# Patient Record
Sex: Male | Born: 1950 | Race: Black or African American | Hispanic: No | Marital: Single | Smoking: Current every day smoker
Health system: Southern US, Community
[De-identification: ages and names within clinical notes are randomized; demographics above are authoritative.]

## PROBLEM LIST (undated history)

## (undated) DIAGNOSIS — N189 Chronic kidney disease, unspecified: Secondary | ICD-10-CM

## (undated) DIAGNOSIS — F99 Mental disorder, not otherwise specified: Secondary | ICD-10-CM

## (undated) DIAGNOSIS — C649 Malignant neoplasm of unspecified kidney, except renal pelvis: Secondary | ICD-10-CM

## (undated) DIAGNOSIS — E119 Type 2 diabetes mellitus without complications: Secondary | ICD-10-CM

## (undated) DIAGNOSIS — I10 Essential (primary) hypertension: Secondary | ICD-10-CM

## (undated) DIAGNOSIS — Z972 Presence of dental prosthetic device (complete) (partial): Secondary | ICD-10-CM

## (undated) HISTORY — PX: KNEE SURGERY: SHX244

## (undated) HISTORY — PX: ANKLE FRACTURE SURGERY: SHX122

---

## 2003-09-04 ENCOUNTER — Other Ambulatory Visit: Payer: Self-pay

## 2005-08-27 ENCOUNTER — Emergency Department: Payer: Self-pay | Admitting: Emergency Medicine

## 2009-01-13 ENCOUNTER — Ambulatory Visit: Payer: Self-pay | Admitting: Family Medicine

## 2013-05-06 ENCOUNTER — Emergency Department: Payer: Self-pay | Admitting: Emergency Medicine

## 2013-10-26 ENCOUNTER — Emergency Department: Payer: Self-pay | Admitting: Emergency Medicine

## 2013-10-26 ENCOUNTER — Inpatient Hospital Stay: Admission: AD | Admit: 2013-10-26 | Payer: Self-pay | Source: Ambulatory Visit | Admitting: Emergency Medicine

## 2013-10-26 ENCOUNTER — Inpatient Hospital Stay (HOSPITAL_COMMUNITY)
Admission: EM | Admit: 2013-10-26 | Discharge: 2013-11-06 | DRG: 021 | Disposition: A | Discharge status: Rehabilitation Facility | Payer: Medicare Other | Source: Other Acute Inpatient Hospital | Attending: Neurosurgery | Admitting: Neurosurgery

## 2013-10-26 ENCOUNTER — Encounter (HOSPITAL_COMMUNITY): Payer: Self-pay | Admitting: *Deleted

## 2013-10-26 ENCOUNTER — Inpatient Hospital Stay (HOSPITAL_COMMUNITY): Payer: Medicare Other

## 2013-10-26 DIAGNOSIS — I609 Nontraumatic subarachnoid hemorrhage, unspecified: Principal | ICD-10-CM

## 2013-10-26 DIAGNOSIS — Y849 Medical procedure, unspecified as the cause of abnormal reaction of the patient, or of later complication, without mention of misadventure at the time of the procedure: Secondary | ICD-10-CM | POA: Diagnosis present

## 2013-10-26 DIAGNOSIS — G988 Other disorders of nervous system: Secondary | ICD-10-CM | POA: Diagnosis present

## 2013-10-26 DIAGNOSIS — I10 Essential (primary) hypertension: Secondary | ICD-10-CM | POA: Diagnosis present

## 2013-10-26 DIAGNOSIS — E119 Type 2 diabetes mellitus without complications: Secondary | ICD-10-CM | POA: Diagnosis present

## 2013-10-26 DIAGNOSIS — I671 Cerebral aneurysm, nonruptured: Secondary | ICD-10-CM | POA: Diagnosis present

## 2013-10-26 DIAGNOSIS — I129 Hypertensive chronic kidney disease with stage 1 through stage 4 chronic kidney disease, or unspecified chronic kidney disease: Secondary | ICD-10-CM | POA: Diagnosis present

## 2013-10-26 DIAGNOSIS — R51 Headache: Secondary | ICD-10-CM | POA: Diagnosis present

## 2013-10-26 DIAGNOSIS — R42 Dizziness and giddiness: Secondary | ICD-10-CM | POA: Diagnosis present

## 2013-10-26 DIAGNOSIS — E876 Hypokalemia: Secondary | ICD-10-CM | POA: Diagnosis not present

## 2013-10-26 DIAGNOSIS — R262 Difficulty in walking, not elsewhere classified: Secondary | ICD-10-CM | POA: Diagnosis present

## 2013-10-26 DIAGNOSIS — N189 Chronic kidney disease, unspecified: Secondary | ICD-10-CM | POA: Diagnosis present

## 2013-10-26 DIAGNOSIS — F259 Schizoaffective disorder, unspecified: Secondary | ICD-10-CM | POA: Diagnosis present

## 2013-10-26 DIAGNOSIS — I639 Cerebral infarction, unspecified: Secondary | ICD-10-CM

## 2013-10-26 HISTORY — DX: Mental disorder, not otherwise specified: F99

## 2013-10-26 HISTORY — DX: Type 2 diabetes mellitus without complications: E11.9

## 2013-10-26 HISTORY — DX: Chronic kidney disease, unspecified: N18.9

## 2013-10-26 HISTORY — DX: Essential (primary) hypertension: I10

## 2013-10-26 LAB — COMPREHENSIVE METABOLIC PANEL
ALBUMIN: 3.7 g/dL (ref 3.4–5.0)
ALT: 41 U/L (ref 12–78)
ANION GAP: 8 (ref 7–16)
Alkaline Phosphatase: 67 U/L
BILIRUBIN TOTAL: 0.9 mg/dL (ref 0.2–1.0)
BUN: 26 mg/dL — ABNORMAL HIGH (ref 7–18)
CO2: 31 mmol/L (ref 21–32)
Calcium, Total: 10.2 mg/dL — ABNORMAL HIGH (ref 8.5–10.1)
Chloride: 101 mmol/L (ref 98–107)
Creatinine: 2.64 mg/dL — ABNORMAL HIGH (ref 0.60–1.30)
GFR CALC AF AMER: 29 — AB
GFR CALC NON AF AMER: 25 — AB
GLUCOSE: 125 mg/dL — AB (ref 65–99)
OSMOLALITY: 286 (ref 275–301)
POTASSIUM: 2.7 mmol/L — AB (ref 3.5–5.1)
SGOT(AST): 138 U/L — ABNORMAL HIGH (ref 15–37)
Sodium: 140 mmol/L (ref 136–145)
TOTAL PROTEIN: 7.9 g/dL (ref 6.4–8.2)

## 2013-10-26 LAB — LIPASE, BLOOD: Lipase: 135 U/L (ref 73–393)

## 2013-10-26 LAB — CBC
HCT: 47.9 % (ref 40.0–52.0)
HGB: 15.6 g/dL (ref 13.0–18.0)
MCH: 30.5 pg (ref 26.0–34.0)
MCHC: 32.7 g/dL (ref 32.0–36.0)
MCV: 93 fL (ref 80–100)
Platelet: 292 10*3/uL (ref 150–440)
RBC: 5.13 10*6/uL (ref 4.40–5.90)
RDW: 15.3 % — ABNORMAL HIGH (ref 11.5–14.5)
WBC: 16.1 10*3/uL — AB (ref 3.8–10.6)

## 2013-10-26 LAB — GLUCOSE, CAPILLARY: Glucose-Capillary: 145 mg/dL — ABNORMAL HIGH (ref 70–99)

## 2013-10-26 LAB — MAGNESIUM: Magnesium: 2.1 mg/dL

## 2013-10-26 LAB — PROTIME-INR
INR: 1.1 (ref 0.00–1.49)
PROTHROMBIN TIME: 14 s (ref 11.6–15.2)

## 2013-10-26 LAB — MRSA PCR SCREENING: MRSA by PCR: NEGATIVE

## 2013-10-26 MED ORDER — LABETALOL HCL 5 MG/ML IV SOLN
10.0000 mg | INTRAVENOUS | Status: DC | PRN
Start: 2013-10-26 — End: 2013-11-06
  Administered 2013-10-27 – 2013-10-28 (×3): 20 mg via INTRAVENOUS
  Administered 2013-10-28: 10 mg via INTRAVENOUS
  Administered 2013-10-29 – 2013-11-01 (×5): 20 mg via INTRAVENOUS
  Administered 2013-11-01: 40 mg via INTRAVENOUS
  Administered 2013-11-02 – 2013-11-05 (×2): 20 mg via INTRAVENOUS
  Filled 2013-10-26: qty 8
  Filled 2013-10-26 (×2): qty 4
  Filled 2013-10-26: qty 8
  Filled 2013-10-26 (×9): qty 4
  Filled 2013-10-26: qty 8

## 2013-10-26 MED ORDER — MORPHINE SULFATE 2 MG/ML IJ SOLN
1.0000 mg | INTRAMUSCULAR | Status: DC | PRN
  Administered 2013-10-27 – 2013-10-30 (×3): 2 mg via INTRAVENOUS
  Filled 2013-10-26 (×4): qty 1

## 2013-10-26 MED ORDER — NIMODIPINE 60 MG/20ML PO SOLN
60.0000 mg | ORAL | Status: DC
  Administered 2013-10-31: 60 mg
  Filled 2013-10-26 (×40): qty 20

## 2013-10-26 MED ORDER — PANTOPRAZOLE SODIUM 40 MG IV SOLR
40.0000 mg | Freq: Every day | INTRAVENOUS | Status: DC
  Administered 2013-10-26 – 2013-10-28 (×3): 40 mg via INTRAVENOUS
  Filled 2013-10-26 (×5): qty 40

## 2013-10-26 MED ORDER — ACETAMINOPHEN 650 MG RE SUPP: 650.0000 mg | RECTAL | Status: DC | PRN

## 2013-10-26 MED ORDER — INFLUENZA VAC SPLIT QUAD 0.5 ML IM SUSP
0.5000 mL | INTRAMUSCULAR | Status: DC
  Filled 2013-10-26: qty 0.5

## 2013-10-26 MED ORDER — ACETAMINOPHEN 325 MG PO TABS
650.0000 mg | ORAL_TABLET | ORAL | Status: DC | PRN
  Administered 2013-10-27 – 2013-11-02 (×8): 650 mg via ORAL
  Filled 2013-10-26 (×8): qty 2

## 2013-10-26 MED ORDER — SENNOSIDES-DOCUSATE SODIUM 8.6-50 MG PO TABS
1.0000 | ORAL_TABLET | Freq: Two times a day (BID) | ORAL | Status: DC
  Administered 2013-10-26 – 2013-11-04 (×11): 1 via ORAL
  Filled 2013-10-26 (×23): qty 1

## 2013-10-26 MED ORDER — NIMODIPINE 30 MG PO CAPS
60.0000 mg | ORAL_CAPSULE | ORAL | Status: DC
  Administered 2013-10-26 – 2013-11-06 (×62): 60 mg via ORAL
  Filled 2013-10-26 (×70): qty 2

## 2013-10-26 MED ORDER — NICARDIPINE HCL IN NACL 20-0.86 MG/200ML-% IV SOLN
3.0000 mg/h | INTRAVENOUS | Status: DC
  Administered 2013-10-26: 5 mg/h via INTRAVENOUS
  Administered 2013-10-27: 12.5 mg/h via INTRAVENOUS
  Filled 2013-10-26 (×3): qty 200

## 2013-10-26 NOTE — H&P (Signed)
Riley Stuart is an 63 y.o. male.   Chief Complaint: transferred from [facility] Stuart HPI: patient seen at the er of Riley Stuart with the complain of headache and dizziness for 3 days. Patient denies any history of weakness, memory loss. He is fully aware of having mental disturbance and lives in a group home. At teh er, a ct head was done which showed blood in the subfrontal area and send to [place] for further treatment. Right now he is awake with no complains  No past medical history on file.  Past surgery, left knee and foot surgery No family history on file. Social History:  has no tobacco, alcohol, and drug history on file.  Allergies: none See notes from Riley Stuart  No results found for this or any previous visit (from the past 48 hour(s)). No results found.  Review of Systems  Constitutional: Negative.   Eyes: Negative.   Respiratory: Negative.   Cardiovascular: Negative.   Gastrointestinal: Negative.   Genitourinary: Negative.   Musculoskeletal: Positive for neck pain.  Skin: Negative.   Neurological: Positive for headaches.  Endo/Heme/Allergies: Negative.        Diabetes  Psychiatric/Behavioral:       Esquizophrenia    There were no vitals taken for this visit. Physical Exam hent, nl. Neck, no evidence of any stiffness. Cv, nl. Lugs, some rales. Abdomen, unable to feel any mass secondary to his size. Extremities, grade 1 edema. NEURO awake KE, ORIENTED X3. ABLE TO EXPLAIN HIS MEDIcal CONDITION. No weakness. Cn, wln Assessment/Plan Ct head done at Riley Stuart shows Riley Stuart with location between the falx with no evidence of shift. i DID SPEAK WITH Riley Stuart who will see him in am. Will get a ct angio tonite  Riley Stuart M 10/26/2013, 7:02 PM

## 2013-10-27 ENCOUNTER — Encounter (HOSPITAL_COMMUNITY): Payer: Medicare Other | Admitting: Certified Registered"

## 2013-10-27 ENCOUNTER — Encounter (HOSPITAL_COMMUNITY)
Admission: EM | Disposition: A | Discharge status: Rehabilitation Facility | Payer: Self-pay | Source: Other Acute Inpatient Hospital | Attending: Neurosurgery

## 2013-10-27 ENCOUNTER — Inpatient Hospital Stay (HOSPITAL_COMMUNITY): Payer: Medicare Other

## 2013-10-27 ENCOUNTER — Inpatient Hospital Stay (HOSPITAL_COMMUNITY): Payer: Medicare Other | Admitting: Certified Registered"

## 2013-10-27 ENCOUNTER — Encounter (HOSPITAL_COMMUNITY)
Admission: EM | Disposition: A | Discharge status: Rehabilitation Facility | Payer: Medicare Other | Source: Other Acute Inpatient Hospital | Attending: Neurosurgery

## 2013-10-27 ENCOUNTER — Ambulatory Visit (HOSPITAL_COMMUNITY)
Admission: EM | Admit: 2013-10-27 | Payer: Medicare Other | Source: Other Acute Inpatient Hospital | Admitting: Neurosurgery

## 2013-10-27 ENCOUNTER — Encounter (HOSPITAL_COMMUNITY): Payer: Self-pay | Admitting: Anesthesiology

## 2013-10-27 HISTORY — PX: CRANIOTOMY: SHX93

## 2013-10-27 LAB — GLUCOSE, CAPILLARY
GLUCOSE-CAPILLARY: 204 mg/dL — AB (ref 70–99)
GLUCOSE-CAPILLARY: 186 mg/dL — AB (ref 70–99)
Glucose-Capillary: 134 mg/dL — ABNORMAL HIGH (ref 70–99)
Glucose-Capillary: 136 mg/dL — ABNORMAL HIGH (ref 70–99)
Glucose-Capillary: 143 mg/dL — ABNORMAL HIGH (ref 70–99)
Glucose-Capillary: 146 mg/dL — ABNORMAL HIGH (ref 70–99)

## 2013-10-27 LAB — COMPREHENSIVE METABOLIC PANEL
ALT: 35 U/L (ref 0–53)
AST: 106 U/L — ABNORMAL HIGH (ref 0–37)
Albumin: 3.4 g/dL — ABNORMAL LOW (ref 3.5–5.2)
Alkaline Phosphatase: 61 U/L (ref 39–117)
BILIRUBIN TOTAL: 0.9 mg/dL (ref 0.3–1.2)
BUN: 23 mg/dL (ref 6–23)
CALCIUM: 9.6 mg/dL (ref 8.4–10.5)
CHLORIDE: 102 meq/L (ref 96–112)
CO2: 27 mEq/L (ref 19–32)
CREATININE: 1.89 mg/dL — AB (ref 0.50–1.35)
GFR calc Af Amer: 42 mL/min — ABNORMAL LOW (ref >90)
GFR, EST NON AFRICAN AMERICAN: 36 mL/min — AB (ref >90)
GLUCOSE: 149 mg/dL — AB (ref 70–99)
Potassium: 2.8 mEq/L — CL (ref 3.7–5.3)
Sodium: 147 mEq/L (ref 137–147)
Total Protein: 7 g/dL (ref 6.0–8.3)

## 2013-10-27 LAB — ABO/RH: ABO/RH(D): O NEG

## 2013-10-27 LAB — POCT I-STAT, CHEM 8
BUN: 23 mg/dL (ref 6–23)
Calcium, Ion: 1.15 mmol/L (ref 1.13–1.30)
Chloride: 103 mEq/L (ref 96–112)
Creatinine, Ser: 2 mg/dL — ABNORMAL HIGH (ref 0.50–1.35)
GLUCOSE: 184 mg/dL — AB (ref 70–99)
HCT: 47 % (ref 39.0–52.0)
HEMOGLOBIN: 16 g/dL (ref 13.0–17.0)
Potassium: 2.9 mEq/L — CL (ref 3.7–5.3)
SODIUM: 145 meq/L (ref 137–147)
TCO2: 24 mmol/L (ref 0–100)

## 2013-10-27 SURGERY — CRANIOTOMY INTRACRANIAL ANEURYSM FOR CAROTID
Anesthesia: General

## 2013-10-27 SURGERY — CRANIOTOMY INTRACRANIAL ANEURYSM FOR CAROTID
Anesthesia: General | Site: Head | Laterality: Left

## 2013-10-27 MED ORDER — SUCCINYLCHOLINE CHLORIDE 20 MG/ML IJ SOLN
INTRAMUSCULAR | Status: AC
  Filled 2013-10-27: qty 1

## 2013-10-27 MED ORDER — POTASSIUM CHLORIDE 10 MEQ/100ML IV SOLN
10.0000 meq | INTRAVENOUS | Status: AC
  Administered 2013-10-27 (×4): 10 meq via INTRAVENOUS
  Filled 2013-10-27 (×4): qty 100

## 2013-10-27 MED ORDER — SURGIFOAM 100 EX MISC
CUTANEOUS | Status: DC | PRN
  Administered 2013-10-27: 19:00:00 via TOPICAL

## 2013-10-27 MED ORDER — LIDOCAINE HCL (CARDIAC) 20 MG/ML IV SOLN
INTRAVENOUS | Status: AC
  Filled 2013-10-27: qty 5

## 2013-10-27 MED ORDER — NEOSTIGMINE METHYLSULFATE 1 MG/ML IJ SOLN
INTRAMUSCULAR | Status: AC
  Filled 2013-10-27: qty 10

## 2013-10-27 MED ORDER — DEXTROSE 5 % IV SOLN
1000.0000 mg | INTRAVENOUS | Status: DC | PRN
  Administered 2013-10-27: 1000 mg via INTRAVENOUS

## 2013-10-27 MED ORDER — SODIUM CHLORIDE 0.9 % IV SOLN
INTRAVENOUS | Status: DC
Start: 2013-10-27 — End: 2013-11-06
  Administered 2013-10-27 – 2013-10-29 (×4): via INTRAVENOUS
  Administered 2013-10-31: 75 mL/h via INTRAVENOUS
  Administered 2013-10-31 – 2013-11-06 (×8): via INTRAVENOUS

## 2013-10-27 MED ORDER — PHENYLEPHRINE 40 MCG/ML (10ML) SYRINGE FOR IV PUSH (FOR BLOOD PRESSURE SUPPORT)
PREFILLED_SYRINGE | INTRAVENOUS | Status: AC
  Filled 2013-10-27: qty 10

## 2013-10-27 MED ORDER — IOHEXOL 300 MG/ML  SOLN
150.0000 mL | Freq: Once | INTRAMUSCULAR | Status: AC | PRN
  Administered 2013-10-27: 50 mL via INTRA_ARTERIAL

## 2013-10-27 MED ORDER — PROPOFOL 10 MG/ML IV BOLUS
INTRAVENOUS | Status: AC
  Filled 2013-10-27: qty 20

## 2013-10-27 MED ORDER — ONDANSETRON HCL 4 MG/2ML IJ SOLN
INTRAMUSCULAR | Status: DC | PRN
  Administered 2013-10-27: 4 mg via INTRAVENOUS

## 2013-10-27 MED ORDER — ROCURONIUM BROMIDE 100 MG/10ML IV SOLN
INTRAVENOUS | Status: DC | PRN
  Administered 2013-10-27 (×2): 30 mg via INTRAVENOUS
  Administered 2013-10-27: 20 mg via INTRAVENOUS

## 2013-10-27 MED ORDER — ETOMIDATE 2 MG/ML IV SOLN
INTRAVENOUS | Status: AC
  Filled 2013-10-27: qty 10

## 2013-10-27 MED ORDER — FENTANYL CITRATE 0.05 MG/ML IJ SOLN
INTRAMUSCULAR | Status: AC
  Filled 2013-10-27: qty 5

## 2013-10-27 MED ORDER — STERILE WATER FOR INJECTION IJ SOLN
INTRAMUSCULAR | Status: AC
  Filled 2013-10-27: qty 10

## 2013-10-27 MED ORDER — PHENYLEPHRINE HCL 10 MG/ML IJ SOLN
INTRAMUSCULAR | Status: AC
  Filled 2013-10-27: qty 1

## 2013-10-27 MED ORDER — THROMBIN 5000 UNITS EX SOLR
OROMUCOSAL | Status: DC | PRN
  Administered 2013-10-27: 19:00:00 via TOPICAL

## 2013-10-27 MED ORDER — BACITRACIN ZINC 500 UNIT/GM EX OINT
TOPICAL_OINTMENT | CUTANEOUS | Status: DC | PRN
  Administered 2013-10-27 (×2): 1 via TOPICAL

## 2013-10-27 MED ORDER — GLYCOPYRROLATE 0.2 MG/ML IJ SOLN
INTRAMUSCULAR | Status: AC
  Filled 2013-10-27: qty 5

## 2013-10-27 MED ORDER — LIDOCAINE HCL (PF) 1 % IJ SOLN
INTRAMUSCULAR | Status: DC | PRN
  Administered 2013-10-27: 5 mL

## 2013-10-27 MED ORDER — SODIUM CHLORIDE 0.9 % IR SOLN
Status: DC | PRN
  Administered 2013-10-27: 19:00:00

## 2013-10-27 MED ORDER — NEOSTIGMINE METHYLSULFATE 1 MG/ML IJ SOLN
INTRAMUSCULAR | Status: DC | PRN
  Administered 2013-10-27: 5 mg via INTRAVENOUS

## 2013-10-27 MED ORDER — POTASSIUM CHLORIDE 10 MEQ/50ML IV SOLN
10.0000 meq | INTRAVENOUS | Status: AC
  Administered 2013-10-27 (×2): 10 meq via INTRAVENOUS
  Filled 2013-10-27 (×2): qty 50

## 2013-10-27 MED ORDER — GLYCOPYRROLATE 0.2 MG/ML IJ SOLN
INTRAMUSCULAR | Status: DC | PRN
  Administered 2013-10-27: .8 mg via INTRAVENOUS

## 2013-10-27 MED ORDER — INDOCYANINE GREEN 25 MG IV SOLR
25.0000 mg | INTRAVENOUS | Status: AC
  Filled 2013-10-27: qty 25

## 2013-10-27 MED ORDER — ONDANSETRON HCL 4 MG/2ML IJ SOLN
INTRAMUSCULAR | Status: AC
  Filled 2013-10-27: qty 2

## 2013-10-27 MED ORDER — FENTANYL CITRATE 0.05 MG/ML IJ SOLN
25.0000 ug | INTRAMUSCULAR | Status: DC | PRN
  Administered 2013-10-28: 25 ug via INTRAVENOUS
  Filled 2013-10-27: qty 2

## 2013-10-27 MED ORDER — INDOCYANINE GREEN 25 MG IV SOLR
INTRAVENOUS | Status: DC | PRN
  Administered 2013-10-27: 12.5 mg via INTRAVENOUS

## 2013-10-27 MED ORDER — ROCURONIUM BROMIDE 50 MG/5ML IV SOLN
INTRAVENOUS | Status: AC
  Filled 2013-10-27: qty 2

## 2013-10-27 MED ORDER — SODIUM CHLORIDE 0.9 % IV SOLN
1000.0000 mg | INTRAVENOUS | Status: AC
  Filled 2013-10-27: qty 10

## 2013-10-27 MED ORDER — LABETALOL HCL 5 MG/ML IV SOLN
INTRAVENOUS | Status: DC | PRN
  Administered 2013-10-27 (×4): 10 mg via INTRAVENOUS

## 2013-10-27 MED ORDER — SUCCINYLCHOLINE CHLORIDE 20 MG/ML IJ SOLN
INTRAMUSCULAR | Status: DC | PRN
  Administered 2013-10-27: 140 mg via INTRAVENOUS

## 2013-10-27 MED ORDER — FENTANYL CITRATE 0.05 MG/ML IJ SOLN
INTRAMUSCULAR | Status: DC | PRN
  Administered 2013-10-27: 25 ug via INTRAVENOUS
  Administered 2013-10-27: 100 ug via INTRAVENOUS
  Administered 2013-10-27: 50 ug via INTRAVENOUS
  Administered 2013-10-27: 100 ug via INTRAVENOUS
  Administered 2013-10-27: 75 ug via INTRAVENOUS
  Administered 2013-10-27: 150 ug via INTRAVENOUS

## 2013-10-27 MED ORDER — ETOMIDATE 2 MG/ML IV SOLN
INTRAVENOUS | Status: DC | PRN
  Administered 2013-10-27: 16 mg via INTRAVENOUS

## 2013-10-27 MED ORDER — DEXAMETHASONE SODIUM PHOSPHATE 10 MG/ML IJ SOLN
INTRAMUSCULAR | Status: DC | PRN
  Administered 2013-10-27: 10 mg via INTRAVENOUS

## 2013-10-27 MED ORDER — METOPROLOL TARTRATE 1 MG/ML IV SOLN
INTRAVENOUS | Status: DC | PRN
  Administered 2013-10-27 (×2): 1 mg via INTRAVENOUS

## 2013-10-27 MED ORDER — INSULIN ASPART 100 UNIT/ML ~~LOC~~ SOLN
0.0000 [IU] | SUBCUTANEOUS | Status: DC
Start: 2013-10-27 — End: 2013-11-04
  Administered 2013-10-27: 5 [IU] via SUBCUTANEOUS
  Administered 2013-10-28: 3 [IU] via SUBCUTANEOUS
  Administered 2013-10-28: 2 [IU] via SUBCUTANEOUS
  Administered 2013-10-28 – 2013-10-29 (×5): 3 [IU] via SUBCUTANEOUS
  Administered 2013-10-29 (×4): 2 [IU] via SUBCUTANEOUS
  Administered 2013-10-29: 3 [IU] via SUBCUTANEOUS
  Administered 2013-10-29: 2 [IU] via SUBCUTANEOUS
  Administered 2013-10-30: 3 [IU] via SUBCUTANEOUS
  Administered 2013-10-30 (×3): 2 [IU] via SUBCUTANEOUS
  Administered 2013-10-30: 3 [IU] via SUBCUTANEOUS
  Administered 2013-10-31 (×2): 2 [IU] via SUBCUTANEOUS
  Administered 2013-10-31: 3 [IU] via SUBCUTANEOUS
  Administered 2013-10-31: 2 [IU] via SUBCUTANEOUS
  Administered 2013-10-31: 3 [IU] via SUBCUTANEOUS
  Administered 2013-11-01 (×2): 2 [IU] via SUBCUTANEOUS
  Administered 2013-11-01: 3 [IU] via SUBCUTANEOUS
  Administered 2013-11-01: 2 [IU] via SUBCUTANEOUS
  Administered 2013-11-02: 3 [IU] via SUBCUTANEOUS
  Administered 2013-11-02: 2 [IU] via SUBCUTANEOUS
  Administered 2013-11-02 – 2013-11-03 (×2): 3 [IU] via SUBCUTANEOUS
  Administered 2013-11-03: 2 [IU] via SUBCUTANEOUS
  Administered 2013-11-03 (×2): 3 [IU] via SUBCUTANEOUS
  Administered 2013-11-04 (×2): 2 [IU] via SUBCUTANEOUS

## 2013-10-27 MED ORDER — BUPIVACAINE HCL 0.5 % IJ SOLN
INTRAMUSCULAR | Status: DC | PRN
  Administered 2013-10-27: 5 mL

## 2013-10-27 MED ORDER — CEFAZOLIN SODIUM 1-5 GM-% IV SOLN
INTRAVENOUS | Status: AC
  Administered 2013-10-27: 3 g via INTRAVENOUS
  Filled 2013-10-27: qty 50

## 2013-10-27 MED ORDER — PHENYLEPHRINE HCL 10 MG/ML IJ SOLN
10.0000 mg | INTRAVENOUS | Status: DC | PRN
  Administered 2013-10-27: 25 ug/min via INTRAVENOUS

## 2013-10-27 MED ORDER — MANNITOL 20 % IV SOLN
INTRAVENOUS | Status: DC | PRN
  Administered 2013-10-27: 18:00:00 via INTRAVENOUS

## 2013-10-27 MED ORDER — HEMOSTATIC AGENTS (NO CHARGE) OPTIME
TOPICAL | Status: DC | PRN
  Administered 2013-10-27: 1 via TOPICAL

## 2013-10-27 MED ORDER — 0.9 % SODIUM CHLORIDE (POUR BTL) OPTIME
TOPICAL | Status: DC | PRN
  Administered 2013-10-27 (×3): 1000 mL

## 2013-10-27 MED ORDER — ALBUTEROL SULFATE (2.5 MG/3ML) 0.083% IN NEBU
INHALATION_SOLUTION | RESPIRATORY_TRACT | Status: AC
  Administered 2013-10-27: 2.5 mg
  Filled 2013-10-27: qty 3

## 2013-10-27 MED ORDER — MIDAZOLAM HCL 5 MG/5ML IJ SOLN
INTRAMUSCULAR | Status: DC | PRN
  Administered 2013-10-27: 1 mg via INTRAVENOUS

## 2013-10-27 MED ORDER — ALBUTEROL SULFATE (2.5 MG/3ML) 0.083% IN NEBU
2.5000 mg | INHALATION_SOLUTION | Freq: Four times a day (QID) | RESPIRATORY_TRACT | Status: DC | PRN
  Administered 2013-10-27: 2.5 mg via RESPIRATORY_TRACT

## 2013-10-27 MED ORDER — PROPOFOL 10 MG/ML IV BOLUS
INTRAVENOUS | Status: DC | PRN
  Administered 2013-10-27: 70 mg via INTRAVENOUS

## 2013-10-27 SURGICAL SUPPLY — 105 items
BANDAGE GAUZE 4  KLING STR (GAUZE/BANDAGES/DRESSINGS) ×4 IMPLANT
BANDAGE GAUZE ELAST BULKY 4 IN (GAUZE/BANDAGES/DRESSINGS) IMPLANT
BENZOIN TINCTURE PRP APPL 2/3 (GAUZE/BANDAGES/DRESSINGS) IMPLANT
BIT DRILL WIRE PASS 1.3MM (BIT) IMPLANT
BLADE SAW GIGLI 16 STRL (MISCELLANEOUS) IMPLANT
BLADE SURG 15 STRL LF DISP TIS (BLADE) IMPLANT
BLADE SURG 15 STRL SS (BLADE)
BLADE ULTRA TIP 2M (BLADE) IMPLANT
BNDG GAUZE ELAST 4 BULKY (GAUZE/BANDAGES/DRESSINGS) ×4 IMPLANT
BRUSH SCRUB EZ 1% IODOPHOR (MISCELLANEOUS) IMPLANT
BRUSH SCRUB EZ PLAIN DRY (MISCELLANEOUS) IMPLANT
BUR ACORN 6.0 PRECISION (BURR) ×2 IMPLANT
BUR ADDG 1.1 (BURR) IMPLANT
BUR MATCHSTICK NEURO 3.0 LAGG (BURR) IMPLANT
BUR ROUND FLUTED 4 SOFT TCH (BURR) ×2 IMPLANT
BUR ROUTER D-58 CRANI (BURR) ×2 IMPLANT
BUR ROUTER D-59 CRANI (BURR) ×2 IMPLANT
CANISTER SUCT 3000ML (MISCELLANEOUS) ×6 IMPLANT
CLIP ANEURY TI PERM MINI 6.6M (Clip) ×2 IMPLANT
CLIP TI MEDIUM 6 (CLIP) ×2 IMPLANT
CONT SPEC 4OZ CLIKSEAL STRL BL (MISCELLANEOUS) ×4 IMPLANT
CORDS BIPOLAR (ELECTRODE) ×2 IMPLANT
COVER MAYO STAND STRL (DRAPES) IMPLANT
Clip Aneurysm TI Perm Mini 6.6mm ×2 IMPLANT
DECANTER SPIKE VIAL GLASS SM (MISCELLANEOUS) ×2 IMPLANT
DRAIN SNY WOU 7FLT (WOUND CARE) IMPLANT
DRAPE MICROSCOPE LEICA (MISCELLANEOUS) ×2 IMPLANT
DRAPE NEUROLOGICAL W/INCISE (DRAPES) ×2 IMPLANT
DRAPE WARM FLUID 44X44 (DRAPE) ×2 IMPLANT
DRESSING TELFA 8X3 (GAUZE/BANDAGES/DRESSINGS) IMPLANT
DRILL WIRE PASS 1.3MM (BIT)
DRSG ADAPTIC 3X8 NADH LF (GAUZE/BANDAGES/DRESSINGS) IMPLANT
DRSG EMULSION OIL 3X3 NADH (GAUZE/BANDAGES/DRESSINGS) ×2 IMPLANT
DURAFORM SPONGE 2X2 SINGLE (Neuro Prosthesis/Implant) ×2 IMPLANT
DURAPREP 26ML APPLICATOR (WOUND CARE) ×2 IMPLANT
DURAPREP 6ML APPLICATOR 50/CS (WOUND CARE) IMPLANT
ELECT CAUTERY BLADE 6.4 (BLADE) ×2 IMPLANT
ELECT REM PT RETURN 9FT ADLT (ELECTROSURGICAL) ×2
ELECTRODE REM PT RTRN 9FT ADLT (ELECTROSURGICAL) ×1 IMPLANT
EVACUATOR SILICONE 100CC (DRAIN) IMPLANT
FORCEPS BIPOLAR SPETZLER 8 1.0 (NEUROSURGERY SUPPLIES) ×2 IMPLANT
GAUZE SPONGE 4X4 16PLY XRAY LF (GAUZE/BANDAGES/DRESSINGS) IMPLANT
GLOVE BIO SURGEON STRL SZ 6.5 (GLOVE) ×8 IMPLANT
GLOVE BIOGEL PI IND STRL 6.5 (GLOVE) ×2 IMPLANT
GLOVE BIOGEL PI IND STRL 7.0 (GLOVE) ×1 IMPLANT
GLOVE BIOGEL PI IND STRL 7.5 (GLOVE) ×1 IMPLANT
GLOVE BIOGEL PI INDICATOR 6.5 (GLOVE) ×2
GLOVE BIOGEL PI INDICATOR 7.0 (GLOVE) ×1
GLOVE BIOGEL PI INDICATOR 7.5 (GLOVE) ×1
GLOVE ECLIPSE 7.0 STRL STRAW (GLOVE) ×4 IMPLANT
GLOVE EXAM NITRILE LRG STRL (GLOVE) IMPLANT
GLOVE EXAM NITRILE MD LF STRL (GLOVE) IMPLANT
GLOVE EXAM NITRILE XL STR (GLOVE) IMPLANT
GLOVE EXAM NITRILE XS STR PU (GLOVE) IMPLANT
GLOVE SS BIOGEL STRL SZ 6.5 (GLOVE) ×2 IMPLANT
GLOVE SUPERSENSE BIOGEL SZ 6.5 (GLOVE) ×2
GOWN BRE IMP SLV AUR LG STRL (GOWN DISPOSABLE) IMPLANT
GOWN BRE IMP SLV AUR XL STRL (GOWN DISPOSABLE) IMPLANT
GOWN STRL REIN 2XL LVL4 (GOWN DISPOSABLE) IMPLANT
GOWN STRL REUS W/ TWL LRG LVL3 (GOWN DISPOSABLE) ×4 IMPLANT
GOWN STRL REUS W/TWL LRG LVL3 (GOWN DISPOSABLE) ×4
HEMOSTAT POWDER KIT SURGIFOAM (HEMOSTASIS) ×2 IMPLANT
HEMOSTAT SURGICEL 2X14 (HEMOSTASIS) ×2 IMPLANT
HOOK DURA (MISCELLANEOUS) ×2 IMPLANT
KIT BASIN OR (CUSTOM PROCEDURE TRAY) ×2 IMPLANT
KIT DRAIN CSF ACCUDRAIN (MISCELLANEOUS) IMPLANT
KIT ROOM TURNOVER OR (KITS) ×2 IMPLANT
KNIFE ARACHNOID DISP AM-24-S (MISCELLANEOUS) ×2 IMPLANT
NEEDLE HYPO 25X1 1.5 SAFETY (NEEDLE) ×2 IMPLANT
NS IRRIG 1000ML POUR BTL (IV SOLUTION) ×6 IMPLANT
PACK CRANIOTOMY (CUSTOM PROCEDURE TRAY) ×2 IMPLANT
PAD ARMBOARD 7.5X6 YLW CONV (MISCELLANEOUS) ×6 IMPLANT
PATTIES SURGICAL .25X.25 (GAUZE/BANDAGES/DRESSINGS) IMPLANT
PATTIES SURGICAL .5 X.5 (GAUZE/BANDAGES/DRESSINGS) IMPLANT
PATTIES SURGICAL .5 X3 (DISPOSABLE) IMPLANT
PATTIES SURGICAL 1/4 X 3 (GAUZE/BANDAGES/DRESSINGS) IMPLANT
PATTIES SURGICAL 1X1 (DISPOSABLE) IMPLANT
PIN MAYFIELD SKULL DISP (PIN) ×2 IMPLANT
PLATE 1.5  2HOLE LNG NEURO (Plate) ×4 IMPLANT
PLATE 1.5 2HOLE LNG NEURO (Plate) ×4 IMPLANT
RUBBERBAND STERILE (MISCELLANEOUS) ×4 IMPLANT
SCREW SELF DRILL HT 1.5/4MM (Screw) ×16 IMPLANT
SPONGE GAUZE 4X4 12PLY (GAUZE/BANDAGES/DRESSINGS) ×2 IMPLANT
SPONGE NEURO XRAY DETECT 1X3 (DISPOSABLE) IMPLANT
SPONGE SURGIFOAM ABS GEL 100 (HEMOSTASIS) ×2 IMPLANT
SPONGE SURGIFOAM ABS GEL 100C (HEMOSTASIS) IMPLANT
STAPLER VISISTAT 35W (STAPLE) ×2 IMPLANT
STOCKINETTE 6  STRL (DRAPES) ×1
STOCKINETTE 6 STRL (DRAPES) ×1 IMPLANT
SUT ETHILON 3 0 FSL (SUTURE) IMPLANT
SUT NURALON 4 0 TR CR/8 (SUTURE) ×4 IMPLANT
SUT VIC AB 0 CT1 18XCR BRD8 (SUTURE) ×1 IMPLANT
SUT VIC AB 0 CT1 8-18 (SUTURE) ×1
SUT VIC AB 2-0 CT2 18 VCP726D (SUTURE) ×4 IMPLANT
SUT VIC AB 3-0 SH 8-18 (SUTURE) ×6 IMPLANT
SYR 20ML ECCENTRIC (SYRINGE) ×2 IMPLANT
SYR CONTROL 10ML LL (SYRINGE) ×2 IMPLANT
TAPE SURG TRANSPORE 1 IN (GAUZE/BANDAGES/DRESSINGS) ×1 IMPLANT
TAPE SURGICAL TRANSPORE 1 IN (GAUZE/BANDAGES/DRESSINGS) ×1
TOWEL OR 17X24 6PK STRL BLUE (TOWEL DISPOSABLE) ×2 IMPLANT
TOWEL OR 17X26 10 PK STRL BLUE (TOWEL DISPOSABLE) ×2 IMPLANT
TRAY FOLEY CATH 14FRSI W/METER (CATHETERS) IMPLANT
TRAY FOLEY CATH 16FRSI W/METER (SET/KITS/TRAYS/PACK) ×2 IMPLANT
UNDERPAD 30X30 INCONTINENT (UNDERPADS AND DIAPERS) IMPLANT
WATER STERILE IRR 1000ML POUR (IV SOLUTION) ×2 IMPLANT

## 2013-10-27 NOTE — Anesthesia Preprocedure Evaluation (Addendum)
Anesthesia Evaluation  Patient identified by MRN, date of birth, ID band Patient awake  General Assessment Comment:History obtained form surgeon and family. CE  Reviewed: Allergy & Precautions, H&P , NPO status , Patient's Chart, lab work & pertinent test results  History of Anesthesia Complications Negative for: history of anesthetic complications  Airway Mallampati: I TM Distance: >3 FB Neck ROM: Full    Dental  (+) Missing Multiple missing, remaining are well seated per patient :   Pulmonary  breath sounds clear to auscultation        Cardiovascular hypertension, Pt. on medications and Pt. on home beta blockers Rhythm:Regular Rate:Normal     Neuro/Psych Schizophrenia controlled on Geodon SAH with headache, no LOC or focal neuro deficit. BP control with Cardene and Nimodipine for vasospasm prophylaxis     GI/Hepatic negative GI ROS, Neg liver ROS,   Endo/Other  diabetes, Type 2, Insulin Dependent  Renal/GU Renal InsufficiencyRenal disease     Musculoskeletal negative musculoskeletal ROS (+)   Abdominal   Peds  Hematology negative hematology ROS (+)   Anesthesia Other Findings   Reproductive/Obstetrics                          Anesthesia Physical Anesthesia Plan  ASA: IV  Anesthesia Plan: General   Post-op Pain Management:    Induction: Intravenous  Airway Management Planned: Oral ETT  Additional Equipment: Arterial line  Intra-op Plan:   Post-operative Plan: Post-operative intubation/ventilation  Informed Consent: I have reviewed the patients History and Physical, chart, labs and discussed the procedure including the risks, benefits and alternatives for the proposed anesthesia with the patient or authorized representative who has indicated his/her understanding and acceptance.   Dental advisory given  Plan Discussed with: CRNA, Surgeon and Anesthesiologist  Anesthesia Plan  Comments:      Anesthesia Quick Evaluation

## 2013-10-27 NOTE — Anesthesia Postprocedure Evaluation (Signed)
  Anesthesia Post-op Note  Patient: Riley Stuart  Procedure(s) Performed: Procedure(s): Craniotomy for Aneurysm Clipping (Left)  Patient Location: PACU  Anesthesia Type:General  Level of Consciousness: awake, patient cooperative, lethargic and responds to stimulation  Airway and Oxygen Therapy: Patient Spontanous Breathing and Patient connected to face mask oxygen  Post-op Pain: none  Post-op Assessment: Post-op Vital signs reviewed, Patient's Cardiovascular Status Stable, Respiratory Function Stable, Patent Airway, No signs of Nausea or vomiting and Pain level controlled  Post-op Vital Signs: Reviewed and stable  Complications: No apparent anesthesia complications

## 2013-10-27 NOTE — Op Note (Signed)
PREOP DX: SAH  POSTOP DX: Same  PROCEDURE: Diagnostic cerebral angiogram  SURGEON: Dr. Consuella Lose, MD  ANESTHESIA: IV Sedation with Local  EBL: Minimal  SPECIMENS: None  COMPLICATIONS: None  CONDITION: Stable to recovery  FINDINGS: 1. 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 No other aneurysms/avm/fistulas seen 3. No significant vasospasm seen 4. Hemostasis with 5Fr ExoSeal in RCFA

## 2013-10-27 NOTE — Progress Notes (Signed)
Report given to Kouts at bedside. Pt taken to procedure by CRNA and radiology tech. Family met Dr. Oletta Lamas and is with patient.

## 2013-10-27 NOTE — Progress Notes (Signed)
Patient ID: Riley Stuart, male   DOB: 1951/04/26, 63 y.o.   MRN: [id number] Neuro stable. Npo . For cerebral angio today

## 2013-10-27 NOTE — Progress Notes (Signed)
UR completed.  Alexanderia Gorby, RN BSN MHA CCM Trauma/Neuro ICU Case Manager [phone]  

## 2013-10-27 NOTE — Progress Notes (Signed)
Critical Potassuim of 2.8 received and called to Dr. Kathyrn Sheriff. Orders received to give 6 runs of IV potassium of 10MeQ each to =60MeQ potassium.

## 2013-10-27 NOTE — Progress Notes (Signed)
Nutrition Brief Note  Patient identified on the Malnutrition Screening Tool (MST) Report  Pt with hx of developmental delay and lives at a group home. Per pt he has lost a few pound prior to admission due to increase in exercise and better diet choices. Pt with good appetite. Please re-consult as needed.   Wt Readings from Last 15 Encounters:  10/26/13 269 lb 2.9 oz (122.1 kg)  10/26/13 269 lb 2.9 oz (122.1 kg)    Body mass index is 34.55 kg/(m^2). Patient meets criteria for Obesity class I based on current BMI.   Current diet order is NPO for surgery. Labs and medications reviewed. Pt with low potassium which is being repleted.   No nutrition interventions warranted at this time. If nutrition issues arise, please consult RD.   [place], [place], [place] Pager [phone] After Hours Pager

## 2013-10-27 NOTE — Progress Notes (Signed)
SLP Cancellation Note  Patient Details Name: Riley Stuart MRN: [id number] DOB: 1950/09/08   Cancelled treatment:       Reason Eval/Treat Not Completed: Patient for procedure today, will defer evaluation until tomorrow.    Elzia Hott, Katherene Ponto 10/27/2013, 7:37 AM

## 2013-10-27 NOTE — Progress Notes (Signed)
At 11:30, pts BP was elevated at 193/102. Of note, family and staff were present discussing plan of care. I restarted cardene gtt, have titrated up to 10mg /hr, given labetelol as well as limited visitation. As of 12:45, BP 147/79. Will continue to monitor closely.

## 2013-10-27 NOTE — Anesthesia Procedure Notes (Signed)
Procedures RIJ CVP Dual Lumen 1735-1750: The patient was identified and consent obtained.  TO was performed, and full barrier precautions were used.  The skin was anesthetized with lidocaine.  Once the vein was located with the 22 ga. needle using ultrasound guidance , the wire was inserted into the vein.  The wire location was confirmed with ultrasound.  The tissue was dilated and the catheter was carefully inserted, then sutured in place. A dressing was applied. The patient tolerated the procedure well.   CE

## 2013-10-27 NOTE — Preoperative (Signed)
Beta Blockers   Reason not to administer Beta Blockers:Not Applicable 

## 2013-10-27 NOTE — Op Note (Signed)
PREOP DIAGNOSIS: Anterior communicating artery aneurysm  POSTOP DIAGNOSIS: Same  PROCEDURE: 1. Left fronto-temporal craniotomy for clipping of Acom aneurysm 2. Use of operating microscope for microdissection 3. Intraoperative ICG videoangiography   SURGEON: Dr. Consuella Lose, MD  ASSISTANT: Dr. Cooper Render. Pool, MD  ANESTHESIA: General Endotracheal  EBL: 500cc  SPECIMENS: None  DRAINS: None  COMPLICATIONS: None immediate  CONDITION: Hemodynamically stable to ICU  HISTORY: Riley Stuart is a 63 y.o. male who initially presented to an outside hospital with sudden onset of severe headache and dizziness. Workup included CT scan which demonstrated diffuse subarachnoid hemorrhage with thick interhemispheric clot. The patient was transferred to [facility] and diagnostic cerebral angiogram demonstrated an approximately 3 mm anterior indicating artery aneurysm with a dominant left A1. Aneurysm morphology was wide-based precluding coil embolization and the patient therefore presents for surgical clipping. The risks and benefits of the surgery were explained to the patient and his family. Verbal and written consent was obtained.  PROCEDURE IN DETAIL: After informed consent was obtained and witnessed, the patient was brought to the operating room. After induction of general anesthesia, the patient was positioned on the operative table in the supine position. The 3-point Mayfield head holder was then applied to the patient and he was positioned on the operative table with all pressure points meticulously padded. Skin incision was then marked out and prepped and draped in the usual sterile fashion.  After time-out was conducted, skin incision was made sharply and Bovie electrocautery was used to dissect the subcutaneous tissue and galea. Raney clips were then used to secure hemostasis on the skin edges. The superficial temporal artery was dissected free and retracted with the skin flap.  Bovie electrocautery was used to dissect through the pericranium as well as the temporalis fascia and muscle. The deep temporal fascia was then identified and the skin flap was retracted anteriorly. The temporalis muscle was then elevated in the subperiosteal plane and retracted inferiorly.  Bur holes were then created in the pterion, above the root of the zygoma, and the superior temporal line. These are then connected with the craniotome and a standard pterional craniotomy flap was elevated. Hemostasis was achieved on the bone edges, and a high-speed drill was used to drill down the lesser wing of the sphenoid.  The dura was then opened in cruciate fashion and good hemostasis was achieved on the dural edges. At this point the microscope was draped and brought into the field and the remainder of the case was done under the microscope using microdissection.  The optic nerve and the optico-carotid cistern were then opened and dissection was carried laterally until the internal carotid artery was identified. Dissection was carried out above the optic nerve until the left A1 was identified. This was then traced into the interhemispheric fissure, and further arachnoid dissection identified the left and right A2 segment. The ipsilateral recurrent artery of Heubner was identified and preserved. Bipolar electrocautery and suction were used to then resect a small portion of the left gyrus rectus. A small aneurysm was seen arising in the area between the take off of the bilateral A2 segments. This is projecting towards the contralateral hemisphere. The curved aneurysm clip was then selected and placed across the neck of the aneurysm. Post clip inspection demonstrated apparent patency of the left A1 bilateral A2's and return artery a few. Our left subfrontal approach precluded direct visualization of the distal clip blades. A mirror was therefore placed underneath the anterior communicating segment, and the  distal clip  blades were visualized. There did not appear to be any residual aneurysm remaining. At this point, the anesthesia service administered a bolus of ICG confirming patency of the left A1, recurrent artery of Heubner, and bilateral A2's.  At this point the wound is irrigated with copious amounts of normal saline irrigation. Good hemostasis was confirmed on the brain surface. The dura was then closed using a combination of interrupted and continuous 4-0 Nurolon stitches. A small piece of DuraGen was then placed over the dural surface suture line. The exposed left frontal sinus was covered with a large piece of Gelfoam. The bone flap was then replaced and secured using standard titanium plates and screws. Muscle was then closed using interrupted 0 Vicryl stitches, and the galea was closed using interrupted 3-0 Vicryl sutures. The skin was closed using standard surgical skin staples. Sterile dressing was then applied after the Mayfield head holder was removed. The patient was then transferred to the stretcher and taken to the neurointensive care unit in stable hemodynamic condition.  At the end of the case all sponge, needle, instrument and cottonoid counts were correct.

## 2013-10-27 NOTE — Transfer of Care (Signed)
Immediate Anesthesia Transfer of Care Note  Patient: Riley Stuart  Procedure(s) Performed: Procedure(s): Craniotomy for Aneurysm Clipping (Left)  Patient Location: PACU and NICU  Anesthesia Type:General  Level of Consciousness: sedated and patient cooperative  Airway & Oxygen Therapy: Patient connected to face mask oxygen  Post-op Assessment: Report given to PACU RN and Post -op Vital signs reviewed and stable  Post vital signs: Reviewed and stable  Complications: No apparent anesthesia complications

## 2013-10-27 NOTE — Progress Notes (Signed)
PT Cancellation Note  Patient Details Name: Riley Stuart MRN: [id number] DOB: [birth year]   Cancelled Treatment:    Reason Eval/Treat Not Completed: Patient not medically ready. Pt on strict bed rest and for planned cerebral arteriogram. PT to return as able/appropriate.   Refugio Mcconico, Knute Neu 10/27/2013, 9:16 AM

## 2013-10-27 NOTE — Progress Notes (Signed)
OT Cancellation Note  Patient Details Name: Riley Stuart MRN: [id number] DOB: July 13, 1951   Cancelled Treatment:    Reason Eval/Treat Not Completed: Medical issues which prohibited therapy;Patient at procedure or test/ unavailable  [facility], OTR/L  [id number] 10/27/2013 10/27/2013, 12:05 PM

## 2013-10-27 NOTE — Progress Notes (Signed)
Pts hx began on Friday when he had sudden onset of HA, 7/10 in severity. He had associated dizziness and difficulty walking due to balance problem. He has never had similar episodes before. This did not get any better and he presented to the [facility] ED yesterday. CT demonstrated SAH and he was transferred to [facility].  Currently he does cont to c/o some HA. He denies any visual changes or N/T/W.  Medical/Surgical history reviewed, significant for HTN and DM, and schizoaffective disorder.  EXAM:  BP 125/68  Pulse 70  Temp(Src) 98.7 F (37.1 C) (Oral)  Resp 16  Ht 6\' 2"  (1.88 m)  Wt 122.1 kg (269 lb 2.9 oz)  BMI 34.55 kg/m2  SpO2 93%  Awake, alert, oriented  Speech fluent, appropriate  CN grossly intact  5/5 BUE/BLE   IMAGING: CTH reviewed demonstrating thick interhemispheric clot with primarily left sylvian SAH. There is primarily right-sided orbitofrontal edema. No IVH or HCP.  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 I explained to them that intracranial aneurysm was the most common non-traumatic cause for [facility] and that the definitive diagnosis is made by diagnostic angiogram. I also explained to them the possible treatment options for intracranial aneurysms including endovascular coiling and open clip ligation. The risks of the angiogram, coiling, and surgical clipping were also reviewed to include stroke and aneurysm re-rupture leading to weakness/paralysis/coma/death, infection, SZ, hydrocephalus.   The patient and his family understood our discussion and the provided consent to proceed with diagnostic angiogram and the appropriate treatment for any identified aneurysm.

## 2013-10-27 NOTE — Progress Notes (Signed)
Stat CMET from > 1 hr ago still not drawn. Per phlebotomy ([place]), no order seen on their system. Stat CMET reordered.

## 2013-10-28 ENCOUNTER — Encounter (HOSPITAL_COMMUNITY): Payer: Self-pay | Admitting: *Deleted

## 2013-10-28 LAB — POCT I-STAT 7, (LYTES, BLD GAS, ICA,H+H)
ACID-BASE EXCESS: 1 mmol/L (ref 0.0–2.0)
Acid-Base Excess: 2 mmol/L (ref 0.0–2.0)
BICARBONATE: 22.7 meq/L (ref 20.0–24.0)
Bicarbonate: 25.2 mEq/L — ABNORMAL HIGH (ref 20.0–24.0)
CALCIUM ION: 1.07 mmol/L — AB (ref 1.13–1.30)
Calcium, Ion: 1.16 mmol/L (ref 1.13–1.30)
HCT: 45 % (ref 39.0–52.0)
HEMATOCRIT: 38 % — AB (ref 39.0–52.0)
Hemoglobin: 15.3 g/dL (ref 13.0–17.0)
Hemoglobin: 12.9 g/dL — ABNORMAL LOW (ref 13.0–17.0)
O2 SAT: 95 %
O2 SAT: 99 %
PCO2 ART: 33.3 mmHg — AB (ref 35.0–45.0)
PCO2 ART: 26.7 mmHg — AB (ref 35.0–45.0)
POTASSIUM: 3.2 meq/L — AB (ref 3.7–5.3)
Potassium: 3.1 mEq/L — ABNORMAL LOW (ref 3.7–5.3)
Sodium: 142 mEq/L (ref 137–147)
Sodium: 141 mEq/L (ref 137–147)
TCO2: 26 mmol/L (ref 0–100)
TCO2: 24 mmol/L (ref 0–100)
pH, Arterial: 7.485 — ABNORMAL HIGH (ref 7.350–7.450)
pH, Arterial: 7.535 — ABNORMAL HIGH (ref 7.350–7.450)
pO2, Arterial: 66 mmHg — ABNORMAL LOW (ref 80.0–100.0)
pO2, Arterial: 135 mmHg — ABNORMAL HIGH (ref 80.0–100.0)

## 2013-10-28 LAB — GLUCOSE, CAPILLARY
GLUCOSE-CAPILLARY: 165 mg/dL — AB (ref 70–99)
Glucose-Capillary: 179 mg/dL — ABNORMAL HIGH (ref 70–99)
Glucose-Capillary: 134 mg/dL — ABNORMAL HIGH (ref 70–99)
Glucose-Capillary: 169 mg/dL — ABNORMAL HIGH (ref 70–99)
Glucose-Capillary: 160 mg/dL — ABNORMAL HIGH (ref 70–99)

## 2013-10-28 LAB — POCT I-STAT 3, ART BLOOD GAS (G3+)
Acid-base deficit: 2 mmol/L (ref 0.0–2.0)
Bicarbonate: 24.3 mEq/L — ABNORMAL HIGH (ref 20.0–24.0)
O2 SAT: 96 %
TCO2: 26 mmol/L (ref 0–100)
pCO2 arterial: 45.4 mmHg — ABNORMAL HIGH (ref 35.0–45.0)
pH, Arterial: 7.337 — ABNORMAL LOW (ref 7.350–7.450)
pO2, Arterial: 85 mmHg (ref 80.0–100.0)

## 2013-10-28 LAB — POCT I-STAT GLUCOSE
Glucose, Bld: 184 mg/dL — ABNORMAL HIGH (ref 70–99)
OPERATOR ID: 132841

## 2013-10-28 MED ORDER — ALBUTEROL SULFATE (2.5 MG/3ML) 0.083% IN NEBU
2.5000 mg | INHALATION_SOLUTION | Freq: Once | RESPIRATORY_TRACT | Status: AC
  Administered 2013-10-28: 2.5 mg via RESPIRATORY_TRACT

## 2013-10-28 NOTE — Progress Notes (Signed)
SLP Cancellation Note  Patient Details Name: Riley Stuart MRN: [id number] DOB: 08/18/1951   Cancelled treatment:        Attempted to see patient for Speech Evaluation.  Patient currently refuses due to c/o headache, "an 8."  RN notified.  SLP will return 3/5.   Quinn Axe T 10/28/2013, 11:29 AM

## 2013-10-28 NOTE — Clinical Social Work Note (Signed)
Clinical Social Work Department BRIEF PSYCHOSOCIAL ASSESSMENT 10/28/2013  Patient:  Riley Stuart, Riley Stuart     Account Number:  [id number]     Admit date:  10/26/2013  Clinical Social Worker:  Myles Lipps  Date/Time:  10/28/2013 10:15 AM  Referred by:  RN  Date Referred:  10/28/2013 Referred for  Other - See comment   Other Referral:   From [place] type:  Patient Other interview type:   Spoke with facility representative [name]) who is in agreement with patient return    PSYCHOSOCIAL DATA Living Status:  FACILITY Admitted from facility:  OTHER Level of care:  Group Home Primary support name:  Riley Stuart, Riley Stuart  [phone] / [phone] Primary support relationship to patient:  [place] Degree of support available:   Strong    CURRENT CONCERNS Current Concerns  Post-Acute Placement   Other Concerns:    SOCIAL WORK ASSESSMENT / PLAN Clinical Social Worker met with patient at bedside to offer support and discuss patient needs at discharge.  Patient states that he has been a resident at [facility] for the last 15 years and would like to return once medically ready.  CSW spoke with PT who states that she has communicated with facility who is able to meet patient current needs.  CSW spoke with facility representative [name]) who confirmed that patient can return once medically ready.  CSW to communicate with CM regarding the possibility of home health.  CSW to complete FL2 and update medications at discharge.  CSW remains available for support and to facilitate patient discharge needs once medically appropriate.   Assessment/plan status:  Psychosocial Support/Ongoing Assessment of Needs Other assessment/ plan:   Information/referral to community resources:   Clinical Social Worker offered patient and facility resources for continued care - facility open to idea of home health if needed at discharge.    PATIENT'S/FAMILY'S RESPONSE TO PLAN  OF CARE: Patient alert and oriented x3 sitting up in bed working with therapies.  Patient states that he lives at [facility] and has been living there for the last 15 years with 5 other residents.  Patient with good family support who are involved with the group home as well.  Patient and facility agreeable with return to facility.  Patient and facility verbalized their appreciation for CSW support and involvement.

## 2013-10-28 NOTE — Clinical Documentation Improvement (Signed)
Possible Clinical Conditions?   "  Hypokalemia  "  Other Condition "  Cannot Clinically Determine    Diagnostics: 3/03: potassium: 2.8 3/03: potassium: 2.9  Treatment: 3/03: Order for potassium chloride 10 meq in 100 ml IVPB x 6 runs.  Thank You, Theron Arista, Clinical Documentation Specialist:  [phone]  [facility] Information [employer]

## 2013-10-28 NOTE — Progress Notes (Signed)
OT Cancellation Note  Patient Details Name: Riley Stuart MRN: [id number] DOB: [birth year]   Cancelled Treatment:    Reason Eval/Treat Not Completed: Other (comment) (being cleaned. will return this pm if able or see in am.)  [facility], OTR/L  [id number] 10/28/2013 10/28/2013, 5:36 PM

## 2013-10-28 NOTE — Evaluation (Signed)
Physical Therapy Evaluation Patient Details Name: Riley Stuart MRN: [id number] DOB: July 11, 1951 Today's Date: 10/28/2013 Time: 0955-1030 PT Time Calculation (min): 35 min  PT Assessment / Plan / Recommendation History of Present Illness  Pt with SAH now POD#1 s/p clipping ACOM ANeurysm  Clinical Impression  Pt currently requiring assist for all mobility and adls for safety. Spoke with [name], the director of the group home, who reports they have the staff to provide physical assist and w/c for pt to use until HHPT can help pt progress to supervision level of function. Pt safe to d/c back to group home once medically stable.     PT Assessment  Patient needs continued PT services    Follow Up Recommendations  Home health PT;Supervision/Assistance - 24 hour    Does the patient have the potential to tolerate intense rehabilitation      Barriers to Discharge        Equipment Recommendations  Rolling walker with 5" wheels (facility reports they have a w/c for use)    Recommendations for Other Services     Frequency Min 4X/week    Precautions / Restrictions Precautions Precautions: Fall Precaution Comments: pt with known psychosis Restrictions Weight Bearing Restrictions: No   Pertinent Vitals/Pain Reports of HA but did not rate      Mobility  Bed Mobility Overal bed mobility: Needs Assistance Bed Mobility: Supine to Sit Supine to sit: Mod assist;Max assist;HOB elevated General bed mobility comments: pt able to move LEs off bed, assist for trunk elevation and to bring hips to EOB Transfers Overall transfer level: Needs assistance Equipment used: 2 person hand held assist Transfers: Sit to/from [facility] Sit to Stand: Min assist Stand pivot transfers: Min assist;+2 physical assistance General transfer comment: pt with with decreased foot clearance and strong desire to hold onto something with both hands. Pt with shuffling step pattern and very  guarded. 2nd person helpful for lines. suspect pt would do better with RW to advance ambulation Ambulation/Gait Ambulation/Gait assistance:  (didn't assess this date) Modified Rankin (Stroke Patients Only) Pre-Morbid Rankin Score: No significant disability Modified Rankin: Moderately severe disability    Exercises     PT Diagnosis: Difficulty walking;Generalized weakness  PT Problem List: Decreased strength;Decreased activity tolerance;Decreased balance;Decreased mobility PT Treatment Interventions: DME instruction;Gait training;Functional mobility training;Therapeutic activities;Therapeutic exercise     PT Goals(Current goals can be found in the care plan section) Acute Rehab PT Goals Patient Stated Goal: home PT Goal Formulation: With patient Time For Goal Achievement: 11/04/13 Potential to Achieve Goals: Good  Visit Information  Last PT Received On: 10/28/13 Assistance Needed: +1 (2nd person for lines) History of Present Illness: Pt with SAH now POD#1 s/p clipping ACOM ANeurysm       Prior Functioning  Home Living Family/patient expects to be discharged to:: Group home Additional Comments: spoke with [name] From [facility] group home who reports they can provide minimal physical assist to patient and they do have a w/c that pt can use until pt safe to ambulate. Staff present to assist with ADLs, meal prep and medical management. Prior Function Level of Independence: Needs assistance Gait / Transfers Assistance Needed: pt was amb independently ADL's / Homemaking Assistance Needed: pt reports indep Comments: staff provided meals and medications Communication Communication: No difficulties Dominant Hand: Right    Cognition  Cognition Arousal/Alertness: Awake/alert Behavior During Therapy: WFL for tasks assessed/performed Overall Cognitive Status: History of cognitive impairments - at baseline    Extremity/Trunk Assessment Upper Extremity Assessment  Upper Extremity  Assessment: Generalized weakness Lower Extremity Assessment Lower Extremity Assessment: Generalized weakness Cervical / Trunk Assessment Cervical / Trunk Assessment: Normal   Balance Balance Overall balance assessment: Needs assistance Sitting-balance support: Feet supported;Bilateral upper extremity supported Sitting balance-Leahy Scale: Poor Sitting balance - Comments: pt with R lateral and posterior bias Postural control: Right lateral lean;Posterior lean Standing balance support: Bilateral upper extremity supported Standing balance-Leahy Scale: Poor  End of Session PT - End of Session Equipment Utilized During Treatment: Gait belt Activity Tolerance: Patient tolerated treatment well Patient left: in chair;with call bell/phone within reach Nurse Communication: Mobility status  GP     Kingsley Callander 10/28/2013, 12:17 PM   Kittie Plater, PT, DPT Pager #: [phone] Office #: [phone]

## 2013-10-28 NOTE — Progress Notes (Addendum)
Pt seen and examined. No issues overnight. Pt c/o mild HA but otherwise has no c/o. Denies any visual changes, N/T/W.  EXAM: Temp:  [98.8 F (37.1 C)-101.9 F (38.8 C)] 99.9 F (37.7 C) (03/04 0800) Pulse Rate:  [53-94] 53 (03/04 0700) Resp:  [11-27] 23 (03/04 0700) BP: (88-193)/(44-102) 141/80 mmHg (03/04 0700) SpO2:  [90 %-99 %] 96 % (03/04 0700) Arterial Line BP: (80-181)/(56-80) 91/71 mmHg (03/04 0700) Intake/Output     03/03 0701 - 03/04 0700 03/04 0701 - 03/05 0700   I.V. (mL/kg) 3194.3 (26.2) 150 (1.2)   Other 1300 380   IV Piggyback 650    Total Intake(mL/kg) 5144.3 (42.1) 530 (4.3)   Urine (mL/kg/hr) 3570 (1.2)    Blood 130 (0)    Total Output 3700     Net +1444.3 +530         Awake, alert, oriented Speech fluent CN intact No drift, good strength throughout  LABS: Lab Results  Component Value Date   CREATININE 2.00* 10/27/2013   BUN 23 10/27/2013   NA 141 10/27/2013   K 3.1* 10/27/2013   CL 103 10/27/2013   CO2 27 10/27/2013   Lab Results  Component Value Date   HGB 12.9* 10/27/2013   HCT 38.0* 10/27/2013    IMAGING: No new imaging  IMPRESSION: - 63 y.o. male SAH d#5, POD# 1 s/p clipping Acom aneurysm, neurologically at baseline - Hypokalemia - Chronic renal insufficiency likely sec to HTN/DM  PLAN: - Cont close observation - Spasm monitoring - TCD today. Cont Nimotop, Zocor - Routine postop care - Goal SBP < 151mmHg - ADAT - Mobilize today - Monitor K, cont IVF hydration - routine CBG monitoring, SSI

## 2013-10-29 LAB — BASIC METABOLIC PANEL
BUN: 17 mg/dL (ref 6–23)
CHLORIDE: 103 meq/L (ref 96–112)
CO2: 25 mEq/L (ref 19–32)
Calcium: 8.5 mg/dL (ref 8.4–10.5)
Creatinine, Ser: 1.45 mg/dL — ABNORMAL HIGH (ref 0.50–1.35)
GFR calc Af Amer: 58 mL/min — ABNORMAL LOW (ref >90)
GFR calc non Af Amer: 50 mL/min — ABNORMAL LOW (ref >90)
Glucose, Bld: 229 mg/dL — ABNORMAL HIGH (ref 70–99)
Potassium: 3.5 mEq/L — ABNORMAL LOW (ref 3.7–5.3)
Sodium: 141 mEq/L (ref 137–147)

## 2013-10-29 LAB — GLUCOSE, CAPILLARY
GLUCOSE-CAPILLARY: 151 mg/dL — AB (ref 70–99)
Glucose-Capillary: 167 mg/dL — ABNORMAL HIGH (ref 70–99)
Glucose-Capillary: 144 mg/dL — ABNORMAL HIGH (ref 70–99)
Glucose-Capillary: 148 mg/dL — ABNORMAL HIGH (ref 70–99)
Glucose-Capillary: 147 mg/dL — ABNORMAL HIGH (ref 70–99)
Glucose-Capillary: 150 mg/dL — ABNORMAL HIGH (ref 70–99)

## 2013-10-29 LAB — CBC
HCT: 34.6 % — ABNORMAL LOW (ref 39.0–52.0)
Hemoglobin: 12.2 g/dL — ABNORMAL LOW (ref 13.0–17.0)
MCH: 31.4 pg (ref 26.0–34.0)
MCHC: 35.3 g/dL (ref 30.0–36.0)
MCV: 88.9 fL (ref 78.0–100.0)
Platelets: 264 10*3/uL (ref 150–400)
RBC: 3.89 MIL/uL — AB (ref 4.22–5.81)
RDW: 14.4 % (ref 11.5–15.5)
WBC: 14.5 10*3/uL — AB (ref 4.0–10.5)

## 2013-10-29 MED ORDER — SIMVASTATIN 80 MG PO TABS
80.0000 mg | ORAL_TABLET | Freq: Every day | ORAL | Status: DC
  Administered 2013-10-29 – 2013-11-05 (×8): 80 mg via ORAL
  Filled 2013-10-29 (×9): qty 1

## 2013-10-29 MED ORDER — HEPARIN SODIUM (PORCINE) 5000 UNIT/ML IJ SOLN
5000.0000 [IU] | Freq: Three times a day (TID) | INTRAMUSCULAR | Status: DC
  Administered 2013-10-29 – 2013-11-06 (×23): 5000 [IU] via SUBCUTANEOUS
  Filled 2013-10-29 (×28): qty 1

## 2013-10-29 MED ORDER — PANTOPRAZOLE SODIUM 40 MG PO TBEC
40.0000 mg | DELAYED_RELEASE_TABLET | Freq: Every day | ORAL | Status: DC
  Administered 2013-10-29 – 2013-11-05 (×7): 40 mg via ORAL
  Filled 2013-10-29 (×7): qty 1

## 2013-10-29 NOTE — Progress Notes (Addendum)
Pt seen and examined. No issues overnight. No c/o this am x left eye swelling.  EXAM: Temp:  [99.1 F (37.3 C)-100.2 F (37.9 C)] 99.1 F (37.3 C) (03/05 0600) Pulse Rate:  [53-164] 66 (03/05 0700) Resp:  [13-25] 19 (03/05 0700) BP: (117-185)/(60-154) 167/81 mmHg (03/05 0700) SpO2:  [90 %-100 %] 100 % (03/05 0700) Intake/Output     03/04 0701 - 03/05 0700 03/05 0701 - 03/06 0700   P.O. 1150    I.V. (mL/kg) 1875 (15.4)    Other 520    IV Piggyback     Total Intake(mL/kg) 3545 (29)    Urine (mL/kg/hr) 875 (0.3)    Blood     Total Output 875     Net +2670          Urine Occurrence 2 x     Awake, alert, oriented Speech fluent CN intact No drift, good strength throughout  LABS: Lab Results  Component Value Date   CREATININE 2.00* 10/27/2013   BUN 23 10/27/2013   NA 141 10/27/2013   K 3.1* 10/27/2013   CL 103 10/27/2013   CO2 27 10/27/2013   Lab Results  Component Value Date   HGB 12.9* 10/27/2013   HCT 38.0* 10/27/2013    IMAGING: No new imaging  IMPRESSION: - 63 y.o. male SAH d#5, POD# 2 s/p clipping Acom aneurysm, neurologically at baseline - Hypokalemia - Chronic renal insufficiency likely sec to HTN/DM  PLAN: - Cont close observation - Spasm monitoring - TCD today. Cont Nimotop, Zocor - Routine postop care - Goal SBP < 154mmHg - CBC/BMP - cont IVF hydration - routine CBG monitoring, SSI - GI/DVT prophylaxis

## 2013-10-29 NOTE — Evaluation (Signed)
Speech Language Pathology Evaluation Patient Details Name: Riley Stuart MRN: [id number] DOB: 23-Oct-1950 Today's Date: 10/29/2013 Time: 1100-1115 SLP Time Calculation (min): 15 min  Problem List:  Patient Active Problem List   Diagnosis Date Noted  . SAH (subarachnoid hemorrhage) 10/26/2013   Past Medical History:  Past Medical History  Diagnosis Date  . Diabetes mellitus without complication   . Mental disorder     schizoaffective  . Hypertension   . Chronic kidney disease     renal insufficiency   Past Surgical History: History reviewed. No pertinent past surgical history. HPI:  63 yr old patient who lives in a group home seen at [facility] with the complain of headache and dizziness for 3 days. Patient denies any history of weakness, memory loss.  CT head at [facility] showed blood in the subfrontal area and transferred to [facility] and underwent Left fronto-temporal craniotomy for clipping of Acom aneurysm.   Assessment / Plan / Recommendation Clinical Impression  Pt. with possible baseline cognitive deficits, although no family present to confrim.  Pt.'s verbal responses for cognitive information functional, however suspect pt. may demonstrate challenges performing functional activities.Marland Kitchen  Speech intelligible in conversation.  ST will continue to follow for further diagnostic treatment of cognitive abiliites versus baseline status.       SLP Assessment  Patient needs continued Speech Lanaguage Pathology Services    Follow Up Recommendations   (TBD)    Frequency and Duration min 1 x/week  2 weeks   Pertinent Vitals/Pain WDL   SLP Goals  SLP Goals Potential to Achieve Goals: Good Potential Considerations: Previous level of function Progress/Goals/Alternative treatment plan discussed with pt/caregiver and they: Agree  SLP Evaluation Prior Functioning  Cognitive/Linguistic Baseline: Information not available Type of Home: Group Home   Cognition  Overall  Cognitive Status: No family/caregiver present to determine baseline cognitive functioning (suspect deficits post surgery) Arousal/Alertness: Awake/alert Orientation Level: Oriented X4 Attention: Sustained Sustained Attention: Appears intact Memory:  (TBA) Awareness: Appears intact Problem Solving: Appears intact Safety/Judgment:  (TBA)    Comprehension  Auditory Comprehension Overall Auditory Comprehension: Appears within functional limits for tasks assessed Visual Recognition/Discrimination Discrimination: Not tested Reading Comprehension Reading Status:  (TBA)    Expression Expression Primary Mode of Expression: Verbal Verbal Expression Overall Verbal Expression: Appears within functional limits for tasks assessed Level of Generative/Spontaneous Verbalization: Conversation Repetition: No impairment Naming: No impairment Pragmatics: No impairment Written Expression Dominant Hand: Right Written Expression:  (TBA pt. has to be prone to possible CHF leak??)   Oral / Motor Oral Motor/Sensory Function Overall Oral Motor/Sensory Function: Appears within functional limits for tasks assessed Motor Speech Overall Motor Speech: Appears within functional limits for tasks assessed Respiration: Within functional limits Phonation: Normal Resonance: Within functional limits Articulation: Within functional limitis Intelligibility: Intelligible Motor Planning: Witnin functional limits   GO     Orbie Pyo [employer] [phone]  10/29/2013

## 2013-10-29 NOTE — Clinical Social Work Note (Signed)
Clinical Social Worker continuing to follow patient and family for support and discharge planning needs.  Patient is from [name] and would like to return, however has stated his agreement to the possibility of rehab prior to return.  Patient brother has mentioned to RN that patient could benefit from rehab prior to return to the group home as well.  CSW left a message for patient brother Vontae Kaplowitz) per his request to further discuss patient needs at discharge.    CSW has requested inpatient rehab consult through RN and will initiate SNF search in [place] area once confirmed with patient brother.  CSW spoke with patient at bedside and he is agreeable with rehab needs but requested that Jenny Reichmann provide additional information.  CSW remains available for support and to facilitate patient discharge needs once medically stable.  Barbette Or, [place]

## 2013-10-29 NOTE — Progress Notes (Signed)
Rehab Admissions Coordinator Note:  Patient was screened by Retta Diones for appropriateness for an Inpatient Acute Rehab Consult.  At this time, we are recommending Inpatient Rehab consult.  Retta Diones 10/29/2013, 12:33 PM  I can be reached at [phone].

## 2013-10-29 NOTE — Progress Notes (Signed)
Physical Therapy Treatment Patient Details Name: Riley Stuart MRN: [id number] DOB: 1950-10-07 Today's Date: 10/29/2013 Time: [id number] PT Time Calculation (min): 14 min  PT Assessment / Plan / Recommendation  History of Present Illness Pt with SAH now POD#1 s/p clipping ACOM ANeurysm   PT Comments   Pt limited today due to pink/red drainage from nose when sitting EOB. (RN notified and pt returned immediately to supine) Pt required min/mod (A) for bed mobility today and requires max directional cues for sequencing due to decreased ability to problem solve. Brother very concerned per RN regarding D/C plan straight back to group home. Pt is requiring increased (A) at this time vs his mobility at admission. D/C disposition updated to CIR for continued therapy upon acute D/C to increase independence and decrease caregiver burden prior to returning to home.   Follow Up Recommendations  CIR     Does the patient have the potential to tolerate intense rehabilitation     Barriers to Discharge        Equipment Recommendations  Rolling walker with 5" wheels    Recommendations for Other Services OT consult;Rehab consult  Frequency Min 4X/week   Progress towards PT Goals Progress towards PT goals: Not progressing toward goals - comment (unable to today due to drainage from nose)  Plan Discharge plan needs to be updated    Precautions / Restrictions Precautions Precautions: Fall Precaution Comments: pt with known psychosis Restrictions Weight Bearing Restrictions: No   Pertinent Vitals/Pain C/o headache vitals stable t/o session.     Mobility  Bed Mobility Overal bed mobility: Needs Assistance Bed Mobility: Supine to Sit;Sit to Supine Supine to sit: Min assist;HOB elevated Sit to supine: Mod assist General bed mobility comments: Pt requires (A) to facilitate advancement of hips to sitting position; relies heavily on handrails; max directional cues for sequencing; returned to  supine position with (A) when red/pink drainage was noted coming from nose; RN made aware immediately Transfers General transfer comment: unable to transfer due to drainage from nose  Modified Rankin (Stroke Patients Only) Pre-Morbid Rankin Score: No significant disability Modified Rankin: Moderately severe disability         PT Diagnosis:    PT Problem List:   PT Treatment Interventions:     PT Goals (current goals can now be found in the care plan section) Acute Rehab PT Goals Patient Stated Goal: home PT Goal Formulation: With patient Time For Goal Achievement: 11/04/13 Potential to Achieve Goals: Good  Visit Information  Last PT Received On: 10/29/13 Assistance Needed: +2 History of Present Illness: Pt with SAH now POD#1 s/p clipping ACOM ANeurysm    Subjective Data  Subjective: Pt lying supine; reluctant initially but then agreeable to therapy with max encouragement  Patient Stated Goal: home   Cognition  Cognition Arousal/Alertness: Awake/alert Behavior During Therapy: Anxious Overall Cognitive Status: No family/caregiver present to determine baseline cognitive functioning    Balance  Balance Overall balance assessment: Needs assistance Sitting-balance support: Bilateral upper extremity supported;Feet supported Sitting balance-Leahy Scale: Poor Sitting balance - Comments: Rt lateral and posterior bias; max cues and min (A) to acheive midline; tolerated sitting ~2 min until drainage noted then returned to supine  Postural control: Posterior lean;Right lateral lean General Comments General comments (skin integrity, edema, etc.): spoke with RN who has communicated with brother; brother very concerned about D/C plan to group home; reports that the group home does not provide therapy there and would like to re-evaluate the D/C plan   End  of Session PT - End of Session Equipment Utilized During Treatment: Gait belt Activity Tolerance: Patient tolerated treatment  well Patient left: in bed;with call bell/phone within reach;with nursing/sitter in room Nurse Communication: Mobility status;Other (comment) (drainage from nose)   GP     Gustavus Bryant, [place] [phone] 10/29/2013, 12:15 PM

## 2013-10-29 NOTE — Progress Notes (Signed)
OT Cancellation Note  Patient Details Name: Riley Stuart MRN: [id number] DOB: 1951-01-16   Cancelled Treatment:    Reason Eval/Treat Not Completed: Medical issues which prohibited therapy (CSF leak. Bedrest x 48 hrs.)  [facility], OTR/L  [id number] 10/29/2013 10/29/2013, 4:00 PM

## 2013-10-30 ENCOUNTER — Encounter (HOSPITAL_COMMUNITY): Payer: Self-pay | Admitting: Physical Medicine and Rehabilitation

## 2013-10-30 DIAGNOSIS — I609 Nontraumatic subarachnoid hemorrhage, unspecified: Secondary | ICD-10-CM

## 2013-10-30 DIAGNOSIS — I6350 Cerebral infarction due to unspecified occlusion or stenosis of unspecified cerebral artery: Secondary | ICD-10-CM

## 2013-10-30 LAB — GLUCOSE, CAPILLARY
GLUCOSE-CAPILLARY: 136 mg/dL — AB (ref 70–99)
GLUCOSE-CAPILLARY: 147 mg/dL — AB (ref 70–99)
GLUCOSE-CAPILLARY: 162 mg/dL — AB (ref 70–99)
Glucose-Capillary: 139 mg/dL — ABNORMAL HIGH (ref 70–99)
Glucose-Capillary: 162 mg/dL — ABNORMAL HIGH (ref 70–99)

## 2013-10-30 LAB — BLOOD PRODUCT ORDER (VERBAL) VERIFICATION

## 2013-10-30 NOTE — Progress Notes (Addendum)
Pt seen and examined. Pt attempted to get OOB yesterday and was noted to have some pink drainage from nose and a HA. He was returned to bed. He reports no further leakage of fluid or significant HA. No other c/o.  EXAM: Temp:  [98.6 F (37 C)-100.2 F (37.9 C)] 98.6 F (37 C) (03/06 0308) Pulse Rate:  [54-118] 66 (03/06 0800) Resp:  [10-19] 15 (03/06 0800) BP: (123-184)/(52-96) 169/81 mmHg (03/06 0800) SpO2:  [79 %-100 %] 100 % (03/06 0800) Intake/Output     03/05 0701 - 03/06 0700 03/06 0701 - 03/07 0700   P.O. 1150 450   I.V. (mL/kg) 1725 (14.1)    Other     Total Intake(mL/kg) 2875 (23.5) 450 (3.7)   Urine (mL/kg/hr) 1445 (0.5) 100 (0.3)   Total Output 1445 100   Net +1430 +350        Urine Occurrence 4 x 1 x   Stool Occurrence 2 x 1 x    Awake, alert, oriented Speech fluent CN intact No drift, good strength throughout  LABS: Lab Results  Component Value Date   CREATININE 1.45* 10/29/2013   BUN 17 10/29/2013   NA 141 10/29/2013   K 3.5* 10/29/2013   CL 103 10/29/2013   CO2 25 10/29/2013   Lab Results  Component Value Date   WBC 14.5* 10/29/2013   HGB 12.2* 10/29/2013   HCT 34.6* 10/29/2013   MCV 88.9 10/29/2013   PLT 264 10/29/2013    IMAGING: No new imaging  IMPRESSION: - 63 y.o. male SAH d#6, POD# 3 s/p clipping Acom aneurysm, neurologically at baseline - Hypokalemia - resolving - Chronic renal insufficiency likely sec to HTN/DM  PLAN: - Cont close observation - Flat in bed for another 24 hrs - Spasm monitoring - TCD today. Cont Nimotop, Zocor - Routine postop care - Goal SBP < 120mmHg - cont IVF hydration - routine CBG monitoring, SSI - Consult  for eval  - GI/DVT prophylaxis

## 2013-10-30 NOTE — Consult Note (Signed)
Physical Medicine and Rehabilitation Consult  Reason for Consult: [facility] due to aneurysm rupture.  Referring Physician: Dr. Kathyrn Sheriff.    HPI: Riley Stuart is a 63 y.o. male with history of DM, CKD, schizoaffective disorder. He was admitted to [facility] from his group home on 10/26/13 with three day h/o dizziness and headache. CT head with SAH.  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 Kathyrn Sheriff on 10/27/13. He developed pink tinged drainage from nose yesterday and was placed on bedrest. Therapy evaluations done and CIR recommended by rehab team.    Review of Systems  HENT: Negative for hearing loss.   Eyes: Negative for double vision and photophobia.  Respiratory: Negative for cough and shortness of breath.   Cardiovascular: Negative for chest pain and palpitations.  Gastrointestinal: Negative for nausea and vomiting.  Musculoskeletal: Positive for back pain and joint pain.  Neurological: Positive for headaches.    Past Medical History  Diagnosis Date  . Diabetes mellitus without complication   . Mental disorder     schizoaffective  . Hypertension   . Chronic kidney disease     renal insufficiency   Past Surgical History  Procedure Laterality Date  . Ankle fracture surgery    . Knee surgery      due to fracture.     History reviewed. No pertinent family history.  Social History:  Lives in a group home. Keeps up his room and does chores. Does not use AD. He reports that he has been smoking--1PPD.  He does not have any smokeless tobacco history on file. He does not use alcohol or illicit drugs.   Allergies: No Known Allergies   Medications Prior to Admission  Medication Sig Dispense Refill  . acetaminophen (TYLENOL) 325 MG tablet Take 650 mg by mouth every 6 (six) hours as needed for mild pain or headache.      .  calcium-vitamin D (OSCAL WITH D) 500-200 MG-UNIT per tablet Take 1 tablet by mouth 2 (two) times daily.      . fenofibrate (TRICOR) 145 MG tablet Take 145 mg by mouth daily.      . insulin lispro protamine-lispro (HUMALOG 75/25 MIX) (75-25) 100 UNIT/ML SUSP injection Inject 35-50 Units into the skin 2 (two) times daily with a meal. Use 50 units in the morning and 35 units in the evening at 5pm      . lamoTRIgine (LAMICTAL) 100 MG tablet Take 100 mg by mouth 2 (two) times daily.      . metoprolol succinate (TOPROL-XL) 100 MG 24 hr tablet Take 100 mg by mouth daily. Take with or immediately following a meal.      . omeprazole (PRILOSEC) 20 MG capsule Take 20 mg by mouth daily.      . traMADol (ULTRAM) 50 MG tablet Take 50 mg by mouth 2 (two) times daily as needed for moderate pain.      . valsartan-hydrochlorothiazide (DIOVAN-HCT) 320-25 MG per tablet Take 1 tablet by mouth daily.      . ziprasidone (GEODON) 40 MG capsule Take 40 mg by mouth every evening. At 5pm.  Take with 2 geodon 80mg       . ziprasidone (GEODON) 80 MG capsule Take 160 mg by mouth every evening. At 5pm.  Take with geodon 40mg         Home: Home Living Family/patient expects  to be discharged to:: Group home Type of Home: Group Home Additional Comments: spoke with [name] From [facility] group home who reports they can provide minimal physical assist to patient and they do have a w/c that pt can use until pt safe to ambulate. Staff present to assist with ADLs, meal prep and medical management.  Functional History: Prior Function Comments: staff provided meals and medications Functional Status:  Mobility:          ADL:    Cognition: Cognition Overall Cognitive Status: No family/caregiver present to determine baseline cognitive functioning Arousal/Alertness: Awake/alert Orientation Level: Oriented X4 Attention: Sustained Sustained Attention: Appears intact Memory:  (TBA) Awareness: Appears intact Problem Solving:  Appears intact Safety/Judgment:  (TBA) Cognition Arousal/Alertness: Awake/alert Behavior During Therapy: Anxious Overall Cognitive Status: No family/caregiver present to determine baseline cognitive functioning  Blood pressure 169/81, pulse 66, temperature 98.6 F (37 C), temperature source Oral, resp. rate 15, height 6\' 2"  (1.88 m), weight 122.1 kg (269 lb 2.9 oz), SpO2 100.00%. Physical Exam  Nursing note and vitals reviewed. Constitutional: He is oriented to person, place, and time. He appears well-developed and well-nourished.  Polite and appropriate.  HENT:  Head: Normocephalic.  Left crani incision with staples intact. Left periorbital edema with ecchymosis.   Eyes: Conjunctivae are normal. Pupils are equal, round, and reactive to light.  Neck: Normal range of motion.  Cardiovascular: Normal rate and regular rhythm.   No murmur heard. Respiratory: Effort normal and breath sounds normal. No respiratory distress.  GI: Soft. Bowel sounds are normal. He exhibits no distension. There is no tenderness.  Musculoskeletal: He exhibits no edema.  Well healed old scars on left knee and left ankle.   Neurological: He is alert and oriented to person, place, and time.  Oral and LUE Dyskinesias noted. Oriented to day, date, place. Able to follow commands without difficulty. Moves all four  Skin: Skin is warm and dry.  Psychiatric:  Restless. Pleasant.     Results for orders placed during the hospital encounter of 10/26/13 (from the past 24 hour(s))  GLUCOSE, CAPILLARY     Status: Abnormal   Collection Time    10/29/13  3:42 PM      Result Value Ref Range   Glucose-Capillary 150 (*) 70 - 99 mg/dL   Comment 1 Notify RN     Comment 2 Documented in Chart    GLUCOSE, CAPILLARY     Status: Abnormal   Collection Time    10/29/13  7:49 PM      Result Value Ref Range   Glucose-Capillary 151 (*) 70 - 99 mg/dL   Comment 1 Notify RN     Comment 2 Documented in Chart    GLUCOSE, CAPILLARY      Status: Abnormal   Collection Time    10/29/13 11:21 PM      Result Value Ref Range   Glucose-Capillary 139 (*) 70 - 99 mg/dL   Comment 1 Documented in Chart     Comment 2 Notify RN    GLUCOSE, CAPILLARY     Status: Abnormal   Collection Time    10/30/13  3:07 AM      Result Value Ref Range   Glucose-Capillary 136 (*) 70 - 99 mg/dL   Comment 1 Documented in Chart     Comment 2 Notify RN    GLUCOSE, CAPILLARY     Status: Abnormal   Collection Time    10/30/13  8:19 AM      Result Value  Ref Range   Glucose-Capillary 162 (*) 70 - 99 mg/dL  GLUCOSE, CAPILLARY     Status: Abnormal   Collection Time    10/30/13 12:29 PM      Result Value Ref Range   Glucose-Capillary 147 (*) 70 - 99 mg/dL   No results found.  Assessment/Plan: Diagnosis: SAH d/t A-com aneurysm 1. Does the need for close, 24 hr/day medical supervision in concert with the patient's rehab needs make it unreasonable for this patient to be served in a less intensive setting? Yes and Potentially 2. Co-Morbidities requiring supervision/potential complications: htn 3. Due to bladder management, bowel management, safety, skin/wound care, disease management, medication administration, pain management and patient education, does the patient require 24 hr/day rehab nursing? Yes 4. Does the patient require coordinated care of a physician, rehab nurse, PT (1-2 hrs/day, 5 days/week), OT (1-2 hrs/day, 5 days/week) and SLP (1-2 hrs/day, 5 days/week) to address physical and functional deficits in the context of the above medical diagnosis(es)? Yes and Potentially Addressing deficits in the following areas: balance, endurance, locomotion, strength, transferring, bowel/bladder control, bathing, dressing, feeding, grooming, toileting, cognition, speech, swallowing and psychosocial support 5. Can the patient actively participate in an intensive therapy program of at least 3 hrs of therapy per day at least 5 days per week? Yes 6. The potential  for patient to make measurable gains while on inpatient rehab is good 7. Anticipated functional outcomes upon discharge from inpatient rehab are supervision with PT, supervision with OT, supervision with SLP. 8. Estimated rehab length of stay to reach the above functional goals is: ?10-17 days 9. Does the patient have adequate social supports to accommodate these discharge functional goals? Potentially 10. Anticipated D/C setting: Home 11. Anticipated post D/C treatments: [facility] therapy 12. Overall Rehab/Functional Prognosis: good  RECOMMENDATIONS: This patient's condition is appropriate for continued rehabilitative care in the following setting: likely CIR Patient has agreed to participate in recommended program. Potentially Note that insurance prior authorization may be required for reimbursement for recommended care.  Comment: Therapy evals pending. Would appear to have inpatient rehab needs however. Lives at a group home---would need to gage the home's ability to provide supervision and potentially some physical assist at discharge. Rehab Admissions Coordinator to follow up.  Thanks,  Meredith Staggers, MD, Mellody Drown     10/30/2013

## 2013-10-30 NOTE — Clinical Social Work Note (Signed)
Clinical Social Worker continuing to follow patient and family for support and discharge planning needs. Patient is from [name] and would like to return, however has stated his agreement to the possibility of rehab prior to return. CSW spoke with patient brother over the phone who states that he is in full agreement with rehab plans and primary preference would be for patient to go to inpatient rehab.    CSW spoke with MD who states that due to patient condition, patient will likely remain hospitalized through next week.   CSW has requested inpatient rehab consult and will initiate SNF search in [place] area.  CSW to submit for 30 day Pasarr once patient is closer to discharge.  CSW remains available for support and to facilitate patient discharge needs once medically stable.   Barbette Or, [place]

## 2013-10-30 NOTE — Clinical Social Work Placement (Signed)
Clinical Social Work Department CLINICAL SOCIAL WORK PLACEMENT NOTE 10/30/2013  Patient:  Riley Stuart, Riley Stuart  Account Number:  [id number] Admit date:  10/26/2013  Clinical Social Worker:  Barbette Or, LCSW  Date/time:  10/30/2013 01:30 PM  Clinical Social Work is seeking post-discharge placement for this patient at the following level of care:   SKILLED NURSING   (*CSW will update this form in Epic as items are completed)   10/30/2013  Patient/family provided with [place] Department of Clinical Social Work's list of facilities offering this level of care within the geographic area requested by the patient (or if unable, by the patient's family).  10/30/2013  Patient/family informed of their freedom to choose among providers that offer the needed level of care, that participate in Medicare, Medicaid or managed care program needed by the patient, have an available bed and are willing to accept the patient.  10/30/2013  Patient/family informed of [facility]' ownership interest in [name], as well as of the fact that they are under no obligation to receive care at this facility.  PASARR submitted to EDS on  PASARR number received from [place] on   FL2 transmitted to all facilities in geographic area requested by pt/family on  10/30/2013 FL2 transmitted to all facilities within larger geographic area on   Patient informed that his/her managed care company has contracts with or will negotiate with  certain facilities, including the following:     Patient/family informed of bed offers received:   Patient chooses bed at  Physician recommends and patient chooses bed at    Patient to be transferred to  on   Patient to be transferred to facility by   The following physician request were entered in Epic:   Additional Comments:

## 2013-10-30 NOTE — Progress Notes (Signed)
PT Cancellation Note  Patient Details Name: Riley Stuart MRN: [id number] DOB: [birth year]   Cancelled Treatment:    Reason Eval/Treat Not Completed: Patient not medically ready. Pt on bedrest for next 24 hours secondary to CSF leak. Will re-attempt to see pt when medically ready.    Elie Confer Bruce, [place] 10/30/2013, 7:44 AM

## 2013-10-30 NOTE — Progress Notes (Addendum)
Transcranial Doppler  Date POD PCO2 HCT BP  MCA ACA PCA OPHT SIPH VERT Basilar  10/30/13 MS     Right  Left   *  16   *  -28   *  *   20  17   *  -39   *  *   *           Right  Left                Right  Left                                             Right  Left                                             Right  Left                                            Right  Left                                            Right  Left                                        MCA = Middle Cerebral Artery      OPHT = Opthalmic Artery     BASILAR = Basilar Artery   ACA = Anterior Cerebral Artery     SIPH = Carotid Siphon PCA = Posterior Cerebral Artery   VERT = Verterbral Artery                   Normal MCA = 62+\-12 ACA = 50+\-12 PCA = 42+\-23   *Unable to insonate, study was technically limited due to poor acoustic windows.  10/30/13 Maudry Mayhew, RVT, RDCS, RDMS

## 2013-10-30 NOTE — Progress Notes (Signed)
UR completed. Recommendations are for home with [facility] at last therapy session.   Sandi Mariscal, RN BSN [place] CCM Trauma/Neuro ICU Case Manager [phone]

## 2013-10-31 LAB — TYPE AND SCREEN
ABO/RH(D): O NEG
Antibody Screen: NEGATIVE
Unit division: 0
Unit division: 0

## 2013-10-31 LAB — GLUCOSE, CAPILLARY
GLUCOSE-CAPILLARY: 142 mg/dL — AB (ref 70–99)
GLUCOSE-CAPILLARY: 117 mg/dL — AB (ref 70–99)
GLUCOSE-CAPILLARY: 157 mg/dL — AB (ref 70–99)
GLUCOSE-CAPILLARY: 132 mg/dL — AB (ref 70–99)
Glucose-Capillary: 135 mg/dL — ABNORMAL HIGH (ref 70–99)
Glucose-Capillary: 141 mg/dL — ABNORMAL HIGH (ref 70–99)
Glucose-Capillary: 153 mg/dL — ABNORMAL HIGH (ref 70–99)

## 2013-10-31 MED ORDER — PNEUMOCOCCAL VAC POLYVALENT 25 MCG/0.5ML IJ INJ
0.5000 mL | INJECTION | INTRAMUSCULAR | Status: AC
  Administered 2013-11-02: 0.5 mL via INTRAMUSCULAR
  Filled 2013-10-31 (×2): qty 0.5

## 2013-10-31 NOTE — Progress Notes (Signed)
Physical Therapy Treatment Patient Details Name: CHESS TREVETT MRN: [id number] DOB: [birth year] Today's Date: 10/31/2013 Time: [id number] PT Time Calculation (min): 24 min  PT Assessment / Plan / Recommendation  History of Present Illness Pt with SAH; s/p clipping ACOM ANeurysm   PT Comments   Pt progressed with therapy today. Was able to sit EOB ~5 min and perform theraex. Pt also able to ambulate short distance to recliner with 2 person handheld (A) for safety. Pt continues to have dysarthric speech, decr safety awareness and decr balance. Pt continues to be a great candidate for CIR. Will cont to follow per POC.   Follow Up Recommendations  CIR     Does the patient have the potential to tolerate intense rehabilitation     Barriers to Discharge        Equipment Recommendations  Rolling walker with 5" wheels    Recommendations for Other Services OT consult;Rehab consult  Frequency Min 4X/week   Progress towards PT Goals Progress towards PT goals: Progressing toward goals  Plan Current plan remains appropriate    Precautions / Restrictions Precautions Precautions: Fall Precaution Comments: pt with known psychosis Restrictions Weight Bearing Restrictions: No   Pertinent Vitals/Pain C/o dizziness initially; all vitals stable; rated headache at a 2/10     Mobility  Bed Mobility Overal bed mobility: Needs Assistance Bed Mobility: Supine to Sit Supine to sit: Min assist;HOB elevated General bed mobility comments: pt continues to be impulsive with movements; unaware of lines/leads; max cues for sequencing and safety; (A) to elevate pt to sitting position  Transfers Overall transfer level: Needs assistance Equipment used: 2 person hand held assist Transfers: Sit to/from Stand Sit to Stand: +2 safety/equipment;Min assist General transfer comment: pt required 2 person HHA for safety due to dizziniess and unsteady; cues for sequencing; pt leaning posteriorly and bracing LEs  against bed for support; pt unaware of lines/leads and attempting to walk but was tangled Ambulation/Gait Ambulation/Gait assistance: +2 safety/equipment;Min assist Ambulation Distance (Feet): 5 Feet Assistive device: 2 person hand held assist Gait Pattern/deviations: Decreased stride length;Step-through pattern;Shuffle;Trunk flexed;Wide base of support Gait velocity: impulsivly quick and unsafe General Gait Details: pt became very impulsive when standing and c/o dizzinies; attempting to ambulate to chair and unaware of lines and leads; requires 2 person HHA for safety; max multidirectional cues for sequencing  Modified Rankin (Stroke Patients Only) Pre-Morbid Rankin Score: No significant disability Modified Rankin: Moderately severe disability    Exercises General Exercises - Lower Extremity Ankle Circles/Pumps: AROM;Both;10 reps;Supine Long Arc Quad: AROM;Strengthening;Both;10 reps;Seated Heel Slides: AROM;Both;Strengthening;10 reps;Supine Hip Flexion/Marching: AROM;Strengthening;Both;10 reps;Seated   PT Diagnosis:    PT Problem List:   PT Treatment Interventions:     PT Goals (current goals can now be found in the care plan section) Acute Rehab PT Goals Patient Stated Goal: home PT Goal Formulation: With patient Time For Goal Achievement: 11/04/13 Potential to Achieve Goals: Good  Visit Information  Last PT Received On: 10/31/13 Assistance Needed: +2 History of Present Illness: Pt with SAH; s/p clipping ACOM ANeurysm    Subjective Data  Subjective: pt lying supine; agreeable to get to chair. "i dont know if i want to walk but i can get to the chair"  Patient Stated Goal: home   Cognition  Cognition Arousal/Alertness: Awake/alert Behavior During Therapy: Anxious Overall Cognitive Status: No family/caregiver present to determine baseline cognitive functioning    Balance  Balance Overall balance assessment: Needs assistance Sitting-balance support: Feet  supported;Bilateral upper extremity supported Sitting balance-Leahy  Scale: Poor Sitting balance - Comments: bil UE supported; c/o dizziniess and swaying at EOB; vitals stable; cues for deep breathing; stated dizziness subsided in ~2 min; sat EOB prior to transfer ~5 min Postural control: Other (comment) (swaying at EOB but no LOB noted) Standing balance support: During functional activity;Bilateral upper extremity supported Standing balance-Leahy Scale: Poor Standing balance comment: bil UE supported; legs bracing posteriorly  General Comments General comments (skin integrity, edema, etc.): craniotomy incision site; educated pt not to touch it; pt tends to graze against it with hands   End of Session PT - End of Session Equipment Utilized During Treatment: Gait belt Activity Tolerance: Patient tolerated treatment well Patient left: in chair;with call bell/phone within reach Nurse Communication: Mobility status;Precautions   GP     Gustavus Bryant, [place] 10/31/2013, 4:22 PM

## 2013-10-31 NOTE — Progress Notes (Signed)
Patient ID: Riley Stuart, male   DOB: [birth year], 63 y.o.   MRN: [id number] BP 162/75  Pulse 77  Temp(Src) 98.8 F (37.1 C) (Oral)  Resp 18  Ht 6\' 2"  (1.88 m)  Wt 122.1 kg (269 lb 2.9 oz)  BMI 34.55 kg/m2  SpO2 94% Alert and oriented x 4 Moving all extremities well Perrl, full eom No drift Speech is mildly dysarthric, easily understood

## 2013-11-01 LAB — GLUCOSE, CAPILLARY
GLUCOSE-CAPILLARY: 126 mg/dL — AB (ref 70–99)
Glucose-Capillary: 104 mg/dL — ABNORMAL HIGH (ref 70–99)
Glucose-Capillary: 108 mg/dL — ABNORMAL HIGH (ref 70–99)
Glucose-Capillary: 178 mg/dL — ABNORMAL HIGH (ref 70–99)
Glucose-Capillary: 133 mg/dL — ABNORMAL HIGH (ref 70–99)
Glucose-Capillary: 144 mg/dL — ABNORMAL HIGH (ref 70–99)

## 2013-11-01 NOTE — Progress Notes (Signed)
Patient ID: Riley Stuart, male   DOB: [birth year], 63 y.o.   MRN: [id number] BP 160/86  Pulse 62  Temp(Src) 98.1 F (36.7 C) (Oral)  Resp 20  Ht 6\' 2"  (1.88 m)  Wt 122.1 kg (269 lb 2.9 oz)  BMI 34.55 kg/m2  SpO2 98% Alert, oriented to person, place, situation Perrl, full eom Symmetric facies Tongue and uvula midline Moving all extremities well Wound is cleAn, dry, no signs of infection

## 2013-11-02 ENCOUNTER — Encounter (HOSPITAL_COMMUNITY): Payer: Self-pay | Admitting: Neurosurgery

## 2013-11-02 LAB — GLUCOSE, CAPILLARY
GLUCOSE-CAPILLARY: 116 mg/dL — AB (ref 70–99)
GLUCOSE-CAPILLARY: 115 mg/dL — AB (ref 70–99)
Glucose-Capillary: 109 mg/dL — ABNORMAL HIGH (ref 70–99)
Glucose-Capillary: 166 mg/dL — ABNORMAL HIGH (ref 70–99)
Glucose-Capillary: 146 mg/dL — ABNORMAL HIGH (ref 70–99)
Glucose-Capillary: 153 mg/dL — ABNORMAL HIGH (ref 70–99)

## 2013-11-02 NOTE — Progress Notes (Signed)
Occupational Therapy Evaluation Patient Details Name: Riley Stuart MRN: [id number] DOB: [birth year] Today's Date: 11/02/2013 Time: [id number] OT Time Calculation (min): 35 min  OT Assessment / Plan / Recommendation History of present illness Pt with SAH; s/p clipping ACOM ANeurysm   Clinical Impression   PTA, pt lived in group home and was independent with basic ADL. Assistance provided for IADL tasks. PT preents with funcitonal decline and wil benefit from CIR to return to PLOF. Pt will benefit from skilled OT services to facilitate D/C to next venue due to below deficits.    OT Assessment  Patient needs continued OT Services    Follow Up Recommendations  CIR;Supervision/Assistance - 24 hour    Barriers to Discharge      Equipment Recommendations  3 in 1 bedside comode;Tub/shower seat    Recommendations for Other Services Rehab consult  Frequency  Min 2X/week    Precautions / Restrictions Precautions Precautions: Fall Precaution Comments: pt with known psychosis Restrictions Weight Bearing Restrictions: No   Pertinent Vitals/Pain no apparent distress     ADL  Grooming: Wash/dry hands;Performed;Set up;Supervision/safety Where Assessed - Grooming: Supported standing Lower Body Bathing: Performed;Supervision/safety;Set up (Able to perform hygiene care after bowel movement) Where Assessed - Lower Body Bathing: Supported sit to stand Upper Body Dressing: Set up;Supervision/safety Where Assessed - Upper Body Dressing: Unsupported sitting Lower Body Dressing: Supervision/safety (Doffing socks ) Where Assessed - Lower Body Dressing: Unsupported sitting Toilet Transfer: Minimal assistance Toilet Transfer Method: Sit to stand Toilet Transfer Equipment: Raised toilet seat with arms (or 3-in-1 over toilet) Toileting - Clothing Manipulation and Hygiene: Supervision/safety;Min guard;Set up Where Assessed - Toileting Clothing Manipulation and Hygiene: Sit to stand from 3-in-1  or toilet Transfers/Ambulation Related to ADLs: Pt performed transfers with min A from sit to stand and was able to ambulate from bed to bathroom using a walker and min A. Pt able to transfer from stand to sit on to 3-in-1 raised toilet seat with min A.     OT Diagnosis: Generalized weakness  OT Problem List: Decreased strength;Decreased activity tolerance;Impaired balance (sitting and/or standing);Decreased safety awareness;Decreased knowledge of use of DME or AE;Obesity OT Treatment Interventions: Self-care/ADL training;Therapeutic exercise;DME and/or AE instruction;Therapeutic activities;Patient/family education;Balance training   OT Goals(Current goals can be found in the care plan section) Acute Rehab OT Goals Patient Stated Goal: Return home  OT Goal Formulation: With patient Time For Goal Achievement: 11/16/13 Potential to Achieve Goals: Good  Visit Information  Last OT Received On: 11/02/13 Assistance Needed: +1 History of Present Illness: Pt with SAH; s/p clipping ACOM ANeurysm       Prior Functioning     Home Living Family/patient expects to be discharged to:: Group home Type of Home: Group Home Prior Function Level of Independence: Needs assistance Gait / Transfers Assistance Needed: Pt stated he was formerly Independent for all ADLs ADL's / Homemaking Assistance Needed: Pt stated he was formerly Independent for cooking task. Communication Communication: No difficulties Dominant Hand: Right         Vision/Perception Perception Perception: Within Functional Limits   Cognition  Cognition Arousal/Alertness: Awake/alert Behavior During Therapy: WFL for tasks assessed/performed Overall Cognitive Status: No family/caregiver present to determine baseline cognitive functioning (Can follow commands ) Memory:  (appears at baseline)    Extremity/Trunk Assessment Upper Extremity Assessment Upper Extremity Assessment: Overall WFL for tasks assessed Lower Extremity  Assessment Lower Extremity Assessment: Overall WFL for tasks assessed Cervical / Trunk Assessment Cervical / Trunk Assessment: Normal     Mobility  Bed Mobility Overal bed mobility: Needs Assistance Bed Mobility: Sidelying to Sit;Sit to Supine Rolling: Min guard Sidelying to sit: Min guard Supine to sit: Supervision Sit to supine: Min guard General bed mobility comments: Pt able to perform task with some physical/verbal cues. Pt can be impulsive  Transfers Overall transfer level: Needs assistance Equipment used: Rolling walker (2 wheeled) Transfers: Sit to/from Stand Sit to Stand: Min assist Stand pivot transfers: Min assist General transfer comment: Pt required verbal cues and a two-wheel rolling walker to perform transfers. Min A was given for safety       Exercise     Balance Balance Overall balance assessment: Needs assistance Sitting-balance support: No upper extremity supported;Feet supported Sitting balance-Leahy Scale: Good Sitting balance - Comments: Was able to maintain sitting in bed for approx 5 mins. Standing balance support: Bilateral upper extremity supported;During functional activity (used walker to ambulated and perform functional activities) Standing balance-Leahy Scale: Fair Standing balance comment: Min A using walker to ambulate to and from bathroom  General Comments General comments (skin integrity, edema, etc.): craniotomy incision   End of Session OT - End of Session Equipment Utilized During Treatment: Gait belt;Rolling walker Activity Tolerance: Patient tolerated treatment well (Was tired after session) Patient left: in bed;with call bell/phone within reach;with bed alarm set Nurse Communication: Mobility status  GO     Suad Autrey,HILLARY 11/02/2013, 5:10 PM [facility], OTR/L  [phone] 11/02/2013

## 2013-11-02 NOTE — Progress Notes (Signed)
Pt seen and examined. No c/o currently, denying HA, visual changes, N/T/W. He denies any rhino- or otorrhea. No postural HA.  EXAM: Temp:  [98 F (36.7 C)-99.4 F (37.4 C)] 98.1 F (36.7 C) (03/09 1600) Pulse Rate:  [46-158] 108 (03/09 1700) Resp:  [10-20] 16 (03/09 1700) BP: (107-170)/(57-98) 155/98 mmHg (03/09 1700) SpO2:  [91 %-100 %] 96 % (03/09 1700) Intake/Output     03/08 0701 - 03/09 0700 03/09 0701 - 03/10 0700   P.O. 1284    I.V. (mL/kg) 1800 (14.7) 750 (6.1)   Total Intake(mL/kg) 3084 (25.3) 750 (6.1)   Urine (mL/kg/hr) 4950 (1.7) 2325 (1.7)   Total Output 4950 2325   Net -1866 -1575         Awake, alert, oriented Speech fluent CN intact No drift, good strength throughout  LABS: Lab Results  Component Value Date   CREATININE 1.45* 10/29/2013   BUN 17 10/29/2013   NA 141 10/29/2013   K 3.5* 10/29/2013   CL 103 10/29/2013   CO2 25 10/29/2013   Lab Results  Component Value Date   WBC 14.5* 10/29/2013   HGB 12.2* 10/29/2013   HCT 34.6* 10/29/2013   MCV 88.9 10/29/2013   PLT 264 10/29/2013    IMAGING: TCD velocities reviewed, available data not indicative of spasm  IMPRESSION: - 63 y.o. 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 Cont Nimotop, Zocor - cont IVF hydration - routine CBG monitoring, SSI - GI/DVT prophylaxis

## 2013-11-02 NOTE — Progress Notes (Signed)
PT Cancellation Note  Patient Details Name: [name] MRN: [id number] DOB: 08-May-1951   Cancelled Treatment:    Pt politely declined due to "I just got back in bed. Just wait a little while longer." Pt educated on importance of OOB however pt con't to decline. OT just worked with pt about 30 min prior to PT arrival. PT to return as able.   Kingsley Callander 11/02/2013, 4:33 PM

## 2013-11-02 NOTE — Clinical Social Work Note (Signed)
Clinical Social Worker continuing to follow patient and family for support and discharge planning needs.  CSW spoke with inpatient rehab admissions coordinator who spoke with [name] at [facility] and patient brother to confirm patient plans.  Inpatient rehab admissions coordinator is working with rehab MD regarding patient goals at discharge.  CSW has initiated referral in [name] for SNF but has not yet completed 30 day [place] due to patient unknown time of hospitalization.  CSW remains available for support and to assist with patient discharge needs once medically appropriate.    Barbette Or, Ila

## 2013-11-02 NOTE — Progress Notes (Signed)
I met with pt at bedside to begin discussions of needed continued therapy before he returns to [facility] when medically ready. I will contact his brother as well as [name] at the group home to discuss rehab needs and care needs available when he returns to group home. [phone]

## 2013-11-02 NOTE — Progress Notes (Addendum)
Transcranial Doppler  Date POD PCO2 HCT BP  MCA ACA PCA OPHT SIPH VERT Basilar  10/30/13 MS     Right  Left   *  16   *  -28   *  *   20  17   *  -39   *  *   *      11/02/13 MS     Right  Left   *        24 *         -29 *         22 10        20  *         28 -27       -26 -26           Right  Left                                             Right  Left                                             Right  Left                                            Right  Left                                            Right  Left                                        MCA = Middle Cerebral Artery      OPHT = Opthalmic Artery     BASILAR = Basilar Artery   ACA = Anterior Cerebral Artery     SIPH = Carotid Siphon PCA = Posterior Cerebral Artery   VERT = Verterbral Artery                   Normal MCA = 62+\-12 ACA = 50+\-12 PCA = 42+\-23   *Unable to insonate, study was technically limited due to poor acoustic windows.  11/02/2013 2:38 PM Maudry Mayhew, RVT, RDCS, RDMS

## 2013-11-03 LAB — CBC
HCT: 34 % — ABNORMAL LOW (ref 39.0–52.0)
Hemoglobin: 12.2 g/dL — ABNORMAL LOW (ref 13.0–17.0)
MCH: 31.4 pg (ref 26.0–34.0)
MCHC: 35.9 g/dL (ref 30.0–36.0)
MCV: 87.6 fL (ref 78.0–100.0)
Platelets: 385 10*3/uL (ref 150–400)
RBC: 3.88 MIL/uL — ABNORMAL LOW (ref 4.22–5.81)
RDW: 14.2 % (ref 11.5–15.5)
WBC: 10.2 10*3/uL (ref 4.0–10.5)

## 2013-11-03 LAB — BASIC METABOLIC PANEL
BUN: 14 mg/dL (ref 6–23)
CO2: 21 mEq/L (ref 19–32)
Calcium: 8.8 mg/dL (ref 8.4–10.5)
Chloride: 99 mEq/L (ref 96–112)
Creatinine, Ser: 1.24 mg/dL (ref 0.50–1.35)
GFR, EST AFRICAN AMERICAN: 70 mL/min — AB (ref >90)
GFR, EST NON AFRICAN AMERICAN: 60 mL/min — AB (ref >90)
GLUCOSE: 118 mg/dL — AB (ref 70–99)
POTASSIUM: 3.8 meq/L (ref 3.7–5.3)
Sodium: 134 mEq/L — ABNORMAL LOW (ref 137–147)

## 2013-11-03 LAB — GLUCOSE, CAPILLARY
GLUCOSE-CAPILLARY: 111 mg/dL — AB (ref 70–99)
GLUCOSE-CAPILLARY: 152 mg/dL — AB (ref 70–99)
GLUCOSE-CAPILLARY: 168 mg/dL — AB (ref 70–99)
Glucose-Capillary: 109 mg/dL — ABNORMAL HIGH (ref 70–99)
Glucose-Capillary: 120 mg/dL — ABNORMAL HIGH (ref 70–99)
Glucose-Capillary: 128 mg/dL — ABNORMAL HIGH (ref 70–99)
Glucose-Capillary: 171 mg/dL — ABNORMAL HIGH (ref 70–99)

## 2013-11-03 NOTE — Progress Notes (Signed)
Physical Therapy Treatment Patient Details Name: Riley Stuart MRN: [id number] DOB: Jul 18, 1951 Today's Date: 11/03/2013 Time: [id number] PT Time Calculation (min): 25 min  PT Assessment / Plan / Recommendation  History of Present Illness Pt with SAH; s/p clipping ACOM ANeurysm   PT Comments   Pt is progressing towards functional goals. Pt was able to amb 20 ft with RW but was self limiting his performance. Verbal and tactile cues were used to assist forward amb. Pt remains to be a great candidate for CIR. Will continue to follow per POC  Follow Up Recommendations  CIR     Does the patient have the potential to tolerate intense rehabilitation     Barriers to Discharge        Equipment Recommendations  Rolling walker with 5" wheels    Recommendations for Other Services    Frequency Min 4X/week   Progress towards PT Goals Progress towards PT goals: Progressing toward goals  Plan Current plan remains appropriate    Precautions / Restrictions Precautions Precautions: Fall Precaution Comments: pt with known psychosis Restrictions Weight Bearing Restrictions: No   Pertinent Vitals/Pain Pt reports minor headache pain but no increase in pain during activity.    Mobility  Bed Mobility Overal bed mobility: Needs Assistance Bed Mobility: Supine to Sit Rolling: Supervision Sidelying to sit: Min guard Supine to sit: Supervision General bed mobility comments: pt able to perform tasks with verbal cuing Transfers Overall transfer level: Needs assistance Equipment used: Rolling walker (2 wheeled) Transfers: Sit to/from Stand Sit to Stand: Min assist Stand pivot transfers: Min assist General transfer comment: Pt required verbal cues and a two-wheel rolling walker to perform transfers. Pt req verbal cues for safety and hand placement. Min A was given for safety   Ambulation/Gait Ambulation/Gait assistance: Min assist Ambulation Distance (Feet): 20 Feet Assistive device:  Rolling walker (2 wheeled) Gait Pattern/deviations: Shuffle;Decreased stride length;Step-to pattern (Bilat toe out) Gait velocity: slow and guarded to prevent falling General Gait Details: pt refuses to walk further stating he wasn't "properly dressed" (real clothes). Pt appeared to be fearful of falling. tactile cues and verbal encouragement to promote further distance amb    Exercises     PT Diagnosis:    PT Problem List:   PT Treatment Interventions:     PT Goals (current goals can now be found in the care plan section) Acute Rehab PT Goals Patient Stated Goal: Return home  PT Goal Formulation: With patient Time For Goal Achievement: 11/04/13 Potential to Achieve Goals: Good  Visit Information  Last PT Received On: 11/03/13 Assistance Needed: +1 History of Present Illness: Pt with SAH; s/p clipping ACOM ANeurysm    Subjective Data  Patient Stated Goal: Return home    Cognition  Cognition Arousal/Alertness: Awake/alert Behavior During Therapy: WFL for tasks assessed/performed Overall Cognitive Status: Within Functional Limits for tasks assessed    Balance  Balance Overall balance assessment: Needs assistance Sitting-balance support: No upper extremity supported Sitting balance-Leahy Scale: Good Sitting balance - Comments: pt able to sit EOB without use of UE support for approximately three min Standing balance support: Bilateral upper extremity supported Standing balance-Leahy Scale: Fair Standing balance comment: req RW for standing support. min guard for safety  End of Session PT - End of Session Equipment Utilized During Treatment: Gait belt (RW) Activity Tolerance: Patient tolerated treatment well (pt was self limiting during tx) Patient left: in chair;with call bell/phone within reach Nurse Communication: Mobility status   GP     Manley Mason  SPT 11/03/2013, 3:37 PM  Agree with above assessment.  Kittie Plater, PT, DPT Pager #: [phone] Office #:  [phone]

## 2013-11-03 NOTE — Progress Notes (Signed)
Physical Therapy Treatment Patient Details Name: Riley Stuart MRN: [id number] DOB: May 11, 1951 Today's Date: 11/03/2013 Time: [id number] PT Time Calculation (min): 25 min  PT Assessment / Plan / Recommendation  History of Present Illness Pt with SAH; s/p clipping ACOM ANeurysm   PT Comments   Pt is progressing towards functional goals. Pt was able to amb 20 ft with RW but was self limiting his performance. Verbal and tactile cues were used to assist forward amb. Pt remains to be a great candidate for CIR. Will continue to follow per POC  Follow Up Recommendations  CIR     Does the patient have the potential to tolerate intense rehabilitation     Barriers to Discharge        Equipment Recommendations  Rolling walker with 5" wheels    Recommendations for Other Services    Frequency Min 4X/week   Progress towards PT Goals Progress towards PT goals: Progressing toward goals  Plan Current plan remains appropriate    Precautions / Restrictions Precautions Precautions: Fall Precaution Comments: pt with known psychosis Restrictions Weight Bearing Restrictions: No   Pertinent Vitals/Pain Pt reports minor headache pain but no increase in pain during activity.    Mobility  Bed Mobility Overal bed mobility: Needs Assistance Bed Mobility: Supine to Sit Rolling: Supervision Sidelying to sit: Min guard Supine to sit: Supervision General bed mobility comments: pt able to perform tasks with verbal cuing Transfers Overall transfer level: Needs assistance Equipment used: Rolling walker (2 wheeled) Transfers: Sit to/from Stand Sit to Stand: Min assist Stand pivot transfers: Min assist General transfer comment: Pt required verbal cues and a two-wheel rolling walker to perform transfers. Pt req verbal cues for safety and hand placement. Min A was given for safety   Ambulation/Gait Ambulation/Gait assistance: Min assist Ambulation Distance (Feet): 20 Feet Assistive device:  Rolling walker (2 wheeled) Gait Pattern/deviations: Shuffle;Decreased stride length;Step-to pattern (Bilat toe out) Gait velocity: slow and guarded to prevent falling General Gait Details: pt refuses to walk further stating he wasn't "properly dressed" (real clothes). Pt appeared to be fearful of falling. tactile cues and verbal encouragement to promote further distance amb    Exercises     PT Diagnosis:    PT Problem List:   PT Treatment Interventions:     PT Goals (current goals can now be found in the care plan section) Acute Rehab PT Goals Patient Stated Goal: Return home  PT Goal Formulation: With patient Time For Goal Achievement: 11/04/13 Potential to Achieve Goals: Good  Visit Information  Last PT Received On: 11/03/13 Assistance Needed: +1 History of Present Illness: Pt with SAH; s/p clipping ACOM ANeurysm    Subjective Data  Patient Stated Goal: Return home    Cognition  Cognition Arousal/Alertness: Awake/alert Behavior During Therapy: WFL for tasks assessed/performed Overall Cognitive Status: Within Functional Limits for tasks assessed    Balance  Balance Overall balance assessment: Needs assistance Sitting-balance support: No upper extremity supported Sitting balance-Leahy Scale: Good Sitting balance - Comments: pt able to sit EOB without use of UE support for approximately three min Standing balance support: Bilateral upper extremity supported Standing balance-Leahy Scale: Fair Standing balance comment: req RW for standing support. min guard for safety  End of Session PT - End of Session Equipment Utilized During Treatment: Gait belt (RW) Activity Tolerance: Patient tolerated treatment well (pt was self limiting during tx) Patient left: in chair;with call bell/phone within reach Nurse Communication: Mobility status   GP     Manley Mason  SPT 11/03/2013, 3:37 PM

## 2013-11-03 NOTE — Progress Notes (Signed)
Pt seen and examined. No current c/o. Doing well, sitting in bedside chair.  EXAM: Temp:  [98.1 F (36.7 C)-99.7 F (37.6 C)] 99.3 F (37.4 C) (03/10 1127) Pulse Rate:  [44-108] 68 (03/10 1300) Resp:  [8-19] 16 (03/10 1300) BP: (127-175)/(67-106) 166/106 mmHg (03/10 1300) SpO2:  [91 %-100 %] 95 % (03/10 1300) Intake/Output     03/09 0701 - 03/10 0700 03/10 0701 - 03/11 0700   P.O. 444    I.V. (mL/kg) 1725 (14.1) 450 (3.7)   Total Intake(mL/kg) 2169 (17.8) 450 (3.7)   Urine (mL/kg/hr) 4525 (1.5) 1400 (1.5)   Total Output 4525 1400   Net -2356 -950        Urine Occurrence 2 x     Awake, alert, oriented Speech fluent CN intact No drift, good strength throughout Wound c/d/i  LABS: Lab Results  Component Value Date   CREATININE 1.24 11/03/2013   BUN 14 11/03/2013   NA 134* 11/03/2013   K 3.8 11/03/2013   CL 99 11/03/2013   CO2 21 11/03/2013   Lab Results  Component Value Date   WBC 10.2 11/03/2013   HGB 12.2* 11/03/2013   HCT 34.0* 11/03/2013   MCV 87.6 11/03/2013   PLT 385 11/03/2013    IMAGING: No new imaging  IMPRESSION: - 63 y.o. 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 Cont Nimotop, Zocor - cont current mgmt - PT/OT for mobilization

## 2013-11-04 LAB — GLUCOSE, CAPILLARY
GLUCOSE-CAPILLARY: 129 mg/dL — AB (ref 70–99)
Glucose-Capillary: 134 mg/dL — ABNORMAL HIGH (ref 70–99)
Glucose-Capillary: 130 mg/dL — ABNORMAL HIGH (ref 70–99)
Glucose-Capillary: 127 mg/dL — ABNORMAL HIGH (ref 70–99)

## 2013-11-04 MED ORDER — INSULIN ASPART 100 UNIT/ML ~~LOC~~ SOLN
0.0000 [IU] | Freq: Every day | SUBCUTANEOUS | Status: DC
Start: 2013-11-04 — End: 2013-11-06

## 2013-11-04 MED ORDER — ZIPRASIDONE HCL 80 MG PO CAPS
80.0000 mg | ORAL_CAPSULE | Freq: Two times a day (BID) | ORAL | Status: DC
Start: 2013-11-04 — End: 2013-11-06
  Administered 2013-11-04 – 2013-11-06 (×4): 80 mg via ORAL
  Filled 2013-11-04 (×6): qty 1

## 2013-11-04 MED ORDER — INSULIN ASPART 100 UNIT/ML ~~LOC~~ SOLN
0.0000 [IU] | Freq: Three times a day (TID) | SUBCUTANEOUS | Status: DC
Start: 2013-11-04 — End: 2013-11-06
  Administered 2013-11-04: 3 [IU] via SUBCUTANEOUS
  Administered 2013-11-05 (×2): 4 [IU] via SUBCUTANEOUS
  Administered 2013-11-05: 3 [IU] via SUBCUTANEOUS
  Administered 2013-11-06: 4 [IU] via SUBCUTANEOUS
  Administered 2013-11-06: 3 [IU] via SUBCUTANEOUS

## 2013-11-04 NOTE — Progress Notes (Signed)
Physical Therapy Treatment Patient Details Name: Riley Stuart MRN: [id number] DOB: 1951/01/20 Today's Date: 11/04/2013 Time: [id number] PT Time Calculation (min): 30 min  PT Assessment / Plan / Recommendation  History of Present Illness Pt with SAH; s/p clipping ACOM ANeurysm   PT Comments   Pt is progressing towards functional goals. Pt amb twice the distance as last tx and continued to self limit distance. Verbal and tactile cues were used to encourage progression in amb and safety. Pt remains to be a good candidate for CIR. Will continue to follow per POC.   Follow Up Recommendations  CIR     Does the patient have the potential to tolerate intense rehabilitation     Barriers to Discharge        Equipment Recommendations  Rolling walker with 5" wheels    Recommendations for Other Services    Frequency Min 3X/week   Progress towards PT Goals Progress towards PT goals: Progressing toward goals  Plan Frequency needs to be updated    Precautions / Restrictions Precautions Precautions: Fall Precaution Comments: pt with known psychosis Restrictions Weight Bearing Restrictions: No   Pertinent Vitals/Pain Reports mild headache rated about the same as yesterday    Mobility  Bed Mobility Overal bed mobility: Needs Assistance Bed Mobility: Supine to Sit Supine to sit: Supervision General bed mobility comments: pt able to perform tasks with verbal cuing Transfers Overall transfer level: Needs assistance Equipment used: Rolling walker (2 wheeled) Transfers: Sit to/from Stand Sit to Stand: Min guard General transfer comment: Pt required verbal cues and a two-wheel rolling walker to perform transfers. Pt req verbal cues for safety and hand placement. Min A was given for safety   Ambulation/Gait Ambulation/Gait assistance: Min assist Ambulation Distance (Feet): 40 Feet Assistive device: Rolling walker (2 wheeled) Gait Pattern/deviations: Step-to pattern;Shuffle;Decreased  stride length Gait velocity: slow and guarded to prevent falling General Gait Details: pt self limited his amb distance. pt unwilling to take larger steps.  Modified Rankin (Stroke Patients Only) Pre-Morbid Rankin Score: No significant disability Modified Rankin: Moderately severe disability    Exercises     PT Diagnosis:    PT Problem List:   PT Treatment Interventions:     PT Goals (current goals can now be found in the care plan section) Acute Rehab PT Goals Patient Stated Goal: Return home  PT Goal Formulation: With patient Time For Goal Achievement: 11/10/13 Potential to Achieve Goals: Good  Visit Information  Last PT Received On: 11/04/13 Assistance Needed: +1 History of Present Illness: Pt with SAH; s/p clipping ACOM ANeurysm    Subjective Data  Patient Stated Goal: Return home    Cognition  Cognition Arousal/Alertness: Awake/alert Behavior During Therapy: WFL for tasks assessed/performed Overall Cognitive Status: Within Functional Limits for tasks assessed    Balance  Balance Overall balance assessment: Needs assistance Sitting-balance support: No upper extremity supported Sitting balance-Leahy Scale: Good Sitting balance - Comments: pt pulls self forward to come to unsupoorted sitting EOB Standing balance support: Bilateral upper extremity supported Standing balance-Leahy Scale: Fair Standing balance comment: req RW  End of Session PT - End of Session Equipment Utilized During Treatment: Gait belt (RW) Activity Tolerance: Patient tolerated treatment well Patient left: in chair;with call bell/phone within reach Nurse Communication: Mobility status   GP     Manley Mason SPT 11/04/2013, 1:06 PM  Agree with above assessment.  Kittie Plater, PT, DPT Pager #: [phone] Office #: [phone]

## 2013-11-04 NOTE — Progress Notes (Signed)
Pt seen and examined. No current c/o. Doing well, sitting in bedside chair. Ambulated with walker with PT today.  EXAM: Temp:  [98 F (36.7 C)-99.4 F (37.4 C)] 99 F (37.2 C) (03/11 1200) Pulse Rate:  [42-78] 67 (03/11 1200) Resp:  [14-27] 15 (03/11 1200) BP: (126-167)/(68-100) 134/87 mmHg (03/11 1200) SpO2:  [95 %-100 %] 97 % (03/11 1200) Intake/Output     03/10 0701 - 03/11 0700 03/11 0701 - 03/12 0700   P.O. 924    I.V. (mL/kg) 1800 (14.7) 375 (3.1)   Total Intake(mL/kg) 2724 (22.3) 375 (3.1)   Urine (mL/kg/hr) 4225 (1.4) 350 (0.5)   Total Output 4225 350   Net -1501 +25         Awake, alert, oriented Speech fluent CN intact No drift, good strength throughout Wound c/d/i  LABS: Lab Results  Component Value Date   CREATININE 1.24 11/03/2013   BUN 14 11/03/2013   NA 134* 11/03/2013   K 3.8 11/03/2013   CL 99 11/03/2013   CO2 21 11/03/2013   Lab Results  Component Value Date   WBC 10.2 11/03/2013   HGB 12.2* 11/03/2013   HCT 34.0* 11/03/2013   MCV 87.6 11/03/2013   PLT 385 11/03/2013    IMAGING: No new imaging  IMPRESSION: - 63 y.o. 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 Cont Nimotop, Zocor - Increase IVF, maintain euvolemia - cont current mgmt - Cont PT/OT for mobilization

## 2013-11-04 NOTE — Progress Notes (Signed)
Transcranial Doppler  Date POD PCO2 HCT BP  MCA ACA PCA OPHT SIPH VERT Basilar  10/30/13 MS     Right  Left   *  16   *  -28   *  *   20  17   *  -39   *  *   *      11/02/13 MS     Right  Left   *        24 *         -29 *         22 10        20  *         28 -27       -26 -26      11-04-13 hc     Right  Left     87     -29     20   21  24   19  29    -39  -34     -34          Right  Left                                             Right  Left                                            Right  Left                                            Right  Left                                        MCA = Middle Cerebral Artery      OPHT = Opthalmic Artery     BASILAR = Basilar Artery   ACA = Anterior Cerebral Artery     SIPH = Carotid Siphon PCA = Posterior Cerebral Artery   VERT = Verterbral Artery                   Normal MCA = 62+\-12 ACA = 50+\-12 PCA = 42+\-23   *Unable to insonate, study was technically limited due to poor acoustic windows.  Ralene Cork, RVT 11/04/2013 4:43 PM

## 2013-11-04 NOTE — Progress Notes (Signed)
UR completed. Plan for CIR vs SNF at d/c depending on pt's abilities.   Sandi Mariscal, RN BSN [facility] CCM Trauma/Neuro ICU Case Manager [phone]

## 2013-11-04 NOTE — Progress Notes (Signed)
Speech Language Pathology Treatment: Cognitive-Linquistic  Patient Details Name: SHAQUELLE PARATORE MRN: [id number] DOB: Aug 20, 1951 Today's Date: 11/04/2013 Time: [id number] SLP Time Calculation (min): 13 min  Assessment / Plan / Recommendation Clinical Impression  Pt. seen for skilled cognitive treatment/cognitive reorganization.  He required moderate cueing to verbally problem solve typical hospital situations and for intellectutal/anticipatory awareness of abilities/impairments. No assist needed during writing activity of biographical information.  SLP is uncertain of pt.'s baseline cognitive abilities that may impact function.  Pt. Would benefit from inpatient rehab to maximize independence.   HPI HPI: 63 yr old patient who lives in a group home seen at [facility] with the complain of headache and dizziness for 3 days. Patient denies any history of weakness, memory loss.  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   Pertinent Vitals WDL  SLP Plan  Continue with current plan of care    Recommendations  CIR              General recommendations: Rehab consult Oral Care Recommendations: Oral care BID Follow up Recommendations: Inpatient Rehab Plan: Continue with current plan of care    GO     [place] Siren M.Ed [employer] [phone]  11/04/2013

## 2013-11-04 NOTE — Progress Notes (Signed)
Physical Therapy Treatment Patient Details Name: Riley Stuart MRN: [id number] DOB: September 13, 1950 Today's Date: 11/04/2013 Time: [id number] PT Time Calculation (min): 30 min  PT Assessment / Plan / Recommendation  History of Present Illness Pt with SAH; s/p clipping ACOM ANeurysm   PT Comments   Pt is progressing towards functional goals. Pt amb twice the distance as last tx and continued to self limit distance. Verbal and tactile cues were used to encourage progression in amb and safety. Pt remains to be a good candidate for CIR. Will continue to follow per POC.   Follow Up Recommendations  CIR     Does the patient have the potential to tolerate intense rehabilitation     Barriers to Discharge        Equipment Recommendations  Rolling walker with 5" wheels    Recommendations for Other Services    Frequency Min 3X/week   Progress towards PT Goals Progress towards PT goals: Progressing toward goals  Plan Current plan remains appropriate    Precautions / Restrictions Precautions Precautions: Fall Precaution Comments: pt with known psychosis Restrictions Weight Bearing Restrictions: No   Pertinent Vitals/Pain Reports mild headache rated about the same as yesterday    Mobility  Bed Mobility Overal bed mobility: Needs Assistance Bed Mobility: Supine to Sit Supine to sit: Supervision General bed mobility comments: pt able to perform tasks with verbal cuing Transfers Overall transfer level: Needs assistance Equipment used: Rolling walker (2 wheeled) Transfers: Sit to/from Stand Sit to Stand: Min guard General transfer comment: Pt required verbal cues and a two-wheel rolling walker to perform transfers. Pt req verbal cues for safety and hand placement. Min A was given for safety   Ambulation/Gait Ambulation/Gait assistance: Min assist Ambulation Distance (Feet): 40 Feet Assistive device: Rolling walker (2 wheeled) Gait Pattern/deviations: Step-to  pattern;Shuffle;Decreased stride length Gait velocity: slow and guarded to prevent falling General Gait Details: pt self limited his amb distance. pt unwilling to take larger steps.     Exercises     PT Diagnosis:    PT Problem List:   PT Treatment Interventions:     PT Goals (current goals can now be found in the care plan section) Acute Rehab PT Goals Patient Stated Goal: Return home  PT Goal Formulation: With patient Time For Goal Achievement: 11/10/13 Potential to Achieve Goals: Good  Visit Information  Last PT Received On: 11/04/13 Assistance Needed: +1 History of Present Illness: Pt with SAH; s/p clipping ACOM ANeurysm    Subjective Data  Patient Stated Goal: Return home    Cognition  Cognition Arousal/Alertness: Awake/alert Behavior During Therapy: WFL for tasks assessed/performed Overall Cognitive Status: Within Functional Limits for tasks assessed    Balance  Balance Overall balance assessment: Needs assistance Sitting-balance support: No upper extremity supported Sitting balance-Leahy Scale: Good Sitting balance - Comments: pt pulls self forward to come to unsupoorted sitting EOB Standing balance support: Bilateral upper extremity supported Standing balance-Leahy Scale: Fair Standing balance comment: req RW  End of Session PT - End of Session Equipment Utilized During Treatment: Back brace (RW) Activity Tolerance: Patient tolerated treatment well Patient left: in chair;with call bell/phone within reach Nurse Communication: Mobility status   GP     Cecile Guevara SPT 11/04/2013, 11:19 AM

## 2013-11-05 ENCOUNTER — Inpatient Hospital Stay (HOSPITAL_COMMUNITY): Payer: Medicare Other

## 2013-11-05 LAB — GLUCOSE, CAPILLARY
GLUCOSE-CAPILLARY: 132 mg/dL — AB (ref 70–99)
GLUCOSE-CAPILLARY: 164 mg/dL — AB (ref 70–99)
GLUCOSE-CAPILLARY: 140 mg/dL — AB (ref 70–99)
Glucose-Capillary: 164 mg/dL — ABNORMAL HIGH (ref 70–99)
Glucose-Capillary: 173 mg/dL — ABNORMAL HIGH (ref 70–99)

## 2013-11-05 LAB — URINALYSIS, ROUTINE W REFLEX MICROSCOPIC
Bilirubin Urine: NEGATIVE
GLUCOSE, UA: NEGATIVE mg/dL
Ketones, ur: NEGATIVE mg/dL
Leukocytes, UA: NEGATIVE
Nitrite: NEGATIVE
Protein, ur: 30 mg/dL — AB
Specific Gravity, Urine: 1.01 (ref 1.005–1.030)
Urobilinogen, UA: 0.2 mg/dL (ref 0.0–1.0)
pH: 6 (ref 5.0–8.0)

## 2013-11-05 LAB — CBC
HEMATOCRIT: 35.5 % — AB (ref 39.0–52.0)
HEMOGLOBIN: 12.9 g/dL — AB (ref 13.0–17.0)
MCH: 32.1 pg (ref 26.0–34.0)
MCHC: 36.3 g/dL — ABNORMAL HIGH (ref 30.0–36.0)
MCV: 88.3 fL (ref 78.0–100.0)
Platelets: 431 10*3/uL — ABNORMAL HIGH (ref 150–400)
RBC: 4.02 MIL/uL — AB (ref 4.22–5.81)
RDW: 14.4 % (ref 11.5–15.5)
WBC: 9.9 10*3/uL (ref 4.0–10.5)

## 2013-11-05 LAB — BASIC METABOLIC PANEL
BUN: 13 mg/dL (ref 6–23)
CHLORIDE: 100 meq/L (ref 96–112)
CO2: 21 meq/L (ref 19–32)
Calcium: 9.1 mg/dL (ref 8.4–10.5)
Creatinine, Ser: 1.17 mg/dL (ref 0.50–1.35)
GFR calc non Af Amer: 65 mL/min — ABNORMAL LOW (ref >90)
GFR, EST AFRICAN AMERICAN: 75 mL/min — AB (ref >90)
Glucose, Bld: 218 mg/dL — ABNORMAL HIGH (ref 70–99)
POTASSIUM: 4.1 meq/L (ref 3.7–5.3)
Sodium: 137 mEq/L (ref 137–147)

## 2013-11-05 LAB — URINE MICROSCOPIC-ADD ON

## 2013-11-05 NOTE — Progress Notes (Signed)
I am hopeful to admit pt to inpt rehab once medically ready if bed available. I will follow up tomorrow. [id number]

## 2013-11-05 NOTE — Clinical Social Work Note (Signed)
Clinical Social Worker continuing to follow patient and family for support and discharge planning needs.  Patient is being observed in ICU for spasm monitoring.  Patient and family are hopeful for placement at inpatient rehab once patient is medically stable for discharge.  CSW to update [facility] once plans are established.  CSW remains available for support and to assist with patient needs at discharge if needed.  Barbette Or, [place]

## 2013-11-05 NOTE — Progress Notes (Signed)
Pt seen and examined. No current c/o x mild HA. No issues overnight.  EXAM: Temp:  [98.8 F (37.1 C)-100.9 F (38.3 C)] 100.9 F (38.3 C) (03/12 0400) Pulse Rate:  [51-151] 65 (03/12 0800) Resp:  [13-24] 17 (03/12 0800) BP: (134-170)/(66-95) 156/95 mmHg (03/12 0800) SpO2:  [94 %-100 %] 98 % (03/12 0800) Intake/Output     03/11 0701 - 03/12 0700 03/12 0701 - 03/13 0700   P.O. 900    I.V. (mL/kg) 2438.3 (20) 375 (3.1)   Total Intake(mL/kg) 3338.3 (27.3) 375 (3.1)   Urine (mL/kg/hr) 3900 (1.3) 800 (2.7)   Total Output 3900 800   Net -561.7 -425         Awake, alert, oriented Speech fluent CN intact No drift, good strength throughout Wound c/d/i  LABS: Lab Results  Component Value Date   CREATININE 1.24 11/03/2013   BUN 14 11/03/2013   NA 134* 11/03/2013   K 3.8 11/03/2013   CL 99 11/03/2013   CO2 21 11/03/2013   Lab Results  Component Value Date   WBC 10.2 11/03/2013   HGB 12.2* 11/03/2013   HCT 34.0* 11/03/2013   MCV 87.6 11/03/2013   PLT 385 11/03/2013    IMAGING: TCD velocities reviewed, not concerning for spasm.  IMPRESSION: - 63 y.o. male [place] d#11, POD# 78s/p clipping Acom aneurysm, neurologically at baseline - Chronic renal insufficiency likely sec to HTN/DM - febrile this am  PLAN: - Cont close observation - Spasm monitoring - TCD. Cont Nimotop, Zocor - maintain euvolemia - Will check cbc, lytes, blood cx, urine cx, chest xr. Tylenol for temp. - Cont PT/OT for mobilization

## 2013-11-05 NOTE — Progress Notes (Signed)
Occupational Therapy Treatment Patient Details Name: Riley Stuart MRN: [id number] DOB: Sep 13, 1950 Today's Date: 11/05/2013 Time: [id number] OT Time Calculation (min): 29 min  OT Assessment / Plan / Recommendation  History of present illness Pt with SAH; s/p clipping ACOM ANeurysm   OT comments  Pt making steady progress with mobility and ADL; however, will benefit from CIR to return to PLOF (Mod I). Pt continues to require mod A for ADL and min A for mobility. Pt appears to demonstrate cognitive decline from baseline. Will follow acutely to facilitate D?C to CIR.   Follow Up Recommendations  CIR;Supervision/Assistance - 24 hour    Barriers to Discharge       Equipment Recommendations  3 in 1 bedside comode;Tub/shower seat    Recommendations for Other Services Rehab consult  Frequency Min 2X/week   Progress towards OT Goals Progress towards OT goals: Progressing toward goals  Plan Discharge plan remains appropriate    Precautions / Restrictions Precautions Precautions: Fall Precaution Comments: pt with known psychosis Restrictions Weight Bearing Restrictions: No   Pertinent Vitals/Pain Tachy with HR 130s at times in standing. No c/o pain    ADL  Grooming: Wash/dry hands;Wash/dry face (mod vc for task completion) Where Assessed - Grooming: Supported standing Upper Body Bathing: Chest;Right arm;Left arm;Abdomen;Supervision/safety;Set up Where Assessed - Upper Body Bathing: Unsupported sitting Lower Body Bathing: Minimal assistance Where Assessed - Lower Body Bathing: Supported sit to stand Upper Body Dressing: Minimal assistance Where Assessed - Upper Body Dressing: Unsupported sitting Transfers/Ambulation Related to ADLs: Pt transfered in/out of bed with min A. Ambulated to sink with min A +2 HHA ADL Comments: DL Comments- pt needed verbal/physical cues to remain standing up right and to complete activities safely.Pt was able to perform part of a bathing task  standing at the sink with mod A for maintaining upright postural control due to attentional deficits and apparent motor impersistence.  Pt with poor standing tolerance for ADL. -pt was unable to complete task once seated.     OT Diagnosis:    OT Problem List:   OT Treatment Interventions:     OT Goals(current goals can now be found in the care plan section) Acute Rehab OT Goals Patient Stated Goal: Return home  OT Goal Formulation: With patient Time For Goal Achievement: 11/16/13 Potential to Achieve Goals: Good ADL Goals Pt Will Perform Lower Body Bathing: with modified independence;sit to/from stand Pt Will Perform Lower Body Dressing: with modified independence;sit to/from stand Pt Will Transfer to Toilet: with supervision;bedside commode;ambulating Pt Will Perform Toileting - Clothing Manipulation and hygiene: with modified independence;sit to/from stand Pt Will Perform Tub/Shower Transfer: with supervision;shower seat;ambulating Pt/caregiver will Perform Home Exercise Program: Increased strength;Both right and left upper extremity;With Supervision  Visit Information  Last OT Received On: 11/05/13 Assistance Needed: +1 History of Present Illness: Pt with SAH; s/p clipping ACOM ANeurysm    Subjective Data      Prior Functioning    mod I   Cognition  Cognition Arousal/Alertness: Awake/alert Behavior During Therapy: Agitated (at times - unsure of baseline behavioral issues) Overall Cognitive Status: Impaired/Different from baseline Area of Impairment: Safety/judgement;Awareness;Attention Current Attention Level: Selective Safety/Judgement: Decreased awareness of safety;Decreased awareness of deficits Awareness: Emergent General Comments: Feel pt has further impairments than his baseline functional capacity after pariticipating in ADL with pt.     Mobility  Bed Mobility Overal bed mobility: Needs Assistance Bed Mobility: Supine to Sit Supine to sit: Min  guard Transfers Overall transfer level: Needs assistance Transfers: Sit  to/from Stand Sit to Stand: Min assist Stand pivot transfers: Min assist    Exercises      Balance Balance Overall balance assessment: Needs assistance Sitting-balance support: No upper extremity supported Sitting balance-Leahy Scale: Good Sitting balance - Comments: good, leans to the right but can pull self to EOB  Standing balance support: Bilateral upper extremity supported Standing balance-Leahy Scale: Poor Standing balance comment: Pt demonstrates motor imipersistnece during dynamic standing tasks. Postural control impacted by poor attention to task.  End of Session OT - End of Session Equipment Utilized During Treatment: Gait belt Activity Tolerance: Patient tolerated treatment well (Unable to complete bathing standing due to standing fatigue) Patient left: in chair;with call bell/phone within reach Nurse Communication: Mobility status  GO     Brylan Seubert,HILLARY 11/05/2013, 4:50 PM [facility], OTR/L  [phone] 11/05/2013

## 2013-11-06 ENCOUNTER — Inpatient Hospital Stay (HOSPITAL_COMMUNITY)
Admission: RE | Admit: 2013-11-06 | Discharge: 2013-11-13 | DRG: 945 | Disposition: A | Discharge status: Home Health | Payer: Medicare Other | Source: Intra-hospital | Attending: Physical Medicine & Rehabilitation | Admitting: Physical Medicine & Rehabilitation

## 2013-11-06 DIAGNOSIS — E119 Type 2 diabetes mellitus without complications: Secondary | ICD-10-CM

## 2013-11-06 DIAGNOSIS — I129 Hypertensive chronic kidney disease with stage 1 through stage 4 chronic kidney disease, or unspecified chronic kidney disease: Secondary | ICD-10-CM | POA: Diagnosis present

## 2013-11-06 DIAGNOSIS — N189 Chronic kidney disease, unspecified: Secondary | ICD-10-CM | POA: Diagnosis present

## 2013-11-06 DIAGNOSIS — F259 Schizoaffective disorder, unspecified: Secondary | ICD-10-CM | POA: Diagnosis present

## 2013-11-06 DIAGNOSIS — Z5189 Encounter for other specified aftercare: Principal | ICD-10-CM

## 2013-11-06 DIAGNOSIS — F17200 Nicotine dependence, unspecified, uncomplicated: Secondary | ICD-10-CM | POA: Diagnosis present

## 2013-11-06 DIAGNOSIS — I607 Nontraumatic subarachnoid hemorrhage from unspecified intracranial artery: Secondary | ICD-10-CM

## 2013-11-06 DIAGNOSIS — Z794 Long term (current) use of insulin: Secondary | ICD-10-CM

## 2013-11-06 DIAGNOSIS — I609 Nontraumatic subarachnoid hemorrhage, unspecified: Secondary | ICD-10-CM

## 2013-11-06 LAB — GLUCOSE, CAPILLARY
GLUCOSE-CAPILLARY: 151 mg/dL — AB (ref 70–99)
Glucose-Capillary: 149 mg/dL — ABNORMAL HIGH (ref 70–99)
Glucose-Capillary: 179 mg/dL — ABNORMAL HIGH (ref 70–99)
Glucose-Capillary: 127 mg/dL — ABNORMAL HIGH (ref 70–99)

## 2013-11-06 MED ORDER — ACETAMINOPHEN 325 MG PO TABS
325.0000 mg | ORAL_TABLET | ORAL | Status: DC | PRN
  Administered 2013-11-07 – 2013-11-12 (×3): 650 mg via ORAL
  Filled 2013-11-06 (×3): qty 2

## 2013-11-06 MED ORDER — OXYCODONE HCL 5 MG PO TABS
5.0000 mg | ORAL_TABLET | ORAL | Status: DC | PRN
  Administered 2013-11-06 (×2): 5 mg via ORAL
  Administered 2013-11-08 – 2013-11-12 (×4): 10 mg via ORAL
  Filled 2013-11-06 (×3): qty 2
  Filled 2013-11-06: qty 1
  Filled 2013-11-06: qty 2
  Filled 2013-11-06: qty 1

## 2013-11-06 MED ORDER — SIMVASTATIN 80 MG PO TABS
80.0000 mg | ORAL_TABLET | Freq: Every day | ORAL | Status: DC
  Administered 2013-11-06 – 2013-11-12 (×7): 80 mg via ORAL
  Filled 2013-11-06 (×8): qty 1

## 2013-11-06 MED ORDER — BISACODYL 10 MG RE SUPP: 10.0000 mg | Freq: Every day | RECTAL | Status: DC | PRN

## 2013-11-06 MED ORDER — PROCHLORPERAZINE EDISYLATE 5 MG/ML IJ SOLN
5.0000 mg | Freq: Four times a day (QID) | INTRAMUSCULAR | Status: DC | PRN
  Filled 2013-11-06: qty 2

## 2013-11-06 MED ORDER — FLEET ENEMA 7-19 GM/118ML RE ENEM: 1.0000 | ENEMA | Freq: Once | RECTAL | Status: AC | PRN

## 2013-11-06 MED ORDER — INSULIN ASPART 100 UNIT/ML ~~LOC~~ SOLN
0.0000 [IU] | Freq: Three times a day (TID) | SUBCUTANEOUS | Status: DC
  Administered 2013-11-06: 4 [IU] via SUBCUTANEOUS
  Administered 2013-11-07 (×3): 3 [IU] via SUBCUTANEOUS
  Administered 2013-11-08: 4 [IU] via SUBCUTANEOUS
  Administered 2013-11-08 – 2013-11-09 (×5): 3 [IU] via SUBCUTANEOUS
  Administered 2013-11-10: 4 [IU] via SUBCUTANEOUS
  Administered 2013-11-10 – 2013-11-11 (×5): 3 [IU] via SUBCUTANEOUS
  Administered 2013-11-12: 4 [IU] via SUBCUTANEOUS
  Administered 2013-11-12: 3 [IU] via SUBCUTANEOUS
  Administered 2013-11-12: 4 [IU] via SUBCUTANEOUS
  Administered 2013-11-13: 3 [IU] via SUBCUTANEOUS
  Administered 2013-11-13: 4 [IU] via SUBCUTANEOUS

## 2013-11-06 MED ORDER — METOPROLOL TARTRATE 12.5 MG HALF TABLET
12.5000 mg | ORAL_TABLET | Freq: Two times a day (BID) | ORAL | Status: DC
  Administered 2013-11-06 – 2013-11-13 (×14): 12.5 mg via ORAL
  Filled 2013-11-06 (×17): qty 1

## 2013-11-06 MED ORDER — GUAIFENESIN-DM 100-10 MG/5ML PO SYRP: 5.0000 mL | ORAL_SOLUTION | Freq: Four times a day (QID) | ORAL | Status: DC | PRN

## 2013-11-06 MED ORDER — POLYETHYLENE GLYCOL 3350 17 G PO PACK
17.0000 g | PACK | Freq: Every day | ORAL | Status: DC | PRN
  Filled 2013-11-06: qty 1

## 2013-11-06 MED ORDER — ALUM & MAG HYDROXIDE-SIMETH 200-200-20 MG/5ML PO SUSP: 30.0000 mL | ORAL | Status: DC | PRN

## 2013-11-06 MED ORDER — DIPHENHYDRAMINE HCL 12.5 MG/5ML PO ELIX
12.5000 mg | ORAL_SOLUTION | Freq: Four times a day (QID) | ORAL | Status: DC | PRN
  Administered 2013-11-08 – 2013-11-12 (×4): 25 mg via ORAL
  Filled 2013-11-06 (×4): qty 10

## 2013-11-06 MED ORDER — NIMODIPINE 30 MG PO CAPS
60.0000 mg | ORAL_CAPSULE | ORAL | Status: DC
  Administered 2013-11-06 – 2013-11-12 (×35): 60 mg via ORAL
  Filled 2013-11-06 (×48): qty 2

## 2013-11-06 MED ORDER — INSULIN ASPART PROT & ASPART (70-30 MIX) 100 UNIT/ML ~~LOC~~ SUSP
5.0000 [IU] | Freq: Two times a day (BID) | SUBCUTANEOUS | Status: DC
  Administered 2013-11-06 – 2013-11-12 (×13): 5 [IU] via SUBCUTANEOUS
  Filled 2013-11-06: qty 10

## 2013-11-06 MED ORDER — PROCHLORPERAZINE MALEATE 5 MG PO TABS
5.0000 mg | ORAL_TABLET | Freq: Four times a day (QID) | ORAL | Status: DC | PRN
  Filled 2013-11-06: qty 2

## 2013-11-06 MED ORDER — INSULIN ASPART 100 UNIT/ML ~~LOC~~ SOLN: 0.0000 [IU] | Freq: Every day | SUBCUTANEOUS | Status: DC

## 2013-11-06 MED ORDER — NIMODIPINE 30 MG PO CAPS: 60.0000 mg | ORAL_CAPSULE | ORAL | Status: DC

## 2013-11-06 MED ORDER — ENOXAPARIN SODIUM 40 MG/0.4ML ~~LOC~~ SOLN
40.0000 mg | SUBCUTANEOUS | Status: DC
  Administered 2013-11-06 – 2013-11-12 (×7): 40 mg via SUBCUTANEOUS
  Filled 2013-11-06 (×8): qty 0.4

## 2013-11-06 MED ORDER — SENNOSIDES-DOCUSATE SODIUM 8.6-50 MG PO TABS
1.0000 | ORAL_TABLET | Freq: Two times a day (BID) | ORAL | Status: DC
  Administered 2013-11-06 – 2013-11-13 (×14): 1 via ORAL
  Filled 2013-11-06 (×14): qty 1

## 2013-11-06 MED ORDER — PROCHLORPERAZINE 25 MG RE SUPP
12.5000 mg | Freq: Four times a day (QID) | RECTAL | Status: DC | PRN
  Filled 2013-11-06: qty 1

## 2013-11-06 MED ORDER — ZIPRASIDONE HCL 80 MG PO CAPS
80.0000 mg | ORAL_CAPSULE | Freq: Two times a day (BID) | ORAL | Status: DC
  Administered 2013-11-06 – 2013-11-08 (×5): 80 mg via ORAL
  Filled 2013-11-06 (×8): qty 1

## 2013-11-06 MED ORDER — PANTOPRAZOLE SODIUM 40 MG PO TBEC
40.0000 mg | DELAYED_RELEASE_TABLET | Freq: Every day | ORAL | Status: DC
  Administered 2013-11-06 – 2013-11-12 (×7): 40 mg via ORAL
  Filled 2013-11-06 (×7): qty 1

## 2013-11-06 NOTE — PMR Pre-admission (Signed)
PMR Admission Coordinator Pre-Admission Assessment  Patient: Riley Stuart is an 63 y.o., male MRN: [id number] DOB: 02-16-1951 Height: 6\' 2"  (188 cm) Weight: 122.1 kg (269 lb 2.9 oz)              Insurance Information HMO:    PPO:      PCP:      IPA:      80/20: yes     OTHER: no HMO PRIMARY: Medicare a and b      Policy#: [id number]      Subscriber: pt Eff. Date: 01/26/91     Deduct: $1260      Out of Pocket Max: none      Life Max: none CIR: 100%      SNF: 20 full days Outpatient: 80%     Co-Pay: 20% Home Health: 100%      Co-Pay: none DME: 80%     Co-Pay: 20% Providers: pt choice  SECONDARY: Medicaid [place] Access      Policy#: [id number]      Subscriber: pt    Emergency Contact Information Contact Information   Name Relation Home Work Mobile   [place] Brother [phone]  [phone]     Current Medical History  Patient Admitting Diagnosis: SAH d/t A-com aneurysm  History of Present Illness: Riley Stuart is a 63 y.o. male with history of DM, CKD, schizoaffective disorder. He was admitted to [place] from his group home on 10/26/13 with three day h/o dizziness and headache. CT head with SAH. 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 Kathyrn Sheriff on 10/27/13. He developed pink tinged drainage from nose yesterday and was placed on bedrest. Placed on nimotop with TCD done to monitor for vasospasms. Pan cultured 11/05/13 for continued fevers.  Past Medical History  Past Medical History  Diagnosis Date  . Diabetes mellitus without complication   . Mental disorder     schizoaffective  . Hypertension   . Chronic kidney disease     renal insufficiency    Family History  family history is not on file.  Prior Rehab/Hospitalizations: none  Current Medications  Current facility-administered medications:0.9 %   sodium chloride infusion, , Intravenous, Continuous, Consuella Lose, MD, Last Rate: 125 mL/hr at 11/06/13 0600;  acetaminophen (TYLENOL) suppository 650 mg, 650 mg, Rectal, Q4H PRN, Floyce Stakes, MD;  acetaminophen (TYLENOL) tablet 650 mg, 650 mg, Oral, Q4H PRN, Floyce Stakes, MD, 650 mg at 11/02/13 1941 heparin injection 5,000 Units, 5,000 Units, Subcutaneous, 3 times per day, Consuella Lose, MD, 5,000 Units at 11/06/13 [phone];  influenza vac split quadrivalent PF (FLUARIX) injection 0.5 mL, 0.5 mL, Intramuscular, Tomorrow-1000, Floyce Stakes, MD;  insulin aspart (novoLOG) injection 0-20 Units, 0-20 Units, Subcutaneous, TID WC, Consuella Lose, MD, 4 Units at 11/06/13 0854 insulin aspart (novoLOG) injection 0-5 Units, 0-5 Units, Subcutaneous, QHS, Consuella Lose, MD;  labetalol (NORMODYNE,TRANDATE) injection 10-40 mg, 10-40 mg, Intravenous, Q10 min PRN, Floyce Stakes, MD, 20 mg at 11/05/13 1445;  morphine 2 MG/ML injection 1-4 mg, 1-4 mg, Intravenous, Q1H PRN, Floyce Stakes, MD, 2 mg at 10/30/13 1650 niCARdipine (CARDENE-IV) infusion (0.1 mg/ml), 3-15 mg/hr, Intravenous, Continuous, Floyce Stakes, MD, Last Rate: 150 mL/hr at 10/27/13 1410, 15 mg/hr at 10/27/13 1410;  niMODipine (NIMOTOP) capsule 60 mg, 60 mg, Oral, Q4H, Floyce Stakes, MD, 60 mg at 11/06/13 0855;  pantoprazole (PROTONIX)  EC tablet 40 mg, 40 mg, Oral, QHS, Benjamine Sprague Jansen, RPH, 40 mg at 11/05/13 2230 senna-docusate (Senokot-S) tablet 1 tablet, 1 tablet, Oral, BID, Floyce Stakes, MD, 1 tablet at 11/04/13 2159;  simvastatin (ZOCOR) tablet 80 mg, 80 mg, Oral, QHS, Consuella Lose, MD, 80 mg at 11/05/13 2230;  ziprasidone (GEODON) capsule 80 mg, 80 mg, Oral, BID WC, Consuella Lose, MD, 80 mg at 11/06/13 0855  Patients Current Diet: Carb Control  Precautions / Restrictions Precautions Precautions: Fall Precaution Comments: pt with known psychosis Restrictions Weight Bearing Restrictions: No   Prior  Activity Level Limited Community (1-2x/wk): Has lived in [place] home for over 15 years Until 2 years ago 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 Never married and no children  Development worker, international aid / [place] Devices/Equipment: Eyeglasses;Grab bars around toilet;Grab bars in shower  Prior Functional Level Prior Function Level of Independence: Independent (pt was independent without AD pta) Gait / Transfers Assistance Needed: Pt stated he was formerly Independent for all ADLs ADL's / Homemaking Assistance Needed: Pt stated he was formerly Independent for cooking task. Comments: staff provided meals and medications  Current Functional Level Cognition  Arousal/Alertness: Awake/alert Overall Cognitive Status: Impaired/Different from baseline Current Attention Level: Selective Orientation Level: Oriented X4 Safety/Judgement: Decreased awareness of safety;Decreased awareness of deficits General Comments: Feel pt has further impairments than his baseline functional capacity after pariticipating in ADL with pt.  Attention: Sustained Sustained Attention: Appears intact Memory:  (TBA) Awareness: Appears intact Problem Solving: Appears intact Safety/Judgment:  (TBA)    Extremity Assessment (includes Sensation/Coordination)          ADLs       Mobility  Overal bed mobility: Needs Assistance Bed Mobility: Supine to Sit Rolling: Supervision Sidelying to sit: Min guard Supine to sit: Min guard Sit to supine: Min guard General bed mobility comments: pt able to perform tasks with verbal cuing    Transfers  Overall transfer level: Needs assistance Equipment used: Rolling walker (2 wheeled) Transfers: Sit to/from Stand Sit to Stand: Min assist Stand pivot transfers: Min assist General transfer comment: Pt required verbal cues and a two-wheel rolling walker to perform transfers. Pt req verbal cues for safety and hand placement. Min A was given  for safety      Ambulation / Gait / Stairs / Wheelchair Mobility  Ambulation/Gait Ambulation/Gait assistance: Museum/gallery curator (Feet): 40 Feet Assistive device: Rolling walker (2 wheeled) Gait Pattern/deviations: Step-to pattern;Shuffle;Decreased stride length Gait velocity: slow and guarded to prevent falling General Gait Details: pt self limited his amb distance. pt unwilling to take larger steps.     Posture / Balance Dynamic Sitting Balance Sitting balance - Comments: good, leans to the right but can pull self to EOB     Special needs/care consideration Continuous Drip IV yes Bowel mgmt: continent Bladder mgmt: condom catheter Diabetic mgmt yes   Previous Home Environment Living Arrangements: Other (Comment) (Lived at Group home for about 15 years; [place])  Lives With: Other (Comment) Available Help at Discharge: Other (Comment) (One person in Group home manages 6 people per home. ) Type of Home: [place] Name: [facility] Home Layout: One level Home Access: Level entry Bathroom Shower/Tub: Tub/shower unit;Other (comment) (1 bedroom apartment in group home with shared bathroom) Bathroom Toilet: Handicapped height Bathroom Accessibility: Yes How Accessible: Accessible via walker [place]: No Additional Comments: spoke with [name] From [facility] who reports they  can provide minimal physical assist to patient and they do have a w/c that pt can use until pt safe to ambulate. Staff present to assist with ADLs, meal prep and medical management.  Discharge Living Setting Plans for Discharge Living Setting: Other (Comment) ([facility]) Type of Home at Discharge: [place] Name at Discharge: [place] Discharge Home Layout: One level Discharge Home Access: Level entry Discharge Bathroom Shower/Tub: Tub/shower unit;Other (comment) (1 bedroom apartment in group home for pt and he shares a  bat) Discharge Bathroom Toilet: Handicapped height Discharge Bathroom Accessibility: Yes How Accessible: Accessible via walker Does the patient have any problems obtaining your medications?: No  Social/Family/Support Systems Contact Information: Elnora Schnelle, his brother and Annitta Needs at [place] Anticipated Caregiver: Lobbyist at [employer] who can provide 24/7 supervision level Anticipated Caregiver's Contact Information: see above Ability/Limitations of Caregiver: needs to be at supervision level to return to [place]. They can do onsite assessment of pt's functional level prior to d/c Caregiver Availability: 24/7 Discharge Plan Discussed with Primary Caregiver: Yes Is Caregiver In Agreement with Plan?: Yes Does Caregiver/Family have Issues with Lodging/Transportation while Pt is in Rehab?: No  Goals/Additional Needs Patient/Family Goal for Rehab: supervision level with PT, OT, and SLP Expected length of stay: [facility] 10-14 days Special Service Needs: Per [name] at Group home pt does not like females to assist bathing. He did totally independent pta. Patient can become easily agitated. Recommends begin behavior management by giving pt space, pt would not like females to assist with bathing and dressing, maintain tone of voice very calming and not exaggerated or loud.  Additional Information: Plan would be to do inpt rehab and then return to Group HOme at d/c. [name] or Festus Holts at group home can be contacted to do onsite assessment of pt prior to plan d/c to assess if pt at supervision level so that he can return.. I have obtained copy of med list from group home to provide to rehab MD to assure continuity of meds Pt/Family Agrees to Admission and willing to participate: Yes Program Orientation Provided & Reviewed with Pt/Caregiver Including Roles  & Responsibilities: Yes  Decrease burden of Care through IP rehab admission: not anticipated   Possible need for SNF placement upon  discharge:not anticipated but have discussed with brother and [name] at Group home that group home can do onsite assessment to see if pt at a level that he can return back to Group home prior to d/c or if he needed additional SNF.  Patient Condition: This patient's medical and functional status has changed since the consult dated: 10/30/13 in which the Rehabilitation Physician determined and documented that the patient's condition is appropriate for intensive rehabilitative care in an inpatient rehabilitation facility. See "History of Present Illness" (above) for medical update. Functional changes are: overall min assist level. Patient's medical and functional status update has been discussed with the Rehabilitation physician and patient remains appropriate for inpatient rehabilitation. Will admit to inpatient rehab today.  Preadmission Screen Completed By:  Cleatrice Burke, 11/06/2013 10:42 AM ______________________________________________________________________   Discussed status with Dr. Naaman Plummer on 11/06/13 at  1042  and received telephone approval for admission today.  Admission Coordinator:  Cleatrice Burke, time [id number] Date 11/06/13.

## 2013-11-06 NOTE — Discharge Summary (Signed)
Physician Discharge Summary  Patient ID: Riley Stuart MRN: [id number] DOB/AGE: [birth year] 63 y.o.  Admit date: 10/26/2013 Discharge date: 11/06/2013  Admission Diagnoses:  1. Subarachnoid hemorrhage     2. Hypokalemia     3. Schizoaffective Disorder   Discharge Diagnoses: Same Active Problems:   SAH (subarachnoid hemorrhage)   Discharged Condition: Stable  Hospital Course:  Riley Stuart is a 63 y.o. male admitted to the hospital after 3 days of headache at home. 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 He was taken to the operating room for clip ligation which was performed without complication. Postoperatively, the patient was at his neurologic baseline. He was observed in the neurointensive care unit where he had a relatively uncomplicated course. He had no issues with vasospasm which was monitored by clinical exam and transcranial Doppler. He was placed on routine vasospasm prophylaxis including Nimotop and Zocor. He was evaluated by physical and occupational therapy as well as physical medicine and rehabilitation physicians and was deemed a good candidate for comprehensive inpatient rehabilitation prior to discharge back to his group home. On subarachnoid hemorrhage day 12 and postoperative day #9 he was deemed stable for discharge.  Treatments: Surgery - left pterional craniotomy for clipping of anterior communicating artery aneurysm  Discharge Exam: Blood pressure 154/76, pulse 57, temperature 98.5 F (36.9 C), temperature source Oral, resp. rate 15, height 6\' 2"  (1.88 m), weight 122.1 kg (269 lb 2.9 oz), SpO2 96.00%. Awake, alert, oriented Speech fluent, appropriate CN grossly intact 5/5 BUE/BLE Wound c/d/i  Follow-up: Follow-up in my office [facility] [phone]) in 2-3 weeks  Disposition:  Comprehensive inpatient rehabilitation     Medication List         acetaminophen 325 MG tablet  Commonly known as:  TYLENOL  Take 650 mg by mouth every 6 (six) hours as needed for mild pain or headache.     calcium-vitamin D 500-200 MG-UNIT per tablet  Commonly known as:  OSCAL WITH D  Take 1 tablet by mouth 2 (two) times daily.     fenofibrate 145 MG tablet  Commonly known as:  TRICOR  Take 145 mg by mouth daily.     insulin lispro protamine-lispro (75-25) 100 UNIT/ML Susp injection  Commonly known as:  HUMALOG 75/25 MIX  Inject 35-50 Units into the skin 2 (two) times daily with a meal. Use 50 units in the morning and 35 units in the evening at 5pm     lamoTRIgine 100 MG tablet  Commonly known as:  LAMICTAL  Take 100 mg by mouth 2 (two) times daily.     metoprolol succinate 100 MG 24 hr tablet  Commonly known as:  TOPROL-XL  Take 100 mg by mouth daily. Take with or immediately following a meal.     niMODipine 30 MG capsule  Commonly known as:  NIMOTOP  Take 2 capsules (60 mg total) by mouth every 4 (four) hours.     omeprazole 20 MG capsule  Commonly known as:  PRILOSEC  Take 20 mg by mouth daily.     traMADol 50 MG tablet  Commonly known as:  ULTRAM  Take 50 mg by mouth 2 (two) times daily as needed for moderate pain.     valsartan-hydrochlorothiazide 320-25 MG per tablet  Commonly known as:  DIOVAN-HCT  Take 1 tablet by mouth daily.     ziprasidone 40 MG capsule  Commonly known as:  GEODON  Take 40 mg by mouth every evening. At 5pm.  Take with 2 geodon 80mg      ziprasidone 80 MG capsule  Commonly known as:  GEODON  Take 160 mg by mouth every evening. At 5pm.  Take with geodon 40mg          Signed: Consuella Lose, C 11/06/2013, 2:01 PM

## 2013-11-06 NOTE — H&P (Signed)
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 male with history of DM, CKD, schizoaffective disorder. He was admitted to [facility] from his group home on 10/26/13 with three day h/o dizziness and headache. CT head with SAH. 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 Kathyrn Sheriff on 10/27/13. He developed pink tinged drainage from nose and was placed on bedrest due to CSF leak. Is on nimotop with TCD done to monitor for vasospasms. He continues to have fevers and was pan-cultured 11/05/13. Rehab team recommended CIR and patient admitted today.    Review of Systems  HENT: Negative for hearing loss.   Eyes: Positive for blurred vision (since hospitalization.). Negative for double vision.  Respiratory: Negative for cough and shortness of breath.   Cardiovascular: Negative for chest pain and palpitations.  Gastrointestinal: Negative for nausea, vomiting, abdominal pain and constipation.  Genitourinary: Negative for dysuria.  Musculoskeletal: Positive for back pain (chronic) and myalgias.  Neurological: Positive for tingling (BLE), focal weakness and headaches.  Psychiatric/Behavioral: Negative for depression. The patient is not nervous/anxious.    Past Medical History  Diagnosis Date  . Diabetes mellitus without complication   . Mental disorder     schizoaffective  . Hypertension   . Chronic kidney disease     renal insufficiency   Past Surgical History  Procedure Laterality Date  . Ankle fracture surgery    . Knee surgery      due to fracture.   . Craniotomy Left 10/27/2013    Procedure: Craniotomy for Aneurysm Clipping;  Surgeon: Consuella Lose, MD;  Location: [facility];  Service: Neurosurgery;  Laterality: Left;   History reviewed. No pertinent family  history.  Social History: Lives in a group home. Keeps up his room and does chores. Does not use AD. He reports that he has been smoking--1PPD. He does not have any smokeless tobacco history on file. He does not use alcohol or illicit drugs.    Allergies: No Known Allergies   Medications Prior to Admission  Medication Sig Dispense Refill  . acetaminophen (TYLENOL) 325 MG tablet Take 650 mg by mouth every 6 (six) hours as needed for mild pain or headache.      . calcium-vitamin D (OSCAL WITH D) 500-200 MG-UNIT per tablet Take 1 tablet by mouth 2 (two) times daily.      . fenofibrate (TRICOR) 145 MG tablet Take 145 mg by mouth daily.      . insulin lispro protamine-lispro (HUMALOG 75/25 MIX) (75-25) 100 UNIT/ML SUSP injection Inject 35-50 Units into the skin 2 (two) times daily with a meal. Use 50 units in the morning and 35 units in the evening at 5pm      . lamoTRIgine (LAMICTAL) 100 MG tablet Take 100 mg by mouth 2 (two) times daily.      . metoprolol succinate (TOPROL-XL) 100 MG 24 hr tablet Take 100 mg by mouth daily. Take with or immediately following a meal.      . omeprazole (PRILOSEC) 20 MG capsule Take 20 mg by mouth daily.      . traMADol (ULTRAM) 50 MG tablet Take 50 mg by mouth 2 (two) times daily as needed for moderate pain.      . valsartan-hydrochlorothiazide (DIOVAN-HCT) 320-25 MG per tablet Take 1 tablet  by mouth daily.      . ziprasidone (GEODON) 40 MG capsule Take 40 mg by mouth every evening. At 5pm.  Take with 2 geodon 8m      . ziprasidone (GEODON) 80 MG capsule Take 160 mg by mouth every evening. At 5pm.  Take with geodon 448m       Home: Home Living Family/patient expects to be discharged to:: Group home Type of Home: Group Home Additional Comments: spoke with [facility] who reports they can provide minimal physical assist to patient and they do have a w/c that pt can use until pt safe to ambulate. Staff present to assist with ADLs, meal prep  and medical management.   Functional History: Prior Function Comments: staff provided meals and medications  Functional Status:  Mobility: min assist for ambulation and transfers     Ambulation/Gait Ambulation Distance (Feet): 40 Feet Gait velocity: slow and guarded to prevent falling General Gait Details: pt self limited his amb distance. pt unwilling to take larger steps.     ADL: ADL Grooming: Wash/dry hands;Wash/dry face (mod vc for task completion) Where Assessed - Grooming: Supported standing Upper Body Bathing: Chest;Right arm;Left arm;Abdomen;Supervision/safety;Set up Where Assessed - Upper Body Bathing: Unsupported sitting Lower Body Bathing: Minimal assistance Where Assessed - Lower Body Bathing: Supported sit to stand Upper Body Dressing: Minimal assistance Where Assessed - Upper Body Dressing: Unsupported sitting Lower Body Dressing: Supervision/safety (Doffing socks ) Where Assessed - Lower Body Dressing: Unsupported sitting Toilet Transfer: Minimal assistance Toilet Transfer Method: Sit to stand Toilet Transfer Equipment: Raised toilet seat with arms (or 3-in-1 over toilet) Transfers/Ambulation Related to ADLs: Pt transfered in/out of bed with min A. Ambulated to sink with min A +2 HHA ADL Comments: DL Comments- pt needed verbal/physical cues to remain standing up right and to complete activities safely.Pt was able to perform part of a bathing task standing at the sink with mod A. Due to poor attention and activity tolerance pt was unable to complete task at sink level. Once seated pt completed self care activities.   Cognition: Cognition Overall Cognitive Status: Impaired/Different from baseline Arousal/Alertness: Awake/alert Orientation Level: Oriented X4 Attention: Sustained Sustained Attention: Appears intact Memory:  (TBA) Awareness: Appears intact Problem Solving: Appears intact Safety/Judgment:  (TBA) Cognition Arousal/Alertness:  Awake/alert Behavior During Therapy: Agitated (at imes) Overall Cognitive Status: Impaired/Different from baseline Area of Impairment: Safety/judgement;Awareness;Attention Current Attention Level: Selective Memory:  (appears at baseline) Safety/Judgement: Decreased awareness of safety;Decreased awareness of deficits Awareness: Emergent General Comments: Feel pt has further impairments than his baseline functional capacity after pariticipating in ADL with pt.   Physical Exam: Blood pressure 154/76, pulse 57, temperature 98.5 F (36.9 C), temperature source Oral, resp. rate 15, height _0  (1.88 m), weight 122.1 kg (269 lb 2.9 oz), SpO2 96.00%. Physical Exam  Nursing note and vitals reviewed.  Constitutional: He is oriented to person, place, and time. He appears well-developed and well-nourished.  Polite and appropriate.  HENT:  Head: Normocephalic.  Left crani incision with staples intact. Left periorbital edema and bruising all resolving. Eyes: Conjunctivae are normal. Pupils are equal, round, and reactive to light.  Neck: Normal range of motion.  Cardiovascular: Normal rate and regular rhythm.  No murmur heard.  Respiratory: Effort normal and breath sounds normal. No respiratory distress. No wheezes GI: Soft. Bowel sounds are normal. He exhibits no distension. There is no tenderness.  Musculoskeletal: He exhibits no edema.  Well healed old scars on left knee and  left ankle.  Neurological: He is alert and oriented to person, place, and time.  Oral and LUE Dyskinesias improving. Movement much more fluid. Tracks to all visual fields. Speech clearer. Follows all simple commands. Has intact basic insight and awareness.  Oriented to day, date, place. Strength RUE 4/5 bic,tri,delt, hand/wrist. LUE 4+ prox to distal. RLE 3/5 HF, 3+ KE and ankle. LLE is 3+ HF, 4/5 knee, 4/5 ankle. No gross sensory deficits. Skin: Skin is warm and dry.  Psychiatric:  Calm,  Pleasant.    Results for orders  placed during the hospital encounter of 10/26/13 (from the past 48 hour(s))  CBC     Status: Abnormal   Collection Time    11/05/13 10:30 AM      Result Value Ref Range   WBC 9.9  4.0 - 10.5 K/uL   RBC 4.02 (*) 4.22 - 5.81 MIL/uL   Hemoglobin 12.9 (*) 13.0 - 17.0 g/dL   HCT 35.5 (*) 39.0 - 52.0 %   MCV 88.3  78.0 - 100.0 fL   MCH 32.1  26.0 - 34.0 pg   MCHC 36.3 (*) 30.0 - 36.0 g/dL   RDW 14.4  11.5 - 15.5 %   Platelets 431 (*) 150 - 400 K/uL  BASIC METABOLIC PANEL     Status: Abnormal   Collection Time    11/05/13 10:30 AM      Result Value Ref Range   Sodium 137  137 - 147 mEq/L   Potassium 4.1  3.7 - 5.3 mEq/L   Chloride 100  96 - 112 mEq/L   CO2 21  19 - 32 mEq/L   Glucose, Bld 218 (*) 70 - 99 mg/dL   BUN 13  6 - 23 mg/dL   Creatinine, Ser 1.17  0.50 - 1.35 mg/dL   Calcium 9.1  8.4 - 10.5 mg/dL   GFR calc non Af Amer 65 (*) >90 mL/min   GFR calc Af Amer 75 (*) >90 mL/min   Comment: (NOTE)     The eGFR has been calculated using the CKD EPI equation.     This calculation has not been validated in all clinical situations.     eGFR's persistently <90 mL/min signify possible Chronic Kidney     Disease.  GLUCOSE, CAPILLARY     Status: Abnormal   Collection Time    11/05/13 11:27 AM      Result Value Ref Range   Glucose-Capillary 164 (*) 70 - 99 mg/dL   Comment 1 Notify RN     Comment 2 Documented in Chart    GLUCOSE, CAPILLARY     Status: Abnormal   Collection Time    11/05/13  5:06 PM      Result Value Ref Range   Glucose-Capillary 164 (*) 70 - 99 mg/dL   Comment 1 Notify RN     Comment 2 Documented in Chart    GLUCOSE, CAPILLARY     Status: Abnormal   Collection Time    11/05/13  5:12 PM      Result Value Ref Range   Glucose-Capillary 173 (*) 70 - 99 mg/dL  GLUCOSE, CAPILLARY     Status: Abnormal   Collection Time    11/05/13 10:29 PM      Result Value Ref Range   Glucose-Capillary 140 (*) 70 - 99 mg/dL  GLUCOSE, CAPILLARY     Status: Abnormal   Collection  Time    11/06/13  8:40 AM      Result Value   Ref Range   Glucose-Capillary 151 (*) 70 - 99 mg/dL   Dg Chest Port 1 View  11/05/2013   CLINICAL DATA:  Fever  EXAM: PORTABLE CHEST - 1 VIEW  COMPARISON:  DG CHEST 1V PORT dated 10/27/2013  FINDINGS: The lungs are reasonably well inflated. There is no alveolar infiltrate nor evidence of atelectasis. The interstitial markings are mildly prominent diffusely but less conspicuous than on the previous study. The cardiopericardial silhouette is enlarged. The right internal jugular venous catheter appears unchanged. No significant pleural effusion is demonstrated.  IMPRESSION: 1. There is no evidence of pneumonia nor pleural effusion. 2. The cardiopericardial silhouette remains enlarged but its margins are better defined today. The interstitial markings are prominent bilaterally but have improved since the previous study.   Electronically Signed   By: David  Martinique   On: 11/05/2013 10:14    Post Admission Physician Evaluation: 1. 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 2. Patient is admitted to receive collaborative, interdisciplinary care between the physiatrist, rehab nursing staff, and therapy team. 3. Patient's level of medical complexity and substantial therapy needs in context of that medical necessity cannot be provided at a lesser intensity of care such as a SNF. 4. Patient has experienced substantial functional loss from his/her baseline which was documented above under the "Functional History" and "Functional Status" headings.  Judging by the patient's diagnosis, physical exam, and functional history, the patient has potential for functional progress which will result in measurable gains while on inpatient rehab.  These gains will be of substantial and practical use upon discharge  in facilitating mobility and self-care at the household level. 5. Physiatrist will provide 24 hour management of medical needs as well as oversight of  the therapy plan/treatment and provide guidance as appropriate regarding the interaction of the two. 6. 24 hour rehab nursing will assist with bladder management, bowel management, safety, skin/wound care, disease management, medication administration, pain management and patient education  and help integrate therapy concepts, techniques,education, etc. 7. PT will assess and treat for/with: Lower extremity strength, range of motion, stamina, balance, functional mobility, safety, adaptive techniques and equipment, NMR, education, visual spatial awareness.   Goals are: mod I to intermittent supervision. 8. OT will assess and treat for/with: ADL's, functional mobility, safety, upper extremity strength, adaptive techniques and equipment, NMR, visual and cognitive awareness, education.   Goals are: mod I to supervision. 9. SLP will assess and treat for/with: cognition, communication.  Goals are: mod I to supervision (may be close to baseline). 10. Case Management and Social Worker will assess and treat for psychological issues and discharge planning. 11. Team conference will be held weekly to assess progress toward goals and to determine barriers to discharge. 12. Patient will receive at least 3 hours of therapy per day at least 5 days per week. 13. ELOS: 8-12 days       14. Prognosis:  excellent   Medical Problem List and Plan: SAH d/t A-com aneurysm  1. DVT Prophylaxis/Anticoagulation: Pharmaceutical: Lovenox 2. Pain Management: continue hydrocodone prn for HA 3. Mood: No signs of distress or agitation. LCSW to follow for evaluation.  4. Neuropsych: This patient is not capable of making decisions on his own behalf. 5. FUO: will continue to monitor. ?CNS related. Await culture results. Reactive leucocytosis resolving.  6. Schizoaffective disorder: Back on geodon but not on home dose yet. Off Lamictal at this time--question resume. Reports of agitation at nights.--follow sleep pattern  7. DM type 2:  Used 70/30 insulin at home--50 u/am and 35 u/pm. On SSI at this time. Will resume low dose 70/30 insulin as po intake good.  8. CKD: Renal status normal at this time. Will monitor for now--off Diovan/HCTZ.  9. HTN: Monitor with bid checks. Resume metoprolol at lower dose and titrate up as indicated--monitor HR.  Off Diovan/HCTZ at this time.   Meredith Staggers, MD, [facility]   11/06/2013

## 2013-11-06 NOTE — Progress Notes (Signed)
Pt seen and examined. No current c/o. No issues overnight.  EXAM: Temp:  [98.5 F (36.9 C)-99.8 F (37.7 C)] 98.5 F (36.9 C) (03/13 0841) Pulse Rate:  [50-78] 57 (03/13 0600) Resp:  [13-17] 15 (03/13 0600) BP: (133-177)/(65-97) 154/76 mmHg (03/13 0600) SpO2:  [95 %-98 %] 96 % (03/13 0600) Intake/Output     03/12 0701 - 03/13 0700 03/13 0701 - 03/14 0700   P.O.     I.V. (mL/kg) 3000 (24.6) 500 (4.1)   Total Intake(mL/kg) 3000 (24.6) 500 (4.1)   Urine (mL/kg/hr) 2510 (0.9)    Total Output 2510     Net +490 +500        Urine Occurrence 2 x     Awake, alert, oriented Speech fluent CN intact No drift, good strength throughout Wound c/d/i  LABS: Lab Results  Component Value Date   CREATININE 1.17 11/05/2013   BUN 13 11/05/2013   NA 137 11/05/2013   K 4.1 11/05/2013   CL 100 11/05/2013   CO2 21 11/05/2013   Lab Results  Component Value Date   WBC 9.9 11/05/2013   HGB 12.9* 11/05/2013   HCT 35.5* 11/05/2013   MCV 88.3 11/05/2013   PLT 431* 11/05/2013    IMAGING: No new imaging  IMPRESSION: - 63 y.o. male SAH d#12, POD# 9 s/p clipping Acom aneurysm, neurologically at baseline - Chronic renal insufficiency likely sec to HTN/DM - Urine cx (+) for E coli, however likely asymptomatic bacturia given clean UA.  PLAN: - Pt remains neurologically stable, ready for transfer to CIR

## 2013-11-06 NOTE — Clinical Social Work Note (Signed)
Clinical Social Worker continuing to follow patient and family for support and discharge planning needs.  CSW spoke with inpatient admissions coordinator Pamala Hurry) who states that patient is ready for inpatient rehab admission today.  Inpatient rehab admissions coordinator has already communicated directly with patient brother Jenny Reichmann) and he will be here at 13:00.  Pamala Hurry has also made arrangements with [name] at [facility] about patient potential return once appropriate.  CSW appreciative of inpatient rehab involvement and support.   Clinical Social Worker will sign off for now as social work intervention is no longer needed. Please consult [place] again if new need arises.  Barbette Or, [place]

## 2013-11-06 NOTE — Progress Notes (Addendum)
Met with pt at bedside and RN. We have an inpt rehab bed available today and can admit pt. Charge RN contacted Dr. Kathyrn Sheriff to clarify if pt can be discharged to inpt rehab today and he is in agreement. I will contact pt's brother as well as [facility] to make them aware. RN CM and SW are aware. [phone]

## 2013-11-07 ENCOUNTER — Inpatient Hospital Stay (HOSPITAL_COMMUNITY): Payer: Medicare Other

## 2013-11-07 ENCOUNTER — Inpatient Hospital Stay (HOSPITAL_COMMUNITY): Payer: Self-pay | Admitting: Speech Pathology

## 2013-11-07 ENCOUNTER — Inpatient Hospital Stay (HOSPITAL_COMMUNITY): Payer: Medicare Other | Admitting: Occupational Therapy

## 2013-11-07 DIAGNOSIS — I609 Nontraumatic subarachnoid hemorrhage, unspecified: Secondary | ICD-10-CM

## 2013-11-07 LAB — GLUCOSE, CAPILLARY
GLUCOSE-CAPILLARY: 137 mg/dL — AB (ref 70–99)
GLUCOSE-CAPILLARY: 139 mg/dL — AB (ref 70–99)
Glucose-Capillary: 147 mg/dL — ABNORMAL HIGH (ref 70–99)
Glucose-Capillary: 143 mg/dL — ABNORMAL HIGH (ref 70–99)

## 2013-11-07 LAB — URINE CULTURE
Colony Count: >=100000
Special Requests: NORMAL

## 2013-11-07 NOTE — H&P (View-Only) (Signed)
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 male with history of DM, CKD, schizoaffective disorder. He was admitted to [facility] from his group home on 10/26/13 with three day h/o dizziness and headache. CT head with SAH. 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 Kathyrn Sheriff on 10/27/13. He developed pink tinged drainage from nose and was placed on bedrest due to CSF leak. Is on nimotop with TCD done to monitor for vasospasms. He continues to have fevers and was pan-cultured 11/05/13. Rehab team recommended CIR and patient admitted today.    Review of Systems  HENT: Negative for hearing loss.   Eyes: Positive for blurred vision (since hospitalization.). Negative for double vision.  Respiratory: Negative for cough and shortness of breath.   Cardiovascular: Negative for chest pain and palpitations.  Gastrointestinal: Negative for nausea, vomiting, abdominal pain and constipation.  Genitourinary: Negative for dysuria.  Musculoskeletal: Positive for back pain (chronic) and myalgias.  Neurological: Positive for tingling (BLE), focal weakness and headaches.  Psychiatric/Behavioral: Negative for depression. The patient is not nervous/anxious.    Past Medical History  Diagnosis Date  . Diabetes mellitus without complication   . Mental disorder     schizoaffective  . Hypertension   . Chronic kidney disease     renal insufficiency   Past Surgical History  Procedure Laterality Date  . Ankle fracture surgery    . Knee surgery      due to fracture.   . Craniotomy Left 10/27/2013    Procedure: Craniotomy for Aneurysm Clipping;  Surgeon: Consuella Lose, MD;  Location: [facility];  Service: Neurosurgery;  Laterality: Left;   History reviewed. No pertinent family  history.  Social History: Lives in a group home. Keeps up his room and does chores. Does not use AD. He reports that he has been smoking--1PPD. He does not have any smokeless tobacco history on file. He does not use alcohol or illicit drugs.    Allergies: No Known Allergies   Medications Prior to Admission  Medication Sig Dispense Refill  . acetaminophen (TYLENOL) 325 MG tablet Take 650 mg by mouth every 6 (six) hours as needed for mild pain or headache.      . calcium-vitamin D (OSCAL WITH D) 500-200 MG-UNIT per tablet Take 1 tablet by mouth 2 (two) times daily.      . fenofibrate (TRICOR) 145 MG tablet Take 145 mg by mouth daily.      . insulin lispro protamine-lispro (HUMALOG 75/25 MIX) (75-25) 100 UNIT/ML SUSP injection Inject 35-50 Units into the skin 2 (two) times daily with a meal. Use 50 units in the morning and 35 units in the evening at 5pm      . lamoTRIgine (LAMICTAL) 100 MG tablet Take 100 mg by mouth 2 (two) times daily.      . metoprolol succinate (TOPROL-XL) 100 MG 24 hr tablet Take 100 mg by mouth daily. Take with or immediately following a meal.      . omeprazole (PRILOSEC) 20 MG capsule Take 20 mg by mouth daily.      . traMADol (ULTRAM) 50 MG tablet Take 50 mg by mouth 2 (two) times daily as needed for moderate pain.      . valsartan-hydrochlorothiazide (DIOVAN-HCT) 320-25 MG per tablet Take 1 tablet  by mouth daily.      . ziprasidone (GEODON) 40 MG capsule Take 40 mg by mouth every evening. At 5pm.  Take with 2 geodon 8m      . ziprasidone (GEODON) 80 MG capsule Take 160 mg by mouth every evening. At 5pm.  Take with geodon 448m       Home: Home Living Family/patient expects to be discharged to:: Group home Type of Home: Group Home Additional Comments: spoke with [facility] who reports they can provide minimal physical assist to patient and they do have a w/c that pt can use until pt safe to ambulate. Staff present to assist with ADLs, meal prep  and medical management.   Functional History: Prior Function Comments: staff provided meals and medications  Functional Status:  Mobility: min assist for ambulation and transfers     Ambulation/Gait Ambulation Distance (Feet): 40 Feet Gait velocity: slow and guarded to prevent falling General Gait Details: pt self limited his amb distance. pt unwilling to take larger steps.     ADL: ADL Grooming: Wash/dry hands;Wash/dry face (mod vc for task completion) Where Assessed - Grooming: Supported standing Upper Body Bathing: Chest;Right arm;Left arm;Abdomen;Supervision/safety;Set up Where Assessed - Upper Body Bathing: Unsupported sitting Lower Body Bathing: Minimal assistance Where Assessed - Lower Body Bathing: Supported sit to stand Upper Body Dressing: Minimal assistance Where Assessed - Upper Body Dressing: Unsupported sitting Lower Body Dressing: Supervision/safety (Doffing socks ) Where Assessed - Lower Body Dressing: Unsupported sitting Toilet Transfer: Minimal assistance Toilet Transfer Method: Sit to stand Toilet Transfer Equipment: Raised toilet seat with arms (or 3-in-1 over toilet) Transfers/Ambulation Related to ADLs: Pt transfered in/out of bed with min A. Ambulated to sink with min A +2 HHA ADL Comments: DL Comments- pt needed verbal/physical cues to remain standing up right and to complete activities safely.Pt was able to perform part of a bathing task standing at the sink with mod A. Due to poor attention and activity tolerance pt was unable to complete task at sink level. Once seated pt completed self care activities.   Cognition: Cognition Overall Cognitive Status: Impaired/Different from baseline Arousal/Alertness: Awake/alert Orientation Level: Oriented X4 Attention: Sustained Sustained Attention: Appears intact Memory:  (TBA) Awareness: Appears intact Problem Solving: Appears intact Safety/Judgment:  (TBA) Cognition Arousal/Alertness:  Awake/alert Behavior During Therapy: Agitated (at imes) Overall Cognitive Status: Impaired/Different from baseline Area of Impairment: Safety/judgement;Awareness;Attention Current Attention Level: Selective Memory:  (appears at baseline) Safety/Judgement: Decreased awareness of safety;Decreased awareness of deficits Awareness: Emergent General Comments: Feel pt has further impairments than his baseline functional capacity after pariticipating in ADL with pt.   Physical Exam: Blood pressure 154/76, pulse 57, temperature 98.5 F (36.9 C), temperature source Oral, resp. rate 15, height _0  (1.88 m), weight 122.1 kg (269 lb 2.9 oz), SpO2 96.00%. Physical Exam  Nursing note and vitals reviewed.  Constitutional: He is oriented to person, place, and time. He appears well-developed and well-nourished.  Polite and appropriate.  HENT:  Head: Normocephalic.  Left crani incision with staples intact. Left periorbital edema and bruising all resolving. Eyes: Conjunctivae are normal. Pupils are equal, round, and reactive to light.  Neck: Normal range of motion.  Cardiovascular: Normal rate and regular rhythm.  No murmur heard.  Respiratory: Effort normal and breath sounds normal. No respiratory distress. No wheezes GI: Soft. Bowel sounds are normal. He exhibits no distension. There is no tenderness.  Musculoskeletal: He exhibits no edema.  Well healed old scars on left knee and  left ankle.  Neurological: He is alert and oriented to person, place, and time.  Oral and LUE Dyskinesias improving. Movement much more fluid. Tracks to all visual fields. Speech clearer. Follows all simple commands. Has intact basic insight and awareness.  Oriented to day, date, place. Strength RUE 4/5 bic,tri,delt, hand/wrist. LUE 4+ prox to distal. RLE 3/5 HF, 3+ KE and ankle. LLE is 3+ HF, 4/5 knee, 4/5 ankle. No gross sensory deficits. Skin: Skin is warm and dry.  Psychiatric:  Calm,  Pleasant.    Results for orders  placed during the hospital encounter of 10/26/13 (from the past 48 hour(s))  CBC     Status: Abnormal   Collection Time    11/05/13 10:30 AM      Result Value Ref Range   WBC 9.9  4.0 - 10.5 K/uL   RBC 4.02 (*) 4.22 - 5.81 MIL/uL   Hemoglobin 12.9 (*) 13.0 - 17.0 g/dL   HCT 35.5 (*) 39.0 - 52.0 %   MCV 88.3  78.0 - 100.0 fL   MCH 32.1  26.0 - 34.0 pg   MCHC 36.3 (*) 30.0 - 36.0 g/dL   RDW 14.4  11.5 - 15.5 %   Platelets 431 (*) 150 - 400 K/uL  BASIC METABOLIC PANEL     Status: Abnormal   Collection Time    11/05/13 10:30 AM      Result Value Ref Range   Sodium 137  137 - 147 mEq/L   Potassium 4.1  3.7 - 5.3 mEq/L   Chloride 100  96 - 112 mEq/L   CO2 21  19 - 32 mEq/L   Glucose, Bld 218 (*) 70 - 99 mg/dL   BUN 13  6 - 23 mg/dL   Creatinine, Ser 1.17  0.50 - 1.35 mg/dL   Calcium 9.1  8.4 - 10.5 mg/dL   GFR calc non Af Amer 65 (*) >90 mL/min   GFR calc Af Amer 75 (*) >90 mL/min   Comment: (NOTE)     The eGFR has been calculated using the CKD EPI equation.     This calculation has not been validated in all clinical situations.     eGFR's persistently <90 mL/min signify possible Chronic Kidney     Disease.  GLUCOSE, CAPILLARY     Status: Abnormal   Collection Time    11/05/13 11:27 AM      Result Value Ref Range   Glucose-Capillary 164 (*) 70 - 99 mg/dL   Comment 1 Notify RN     Comment 2 Documented in Chart    GLUCOSE, CAPILLARY     Status: Abnormal   Collection Time    11/05/13  5:06 PM      Result Value Ref Range   Glucose-Capillary 164 (*) 70 - 99 mg/dL   Comment 1 Notify RN     Comment 2 Documented in Chart    GLUCOSE, CAPILLARY     Status: Abnormal   Collection Time    11/05/13  5:12 PM      Result Value Ref Range   Glucose-Capillary 173 (*) 70 - 99 mg/dL  GLUCOSE, CAPILLARY     Status: Abnormal   Collection Time    11/05/13 10:29 PM      Result Value Ref Range   Glucose-Capillary 140 (*) 70 - 99 mg/dL  GLUCOSE, CAPILLARY     Status: Abnormal   Collection  Time    11/06/13  8:40 AM      Result Value  Ref Range   Glucose-Capillary 151 (*) 70 - 99 mg/dL   Dg Chest Port 1 View  11/05/2013   CLINICAL DATA:  Fever  EXAM: PORTABLE CHEST - 1 VIEW  COMPARISON:  DG CHEST 1V PORT dated 10/27/2013  FINDINGS: The lungs are reasonably well inflated. There is no alveolar infiltrate nor evidence of atelectasis. The interstitial markings are mildly prominent diffusely but less conspicuous than on the previous study. The cardiopericardial silhouette is enlarged. The right internal jugular venous catheter appears unchanged. No significant pleural effusion is demonstrated.  IMPRESSION: 1. There is no evidence of pneumonia nor pleural effusion. 2. The cardiopericardial silhouette remains enlarged but its margins are better defined today. The interstitial markings are prominent bilaterally but have improved since the previous study.   Electronically Signed   By: David  Martinique   On: 11/05/2013 10:14    Post Admission Physician Evaluation: 1. 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 2. Patient is admitted to receive collaborative, interdisciplinary care between the physiatrist, rehab nursing staff, and therapy team. 3. Patient's level of medical complexity and substantial therapy needs in context of that medical necessity cannot be provided at a lesser intensity of care such as a SNF. 4. Patient has experienced substantial functional loss from his/her baseline which was documented above under the "Functional History" and "Functional Status" headings.  Judging by the patient's diagnosis, physical exam, and functional history, the patient has potential for functional progress which will result in measurable gains while on inpatient rehab.  These gains will be of substantial and practical use upon discharge  in facilitating mobility and self-care at the household level. 5. Physiatrist will provide 24 hour management of medical needs as well as oversight of  the therapy plan/treatment and provide guidance as appropriate regarding the interaction of the two. 6. 24 hour rehab nursing will assist with bladder management, bowel management, safety, skin/wound care, disease management, medication administration, pain management and patient education  and help integrate therapy concepts, techniques,education, etc. 7. PT will assess and treat for/with: Lower extremity strength, range of motion, stamina, balance, functional mobility, safety, adaptive techniques and equipment, NMR, education, visual spatial awareness.   Goals are: mod I to intermittent supervision. 8. OT will assess and treat for/with: ADL's, functional mobility, safety, upper extremity strength, adaptive techniques and equipment, NMR, visual and cognitive awareness, education.   Goals are: mod I to supervision. 9. SLP will assess and treat for/with: cognition, communication.  Goals are: mod I to supervision (may be close to baseline). 10. Case Management and Social Worker will assess and treat for psychological issues and discharge planning. 11. Team conference will be held weekly to assess progress toward goals and to determine barriers to discharge. 12. Patient will receive at least 3 hours of therapy per day at least 5 days per week. 13. ELOS: 8-12 days       14. Prognosis:  excellent   Medical Problem List and Plan: SAH d/t A-com aneurysm  1. DVT Prophylaxis/Anticoagulation: Pharmaceutical: Lovenox 2. Pain Management: continue hydrocodone prn for HA 3. Mood: No signs of distress or agitation. LCSW to follow for evaluation.  4. Neuropsych: This patient is not capable of making decisions on his own behalf. 5. FUO: will continue to monitor. ?CNS related. Await culture results. Reactive leucocytosis resolving.  6. Schizoaffective disorder: Back on geodon but not on home dose yet. Off Lamictal at this time--question resume. Reports of agitation at nights.--follow sleep pattern  7. DM type 2:  Used 70/30 insulin at home--50 u/am and 35 u/pm. On SSI at this time. Will resume low dose 70/30 insulin as po intake good.  8. CKD: Renal status normal at this time. Will monitor for now--off Diovan/HCTZ.  9. HTN: Monitor with bid checks. Resume metoprolol at lower dose and titrate up as indicated--monitor HR.  Off Diovan/HCTZ at this time.   Meredith Staggers, MD, [facility]   11/06/2013

## 2013-11-07 NOTE — Evaluation (Signed)
Physical Therapy Assessment and Plan  Patient Details  Name: Riley Stuart MRN: [id number] Date of Birth: [birth year]  PT Diagnosis: Abnormality of gait, Cognitive deficits and Muscle weakness Rehab Potential: Good (to PLOF. ) ELOS: 14 days (2 weeks)   Today's Date: 11/07/2013 Time: 1105-1205 Time Calculation (min): 60 min  Problem List:  Patient Active Problem List   Diagnosis Date Noted  . 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 SAH (subarachnoid hemorrhage) 10/26/2013    Past Medical History:  Past Medical History  Diagnosis Date  . Diabetes mellitus without complication   . Mental disorder     schizoaffective  . Hypertension   . Chronic kidney disease     renal insufficiency   Past Surgical History:  Past Surgical History  Procedure Laterality Date  . Ankle fracture surgery    . Knee surgery      due to fracture.   . Craniotomy Left 10/27/2013    Procedure: Craniotomy for Aneurysm Clipping;  Surgeon: Consuella Lose, MD;  Location: [facility];  Service: Neurosurgery;  Laterality: Left;    Assessment & Plan Riley Stuart is a 63 y.o. male with history of DM, CKD, schizoaffective disorder. He was admitted to [place] from his group home on 10/26/13 with three day h/o dizziness and headache. CT head with SAH. 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 Kathyrn Sheriff on 10/27/13. He developed pink tinged drainage from nose yesterday and was placed on bedrest. Placed on nimotop with TCD done to monitor for vasospasms. Pan cultured 11/05/13 for continued fevers.   Patient transferred to CIR on 11/06/2013 .   Patient currently requires min with mobility secondary to muscle weakness and motor apraxia, decreased coordination and decreased motor planning.  Prior to hospitalization, patient was independent  with mobility and lived  with  (Pt lives with members of group home and attendants. ) in a  home.  Home access is  Level entry.  Patient will benefit from skilled PT intervention to maximize safe functional mobility and decrease caregiver burden for planned discharge home with 24 hour supervision.  Anticipate patient will benefit from follow up [place] at discharge.  PT - End of Session Activity Tolerance: Tolerates 10 - 20 min activity with multiple rests Endurance Deficit: Yes Endurance Deficit Description: Pt required multiple rest breaks throughout session due to noted fatigue and reports of dizziness. BP slightly elevated 152/89.  PT Assessment Rehab Potential: Good (to PLOF. ) PT Patient demonstrates impairments in the following area(s): Balance;Endurance;Motor;Safety;Sensory PT Transfers Functional Problem(s): Bed Mobility;Bed to Chair;Car;Furniture PT Locomotion Functional Problem(s): Ambulation;Wheelchair Mobility PT Plan PT Intensity: Minimum of 1-2 x/day ,45 to 90 minutes PT Frequency: 5 out of 7 days PT Duration Estimated Length of Stay: 14 days (2 weeks) PT Treatment/Interventions: Ambulation/gait training;Balance/vestibular training;Cognitive remediation/compensation;Community reintegration;Discharge planning;Disease management/prevention;DME/adaptive equipment instruction;Functional electrical stimulation;Functional mobility training;Neuromuscular re-education;Pain management;Patient/family education;Psychosocial support;Splinting/orthotics;Stair training;Therapeutic Activities;Therapeutic Exercise;UE/LE Strength taining/ROM;UE/LE Coordination activities;Wheelchair propulsion/positioning PT Transfers Anticipated Outcome(s): Modified Independent PT Locomotion Anticipated Outcome(s): Supervision PT Recommendation Follow Up Recommendations: Home health PT;24 hour supervision/assistance Patient destination: Home (Return to Group Home setting) Equipment Recommended: Rolling walker with 5" wheels (TBD with pt  progression) Equipment Details: Pt may benefit from use of w/c if ambulation does not progress. Group home do have w/c for pt to access if needed.   Skilled Therapeutic Intervention Pt received sitting in recliner chair. Per ST pt  refused use of quick release belt. Pink Eye sign in place at patients door. Pt agreeable to answering questions and PT evaluation. Pt requires increased time to respond to questions, but also to answer correctly as per chart. Pt completed multiple sit to stand/stand to sit transfers in room with CGA/min A, ambulating from recliner chair around bed and sitting EOB. Pt completed sit to supine transfer with CGA and cues for technique. Pt reports mild dizziness with changes in position that quickly resolves once pt in still/not moving. Pt completed supine to sit transfer with min A and verbal cues for technique. Pt ambulated 5-10 feet to wheel chair without  Use of AD with min A. Pt also refused use of gait belt, reporting he does not like to feel trapped. Pt attempted wheel chair propulsion in hallway with increased encouragement, requiring min A to propel 65 feet using BUE. Pt pushed patient to gym. Attempted ambulation in gym with use of RW with pt demonstrating festinating gait pattern and pt perservating with the RLE. PT assisted with pushing the RW fwd and pt able to advance 10 feet before requesting a seated rest break. Pt again reported dizziness. BP: 152/89, HR: 72 BPM. Pt ambulated at 2nd trial of 10 feet with min A and cues to increased stride length, hip and knee flexion of the  RLE, with PT assisting advancement of the RW. Pt returned to room, seated in recliner chair and all needs in place. Pt reported he was very tired today.   PT Evaluation Precautions/Restrictions Precautions Precautions: Fall Precaution Comments: pt with known psychosis, declines use of a belt or lap tray for restraint. Restrictions Weight Bearing Restrictions: No General Chart Reviewed:  Yes Family/Caregiver Present: No Vital SignsTherapy Vitals Pulse Rate: 72 BP: 152/89 mmHg (Pt reported dizziness with transfers and ambulation. Changes of position.) Patient Position, if appropriate: Sitting Oxygen Therapy SpO2: 96 % O2 Device: None (Room air) Pain Pain Assessment Pain Assessment: 0-10 Pain Score: 1  Pain Type: Surgical pain Pain Location: Head Pain Descriptors / Indicators: Sore Pain Intervention(s): Emotional support Home Living/Prior Functioning Home Living Available Help at Discharge: Available 24 hours/day;Other (Comment) (Attendants at groups home will be able to provide min A with ADLs and transfers at discharge but prefer patient to be at supervision level. ) Type of Home: House Home Access: Level entry Home Layout: One level Additional Comments: OT on [facility]) spoke with [name] From [facility] who reports they can provide minimal physical assist to patient and they do have a w/c that pt can use until pt safe to ambulate. Staff present to assist with ADLs, meal prep and medical management.  Lives With: Other (Comment) (Pt lives with members of group home and attendants. ) Prior Function Level of Independence: Independent with basic ADLs;Independent with gait;Independent with transfers;Needs assistance with homemaking Homemaking Assistance Comments: Attendants assisted with meals and medications  Able to Take Stairs?: Yes Driving: No Vocation: On disability Leisure: Hobbies-yes (Comment) Comments: Pt enjoys playing cards, including spaids and solitaire Vision/Perception  Vision - History Baseline Vision: Wears glasses only for reading Patient Visual Report: No change from baseline Vision - Assessment Eye Alignment: Within Functional Limits Vision Assessment: Vision tested Ocular Range of Motion: Within Functional Limits Tracking/Visual Pursuits: Able to track stimulus in all quads without difficulty Visual Fields: No apparent  deficits Additional Comments: Pt able to read newspaper, small and large print without glassess donned.  Perception Perception: Within Functional Limits  Cognition Overall Cognitive Status: History  of cognitive impairments - at baseline Arousal/Alertness: Awake/alert Orientation Level: Oriented to person;Oriented to place;Oriented to time;Oriented to situation Attention: Sustained Sustained Attention: Appears intact Memory: Impaired Memory Impairment: Decreased recall of new information Awareness: Impaired Awareness Impairment: Emergent impairment Problem Solving: Impaired Problem Solving Impairment: Verbal basic Executive Function: Writer: Impaired Organizing Impairment: Functional basic Safety/Judgment: Impaired Comments: Pt required cueing to utilize call bell to request assistance and to not get up without help  Sensation Sensation Light Touch: Impaired by gross assessment (bilateral feet decreased to light touch from the ankles and distal. Per OT pt has poor foot hygiene. ) Proprioception: Impaired by gross assessment Coordination Gross Motor Movements are Fluid and Coordinated: Yes Fine Motor Movements are Fluid and Coordinated: No Motor  Motor Motor: Motor apraxia;Motor perseverations (RLE)  Mobility Bed Mobility Bed Mobility: Rolling Right;Supine to Sit;Sitting - Scoot to [employer] of Bed;Sit to Supine Rolling Right: 5: Set up Rolling Right Details: Verbal cues for sequencing;Verbal cues for technique;Verbal cues for precautions/safety Supine to Sit: 4: Min assist Supine to Sit Details: Tactile cues for sequencing;Verbal cues for technique;Verbal cues for precautions/safety;Verbal cues for sequencing Supine to Sit Details (indicate cue type and reason): Pt reports dizziness with change in position Sitting - Scoot to Edge of Bed: 5: Supervision Sitting - Scoot to Edge of Bed Details: Verbal cues for sequencing;Verbal cues for technique Sit to Supine: 4: Min  assist Sit to Supine - Details: Tactile cues for initiation;Tactile cues for sequencing;Verbal cues for sequencing;Verbal cues for technique;Verbal cues for precautions/safety Transfers Transfers: Yes Sit to Stand: 4: Min assist Sit to Stand Details: Tactile cues for initiation;Tactile cues for sequencing;Verbal cues for sequencing;Verbal cues for technique;Verbal cues for precautions/safety;Verbal cues for safe use of DME/AE Stand to Sit: 4: Min assist Stand to Sit Details (indicate cue type and reason): Tactile cues for sequencing;Tactile cues for weight shifting;Tactile cues for initiation;Verbal cues for sequencing;Verbal cues for technique;Verbal cues for precautions/safety Locomotion  Ambulation Ambulation: Yes Ambulation/Gait Assistance: 4: Min assist Ambulation Distance (Feet): 10 Feet (feet x2 with RW with max verbal cues. and 10 feet withou AD) Assistive device: Rolling walker Ambulation/Gait Assistance Details: Tactile cues for initiation;Tactile cues for sequencing;Tactile cues for weight shifting;Verbal cues for sequencing;Verbal cues for technique;Verbal cues for safe use of DME/AE Ambulation/Gait Assistance Details: Pt demonstrates decreased stride length of BLE, but of R>L and perseverates without tactile cueing to push the RW forward. Attempted ambulation in room without AD and pt able to demonstrate more fluid gait pattern. Pt would benefit  from use of device for increased stability.  Wheelchair Mobility Distance: 70  Trunk/Postural Assessment     Balance Balance Balance Assessed: Yes Static Sitting Balance Static Sitting - Balance Support: Feet unsupported Static Sitting - Level of Assistance: 6: Modified independent (Device/Increase time) Dynamic Sitting Balance Dynamic Sitting - Balance Support: No upper extremity supported;Feet unsupported;During functional activity Dynamic Sitting - Level of Assistance: 5: Stand by assistance Static Standing Balance Static  Standing - Balance Support: Bilateral upper extremity supported Static Standing - Level of Assistance: 5: Stand by assistance Dynamic Standing Balance Dynamic Standing - Balance Support: Bilateral upper extremity supported Dynamic Standing - Level of Assistance: 4: Min assist Extremity Assessment  RUE Assessment RUE Assessment: Within Functional Limits LUE Assessment LUE Assessment: Within Functional Limits RLE Assessment RLE Assessment: Exceptions to [facility] RLE AROM (degrees) Overall AROM Right Lower Extremity: Within functional limits for tasks assessed RLE Strength Right Hip Flexion: 3+/5 Right Hip ABduction: 3+/5 Right Knee Flexion: 3+/5 Right Knee  Extension: 3+/5 Right Ankle Dorsiflexion: 4/5 Right Ankle Plantar Flexion: 4/5 LLE Assessment LLE Assessment: Exceptions to WFL LLE AROM (degrees) Overall AROM Left Lower Extremity: Within functional limits for tasks assessed LLE Strength LLE Overall Strength: Within Functional Limits for tasks assessed LLE Overall Strength Comments: 4+/5  FIM:  FIM - Bed/Chair Transfer Bed/Chair Transfer Assistive Devices: Arm rests Bed/Chair Transfer: 5: Supine > Sit: Supervision (verbal cues/safety issues);4: Sit > Supine: Min A (steadying pt. > 75%/lift 1 leg);4: Bed > Chair or W/C: Min A (steadying Pt. > 75%);4: Chair or W/C > Bed: Min A (steadying Pt. > 75%) FIM - Locomotion: Wheelchair Distance: 70 Locomotion: Wheelchair: 2: Travels 50 - 149 ft with minimal assistance (Pt.>75%) FIM - Locomotion: Ambulation Locomotion: Ambulation Assistive Devices: Administrator Ambulation/Gait Assistance: 4: Min assist Locomotion: Ambulation: 1: Travels less than 50 ft with minimal assistance (Pt.>75%) FIM - Locomotion: Stairs Locomotion: Stairs: 0: Activity did not occur   Refer to Care Plan for Long Term Goals  Recommendations for other services: None  Discharge Criteria: Patient will be discharged from PT if patient refuses treatment 3  consecutive times without medical reason, if treatment goals not met, if there is a change in medical status, if patient makes no progress towards goals or if patient is discharged from hospital.  The above assessment, treatment plan, treatment alternatives and goals were discussed and mutually agreed upon: by patient  Lillia Corporal R 11/07/2013, 12:52 PM

## 2013-11-07 NOTE — Interval H&P Note (Signed)
Riley Stuart was admitted today to Inpatient Rehabilitation with the diagnosis of SAH.  The patient's history has been reviewed, patient examined, and there is no change in status.  Patient continues to be appropriate for intensive inpatient rehabilitation.  I have reviewed the patient's chart and labs.  Questions were answered to the patient's satisfaction.  This encounter and document were completed on 11/06/13. The H&P is now being placed in the rehab encounter for the purpose of charting.   Keshun Berrett T 11/07/2013, 1:57 AM

## 2013-11-07 NOTE — Progress Notes (Signed)
Riley Stuart is a 63 y.o. male with history of DM, CKD, schizoaffective disorder. He was admitted to [facility] from his group home on 10/26/13 with three day h/o dizziness and headache. CT head with SAH. 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 Kathyrn Sheriff on 10/27/13. He developed pink tinged drainage from nose and was placed on bedrest due to CSF leak. Is on nimotop with TCD done to monitor for vasospasms. He continues to have fevers and was pan-cultured 11/05/13. Rehab team recommended CIR and patient admitted today.   Subjective: Patient has no specific complaints today.  BP 159/91  Pulse 76  Temp(Src) 99.3 F (37.4 C) (Oral)  Resp 19  SpO2 94% No acute distress. Chest clear to auscultation. Cardiac exam S1-S2 are regular. Abdominal exam active bowel sounds, soft. Extremities no edema.   Post Admission Physician Evaluation:  1. 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  Medical Problem List and Plan:  SAH d/t A-com aneurysm  1. DVT Prophylaxis/Anticoagulation: Pharmaceutical: Lovenox  2. Pain Management: continue hydrocodone prn for HA  3. Mood: No signs of distress or agitation. LCSW to follow for evaluation.  4. Neuropsych: This patient is not capable of making decisions on his own behalf.  5. FUO: will continue to monitor. ?CNS related. Await culture results. Reactive leucocytosis resolving.  6. Schizoaffective disorder: Back on geodon but not on home dose yet. Off Lamictal at this time--question resume. Reports of agitation at nights.--follow sleep pattern  7. DM type 2: Used 70/30 insulin at home--50 u/am and 35 u/pm. On SSI at this time. Will resume low dose 70/30 insulin as po intake good.  CBG (last 3)   Recent Labs  11/06/13 1653 11/06/13 2048 11/07/13 0728  GLUCAP 179* 127* 137*   Monitor for  now  8. CKD: Renal status normal at this time. Will monitor for now--off Diovan/HCTZ.  9. HTN: Monitor with bid checks. Resume metoprolol at lower dose and titrate up as indicated--monitor HR. Off Diovan/HCTZ at this time.  BP Readings from Last 3 Encounters:  11/07/13 159/91  11/06/13 154/76  11/06/13 154/76   Will follow today but will likely have to increase medications.

## 2013-11-07 NOTE — Evaluation (Signed)
Speech Language Pathology Assessment and Plan  Patient Details  Name: Riley Stuart MRN: [id number] Date of Birth: 1951-01-18  SLP Diagnosis: Cognitive Impairments  Rehab Potential: Excellent ELOS: 8-12 days   Today's Date: 11/07/2013 Time: 1000-1055 Time Calculation (min): 55 min  Problem List:  Patient Active Problem List   Diagnosis Date Noted  . 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 SAH (subarachnoid hemorrhage) 10/26/2013   Past Medical History:  Past Medical History  Diagnosis Date  . Diabetes mellitus without complication   . Mental disorder     schizoaffective  . Hypertension   . Chronic kidney disease     renal insufficiency   Past Surgical History:  Past Surgical History  Procedure Laterality Date  . Ankle fracture surgery    . Knee surgery      due to fracture.   . Craniotomy Left 10/27/2013    Procedure: Craniotomy for Aneurysm Clipping;  Surgeon: Consuella Lose, MD;  Location: [facility];  Service: Neurosurgery;  Laterality: Left;    Assessment / Plan / Recommendation Clinical Impression Patient is a 63 y.o. male with history of DM, CKD, schizoaffective disorder. He was admitted to [place] from his group home on 10/26/13 with three day h/o dizziness and headache. CT head with SAH. CTA brain done revealing ~3 mm Acom aneurysm projecting sup and rightward-- likely source of hemorrhage and he underwent Left fronto-temporal craniotomy for clipping of Acom aneurysm on 10/27/13. He developed pink tinged drainage from nose and was placed on bedrest due to CSF leak.Marland Kitchen He continues to have fevers and was pan-cultured 11/05/13. Rehab team recommended CIR and patient admitted 11/07/13. Pt administered cognitive-linguistic evaluation and demonstrates cognitive impairments characterized by decreased problem solving, safety awareness, problem solving and working memory, however, unsure of patient's cognitive baseline at this time. Pt would benefit from skilled SLP  intervention to maximize cognitive function and overall functional independence prior to discharge.   Skilled Therapeutic Interventions          Administered cognitive-linguistic evaluation. Please see above for details.   SLP Assessment  Patient will need skilled Speech Lanaguage Pathology Services during CIR admission    Recommendations  Oral Care Recommendations: Oral care BID Recommendations for Other Services: Neuropsych consult Patient destination:  (Group Home where he was living PTA) Follow up Recommendations: None Equipment Recommended: None recommended by SLP    SLP Frequency 5 out of 7 days   SLP Treatment/Interventions Cognitive remediation/compensation;Cueing hierarchy;Functional tasks;Internal/external aids;Environmental controls;Patient/family education;Therapeutic Activities    Pain Pain Assessment Pain Assessment: No/denies pain Prior Functioning Type of Home: Group Home  Lives With: Other (Comment) (group home staff) Available Help at Discharge: Other (Comment) (1 staff member in Group home manages 6 residents per home)  Short Term Goals: Week 1: SLP Short Term Goal 1 (Week 1): Pt will utilize external memory aids to recall new, daily information with supervision verbal and question cues.  SLP Short Term Goal 2 (Week 1): Pt will utilize call bell to request assistance with supervision question cues.  SLP Short Term Goal 3 (Week 1): Pt will demonstrate functional problem solving for basic and familiar tasks with supervision verbal cues.   See FIM for current functional status Refer to Care Plan for Long Term Goals  Recommendations for other services: Neuropsych  Discharge Criteria: Patient will be discharged from SLP if patient refuses treatment 3 consecutive times without medical reason, if treatment goals not met, if there is a change in medical status, if patient makes no progress  towards goals or if patient is discharged from hospital.  The above assessment,  treatment plan, treatment alternatives and goals were discussed and mutually agreed upon: by patient  Dale Strausser 11/07/2013, 11:10 AM

## 2013-11-07 NOTE — Evaluation (Signed)
Occupational Therapy Assessment and Plan  Patient Details  Name: Riley Stuart MRN: [id number] Date of Birth: 10/20/1950  OT Diagnosis: cognitive deficits, muscle weakness (generalized), decreased balance and c/o dizziness. Rehab Potential: Rehab Potential: Good ELOS: 8-10 days   Today's Date: 11/07/2013 Time: 0900-1000 Time Calculation (min): 60 min Cancelled 45 min second session due to fatigue and reports too sleepy.  Problem List:  Patient Active Problem List   Diagnosis Date Noted  . 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 SAH (subarachnoid hemorrhage) 10/26/2013    Past Medical History:  Past Medical History  Diagnosis Date  . Diabetes mellitus without complication   . Mental disorder     schizoaffective  . Hypertension   . Chronic kidney disease     renal insufficiency   Past Surgical History:  Past Surgical History  Procedure Laterality Date  . Ankle fracture surgery    . Knee surgery      due to fracture.   . Craniotomy Left 10/27/2013    Procedure: Craniotomy for Aneurysm Clipping;  Surgeon: Consuella Lose, MD;  Location: [facility];  Service: Neurosurgery;  Laterality: Left;    Assessment & Plan Clinical Impression:  63 y.o. male with history of DM, CKD, schizoaffective disorder. He was admitted to [facility] from his group home on 10/26/13 with three day h/o dizziness and headache. CT head with SAH. 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 Kathyrn Sheriff on 10/27/13. He developed pink tinged drainage from nose yesterday and was placed on bedrest..  Patient transferred to CIR on 11/06/2013 .    Patient currently requires min-supervision with basic self-care skills secondary to muscle weakness, decreased problem solving, decreased safety awareness and decreased memory and decreased standing balance and decreased balance  strategies.  Prior to hospitalization, patient was independent with BADL tasks without AD.  Patient will benefit from skilled intervention to increase independence with basic self-care skills prior to discharge staff from [facility].  Anticipate patient will require 24 hour supervision and follow up home health.  OT - End of Session Activity Tolerance: Decreased this session Endurance Deficit: Yes Endurance Deficit Description: required several seated rest breaks OT Assessment Rehab Potential: Good OT Patient demonstrates impairments in the following area(s): Balance;Cognition;Endurance;Safety OT Basic ADL's Functional Problem(s): Grooming;Bathing;Dressing;Toileting OT Transfers Functional Problem(s): Toilet;Tub/Shower OT Plan OT Intensity: Minimum of 1-2 x/day, 45 to 90 minutes OT Frequency: 5 out of 7 days OT Duration/Estimated Length of Stay: 8-10 days OT Treatment/Interventions: Balance/vestibular training;DME/adaptive equipment instruction;Cognitive remediation/compensation;Discharge planning;Patient/family education;Self Care/advanced ADL retraining;Therapeutic Activities OT Basic Self-Care Anticipated Outcome(s): Supervision OT Toileting Anticipated Outcome(s): Mod I OT Bathroom Transfers Anticipated Outcome(s): Supervision OT Recommendation Patient destination: Home (Group Home) Follow Up Recommendations: Home health OT;24 hour supervision/assistance Equipment Recommended:  (TBA) Equipment Details: Patient has a tub shower combo that he shares with a roommate.  May recommend a tub bench if slow to progress.  Skilled Therapeutic Intervention 1)  OT eval and self care retraining to include sponge bath dress and groom.  Patient performed sit><stands and standing balance at sink with close supervision.  For grooming tasks, patient stood at sink to brush teeth and sat fot all other groom tasks. Patient required several seated rest breaks during 60 min session.  RN stated that patient  declined waist belt and lap tray on Taylor chair and stated concern for patient's ability to call before he gets  up.  Given patient's psych history might be best to limit physical restraints and only consider alarms, pink eye sign on door and frequent checks by RN/NT.  Patient not very talkative yet would answer questions when asked.  Difficult to determine what patient did to occupy his day PTA.  Will continue to assess.  2)  Patient sleeping in bed upon arrival with untouched lunch tray on table.  Patient declined to participate due to too sleepy.  RN aware.  OT Evaluation Precautions/Restrictions  Precautions Precautions: Fall Precaution Comments: pt with known psychosis, declines use of a belt or lap tray for restraint. Restrictions Weight Bearing Restrictions: No Pain Denies pain Home Living/Prior Functioning Home Living Available Help at Discharge: Other (Comment) (1 staff member in Group home manages 6 residents per home) Type of Home: Group Home Home Access: Level entry Home Layout: One level Additional Comments: OT on [facility]) spoke with [name] From [facility] who reports they can provide minimal physical assist to patient and they do have a w/c that pt can use until pt safe to ambulate. Staff present to assist with ADLs, meal prep and medical management.  Lives With: Other (Comment) (group home staff) Prior Function Comments: staff provided meals and medications ADL Overall supervision-min assist.  Refer to FIM below for details Vision/Perception  Vision - History Baseline Vision: Wears glasses all the time Perception Perception: Within Functional Limits  Cognition Overall Cognitive Status: History of cognitive impairments - at baseline Arousal/Alertness: Awake/alert Orientation Level: Oriented X4 Sustained Attention: Appears intact Memory: Impaired Memory Impairment: Decreased recall of new information Awareness: Impaired Awareness Impairment:  Emergent impairment Problem Solving: Impaired Problem Solving Impairment: Verbal basic Executive Function: Organizing Organizing: Impaired Organizing Impairment: Functional basic Safety/Judgment: Impaired Comments: Pt required cueing to utilize call bell to request assistance and to not get up without help  Sensation TBA  (patient declined to participate in second session due to fatigue and sleepy) Motor  Motor Motor: Motor apraxia;Motor perseverations (RLE) Mobility  Bed Mobility Bed Mobility: Rolling Right;Supine to Sit;Sitting - Scoot to [employer] of Bed;Sit to Supine Rolling Right: 5: Set up Rolling Right Details: Verbal cues for sequencing;Verbal cues for technique;Verbal cues for precautions/safety Supine to Sit: 4: Min assist Supine to Sit Details: Tactile cues for sequencing;Verbal cues for technique;Verbal cues for precautions/safety;Verbal cues for sequencing Supine to Sit Details (indicate cue type and reason): Pt reports dizziness with change in position Sitting - Scoot to Edge of Bed: 5: Supervision Sitting - Scoot to Edge of Bed Details: Verbal cues for sequencing;Verbal cues for technique Sit to Supine: 4: Min assist Sit to Supine - Details: Tactile cues for initiation;Tactile cues for sequencing;Verbal cues for sequencing;Verbal cues for technique;Verbal cues for precautions/safety Transfers Sit to Stand: 4: Min assist Sit to Stand Details: Tactile cues for initiation;Tactile cues for sequencing;Verbal cues for sequencing;Verbal cues for technique;Verbal cues for precautions/safety;Verbal cues for safe use of DME/AE Stand to Sit: 4: Min assist Stand to Sit Details (indicate cue type and reason): Tactile cues for sequencing;Tactile cues for weight shifting;Tactile cues for initiation;Verbal cues for sequencing;Verbal cues for technique;Verbal cues for precautions/safety  Trunk/Postural Assessment     Balance Balance Balance Assessed: Yes Static Sitting Balance Static  Sitting - Balance Support: Feet unsupported Static Sitting - Level of Assistance: 6: Modified independent (Device/Increase time) Dynamic Sitting Balance Dynamic Sitting - Balance Support: No upper extremity supported;Feet unsupported;During functional activity Dynamic Sitting - Level of Assistance: 5: Stand by assistance Static Standing Balance Static Standing -  Balance Support: Bilateral upper extremity supported Static Standing - Level of Assistance: 5: Stand by assistance Dynamic Standing Balance Dynamic Standing - Balance Support: Bilateral upper extremity supported Dynamic Standing - Level of Assistance: 4: Min assist Extremity/Trunk Assessment RUE Assessment RUE Assessment: Within Functional Limits LUE Assessment LUE Assessment: Within Functional Limits  FIM:  FIM - Grooming Grooming Steps: Wash, rinse, dry face;Wash, rinse, dry hands;Oral care, brush teeth, clean dentures;Brush, comb hair (seated except to brush teeth) Grooming: 5: Set-up assist to obtain items FIM - Bathing Bathing Steps Patient Completed: Chest;Right Arm;Left Arm;Abdomen;Front perineal area;Buttocks;Right upper leg;Left upper leg Bathing: 4: Min-Patient completes 8-9 20f10 parts or 75+ percent FIM - Upper Body Dressing/Undressing Upper body dressing/undressing steps patient completed: Thread/unthread right sleeve of pullover shirt/dresss;Thread/unthread left sleeve of pullover shirt/dress;Put head through opening of pull over shirt/dress;Pull shirt over trunk Upper body dressing/undressing: 5: Set-up assist to: Obtain clothing/put away FIM - Lower Body Dressing/Undressing Lower body dressing/undressing steps patient completed: Thread/unthread right underwear leg;Thread/unthread left underwear leg;Pull underwear up/down;Thread/unthread right pants leg;Thread/unthread left pants leg;Pull pants up/down;Don/Doff right sock;Don/Doff left sock;Don/Doff right shoe;Don/Doff left shoe (slide on shower shoes) Lower body  dressing/undressing: 5: Set-up assist to: Obtain clothing FIM - BControl and instrumentation engineerDevices: Arm rests Bed/Chair Transfer: 5: Supine > Sit: Supervision (verbal cues/safety issues);4: Sit > Supine: Min A (steadying pt. > 75%/lift 1 leg);4: Bed > Chair or W/C: Min A (steadying Pt. > 75%);4: Chair or W/C > Bed: Min A (steadying Pt. > 75%)   Refer to Care Plan for Long Term Goals  Recommendations for other services: None  Discharge Criteria: Patient will be discharged from OT if patient refuses treatment 3 consecutive times without medical reason, if treatment goals not met, if there is a change in medical status, if patient makes no progress towards goals or if patient is discharged from hospital.  The above assessment, treatment plan, treatment alternatives and goals were discussed and mutually agreed upon: by patient and No family available/patient unable (per MD note).  SBerlin CInman3/14/2015, 12:49 PM

## 2013-11-08 LAB — GLUCOSE, CAPILLARY
GLUCOSE-CAPILLARY: 172 mg/dL — AB (ref 70–99)
Glucose-Capillary: 137 mg/dL — ABNORMAL HIGH (ref 70–99)
Glucose-Capillary: 130 mg/dL — ABNORMAL HIGH (ref 70–99)
Glucose-Capillary: 146 mg/dL — ABNORMAL HIGH (ref 70–99)

## 2013-11-08 MED ORDER — LISINOPRIL 10 MG PO TABS
10.0000 mg | ORAL_TABLET | Freq: Every day | ORAL | Status: DC
  Administered 2013-11-08 – 2013-11-13 (×6): 10 mg via ORAL
  Filled 2013-11-08 (×7): qty 1

## 2013-11-08 NOTE — Progress Notes (Signed)
Riley Stuart is a 63 y.o. male with history of DM, CKD, schizoaffective disorder. He was admitted to [facility] from his group home on 10/26/13 with three day h/o dizziness and headache. CT head with SAH. 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 Kathyrn Sheriff on 10/27/13. He developed pink tinged drainage from nose and was placed on bedrest due to CSF leak. Is on nimotop with TCD done to monitor for vasospasms. He continues to have fevers and was pan-cultured 11/05/13. Rehab team recommended CIR and patient admitted today.   Patient denies complaints today. He feels well and is eating well.  BP 135/66  Pulse 76  Temp(Src) 98.6 F (37 C) (Oral)  Resp 19  SpO2 96%  Overweight male in no acute distress. Chest clear to auscultation. Cardiac exam S1 and S2 are regular. Abdominal exam active bowel sounds, soft. Extremities no edema. HEENT exam: Scalp wound is healing nicely without erythema or exudate.   Post Admission Physician Evaluation:  1. 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  Medical Problem List and Plan:  SAH d/t A-com aneurysm  1. DVT Prophylaxis/Anticoagulation: Pharmaceutical: Lovenox  2. Pain Management: Currently well controlled. 3. Mood: No signs of distress or agitation. LCSW to follow for evaluation.  4. Neuropsych: This patient is not capable of making decisions on his own behalf.  5. FUO: will continue to monitor. Clinically there are no signs or symptoms of infection..  6. Schizoaffective disorder: Back on geodon but not on home dose yet. Off Lamictal at this time--question resume. Reports of agitation at nights.--follow sleep pattern  7. DM type 2: Used 70/30 insulin at home--50 u/am and 35 u/pm. On SSI at this time. Will resume low dose 70/30 insulin as po intake good.  CBG (last 3)   Recent Labs  11/07/13 1645 11/07/13 2131 11/08/13 0659  GLUCAP 147* 143* 137*   fair control. Continue to monitor.  8. CKD: Renal status normal at this time. Will monitor for now--off Diovan/HCTZ.  9. HTN: Monitor with bid checks. Blood pressure range 124-181/97-79. BP Readings from Last 3 Encounters:  11/08/13 135/66  11/06/13 154/76  11/06/13 154/76   Given significant fluctuation and hypertension. I will adjust medications. Add lisinopril 10 mg by mouth daily.

## 2013-11-09 ENCOUNTER — Encounter (HOSPITAL_COMMUNITY): Payer: Self-pay

## 2013-11-09 ENCOUNTER — Inpatient Hospital Stay (HOSPITAL_COMMUNITY): Payer: Medicare Other | Admitting: *Deleted

## 2013-11-09 ENCOUNTER — Inpatient Hospital Stay (HOSPITAL_COMMUNITY): Payer: Medicare Other | Admitting: Speech Pathology

## 2013-11-09 ENCOUNTER — Inpatient Hospital Stay (HOSPITAL_COMMUNITY): Payer: Medicare Other

## 2013-11-09 ENCOUNTER — Encounter (HOSPITAL_COMMUNITY): Payer: Medicare Other

## 2013-11-09 DIAGNOSIS — E119 Type 2 diabetes mellitus without complications: Secondary | ICD-10-CM

## 2013-11-09 DIAGNOSIS — I10 Essential (primary) hypertension: Secondary | ICD-10-CM

## 2013-11-09 DIAGNOSIS — I609 Nontraumatic subarachnoid hemorrhage, unspecified: Secondary | ICD-10-CM

## 2013-11-09 DIAGNOSIS — N189 Chronic kidney disease, unspecified: Secondary | ICD-10-CM

## 2013-11-09 LAB — COMPREHENSIVE METABOLIC PANEL
ALT: 14 U/L (ref 0–53)
AST: 10 U/L (ref 0–37)
Albumin: 3.1 g/dL — ABNORMAL LOW (ref 3.5–5.2)
Alkaline Phosphatase: 53 U/L (ref 39–117)
BILIRUBIN TOTAL: 0.5 mg/dL (ref 0.3–1.2)
BUN: 20 mg/dL (ref 6–23)
CHLORIDE: 106 meq/L (ref 96–112)
CO2: 21 meq/L (ref 19–32)
Calcium: 9.7 mg/dL (ref 8.4–10.5)
Creatinine, Ser: 1.51 mg/dL — ABNORMAL HIGH (ref 0.50–1.35)
GFR calc Af Amer: 55 mL/min — ABNORMAL LOW (ref >90)
GFR, EST NON AFRICAN AMERICAN: 47 mL/min — AB (ref >90)
Glucose, Bld: 135 mg/dL — ABNORMAL HIGH (ref 70–99)
Potassium: 4 mEq/L (ref 3.7–5.3)
SODIUM: 142 meq/L (ref 137–147)
Total Protein: 6.3 g/dL (ref 6.0–8.3)

## 2013-11-09 LAB — CBC WITH DIFFERENTIAL/PLATELET
BASOS ABS: 0 10*3/uL (ref 0.0–0.1)
Basophils Relative: 0 % (ref 0–1)
Eosinophils Absolute: 0 10*3/uL (ref 0.0–0.7)
Eosinophils Relative: 0 % (ref 0–5)
HCT: 33.7 % — ABNORMAL LOW (ref 39.0–52.0)
Hemoglobin: 12 g/dL — ABNORMAL LOW (ref 13.0–17.0)
LYMPHS PCT: 19 % (ref 12–46)
Lymphs Abs: 1.8 10*3/uL (ref 0.7–4.0)
MCH: 31.7 pg (ref 26.0–34.0)
MCHC: 35.6 g/dL (ref 30.0–36.0)
MCV: 88.9 fL (ref 78.0–100.0)
Monocytes Absolute: 0.8 10*3/uL (ref 0.1–1.0)
Monocytes Relative: 9 % (ref 3–12)
NEUTROS ABS: 6.7 10*3/uL (ref 1.7–7.7)
Neutrophils Relative %: 72 % (ref 43–77)
PLATELETS: 400 10*3/uL (ref 150–400)
RBC: 3.79 MIL/uL — ABNORMAL LOW (ref 4.22–5.81)
RDW: 14.8 % (ref 11.5–15.5)
WBC: 9.3 10*3/uL (ref 4.0–10.5)

## 2013-11-09 LAB — GLUCOSE, CAPILLARY
GLUCOSE-CAPILLARY: 136 mg/dL — AB (ref 70–99)
GLUCOSE-CAPILLARY: 147 mg/dL — AB (ref 70–99)
Glucose-Capillary: 135 mg/dL — ABNORMAL HIGH (ref 70–99)
Glucose-Capillary: 143 mg/dL — ABNORMAL HIGH (ref 70–99)

## 2013-11-09 MED ORDER — INFLUENZA VAC SPLIT QUAD 0.5 ML IM SUSP
0.5000 mL | INTRAMUSCULAR | Status: AC
  Administered 2013-11-10: 0.5 mL via INTRAMUSCULAR
  Filled 2013-11-09: qty 0.5

## 2013-11-09 MED ORDER — ZIPRASIDONE HCL 80 MG PO CAPS
80.0000 mg | ORAL_CAPSULE | Freq: Every day | ORAL | Status: DC
  Administered 2013-11-09 – 2013-11-13 (×5): 80 mg via ORAL
  Filled 2013-11-09 (×6): qty 1

## 2013-11-09 MED ORDER — LAMOTRIGINE 100 MG PO TABS
100.0000 mg | ORAL_TABLET | Freq: Two times a day (BID) | ORAL | Status: DC
  Administered 2013-11-09 – 2013-11-13 (×9): 100 mg via ORAL
  Filled 2013-11-09 (×13): qty 1

## 2013-11-09 MED ORDER — ZIPRASIDONE HCL 80 MG PO CAPS
160.0000 mg | ORAL_CAPSULE | Freq: Every day | ORAL | Status: DC
  Administered 2013-11-09 – 2013-11-12 (×4): 160 mg via ORAL
  Filled 2013-11-09 (×5): qty 2

## 2013-11-09 MED ORDER — ZIPRASIDONE HCL 80 MG PO CAPS: 80.0000 mg | ORAL_CAPSULE | Freq: Two times a day (BID) | ORAL | Status: DC

## 2013-11-09 NOTE — Progress Notes (Signed)
Subjective/Complaints:  Denies pain. Had a good weekend. Ready for therapies today A 12 point review of systems has been performed and if not noted above is otherwise negative.  Objective: Vital Signs: Blood pressure 132/75, pulse 72, temperature 98.6 F (37 C), temperature source Oral, resp. rate 18, SpO2 97.00%. No results found.  Recent Labs  11/09/13 0612  WBC 9.3  HGB 12.0*  HCT 33.7*  PLT 400    Recent Labs  11/09/13 0612  NA 142  K 4.0  CL 106  GLUCOSE 135*  BUN 20  CREATININE 1.51*  CALCIUM 9.7   CBG (last 3)   Recent Labs  11/08/13 1646 11/08/13 2113 11/09/13 0715  GLUCAP 172* 146* 136*    Wt Readings from Last 3 Encounters:  10/26/13 122.1 kg (269 lb 2.9 oz)  10/26/13 122.1 kg (269 lb 2.9 oz)  10/26/13 122.1 kg (269 lb 2.9 oz)    Physical Exam:  Constitutional: He is oriented to person, place, and time. He appears well-developed and well-nourished.  Polite and appropriate.  HENT:  Head: Normocephalic.  Left crani incision with staples intact.  Eyes: Conjunctivae are normal. Pupils are equal, round, and reactive to light.  Neck: Normal range of motion.  Cardiovascular: Normal rate and regular rhythm.  No murmur heard.  Respiratory: Effort normal and breath sounds normal. No respiratory distress. No wheezes  GI: Soft. Bowel sounds are normal. He exhibits no distension. There is no tenderness.  Musculoskeletal: He exhibits no edema.  Well healed old scars on left knee and left ankle.  Neurological: He is alert and oriented to person, place, and time.    Tracks to all visual fields. Speech clearer. Follows all simple commands. Has intact basic insight and awareness. Oriented to day, date, place. Strength RUE 4/5 bic,tri,delt, hand/wrist. LUE 4+ prox to distal. RLE 3/5 HF, 3+ KE and ankle. LLE is 3+ HF, 4/5 knee, 4/5 ankle. No gross sensory deficits. Skin: Skin is warm and dry.  Psychiatric:  Calm, Pleasant.    Assessment/Plan: 1.  Functional deficits secondary to [name] which require 3+ hours per day of interdisciplinary therapy in a comprehensive inpatient rehab setting. Physiatrist is providing close team supervision and 24 hour management of active medical problems listed below. Physiatrist and rehab team continue to assess barriers to discharge/monitor patient progress toward functional and medical goals. FIM: FIM - Bathing Bathing Steps Patient Completed: Chest;Right Arm;Left Arm;Abdomen;Front perineal area;Buttocks;Right upper leg;Left upper leg Bathing: 4: Min-Patient completes 8-9 89f 10 parts or 75+ percent  FIM - Upper Body Dressing/Undressing Upper body dressing/undressing steps patient completed: Thread/unthread right sleeve of pullover shirt/dresss;Thread/unthread left sleeve of pullover shirt/dress;Put head through opening of pull over shirt/dress;Pull shirt over trunk Upper body dressing/undressing: 5: Set-up assist to: Obtain clothing/put away FIM - Lower Body Dressing/Undressing Lower body dressing/undressing steps patient completed: Thread/unthread right underwear leg;Thread/unthread left underwear leg;Pull underwear up/down;Thread/unthread right pants leg;Thread/unthread left pants leg;Pull pants up/down;Don/Doff right sock;Don/Doff left sock;Don/Doff right shoe;Don/Doff left shoe (slide on shower shoes) Lower body dressing/undressing: 5: Set-up assist to: Obtain clothing  FIM - Toileting Toileting steps completed by patient: Performs perineal hygiene Toileting Assistive Devices: Grab bar or rail for support Toileting: 2: Max-Patient completed 1 of 3 steps  FIM - Radio producer Devices: Product manager Transfers: 3-To toilet/BSC: Mod A (lift or lower assist)  FIM - Control and instrumentation engineer Devices: Arm rests Bed/Chair Transfer: 5: Supine > Sit: Supervision (verbal cues/safety issues);4:  Sit > Supine: Min A (steadying pt. > 75%/lift 1 leg);4: Bed >  Chair or W/C: Min A (steadying Pt. > 75%);4: Chair or W/C > Bed: Min A (steadying Pt. > 75%)  FIM - Locomotion: Wheelchair Distance: 70 Locomotion: Wheelchair: 2: Travels 50 - 149 ft with minimal assistance (Pt.>75%) FIM - Locomotion: Ambulation Locomotion: Ambulation Assistive Devices: Administrator Ambulation/Gait Assistance: 4: Min assist Locomotion: Ambulation: 1: Travels less than 50 ft with minimal assistance (Pt.>75%)  Comprehension Comprehension Mode: Auditory Comprehension: 5-Understands basic 90% of the time/requires cueing < 10% of the time  Expression Expression Mode: Verbal Expression: 5-Expresses complex 90% of the time/cues < 10% of the time  Social Interaction Social Interaction: 4-Interacts appropriately 75 - 89% of the time - Needs redirection for appropriate language or to initiate interaction.  Problem Solving Problem Solving: 3-Solves basic 50 - 74% of the time/requires cueing 25 - 49% of the time  Memory Memory: 4-Recognizes or recalls 75 - 89% of the time/requires cueing 10 - 24% of the time  Medical Problem List and Plan:  SAH d/t A-com aneurysm  1. DVT Prophylaxis/Anticoagulation: Pharmaceutical: Lovenox  2. Pain Management: continue hydrocodone prn for HA  3. Mood: No signs of distress or agitation. LCSW to follow for evaluation.  4. Neuropsych: This patient is not capable of making decisions on his own behalf.  5. FUO: will continue to monitor. ?CNS related. Await culture results. Reactive leucocytosis resolving.  6. Schizoaffective disorder: resume lamictal and geodon at home doses. 7. DM type 2: Used 70/30 insulin at home--50 u/am and 35 u/pm. On SSI at this time. resumed low dose 70/30 insulin 5u bid with good results so far  8. CKD: Renal status normal at this time. -off Diovan/HCTZ currently.   -CR 1.5 today 9. HTN: Monitor with bid checks. Resumed metoprolol at lower dose and titrate up as indicated--monitor HR. Off Diovan/HCTZ at this time.     LOS (Days) 3 A FACE TO FACE EVALUATION WAS PERFORMED  Analilia Geddis T 11/09/2013 8:17 AM

## 2013-11-09 NOTE — Progress Notes (Signed)
Speech Language Pathology Daily Session Note  Patient Details  Name: MARISOL LINKENHOKER MRN: [id number] Date of Birth: 1950/11/19  Today's Date: 11/09/2013 Time: 1015-1100 Time Calculation (min): 45 min  Short Term Goals: Week 1: SLP Short Term Goal 1 (Week 1): Pt will utilize external memory aids to recall new, daily information with supervision verbal and question cues.  SLP Short Term Goal 2 (Week 1): Pt will utilize call bell to request assistance with supervision question cues.  SLP Short Term Goal 3 (Week 1): Pt will demonstrate functional problem solving for basic and familiar tasks with supervision verbal cues.   Skilled Therapeutic Interventions: Skilled treatment session focused on cognitive-linguistic goals. SLP facilitated session by providing supervision question cues for utilization of external aids to recall previous therapy sessions. SLP also provided 4 step sequencing picture cards and pt completed task with Mod I and provided a verbal description of task with Mod I. Pt required Min verbal and visual cues to self-monitor and correct errors with 6 step sequencing task with picture cards.  Pt named functional items with Mod I, participated in category exclusion task with Mod I and convergent naming task with Mod I. Continue with current plan of care.     FIM:  Comprehension Comprehension Mode: Auditory Comprehension: 5-Understands basic 90% of the time/requires cueing < 10% of the time Expression Expression Mode: Verbal Expression: 5-Expresses complex 90% of the time/cues < 10% of the time Social Interaction Social Interaction: 4-Interacts appropriately 75 - 89% of the time - Needs redirection for appropriate language or to initiate interaction. Problem Solving Problem Solving: 4-Solves basic 75 - 89% of the time/requires cueing 10 - 24% of the time Memory Memory: 4-Recognizes or recalls 75 - 89% of the time/requires cueing 10 - 24% of the time  Pain Pain  Assessment Pain Assessment: No/denies pain  Therapy/Group: Individual Therapy  Marlon Vonruden, [place] 11/09/2013, 11:11 AM

## 2013-11-09 NOTE — Progress Notes (Signed)
Physical Therapy Session Note  Patient Details  Name: Riley Stuart MRN: [id number] Date of Birth: 1950-12-02  Today's Date: 11/09/2013 Time: 1100-1150 Time Calculation (min): 50 min  Short Term Goals: Week 1:  PT Short Term Goal 1 (Week 1): Pt will be able to transfer from various level surfaces/heights to promote OOB activity at supervision level to promote increased functional independence.  PT Short Term Goal 2 (Week 1): Pt will be able to ambulate 25-50 feet using the LRAD with stand by assist to promote functional mobility within the home.  PT Short Term Goal 3 (Week 1): Pt will be able to tolerate 10-15 reps of lower extremity ther ex to promote increased strength and ROM for improved functional mobility and endurance.  PT Short Term Goal 4 (Week 1): Pt will be able to propel wheel chair 100 feet  on level surfaces at supervision level in order to promote functional mobility within the home and community, allowing pt to attend meals in the dinning area.  Skilled Therapeutic Interventions/Progress Updates:  1:1. Pt received sitting in recliner, ready for therapy. Focus this session on w/c propulsion for functional endurance and memory. Pt able to propel w/c 150-200'x4 bouts w/ overall (S) in controlled and home environments with min cues for management and significantly increased time for propulsion. Despite education regarding benefits, pt declined ambulating in [place] (for calm environment) stating, "we'll do that later, just not now." Pt demonstrating mild difficulty recalling functional tasks performed in speech and occupational therapies earlier in morning as well as pathfinding on/off unit w/ mod cueing. Pt agreeable to amb 25' from w/c in hall to recliner in room, req min guard A and min cueing for safe management of RW. Pt sitting in recliner at end of session w/ all needs in reach, chair alarm in place and nurse tech in room.   Therapy Documentation Precautions:   Precautions Precautions: Fall Precaution Comments: pt with known psychosis, declines use of a belt or lap tray for restraint. Restrictions Weight Bearing Restrictions: No Pain: Pain Assessment Pain Assessment: No/denies pain Pain Score: 0-No pain  See FIM for current functional status  Therapy/Group: Individual Therapy  Gilmore Laroche 11/09/2013, 12:40 PM

## 2013-11-09 NOTE — IPOC Note (Signed)
Overall Plan of Care [facility]) Patient Details Name: Riley Stuart MRN: [id number] DOB: Mar 10, 1951  Admitting Diagnosis: sah s/p crani  Hospital Problems: Active Problems:   Cerebral aneurysm rupture     Functional Problem List: Nursing Behavior;Bladder;Bowel;Skin Integrity;Medication Management;Safety;Pain;Motor  PT Balance;Endurance;Motor;Safety;Sensory  OT Balance;Cognition;Endurance;Safety  SLP Cognition  TR         Basic ADL's: OT Grooming;Bathing;Dressing;Toileting     Advanced  ADL's: OT       Transfers: PT Bed Mobility;Bed to Chair;Car;Furniture  OT Toilet;Tub/Shower     Locomotion: PT Ambulation;Wheelchair Mobility     Additional Impairments: OT    SLP Social Cognition   Problem Solving;Memory;Awareness  TR      Anticipated Outcomes Item Anticipated Outcome  Self Feeding    Swallowing      Basic self-care  Supervision  Toileting  Mod I   Bathroom Transfers Supervision  Bowel/Bladder  To be continent of bladder and bowel  Transfers  Modified Independent  Locomotion  Supervision  Communication     Cognition  Supervision   Pain  <3  Safety/Judgment  Mod I assist   Therapy Plan: PT Intensity: Minimum of 1-2 x/day ,45 to 90 minutes PT Frequency: 5 out of 7 days PT Duration Estimated Length of Stay: 14 days (2 weeks) OT Intensity: Minimum of 1-2 x/day, 45 to 90 minutes OT Frequency: 5 out of 7 days OT Duration/Estimated Length of Stay: 8-10 days SLP Intensity: Minumum of 1-2 x/day, 30 to 90 minutes SLP Frequency: 5 out of 7 days SLP Duration/Estimated Length of Stay: 8-12 days       Team Interventions: Nursing Interventions Bladder Management;Patient/Family Education;Medication Management;Pain Management;Bowel Management  PT interventions Ambulation/gait training;Balance/vestibular training;Cognitive remediation/compensation;Community reintegration;Discharge planning;Disease management/prevention;DME/adaptive equipment  instruction;Functional electrical stimulation;Functional mobility training;Neuromuscular re-education;Pain management;Patient/family education;Psychosocial support;Splinting/orthotics;Stair training;Therapeutic Activities;Therapeutic Exercise;UE/LE Strength taining/ROM;UE/LE Coordination activities;Wheelchair propulsion/positioning  OT Interventions Balance/vestibular training;DME/adaptive equipment instruction;Cognitive remediation/compensation;Discharge planning;Patient/family education;Self Care/advanced ADL retraining;Therapeutic Activities  SLP Interventions Cognitive remediation/compensation;Cueing hierarchy;Functional tasks;Internal/external aids;Environmental controls;Patient/family education;Therapeutic Activities  TR Interventions    SW/CM Interventions      Team Discharge Planning: Destination: PT-Home (Return to [place] setting) ,OT- Home ([place]) , SLP- ([place] where he was living PTA) Projected Follow-up: PT-Home health PT;24 hour supervision/assistance, OT-  Home health OT;24 hour supervision/assistance, SLP-None Projected Equipment Needs: PT-Rolling walker with 5" wheels (TBD with pt progression), OT-  (TBA), SLP-None recommended by SLP Equipment Details: PT-Pt may benefit from use of w/c if ambulation does not progress. Group home do have w/c for pt to access if needed. , OT-Patient has a tub shower combo that he shares with a roommate.  May recommend a tub bench if slow to progress. Patient/family involved in discharge planning: PT- Patient,  OT-Patient, SLP-Patient  MD ELOS: 10-12 days Medical Rehab Prognosis:  Excellent Assessment: The patient has been admitted for CIR therapies. The team will be addressing, functional mobility, strength, stamina, balance, safety, adaptive techniques/equipment, self-care, bowel and bladder mgt, patient and caregiver education, NMR, coordination, pain mgt, visual spatial awareness, communication, cognitive perceptual awareness,  attention, cognition. Goals have been set at supervision to mod I with mobility, self-care, and cognition/communication.    Meredith Staggers, MD, FAAPMR      See Team Conference Notes for weekly updates to the plan of care

## 2013-11-09 NOTE — Progress Notes (Signed)
Patient information reviewed and entered into eRehab system by Marcey Persad, RN, CRRN, PPS Coordinator.  Information including medical coding and functional independence measure will be reviewed and updated through discharge.     Per nursing patient was given "Data Collection Information Summary for Patients in Inpatient Rehabilitation Facilities with attached "Privacy Act Statement-Health Care Records" upon admission.  

## 2013-11-09 NOTE — Progress Notes (Signed)
Vascular Ultrasound Transcranial Doppler has been completed.   11/09/2013 5:56 PM Landry Mellow, RVT, RDMS Maudry Mayhew, RVT, RDCS, RDMS Vevelyn Royals, RVT

## 2013-11-09 NOTE — Progress Notes (Signed)
Occupational Therapy Note  Patient Details  Name: Riley Stuart MRN: [id number] Date of Birth: [birth year] Today's Date: 11/09/2013 Time: 1415- [id number]  (30 min) Pain:  none Individual session Pt. stting in wc upon OT arrival.  Pt agrred to ambulated to sink and perform grooming task.  Went from sit to stand with SBA.  Ambulated with RW over to sink.  Pt was supervision with activities involving hair and teeth.  He stood for about 5 minutes with supervision.  Ambulated back to recliner.  Left call bell in reach and phone.  Lisa Roca 11/09/2013, 2:46 PM

## 2013-11-09 NOTE — Progress Notes (Signed)
Occupational Therapy Session Note  Patient Details  Name: Riley Stuart MRN: [id number] Date of Birth: 07/30/1951  Today's Date: 11/09/2013 Time: 0800-0855 Time Calculation (min): 55 min  Short Term Goals: Week 1:  OT Short Term Goal 1 (Week 1): Groom:  Patient will stand with Supervision for at least 3 grooming tasks without a seated rest break OT Short Term Goal 2 (Week 1): Dressing:  Patient will ambulate to gather clothes to prepare for self tasks. OT Short Term Goal 3 (Week 1): Toileting:  Supervision with 3/3 hygiene tasks in sit and stand. OT Short Term Goal 4 (Week 1): Toilet Transfer:  Supervision for toilet transfer  Skilled Therapeutic Interventions/Progress Updates:    Pt asleep in bed upon arrival but easily aroused and willing to participate in bathing and dressing tasks w/c level at sink.  Pt required mod verbal cues for task initiation throughout session.  Attempted to engage patient in walking to sink from bed but patient declined stating that he could just stand and turn to sit in w/c.  Pt completed bathing and dressing tasks with sit<>stand requiring steady A.  Pt declined to wash feet this morning.  Pt transitioned to ADL apartment and discussed living arrangements at group home. Focus on activity tolerance, safety awareness, sit<>stand, and standing balance.   Therapy Documentation Precautions:  Precautions Precautions: Fall Precaution Comments: pt with known psychosis, declines use of a belt or lap tray for restraint. Restrictions Weight Bearing Restrictions: No Pain: Pain Assessment Pain Assessment: No/denies pain  See FIM for current functional status  Therapy/Group: Individual Therapy  Leroy Libman 11/09/2013, 9:03 AM

## 2013-11-10 ENCOUNTER — Inpatient Hospital Stay (HOSPITAL_COMMUNITY): Payer: Self-pay

## 2013-11-10 ENCOUNTER — Inpatient Hospital Stay (HOSPITAL_COMMUNITY): Payer: Medicare Other | Admitting: Speech Pathology

## 2013-11-10 ENCOUNTER — Encounter (HOSPITAL_COMMUNITY): Payer: Self-pay

## 2013-11-10 LAB — GLUCOSE, CAPILLARY
GLUCOSE-CAPILLARY: 145 mg/dL — AB (ref 70–99)
Glucose-Capillary: 139 mg/dL — ABNORMAL HIGH (ref 70–99)
Glucose-Capillary: 171 mg/dL — ABNORMAL HIGH (ref 70–99)
Glucose-Capillary: 142 mg/dL — ABNORMAL HIGH (ref 70–99)

## 2013-11-10 MED ORDER — CIPROFLOXACIN HCL 250 MG PO TABS
250.0000 mg | ORAL_TABLET | Freq: Two times a day (BID) | ORAL | Status: DC
  Administered 2013-11-10 – 2013-11-13 (×7): 250 mg via ORAL
  Filled 2013-11-10 (×9): qty 1

## 2013-11-10 NOTE — Progress Notes (Signed)
Physical Therapy Session Note  Patient Details  Name: Riley Stuart MRN: [id number] Date of Birth: 1951-03-24  Today's Date: 11/10/2013 Time: Treatment Session 1: [id number]; Treatment Session 2: 1100-1155 Time Calculation (min): Treatment Session 1: 25 min; Treatment Session 2: 93min  Short Term Goals: Week 1:  PT Short Term Goal 1 (Week 1): Pt will be able to transfer from various level surfaces/heights to promote OOB activity at supervision level to promote increased functional independence.  PT Short Term Goal 2 (Week 1): Pt will be able to ambulate 25-50 feet using the LRAD with stand by assist to promote functional mobility within the home.  PT Short Term Goal 3 (Week 1): Pt will be able to tolerate 10-15 reps of lower extremity ther ex to promote increased strength and ROM for improved functional mobility and endurance.  PT Short Term Goal 4 (Week 1): Pt will be able to propel wheel chair 100 feet  on level surfaces at supervision level in order to promote functional mobility within the home and community, allowing pt to attend meals in the dinning area.  Skilled Therapeutic Interventions/Progress Updates:  Treatment Session 1:  1:1. Pt received semi-reclined in bed. Pt very content in nature often stating, "no that's ok, we can maybe try that later" and req mod encouragement to perform mobility unless relating to ADLs. With increased time, pt agreeable to perform light grooming at sink to target bed mobility, t/f sit<>stand and amb bed<>sink. Pt req close(S) overall w/ use of RW demonstrating very slow movement and shuffled steps, min cueing for safe management of RW. Pt supine in bed at end of session in care of nurse to address central line at end of session w/ bed alarm on.   Treatment Session 2:  1:1. Pt received sitting in recliner in care of RN. Focus this session on functional endurance and safe management of RW during standing functional mobility w/ min encouragement. Pt req  close(S)-min guard A throughout tx session for all mobility. Min-mod cueing for keeping RW close to body during turns and amb. Pt w/ good tolerance to amb 40'x2, w/c propulsion 75'x2 and use of NuStep, level 3- 75min and 73min. Pt demonstrates very shuffled steps and propulsion despite cueing for improved technique w/ pt stating, "I'm just doing what I can." Pt sitting in recliner at end of session w/ all needs in reach, chair alarm in place.   Therapy Documentation Precautions:  Precautions Precautions: Fall Precaution Comments: pt with known psychosis, declines use of a belt or lap tray for restraint. Restrictions Weight Bearing Restrictions: No General:   Vital Signs: Therapy Vitals Temp: 99.2 F (37.3 C) Temp src: Oral Pulse Rate: 63 Resp: 18 BP: 111/56 mmHg Patient Position, if appropriate: Lying Oxygen Therapy SpO2: 98 % O2 Device: None (Room air)  See FIM for current functional status  Therapy/Group: Individual Therapy  Gilmore Laroche 11/10/2013, 9:00 AM

## 2013-11-10 NOTE — Progress Notes (Signed)
Subjective/Complaints:  Doing well. Encouraged by progress. Pain is generally mild in his head and legs. A 12 point review of systems has been performed and if not noted above is otherwise negative.  Objective: Vital Signs: Blood pressure 111/56, pulse 63, temperature 99.2 F (37.3 C), temperature source Oral, resp. rate 18, SpO2 98.00%. No results found.  Recent Labs  11/09/13 0612  WBC 9.3  HGB 12.0*  HCT 33.7*  PLT 400    Recent Labs  11/09/13 0612  NA 142  K 4.0  CL 106  GLUCOSE 135*  BUN 20  CREATININE 1.51*  CALCIUM 9.7   CBG (last 3)   Recent Labs  11/09/13 1704 11/09/13 2003 11/10/13 0738  GLUCAP 147* 143* 139*    Wt Readings from Last 3 Encounters:  10/26/13 122.1 kg (269 lb 2.9 oz)  10/26/13 122.1 kg (269 lb 2.9 oz)  10/26/13 122.1 kg (269 lb 2.9 oz)    Physical Exam:  Constitutional: He is oriented to person, place, and time. He appears well-developed and well-nourished.  Polite and appropriate.  HENT:  Head: Normocephalic.  Left crani incision with staples intact.  Eyes: Conjunctivae are normal. Pupils are equal, round, and reactive to light.  Neck: Normal range of motion.  Cardiovascular: Normal rate and regular rhythm.  No murmur heard.  Respiratory: Effort normal and breath sounds normal. No respiratory distress. No wheezes  GI: Soft. Bowel sounds are normal. He exhibits no distension. There is no tenderness.  Musculoskeletal: He exhibits no edema.  Well healed old scars on left knee and left ankle.  Neurological: He is alert and oriented to person, place, and time.    Tracks to all visual fields. Speech clearer. Follows all simple commands. Has intact basic insight and awareness. Oriented to day, date, place. Strength RUE 4/5 bic,tri,delt, hand/wrist. LUE 4+ prox to distal. RLE 3/5 HF, 3+ KE and ankle. LLE is 3+ HF, 4/5 knee, 4/5 ankle. No gross sensory deficits. Skin: Skin is warm and dry.  Psychiatric:  Calm, Pleasant.     Assessment/Plan: 1. Functional deficits secondary to [facility] which require 3+ hours per day of interdisciplinary therapy in a comprehensive inpatient rehab setting. Physiatrist is providing close team supervision and 24 hour management of active medical problems listed below. Physiatrist and rehab team continue to assess barriers to discharge/monitor patient progress toward functional and medical goals. FIM: FIM - Bathing Bathing Steps Patient Completed: Chest;Right Arm;Left Arm;Abdomen;Front perineal area;Buttocks;Right upper leg;Left upper leg Bathing: 4: Min-Patient completes 8-9 3f 10 parts or 75+ percent  FIM - Upper Body Dressing/Undressing Upper body dressing/undressing steps patient completed: Thread/unthread right sleeve of pullover shirt/dresss;Thread/unthread left sleeve of pullover shirt/dress;Put head through opening of pull over shirt/dress;Pull shirt over trunk Upper body dressing/undressing: 5: Set-up assist to: Obtain clothing/put away FIM - Lower Body Dressing/Undressing Lower body dressing/undressing steps patient completed: Thread/unthread right underwear leg;Thread/unthread left underwear leg;Pull underwear up/down;Thread/unthread right pants leg;Thread/unthread left pants leg;Pull pants up/down;Don/Doff right sock;Don/Doff left sock;Don/Doff right shoe;Don/Doff left shoe Lower body dressing/undressing: 5: Set-up assist to: Don/Doff TED stocking  FIM - Toileting Toileting steps completed by patient: Adjust clothing prior to toileting;Performs perineal hygiene Toileting Assistive Devices: Grab bar or rail for support Toileting: 3: Mod-Patient completed 2 of 3 steps  FIM - Radio producer Devices: Grab bars Toilet Transfers: 4-To toilet/BSC: Min A (steadying Pt. > 75%);4-From toilet/BSC: Min A (steadying Pt. > 75%)  FIM - Control and instrumentation engineer Devices:  Arm rests Bed/Chair Transfer: 4: Bed > Chair or W/C: Min A  (steadying Pt. > 75%);4: Chair or W/C > Bed: Min A (steadying Pt. > 75%)  FIM - Locomotion: Wheelchair Distance: 70 Locomotion: Wheelchair: 5: Travels 150 ft or more: maneuvers on rugs and over door sills with supervision, cueing or coaxing FIM - Locomotion: Ambulation Locomotion: Ambulation Assistive Devices: Administrator Ambulation/Gait Assistance: 4: Min assist Locomotion: Ambulation: 1: Travels less than 50 ft with minimal assistance (Pt.>75%)  Comprehension Comprehension Mode: Auditory Comprehension: 5-Understands basic 90% of the time/requires cueing < 10% of the time  Expression Expression Mode: Verbal Expression: 3-Expresses basic 50 - 74% of the time/requires cueing 25 - 50% of the time. Needs to repeat parts of sentences.  Social Interaction Social Interaction: 4-Interacts appropriately 75 - 89% of the time - Needs redirection for appropriate language or to initiate interaction.  Problem Solving Problem Solving: 3-Solves basic 50 - 74% of the time/requires cueing 25 - 49% of the time  Memory Memory: 4-Recognizes or recalls 75 - 89% of the time/requires cueing 10 - 24% of the time  Medical Problem List and Plan:  SAH d/t A-com aneurysm  1. DVT Prophylaxis/Anticoagulation: Pharmaceutical: Lovenox  2. Pain Management: continue hydrocodone prn for HA and leg pain---generally controlled 3. Mood: No signs of distress or agitation. LCSW to follow for evaluation.  4. Neuropsych: This patient is not capable of making decisions on his own behalf.  5. FUO: will continue to monitor. Improved but urine positive for E coli---begin cipro for 5 days  6. Schizoaffective disorder: resume lamictal and geodon at home doses. 7. DM type 2: Used 70/30 insulin at home--50 u/am and 35 u/pm. On SSI at this time. resumed low dose 70/30 insulin 5u bid with good results so far  8. CKD: Renal status normal at this time. -off Diovan/HCTZ currently.   -CR 1.5  9. HTN: Monitor with bid checks.  Resumed metoprolol at lower dose and titrate up as indicated--monitor HR. Off Diovan/HCTZ at this time.   LOS (Days) 4 A FACE TO FACE EVALUATION WAS PERFORMED  Zoa Dowty T 11/10/2013 7:48 AM

## 2013-11-10 NOTE — Plan of Care (Signed)
Problem: RH SAFETY Goal: RH STG ADHERE TO SAFETY PRECAUTIONS W/ASSISTANCE/DEVICE STG Adhere to Safety Precautions With supervision Assistance/Device.  Outcome: Progressing Using bed and chair alarm,gets up w/o assistance despite reminders,toilet q 3 hrs

## 2013-11-10 NOTE — Progress Notes (Signed)
Speech Language Pathology Daily Session Note  Patient Details  Name: Riley Stuart MRN: [id number] Date of Birth: 02-Jul-1951  Today's Date: 11/10/2013 Time: 1300-1330 Time Calculation (min): 30 min  Short Term Goals: Week 1: SLP Short Term Goal 1 (Week 1): Pt will utilize external memory aids to recall new, daily information with supervision verbal and question cues.  SLP Short Term Goal 2 (Week 1): Pt will utilize call bell to request assistance with supervision question cues.  SLP Short Term Goal 3 (Week 1): Pt will demonstrate functional problem solving for basic and familiar tasks with supervision verbal cues.   Skilled Therapeutic Interventions: Skilled therapeutic session addressed cognitive goals. Student facilitated session by providing Max verbal cues for safety awareness and Mod verbal cues for use of call bell. Student provided an external aid to assist in recall of use of call bell. Patient required Min verbal cues to complete a 6 step sequencing task with picture cards. Continue with current plan of care.    FIM:  Comprehension Comprehension Mode: Auditory Comprehension: 5-Understands basic 90% of the time/requires cueing < 10% of the time Expression Expression Mode: Verbal Expression: 4-Expresses basic 75 - 89% of the time/requires cueing 10 - 24% of the time. Needs helper to occlude trach/needs to repeat words. Social Interaction Social Interaction: 4-Interacts appropriately 75 - 89% of the time - Needs redirection for appropriate language or to initiate interaction. Problem Solving Problem Solving: 3-Solves basic 50 - 74% of the time/requires cueing 25 - 49% of the time Memory Memory: 4-Recognizes or recalls 75 - 89% of the time/requires cueing 10 - 24% of the time FIM - Eating Eating Activity: 0: Activity did not occur  Pain Pain Assessment Pain Assessment: No/denies pain  Therapy/Group: Individual Therapy  Laityn Bensen 11/10/2013, 1:51 PM

## 2013-11-10 NOTE — Progress Notes (Signed)
The skilled treatment note has been reviewed and SLP is in agreement.  Jesusmanuel Erbes, M.A., CCC-SLP  [phone]   

## 2013-11-10 NOTE — Progress Notes (Deleted)
Hypoglycemic Event  CBG: 60 at 0735  Treatment: 15 GM carbohydrate snack, eating breakfast  Symptoms: None  Follow-up CBG: Time:0820 CBG Result: 179  Possible Reasons for Event: Unknown  Comments/MD notified:Dr. Salomon Mast  Remember to initiate Hypoglycemia Order Set & complete

## 2013-11-10 NOTE — Progress Notes (Signed)
Occupational Therapy Session Note  Patient Details  Name: Riley Stuart MRN: [id number] Date of Birth: Oct 26, 1950  Today's Date: 11/10/2013  Session 1 Time: 1100-1155 Time Calculation (min): 55 min  Short Term Goals: Week 1:  OT Short Term Goal 1 (Week 1): Groom:  Patient will stand with Supervision for at least 3 grooming tasks without a seated rest break OT Short Term Goal 2 (Week 1): Dressing:  Patient will ambulate to gather clothes to prepare for self tasks. OT Short Term Goal 3 (Week 1): Toileting:  Supervision with 3/3 hygiene tasks in sit and stand. OT Short Term Goal 4 (Week 1): Toilet Transfer:  Supervision for toilet transfer  Skilled Therapeutic Interventions/Progress Updates:    Pt engaged in bathing and dressing tasks w/c level at sink. Pt amb with RW from bed to w/c at sink.  Pt completed all tasks requiring assistance only to bathe feet because patient declined to bathe feet.  Pt stated he took a shower every day at his group home but wasn't sure if he wanted to take shower tomorrow. Pt engaged in laundry tasks prior to returning to room.  Focus on activity tolerance, safety awareness, dynamic standing balance, and functional amb with RW.  Therapy Documentation Precautions:  Precautions Precautions: Fall Precaution Comments: pt with known psychosis, declines use of a belt or lap tray for restraint. Restrictions Weight Bearing Restrictions: No Pain: Pain Assessment Pain Assessment: No/denies pain  See FIM for current functional status  Therapy/Group: Individual Therapy  Session 2 Time: 1330-1400 Pt denies pain Individual Therapy  Pt asleep in bed upon arrival but aroused with min multimodal cues.  Pt agreeable to checking on laundry and removing clothes from washer to transfer to dryer.  Pt required max A for supine->sit EOB and place BLE on floor.  Pt performed stand pivot transfer to w/c with min A.  Once in laundry room patient performed sit<>stand  with supervision and transferred clothing to dryer.  Upon return to room patient stood at sink to wash hands and perform oral care before transferring to recliner.  Pt remained in recliner with all needs in place.  [name] [facility] 11/10/2013, 11:59 AM

## 2013-11-11 ENCOUNTER — Inpatient Hospital Stay (HOSPITAL_COMMUNITY): Payer: Medicare Other | Admitting: Speech Pathology

## 2013-11-11 ENCOUNTER — Encounter (HOSPITAL_COMMUNITY): Payer: Self-pay

## 2013-11-11 ENCOUNTER — Inpatient Hospital Stay (HOSPITAL_COMMUNITY): Payer: Self-pay

## 2013-11-11 DIAGNOSIS — N189 Chronic kidney disease, unspecified: Secondary | ICD-10-CM

## 2013-11-11 DIAGNOSIS — E119 Type 2 diabetes mellitus without complications: Secondary | ICD-10-CM

## 2013-11-11 DIAGNOSIS — I10 Essential (primary) hypertension: Secondary | ICD-10-CM

## 2013-11-11 DIAGNOSIS — I609 Nontraumatic subarachnoid hemorrhage, unspecified: Secondary | ICD-10-CM

## 2013-11-11 LAB — CULTURE, BLOOD (ROUTINE X 2)
CULTURE: NO GROWTH
Culture: NO GROWTH

## 2013-11-11 LAB — GLUCOSE, CAPILLARY
GLUCOSE-CAPILLARY: 136 mg/dL — AB (ref 70–99)
Glucose-Capillary: 143 mg/dL — ABNORMAL HIGH (ref 70–99)
Glucose-Capillary: 137 mg/dL — ABNORMAL HIGH (ref 70–99)
Glucose-Capillary: 174 mg/dL — ABNORMAL HIGH (ref 70–99)

## 2013-11-11 NOTE — Progress Notes (Signed)
The skilled treatment note has been reviewed and SLP is in agreement.  Genevie Elman, M.A., CCC-SLP  [phone]   

## 2013-11-11 NOTE — Progress Notes (Signed)
Occupational Therapy Session Note  Patient Details  Name: Riley Stuart MRN: [id number] Date of Birth: [birth year]  Today's Date: 11/11/2013  Session 1 Time: 0800-0900 Time Calculation (min): 60 min  Short Term Goals: Week 1:  OT Short Term Goal 1 (Week 1): Groom:  Patient will stand with Supervision for at least 3 grooming tasks without a seated rest break OT Short Term Goal 2 (Week 1): Dressing:  Patient will ambulate to gather clothes to prepare for self tasks. OT Short Term Goal 3 (Week 1): Toileting:  Supervision with 3/3 hygiene tasks in sit and stand. OT Short Term Goal 4 (Week 1): Toilet Transfer:  Supervision for toilet transfer  Skilled Therapeutic Interventions/Progress Updates:    Pt resting in recliner upon arrival and agreeable to bathing at sink with sit<>stand from w/c for LB bathing and dressing.  Pt declined shower this morning.  Pt completed all tasks requiring min A only for bathing feet. Pt very deliberate in initiating and completing tasks.  Focus on activity tolerance, functional amb with RW, dynamic standing balance, and safety awareness.  Therapy Documentation Precautions:  Precautions Precautions: Fall Precaution Comments: pt with known psychosis, declines use of a belt or lap tray for restraint. Restrictions Weight Bearing Restrictions: No Pain: Pain Assessment Pain Assessment: No/denies pain  See FIM for current functional status  Therapy/Group: Individual Therapy  Session 2 Time: [id number] Pt denies pain Individual therapy  Pt asleep in bed upon arrival but easily aroused.  Pt sat EOB to don clean pants.  Pt amb with RW to bathroom to use toilet but didn't stand close enough to toilet and voided partially on floor.  Pt completed cleaning up after "accident" and returned to w/c to finish dressing. Pt transitioned to tub room to discuss bathing arrangements at group home.  Pt stated that he doesn't think he needs to sit on seat to bathing but  refused to practice tub transfers.  Discussed taking shower in the morning and patient stated that he would have to make a decision in the morning.  Pt has a very clear idea of what he wants to do and what he will do. Pt returned to room and amb with RW to relciner.  Pt remained in recliner with all needs in place and chair alarm on.  [name] [facility] 11/11/2013, 9:01 AM

## 2013-11-11 NOTE — Progress Notes (Signed)
Subjective/Complaints:  Had a good night. Happy with his progress. A little impulsive still by reports, mood sometimes labile A 12 point review of systems has been performed and if not noted above is otherwise negative.  Objective: Vital Signs: Blood pressure 112/67, pulse 65, temperature 98.4 F (36.9 C), temperature source Oral, resp. rate 18, weight 113.6 kg (250 lb 7.1 oz), SpO2 98.00%. No results found.  Recent Labs  11/09/13 0612  WBC 9.3  HGB 12.0*  HCT 33.7*  PLT 400    Recent Labs  11/09/13 0612  NA 142  K 4.0  CL 106  GLUCOSE 135*  BUN 20  CREATININE 1.51*  CALCIUM 9.7   CBG (last 3)   Recent Labs  11/10/13 1653 11/10/13 2100 11/11/13 0723  GLUCAP 171* 142* 143*    Wt Readings from Last 3 Encounters:  11/11/13 113.6 kg (250 lb 7.1 oz)  10/26/13 122.1 kg (269 lb 2.9 oz)  10/26/13 122.1 kg (269 lb 2.9 oz)    Physical Exam:  Constitutional: He is oriented to person, place, and time. He appears well-developed and well-nourished.  Polite and appropriate.  HENT:  Head: Normocephalic.  Left crani incision with staples intact.  Eyes: Conjunctivae are normal. Pupils are equal, round, and reactive to light.  Neck: Normal range of motion.  Cardiovascular: Normal rate and regular rhythm.  No murmur heard.  Respiratory: Effort normal and breath sounds normal. No respiratory distress. No wheezes  GI: Soft. Bowel sounds are normal. He exhibits no distension. There is no tenderness.  Musculoskeletal: He exhibits no edema.  Well healed old scars on left knee and left ankle.  Neurological: He is alert and oriented to person, place, and time.    Tracks to all visual fields. Speech clearer. Follows all simple commands. Has intact basic insight and awareness.  Oriented to day, date, place. Strength RUE 4/5 bic,tri,delt, hand/wrist. LUE 4+ prox to distal. RLE 3/5 HF, 3+ KE and ankle. LLE is 3+ HF, 4/5 knee, 4/5 ankle. No gross sensory deficits.  Skin:  Skin is warm and dry.  Psychiatric:  Calm, Pleasant.    Assessment/Plan: 1. Functional deficits secondary to [facility] which require 3+ hours per day of interdisciplinary therapy in a comprehensive inpatient rehab setting. Physiatrist is providing close team supervision and 24 hour management of active medical problems listed below. Physiatrist and rehab team continue to assess barriers to discharge/monitor patient progress toward functional and medical goals. FIM: FIM - Bathing Bathing Steps Patient Completed: Chest;Right Arm;Left Arm;Abdomen;Front perineal area;Buttocks;Right upper leg;Left upper leg Bathing: 4: Min-Patient completes 8-9 54f 10 parts or 75+ percent  FIM - Upper Body Dressing/Undressing Upper body dressing/undressing steps patient completed: Thread/unthread right sleeve of pullover shirt/dresss;Thread/unthread left sleeve of pullover shirt/dress;Put head through opening of pull over shirt/dress;Pull shirt over trunk Upper body dressing/undressing: 5: Set-up assist to: Obtain clothing/put away FIM - Lower Body Dressing/Undressing Lower body dressing/undressing steps patient completed: Thread/unthread right underwear leg;Thread/unthread left underwear leg;Pull underwear up/down;Thread/unthread right pants leg;Thread/unthread left pants leg;Pull pants up/down;Don/Doff right sock;Don/Doff left sock;Don/Doff right shoe;Don/Doff left shoe Lower body dressing/undressing: 5: Supervision: Safety issues/verbal cues  FIM - Toileting Toileting steps completed by patient: Adjust clothing prior to toileting;Performs perineal hygiene;Adjust clothing after toileting Toileting Assistive Devices: Grab bar or rail for support Toileting: 3: Mod-Patient completed 2 of 3 steps  FIM - Radio producer Devices: Insurance account manager Transfers: 4-To toilet/BSC: Min A (steadying Pt. > 75%);4-From toilet/BSC: Min A (steadying  Pt. > 75%)  FIM - Bed/Chair Transfer Bed/Chair Transfer  Assistive Devices: Copy: 5: Supine > Sit: Supervision (verbal cues/safety issues);5: Sit > Supine: Supervision (verbal cues/safety issues);4: Bed > Chair or W/C: Min A (steadying Pt. > 75%);4: Chair or W/C > Bed: Min A (steadying Pt. > 75%)  FIM - Locomotion: Wheelchair Distance: 70 Locomotion: Wheelchair: 2: Travels 50 - 149 ft with supervision, cueing or coaxing FIM - Locomotion: Ambulation Locomotion: Ambulation Assistive Devices: Administrator Ambulation/Gait Assistance: 4: Min assist Locomotion: Ambulation: 1: Travels less than 50 ft with minimal assistance (Pt.>75%)  Comprehension Comprehension Mode: Auditory Comprehension: 5-Understands basic 90% of the time/requires cueing < 10% of the time  Expression Expression Mode: Verbal Expression: 3-Expresses basic 50 - 74% of the time/requires cueing 25 - 50% of the time. Needs to repeat parts of sentences.  Social Interaction Social Interaction: 4-Interacts appropriately 75 - 89% of the time - Needs redirection for appropriate language or to initiate interaction.  Problem Solving Problem Solving: 3-Solves basic 50 - 74% of the time/requires cueing 25 - 49% of the time  Memory Memory: 4-Recognizes or recalls 75 - 89% of the time/requires cueing 10 - 24% of the time  Medical Problem List and Plan:  SAH d/t A-com aneurysm --remove staples today 1. DVT Prophylaxis/Anticoagulation: Pharmaceutical: Lovenox  2. Pain Management: continue hydrocodone prn for HA and leg pain---generally controlled 3. Mood: No signs of distress or agitation. LCSW to follow for evaluation.  4. Neuropsych: This patient is not capable of making decisions on his own behalf.  5. FUO: will continue to monitor. Improved but urine positive for E coli---begin cipro for 5 days  6. Schizoaffective disorder: resume lamictal and geodon at home doses. 7. DM type 2: Used 70/30 insulin at home--50 u/am and 35 u/pm. On SSI at this time. resumed low  dose 70/30 insulin 5u bid with good results so far  8. CKD: Renal status normal at this time. -off Diovan/HCTZ currently.   -CR 1.5  9. HTN: Monitor with bid checks. Resumed metoprolol at lower dose and titrate up as indicated--monitor HR. Off Diovan/HCTZ at this time.   LOS (Days) 5 A FACE TO FACE EVALUATION WAS PERFORMED  Riley Stuart T 11/11/2013 7:52 AM

## 2013-11-11 NOTE — Progress Notes (Signed)
[facility] Individual Statement of Services  Patient Name:  Riley Stuart  Date:  11/09/2013  Welcome to the [facility].  Our goal is to provide you with an individualized program based on your diagnosis and situation, designed to meet your specific needs.  With this comprehensive rehabilitation program, you will be expected to participate in at least 3 hours of rehabilitation therapies Monday-Friday, with modified therapy programming on the weekends.  Your rehabilitation program will include the following services:  Physical Therapy (PT), Occupational Therapy (OT), Speech Therapy (ST), 24 hour per day rehabilitation nursing, Therapeutic Recreaction (TR), Case Management (Social Worker), Rehabilitation Medicine, Nutrition Services and Pharmacy Services  Weekly team conferences will be held on Tuesdays to discuss your progress.  Your Social Worker will talk with you frequently to get your input and to update you on team discussions.  Team conferences with you and your family in attendance may also be held.  Expected length of stay: 7 days  Overall anticipated outcome: supervision  Depending on your progress and recovery, your program may change. Your Social Worker will coordinate services and will keep you informed of any changes. Your Social Worker's name and contact numbers are listed  below.  The following services may also be recommended but are not provided by the [facility]:    [place]are will be made to provide these services after discharge if needed.  Arrangements include referral to agencies that provide these services.  Your insurance has been verified to be:  Medicare and Medicaid Your primary doctor is:  Dr. Jaquelyn Bitter  Pertinent information will be shared with your doctor and your insurance company.  Social Worker:  [place], [place] or ([phone]   Information discussed with and copy given to patient by: Lennart Pall, 11/09/2013, 6:45 PM

## 2013-11-11 NOTE — Patient Care Conference (Signed)
Inpatient RehabilitationTeam Conference and Plan of Care Update Date: 11/10/2013   Time: 2:50 PM    Patient Name: Riley Stuart      Medical Record Number: [id number]  Date of Birth: 08-12-1951 Sex: Male         Room/Bed: 4W08C/4W08C-01 Payor Info: Payor: MEDICARE / Plan: MEDICARE PART A AND B / Product Type: *No Product type* /    Admitting Diagnosis: sah s/p crani  Admit Date/Time:  11/06/2013  3:57 PM Admission Comments: No comment available   Primary Diagnosis:  <principal problem not specified> Principal Problem: <principal problem not specified>  Patient Active Problem List   Diagnosis Date Noted  . 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 SAH (subarachnoid hemorrhage) 10/26/2013    Expected Discharge Date: Expected Discharge Date: 11/13/13  Team Members Present: Physician leading conference: Dr. Alger Simons Social Worker Present: Lennart Pall, LCSW Nurse Present: Heather Roberts, RN PT Present: Otis Brace, PT OT Present: Forde Radon, OT;Roanna Epley, COTA SLP Present: Weston Anna, SLP PPS Coordinator present : Daiva Nakayama, RN, CRRN;Becky Alwyn Ren, PT     Current Status/Progress Goal Weekly Team Focus  Medical   aneurysm, left [facility], s/p clip/evac  improve balance, safety  pain mgt, improving awarenes, safety   Bowel/Bladder   cont. of bowel,acn be incont. of urine  regular bm q 1-2 days,fewer bladder incont. episodes  toilet pt q 3 hrs,after meals and @ Hs   Swallow/Nutrition/ Hydration             ADL's   min A overall  supervision overall  activity tolerance, standing balance, transfers, safety awareness   Mobility   Supervision for w/c propulsion and transfers, close(S)-min guard A for ambulation up to 25'  Supervision for standing household mobility  B LE strength, functional endurance, safety    Communication             Safety/Cognition/ Behavioral Observations  currently Min assist to complete basic problem solving. Needs Mod assist to  demonstrate use call bell  supervision for cognition  use of call bell to call for help   Pain   managed w/ prn meds  pain relief  assess for pain,remind pt to alert staff for needs   Skin   staples to L crani,site,clean,pink,dry  healing w/o complications  assess daily    Rehab Goals Patient on target to meet rehab goals: Yes *See Care Plan and progress notes for long and short-term goals.  Barriers to Discharge: baseline cognitive status    Possible Resolutions to Barriers:  supervision at dc    Discharge Planning/Teaching Needs:  return to group home as long as he can reach supervision level      Team Discussion:  New eval - plans to return to group home as long as he reaches supervision goals.  Sometimes resistant to staff touching him and can become easily upset.    Revisions to Treatment Plan:  None   Continued Need for Acute Rehabilitation Level of Care: The patient requires daily medical management by a physician with specialized training in physical medicine and rehabilitation for the following conditions: Daily direction of a multidisciplinary physical rehabilitation program to ensure safe treatment while eliciting the highest outcome that is of practical value to the patient.: Yes Daily medical management of patient stability for increased activity during participation in an intensive rehabilitation regime.: Yes Daily analysis of laboratory values and/or radiology reports with any subsequent need for medication adjustment of medical intervention for : Neurological problems;Post surgical problems  Kieran Arreguin 11/11/2013, 3:50 PM

## 2013-11-11 NOTE — Progress Notes (Signed)
Speech Language Pathology Daily Session Note  Patient Details  Name: Riley Stuart MRN: [id number] Date of Birth: Aug 17, 1951  Today's Date: 11/11/2013 Time: 1415-1440 Time Calculation (min): 25 min  Short Term Goals: Week 1: SLP Short Term Goal 1 (Week 1): Pt will utilize external memory aids to recall new, daily information with supervision verbal and question cues.  SLP Short Term Goal 2 (Week 1): Pt will utilize call bell to request assistance with supervision question cues.  SLP Short Term Goal 3 (Week 1): Pt will demonstrate functional problem solving for basic and familiar tasks with supervision verbal cues.   Skilled Therapeutic Interventions: Skilled therapeutic session addressed cognitive goals. Student facilitated session by providing Mod verbal cues for expanding descriptions during a structured verbal expression task. Patient required Min verbal cues to demonstrate use of call bell. Continue with current plan of care.    FIM:  Comprehension Comprehension Mode: Auditory Comprehension: 5-Understands basic 90% of the time/requires cueing < 10% of the time Expression Expression Mode: Verbal Expression: 4-Expresses basic 75 - 89% of the time/requires cueing 10 - 24% of the time. Needs helper to occlude trach/needs to repeat words. Social Interaction Social Interaction: 5-Interacts appropriately 90% of the time - Needs monitoring or encouragement for participation or interaction. Problem Solving Problem Solving: 3-Solves basic 50 - 74% of the time/requires cueing 25 - 49% of the time Memory Memory: 4-Recognizes or recalls 75 - 89% of the time/requires cueing 10 - 24% of the time FIM - Eating Eating Activity: 0: Activity did not occur  Pain Pain Assessment Pain Assessment: No/denies pain  Therapy/Group: Individual Therapy  Emily Massar 11/11/2013, 4:01 PM

## 2013-11-11 NOTE — Progress Notes (Signed)
Physical Therapy Session Note  Patient Details  Name: Riley Stuart MRN: [id number] Date of Birth: Mar 22, 1951  Today's Date: 11/11/2013 Time: 0930-1010 Time Calculation (min): 40 min  Short Term Goals: Week 1:  PT Short Term Goal 1 (Week 1): Pt will be able to transfer from various level surfaces/heights to promote OOB activity at supervision level to promote increased functional independence.  PT Short Term Goal 2 (Week 1): Pt will be able to ambulate 25-50 feet using the LRAD with stand by assist to promote functional mobility within the home.  PT Short Term Goal 3 (Week 1): Pt will be able to tolerate 10-15 reps of lower extremity ther ex to promote increased strength and ROM for improved functional mobility and endurance.  PT Short Term Goal 4 (Week 1): Pt will be able to propel wheel chair 100 feet  on level surfaces at supervision level in order to promote functional mobility within the home and community, allowing pt to attend meals in the dinning area.  Skilled Therapeutic Interventions/Progress Updates:  1:1. Pt received sitting in recliner, ready for therapy. Focus this session on functional mobility in therapy apartment, ambulation, w/c propulsion and car t/f. Pt req overall (S) w/ intermittent min cueing for safe management of RW during mobility in therapy apartment including bed mobility and furniture transfers. Pt able to amb 20'x2 and 50' this session on various surfaces, however, continues to demonstrate short shuffled steps despite cues to take larger steps. Pt declined to try stair negotiation due to fear of falling despite encouragement regarding safe environment and assistance from therapist. Pt req min cues for safe completion of car transfer w/ supervision. With increased time, pt able to propel w/c x100' w/ B UE and supervision. Pt sitting in recliner at end of session w/ all needs in reach, chair alarm on and nurse in room.   Therapy Documentation Precautions:   Precautions Precautions: Fall Precaution Comments: pt with known psychosis, declines use of a belt or lap tray for restraint. Restrictions Weight Bearing Restrictions: No   Pain: Pain Assessment Pain Assessment: No/denies pain  See FIM for current functional status  Therapy/Group: Individual Therapy  Gilmore Laroche 11/11/2013, 10:10 AM

## 2013-11-11 NOTE — Progress Notes (Signed)
Social Work  Social Work Assessment and Plan  Patient Details  Name: Riley Stuart MRN: [id number] Date of Birth: 07/09/1951  Today's Date: 11/09/2013  Problem List:  Patient Active Problem List   Diagnosis Date Noted  . 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 SAH (subarachnoid hemorrhage) 10/26/2013   Past Medical History:  Past Medical History  Diagnosis Date  . Diabetes mellitus without complication   . Mental disorder     schizoaffective  . Hypertension   . Chronic kidney disease     renal insufficiency   Past Surgical History:  Past Surgical History  Procedure Laterality Date  . Ankle fracture surgery    . Knee surgery      due to fracture.   . Craniotomy Left 10/27/2013    Procedure: Craniotomy for Aneurysm Clipping;  Surgeon: Consuella Lose, MD;  Location: [facility];  Service: Neurosurgery;  Laterality: Left;   Social History:  reports that he has been smoking.  He does not have any smokeless tobacco history on file. His alcohol and drug histories are not on file.  Family / Support Systems Marital Status: Single Other Supports: brother, Shadd Legnon @ [phone];  group home staff:  Annitta Needs @ [phone] and Ms. Festus Holts @ [phone] Anticipated Caregiver: STaff Memeber at [facility] who can provide 24/7 supervision level Ability/Limitations of Caregiver: needs to be at supervision level to return to [facility]. They can do onsite assessment of pt's functional level prior to d/c Caregiver Availability: 24/7 Family Dynamics: brother very supporitve of pt and wishes to be kept informed of his progress  Social History Preferred language: English Religion: Unknown Cultural Background: NA Education: limited due to mental health Employment Status: Disabled Guardian/Conservator: brother, Jenny Reichmann, is pt's primary guardian   Abuse/Neglect Physical Abuse: Denies Verbal Abuse: Denies Sexual Abuse: Denies Exploitation of patient/patient's resources:  Denies Self-Neglect: Denies  Emotional Status Pt's affect, behavior adn adjustment status: Pt speaks with very measaured, matter-of-fact tone and answers questions with very direct and brief answers.  Denies any emotional distress currently.  Staff does report that he becomes a little upset/ agitiated with touch.   Recent Psychosocial Issues: None Pyschiatric History: pt suffers from schizoaffective disorder and has lived in current group home x 18 yrs. Substance Abuse History: noen  Patient / Family Perceptions, Expectations & Goals Pt/Family understanding of illness & functional limitations: Pt states, "I had blood on my brain and they had to take it out."  Appears to have a very basic understanding of medical events, surgery performed and reasoning for CIR.  Brother and group home staff with good understanding of medical issues and of current functional limitaitons. Premorbid pt/family roles/activities: pt was living in a group home (5 other residents) and was independent with his basic self care needs. Anticipated changes in roles/activities/participation: little change anticipated if he reaches the projeccted supervision goals Pt/family expectations/goals: group home staff hopeful he will reach S goals and be able to return there  [employer]: Other (Comment) (Mental Health services and DSS) Premorbid Home Care/DME Agencies: None Transportation available at discharge: yes  Discharge Planning Living Arrangements: Other (Comment) (Lived at Group home for about 15 years; [employer]) Support Systems: Other relatives;Home care staff Type of Residence: Group home Insurance Resources: Medicare;Medicaid (specify county) Financial Resources: Aeronautical engineer Screen Referred: No Living Expenses: Other (Comment) (group home) Money Management: Family Does the patient have any problems obtaining your medications?: No Home Management: group home staff Patient/Family  Preliminary Plans: plans are for pt to return to group home if he reaches supervision goals Social Work Anticipated Follow Up Needs: HH/OP (@ group home) Expected length of stay: ELOS 10-14 days  Clinical Impression Unfortunate gentleman here following aneurysm rupture and will known schizoaffective d/c.  Lives in a group home in [place] and has a supportive brother living locally.  Plan is for pt to return to group home as long as he is reaching supervision goals.  Sheilyn Boehlke 11/09/2013, 6:43 PM

## 2013-11-12 ENCOUNTER — Ambulatory Visit (HOSPITAL_COMMUNITY): Payer: Self-pay

## 2013-11-12 ENCOUNTER — Inpatient Hospital Stay (HOSPITAL_COMMUNITY): Payer: Self-pay

## 2013-11-12 ENCOUNTER — Inpatient Hospital Stay (HOSPITAL_COMMUNITY): Payer: Medicare Other

## 2013-11-12 ENCOUNTER — Encounter (HOSPITAL_COMMUNITY): Payer: Self-pay

## 2013-11-12 LAB — GLUCOSE, CAPILLARY
GLUCOSE-CAPILLARY: 133 mg/dL — AB (ref 70–99)
GLUCOSE-CAPILLARY: 169 mg/dL — AB (ref 70–99)
Glucose-Capillary: 158 mg/dL — ABNORMAL HIGH (ref 70–99)
Glucose-Capillary: 210 mg/dL — ABNORMAL HIGH (ref 70–99)

## 2013-11-12 NOTE — Discharge Instructions (Signed)
Inpatient Rehab Discharge Instructions  Riley Stuart Discharge date and time:    Activities/Precautions/ Functional Status: Activity: activity as tolerated Diet: diabetic diet Wound Care: keep wound clean and dry Functional status:  ___ No restrictions     ___ Walk up steps independently _X__ 24/7 supervision/assistance   ___ Walk up steps with assistance ___ Intermittent supervision/assistance  ___ Bathe/dress independently ___ Walk with walker     ___ Bathe/dress with assistance ___ Walk Independently    ___ Shower independently ___ Walk with assistance    ___ Shower with assistance _X__ No alcohol     ___ Return to work/school ________    COMMUNITY REFERRALS UPON DISCHARGE:    Home Health:   PT                     Agency: [facility] Phone: [phone]   Medical Equipment/Items Ordered: rolling walker                                                     Agency/Supplier: [facility] @ [phone]     Special Instructions:    My questions have been answered and I understand these instructions. I will adhere to these goals and the provided educational materials after my discharge from the hospital.  Patient/Caregiver Signature _______________________________ Date __________  Clinician Signature _______________________________________ Date __________  Please bring this form and your medication list with you to all your follow-up doctor's appointments.

## 2013-11-12 NOTE — Progress Notes (Signed)
Occupational Therapy Session Note  Patient Details  Name: Riley Stuart MRN: [id number] Date of Birth: [birth year]  Today's Date: 11/12/2013 Time: 0800-0855 Time Calculation (min): 55 min  Short Term Goals: Week 1:  OT Short Term Goal 1 (Week 1): Groom:  Patient will stand with Supervision for at least 3 grooming tasks without a seated rest break OT Short Term Goal 2 (Week 1): Dressing:  Patient will ambulate to gather clothes to prepare for self tasks. OT Short Term Goal 3 (Week 1): Toileting:  Supervision with 3/3 hygiene tasks in sit and stand. OT Short Term Goal 4 (Week 1): Toilet Transfer:  Supervision for toilet transfer  Skilled Therapeutic Interventions/Progress Updates:    Pt resting in recliner upon arrival.  Attempted to engage patient in bathing at shower level.  Pt was insistent that he didn't want to take shower unitl such time as he returned to his group home. Pt gathered clothing and completed bathing and dressing tasks with sit<>stand from sink.  Pt requested to use bathroom and amb with RW into bathroom to use toilet.  Pt completed all tasks at mod I level.   Therapy Documentation Precautions:  Precautions Precautions: Fall Precaution Comments: pt with known psychosis, declines use of a belt or lap tray for restraint. Restrictions Weight Bearing Restrictions: No See FIM for current functional status  Therapy/Group: Individual Therapy  Leroy Libman 11/12/2013, 9:19 AM

## 2013-11-12 NOTE — Progress Notes (Signed)
Social Work Patient ID: Riley Stuart, male   DOB: [birth year], 63 y.o.   MRN: [id number]  Have spoken with pt, his brother and with [place] manager, Annitta Needs to review team conference.  All aware and agreed on targeted d/c date of 3/20 and to be in this morning to observe a PT session.  No concerns at this point.  Will arrange any follow up at group home as recommended.  Jefferey Lippmann, LCSW

## 2013-11-12 NOTE — Plan of Care (Signed)
Problem: RH SAFETY Goal: RH STG DECREASED RISK OF FALL WITH ASSISTANCE STG Decreased Risk of Fall With mod I  Outcome: Not Progressing Forgetful,gets up w/o staff assist.bed alrm and chair alarm in use, toilet q 3 hrs ans after meals and Hs

## 2013-11-12 NOTE — Progress Notes (Signed)
Occupational Therapy Discharge Summary  Patient Details  Name: Riley Stuart MRN: [id number] Date of Birth: 20-Jul-1951  Today's Date: 11/12/2013  Patient has met 9 of 9 long term goals due to improved problem solving, memory, improved activity tolerance, improved balance, increased strength, ability to compensate for deficits, improved attention, improved awareness and improved coordination.  Patient made steady progress with bathing, dressing, toilet transfers and toileting during this admission.  Pt declined on numerous times to take a shower but demonstrated stepping over ledge to access simulated shower stall requiring only supervision.  Pt requires extra time to transition between tasks and task segments.  Pt is slow and deliberate with all movements, tasks, and transitions.  Pt acknowledges that he is not as "strong" as before and he requires more rest breaks.  Staff from his group home have been present to observe patient and stated that he is at an appropriate level to return to the group home. Patient to discharge at overall Supervision level.  Staff from patient's group home have been present to observe patient and stated that he is at an appropriate level to return to the group home. Staff verbalize ability to provide pt with supervision as needed upon discharge   Recommendation:  No OT follow up recommended at this time Equipment: No equipment provided Shower seat available at group home  Reasons for discharge: treatment goals met and discharge from hospital  Patient/family agrees with progress made and goals achieved: Yes  OT Discharge Precautions/Restrictions  Precautions Precautions: Fall Restrictions Weight Bearing Restrictions: No   ADL  See FIM  Vision/Perception  Vision - History Baseline Vision: Wears glasses only for reading Patient Visual Report: No change from baseline Vision - Assessment Eye Alignment: Within Functional Limits Vision Assessment:  Vision not tested Ocular Range of Motion: Within Functional Limits Tracking/Visual Pursuits: Able to track stimulus in all quads without difficulty Visual Fields: No apparent deficits Perception Perception: Within Functional Limits Praxis Praxis: Intact  Cognition Overall Cognitive Status: History of cognitive impairments - at baseline Arousal/Alertness: Awake/alert Orientation Level: Oriented to person;Oriented to place;Oriented to situation Attention: Sustained Sustained Attention: Appears intact Memory: Impaired Memory Impairment: Decreased recall of new information Awareness: Impaired Awareness Impairment: Emergent impairment Problem Solving: Impaired Problem Solving Impairment: Functional basic Safety/Judgment: Impaired Sensation Sensation Light Touch: Impaired by gross assessment Stereognosis: Not tested Hot/Cold: Appears Intact Proprioception: Not tested Coordination Gross Motor Movements are Fluid and Coordinated: Yes Fine Motor Movements are Fluid and Coordinated: Yes Motor  Motor Motor: Other (comment) Motor - Discharge Observations: generalized weakness    Trunk/Postural Assessment  Cervical Assessment Cervical Assessment: Within Functional Limits Thoracic Assessment Thoracic Assessment: Exceptions to WFL (slight kyphotic posture) Lumbar Assessment Lumbar Assessment: Within Functional Limits Postural Control Postural Control: Within Functional Limits  Balance Static Sitting Balance Static Sitting - Balance Support: Feet supported Static Sitting - Level of Assistance: 7: Independent Dynamic Sitting Balance Dynamic Sitting - Balance Support: Feet supported Dynamic Sitting - Level of Assistance: 6: Modified independent (Device/Increase time) Extremity/Trunk Assessment RUE Assessment RUE Assessment: Within Functional Limits LUE Assessment LUE Assessment: Within Functional Limits  See FIM for current functional status  Leroy Libman 11/12/2013, 1:50 PM

## 2013-11-12 NOTE — Progress Notes (Signed)
Speech Language Pathology Daily Session Note  Patient Details  Name: Riley Stuart MRN: [id number] Date of Birth: [birth year]  Today's Date: 11/12/2013 Time: 1415-1500 Time Calculation (min): 45 min  Short Term Goals: Week 1: SLP Short Term Goal 1 (Week 1): Pt will utilize external memory aids to recall new, daily information with supervision verbal and question cues.  SLP Short Term Goal 2 (Week 1): Pt will utilize call bell to request assistance with supervision question cues.  SLP Short Term Goal 3 (Week 1): Pt will demonstrate functional problem solving for basic and familiar tasks with supervision verbal cues.   Skilled Therapeutic Interventions: Skilled treatment focused on cognitive goals. SLP facilitated session by providing supervision level verbal cues for expanding descriptions during a structured task. Pt recalled daily events with Min question cues. Continue with current plan of care.   FIM:  Comprehension Comprehension Mode: Auditory Comprehension: 5-Understands basic 90% of the time/requires cueing < 10% of the time Expression Expression Mode: Verbal Expression: 5-Expresses basic 90% of the time/requires cueing < 10% of the time. Social Interaction Social Interaction: 5-Interacts appropriately 90% of the time - Needs monitoring or encouragement for participation or interaction. Problem Solving Problem Solving: 3-Solves basic 50 - 74% of the time/requires cueing 25 - 49% of the time Memory Memory: 3-Recognizes or recalls 50 - 74% of the time/requires cueing 25 - 49% of the time FIM - Eating Eating Activity: 7: Complete independence:no helper  Pain Pain Assessment Pain Assessment: No/denies pain  Therapy/Group: Individual Therapy   Riley Stuart, M.A. CCC-SLP [phone]  Riley Stuart 11/12/2013, 5:19 PM

## 2013-11-12 NOTE — Progress Notes (Signed)
Physical Therapy Discharge Summary  Patient Details  Name: Riley Stuart MRN: [id number] Date of Birth: 1951-08-10  Today's Date: 11/12/2013 Time: Treatment Session 1: 1000-1050; Treatment Session 2: 1315-1340 Time Calculation (min): Treatment Session 1: 50 min; Treatment Session 2: 38mn  Patient has met 8 of 8 long term goals due to improved activity tolerance, improved balance, increased strength, ability to compensate for deficits, improved attention, improved awareness and improved coordination.  Patient to discharge at an ambulatory level mod(I) in home enviornment, but benefits from (S) in community environments. Staff from his group home have been present to observe patient and stated that he is at an appropriate level to return to the group home. Staff verbalize ability to provide pt with supervision as needed upon discharge.  Reasons goals not met: N/A  Recommendation:  Patient will benefit from ongoing skilled PT services in home health setting to continue to advance safe functional mobility, address ongoing impairments in decreased functional endurance, decreased strength, decreased balance, decreased coordination, gait pattern, and minimize fall risk.  Equipment: RConservation officer, nature Reasons for discharge: treatment goals met and discharge from hospital  Patient/family agrees with progress made and goals achieved: Yes  Skilled Therapeutic Intervention Treatment Session 1: 1:1. Pt received sitting in recliner, ready for therapy. Focus this session on training w/ staff from pt's group home. Pt able to demonstrate bed mobility, furniture transfers and ambulation in controlled/home environments at mod(I) level. Emphasized need for supervision when pt ambulating in busy environments w/in group home as well as in community environments. Therapist verbally educating pt and staff on completion of safe car transfer, importance/safe management of RW, keeping urinals near bed at night to  prevent incontinence and goals of HHPT. OT briefly present during session to encourage use of more accessible shower with seat in group home (vs. pt's personal shower) for improved safety during bathing. Both pt and staff verbalized understanding of all education this session. Pt missed 169m at end of session due to fatigue. Pt sitting in recliner at end of session w/ all needs in reach, chair alarm on and group home staff in room.   Treatment Session 2: 1:1. Pt received sitting in recliner, ready for therapy. Pt instructed on use of walker bag for improved safe management of items in home environment when using RW, reviewed [facility] and importance of using RW for safety and to decrease risk for falls. Pt verbalized understanding. Pt propelling w/c x150' w/ B UE and increased time to target functional strength/endurance. Pt sitting in recliner at end of session w/ all needs in reach, bed alarm on.    PT Discharge Precautions/Restrictions Precautions Precautions: Fall Restrictions Weight Bearing Restrictions: No Vital Signs Therapy Vitals Temp: 98 F (36.7 C) Temp src: Oral Pulse Rate: 60 Resp: 20 BP: 145/74 mmHg Patient Position, if appropriate: Sitting Oxygen Therapy SpO2: 97 % O2 Device: None (Room air) Pain   Vision/Perception  Vision - History Baseline Vision: Wears glasses only for reading Patient Visual Report: No change from baseline Vision - Assessment Eye Alignment: Within Functional Limits Vision Assessment: Vision not tested Ocular Range of Motion: Within Functional Limits Tracking/Visual Pursuits: Able to track stimulus in all quads without difficulty Visual Fields: No apparent deficits Perception Perception: Within Functional Limits Praxis Praxis: Intact  Cognition Overall Cognitive Status: History of cognitive impairments - at baseline Arousal/Alertness: Awake/alert Orientation Level: Oriented to person;Oriented to place;Oriented to  situation Attention: Sustained Sustained Attention: Appears intact Memory: Impaired Memory Impairment: Decreased recall of  new information Awareness: Impaired Awareness Impairment: Emergent impairment Problem Solving: Impaired Problem Solving Impairment: Functional basic Safety/Judgment: Impaired Sensation Sensation Light Touch: Impaired by gross assessment Stereognosis: Not tested Hot/Cold: Appears Intact Proprioception: Not tested Coordination Gross Motor Movements are Fluid and Coordinated: Yes Fine Motor Movements are Fluid and Coordinated: Yes Motor  Motor Motor: Other (comment) Motor - Discharge Observations: generalized weakness  Mobility Bed Mobility Bed Mobility: Supine to Sit;Sit to Supine Supine to Sit: 6: Modified independent (Device/Increase time) Sit to Supine: 6: Modified independent (Device/Increase time) Transfers Transfers: Yes Sit to Stand: 6: Modified independent (Device/Increase time) Stand to Sit: 6: Modified independent (Device/Increase time) Stand Pivot Transfers: 6: Modified independent (Device/Increase time) Locomotion  Ambulation Ambulation: Yes Ambulation/Gait Assistance: 6: Modified independent (Device/Increase time) Ambulation Distance (Feet): 75 Feet Assistive device: Rolling walker Ambulation/Gait Assistance Details: Verbal cues for precautions/safety;Verbal cues for safe use of DME/AE Ambulation/Gait Assistance Details: Pt mod(I) for amb in controlled/home environments, but benefits from (S) in busy community environments.  Gait Gait: Yes Gait Pattern: Impaired Gait Pattern: Step-through pattern;Shuffle;Decreased hip/knee flexion - left;Decreased hip/knee flexion - right;Decreased step length - right;Decreased step length - left;Trunk flexed Gait velocity: decreased Stairs / Additional Locomotion Stairs: No (Pt declined to attempt due to fear of falling) Wheelchair Mobility Wheelchair Mobility: Yes  Trunk/Postural Assessment   Cervical Assessment Cervical Assessment: Within Functional Limits Thoracic Assessment Thoracic Assessment: Exceptions to WFL (slight kyphotic posture) Lumbar Assessment Lumbar Assessment: Within Functional Limits Postural Control Postural Control: Within Functional Limits  Balance Static Sitting Balance Static Sitting - Balance Support: Feet supported Static Sitting - Level of Assistance: 7: Independent Dynamic Sitting Balance Dynamic Sitting - Balance Support: Feet supported;Left upper extremity supported;Right upper extremity supported Dynamic Sitting - Level of Assistance: 6: Modified independent (Device/Increase time) Dynamic Sitting - Balance Activities: Lateral lean/weight shifting;Forward lean/weight shifting;Reaching for Consulting civil engineer Standing - Balance Support: Bilateral upper extremity supported Static Standing - Level of Assistance: 6: Modified independent (Device/Increase time) Dynamic Standing Balance Dynamic Standing - Balance Support: Bilateral upper extremity supported;Left upper extremity supported;Right upper extremity supported Dynamic Standing - Level of Assistance: 6: Modified independent (Device/Increase time) Dynamic Standing - Balance Activities: Forward lean/weight shifting;Lateral lean/weight shifting;Reaching for objects Extremity Assessment  RUE Assessment RUE Assessment: Within Functional Limits LUE Assessment LUE Assessment: Within Functional Limits RLE Assessment RLE Assessment: Within Functional Limits (Grossly 4/5) LLE Assessment LLE Assessment: Within Functional Limits (Grossly 4/5)  See FIM for current functional status  Gilmore Laroche 11/12/2013, 4:16 PM

## 2013-11-12 NOTE — Progress Notes (Signed)
Subjective/Complaints:  No new issues. Headaches better. Excited to go home. Low grade temp noted A 12 point review of systems has been performed and if not noted above is otherwise negative.  Objective: Vital Signs: Blood pressure 124/75, pulse 68, temperature 99 F (37.2 C), temperature source Oral, resp. rate 17, weight 113.6 kg (250 lb 7.1 oz), SpO2 94.00%. No results found. No results found for this basename: WBC, HGB, HCT, PLT,  in the last 72 hours No results found for this basename: NA, K, CL, CO, GLUCOSE, BUN, CREATININE, CALCIUM,  in the last 72 hours CBG (last 3)   Recent Labs  11/11/13 1632 11/11/13 2028 11/12/13 0726  GLUCAP 136* 174* 133*    Wt Readings from Last 3 Encounters:  11/11/13 113.6 kg (250 lb 7.1 oz)  10/26/13 122.1 kg (269 lb 2.9 oz)  10/26/13 122.1 kg (269 lb 2.9 oz)    Physical Exam:  Constitutional: He is oriented to person, place, and time. He appears well-developed and well-nourished.  Polite and appropriate.  HENT:  Head: Normocephalic.  Left crani incision with staples out.  Eyes: Conjunctivae are normal. Pupils are equal, round, and reactive to light.  Neck: Normal range of motion.  Cardiovascular: Normal rate and regular rhythm.  No murmur heard.  Respiratory: Effort normal and breath sounds normal. No respiratory distress. No wheezes  GI: Soft. Bowel sounds are normal. He exhibits no distension. There is no tenderness.  Musculoskeletal: He exhibits no edema.  Well healed old scars on left knee and left ankle.  Neurological: He is alert and oriented to person, place, and time.    Tracks to all visual fields. Speech clearer. Follows all simple commands. Has intact basic insight and awareness.  Oriented to day, date, place. Strength RUE 4/5 bic,tri,delt, hand/wrist. LUE 4+ prox to distal. RLE 3+/5 HF, 4 KE and ankle. LLE is 4 HF, 4/5 knee, 4/5 ankle. No gross sensory deficits.  Skin: Skin is warm and dry.  Psychiatric:  Calm,  Pleasant.    Assessment/Plan: 1. Functional deficits secondary to [facility] which require 3+ hours per day of interdisciplinary therapy in a comprehensive inpatient rehab setting. Physiatrist is providing close team supervision and 24 hour management of active medical problems listed below. Physiatrist and rehab team continue to assess barriers to discharge/monitor patient progress toward functional and medical goals. FIM: FIM - Bathing Bathing Steps Patient Completed: Chest;Right Arm;Left Arm;Abdomen;Front perineal area;Buttocks;Right upper leg;Left upper leg Bathing: 4: Min-Patient completes 8-9 36f 10 parts or 75+ percent  FIM - Upper Body Dressing/Undressing Upper body dressing/undressing steps patient completed: Thread/unthread right sleeve of pullover shirt/dresss;Thread/unthread left sleeve of pullover shirt/dress;Put head through opening of pull over shirt/dress;Pull shirt over trunk Upper body dressing/undressing: 5: Set-up assist to: Obtain clothing/put away FIM - Lower Body Dressing/Undressing Lower body dressing/undressing steps patient completed: Thread/unthread right underwear leg;Thread/unthread left underwear leg;Pull underwear up/down;Thread/unthread right pants leg;Thread/unthread left pants leg;Pull pants up/down;Don/Doff right sock;Don/Doff left sock;Don/Doff right shoe;Don/Doff left shoe Lower body dressing/undressing: 5: Supervision: Safety issues/verbal cues  FIM - Toileting Toileting steps completed by patient: Adjust clothing prior to toileting;Performs perineal hygiene;Adjust clothing after toileting Toileting Assistive Devices: Grab bar or rail for support Toileting: 5: Supervision: Safety issues/verbal cues  FIM - Radio producer Devices: Insurance account manager Transfers: 5-To toilet/BSC: Supervision (verbal cues/safety issues);5-From toilet/BSC: Supervision (verbal cues/safety issues)  FIM - Bed/Chair Transfer Bed/Chair Transfer Assistive Devices:  Adult nurse Transfer: 5: Supine > Sit: Supervision (verbal cues/safety  issues);5: Sit > Supine: Supervision (verbal cues/safety issues);4: Bed > Chair or W/C: Min A (steadying Pt. > 75%);4: Chair or W/C > Bed: Min A (steadying Pt. > 75%)  FIM - Locomotion: Wheelchair Distance: 70 Locomotion: Wheelchair: 2: Travels 50 - 149 ft with supervision, cueing or coaxing FIM - Locomotion: Ambulation Locomotion: Ambulation Assistive Devices: Administrator Ambulation/Gait Assistance: 5: Supervision Locomotion: Ambulation: 2: Travels 50 - 149 ft with supervision/safety issues  Comprehension Comprehension Mode: Auditory Comprehension: 4-Understands basic 75 - 89% of the time/requires cueing 10 - 24% of the time  Expression Expression Mode: Verbal Expression: 3-Expresses basic 50 - 74% of the time/requires cueing 25 - 50% of the time. Needs to repeat parts of sentences.  Social Interaction Social Interaction: 4-Interacts appropriately 75 - 89% of the time - Needs redirection for appropriate language or to initiate interaction.  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 DVT Prophylaxis/Anticoagulation: Pharmaceutical: Lovenox  2. Pain Management: continue hydrocodone prn for HA and leg pain---generally controlled 3. Mood: No signs of distress or agitation. LCSW to follow for evaluation.  4. Neuropsych: This patient is not capable of making decisions on his own behalf.  5. FUO: will continue to monitor. Improved but urine positive for E coli---on cipro day 3/5  -encourage IS 6. Schizoaffective disorder: resume lamictal and geodon at home doses. 7. DM type 2: Used 70/30 insulin at home--50 u/am and  35 u/pm. On SSI at this time. resumed low dose 70/30 insulin 5u bid with good results so far  8. CKD: Renal status normal at this time. -off Diovan/HCTZ currently.   -CR 1.5  9. HTN: Monitor with bid checks. Resumed metoprolol at lower dose and titrate up as indicated--monitor HR. Off Diovan/HCTZ at this time.   LOS (Days) 6 A FACE TO FACE EVALUATION WAS PERFORMED  SWARTZ,ZACHARY T 11/12/2013 8:03 AM

## 2013-11-13 LAB — GLUCOSE, CAPILLARY
GLUCOSE-CAPILLARY: 180 mg/dL — AB (ref 70–99)
Glucose-Capillary: 134 mg/dL — ABNORMAL HIGH (ref 70–99)

## 2013-11-13 MED ORDER — METOPROLOL TARTRATE 25 MG PO TABS: 12.5000 mg | ORAL_TABLET | Freq: Two times a day (BID) | ORAL | Status: DC

## 2013-11-13 MED ORDER — OXYCODONE HCL 5 MG PO TABS: 5.0000 mg | ORAL_TABLET | Freq: Four times a day (QID) | ORAL | Status: DC | PRN

## 2013-11-13 MED ORDER — SIMVASTATIN 80 MG PO TABS: 80.0000 mg | ORAL_TABLET | Freq: Every day | ORAL | Status: DC

## 2013-11-13 MED ORDER — INSULIN ASPART PROT & ASPART (70-30 MIX) 100 UNIT/ML ~~LOC~~ SUSP
8.0000 [IU] | Freq: Two times a day (BID) | SUBCUTANEOUS | Status: DC
  Administered 2013-11-13: 8 [IU] via SUBCUTANEOUS

## 2013-11-13 MED ORDER — LISINOPRIL 10 MG PO TABS: 10.0000 mg | ORAL_TABLET | Freq: Every day | ORAL | Status: DC

## 2013-11-13 MED ORDER — SENNOSIDES-DOCUSATE SODIUM 8.6-50 MG PO TABS: 1.0000 | ORAL_TABLET | Freq: Two times a day (BID) | ORAL | Status: DC

## 2013-11-13 MED ORDER — NIMODIPINE 30 MG PO CAPS: 60.0000 mg | ORAL_CAPSULE | Freq: Three times a day (TID) | ORAL | Status: DC

## 2013-11-13 MED ORDER — CIPROFLOXACIN HCL 250 MG PO TABS: 250.0000 mg | ORAL_TABLET | Freq: Two times a day (BID) | ORAL | Status: DC

## 2013-11-13 MED ORDER — INSULIN ASPART PROT & ASPART (70-30 MIX) 100 UNIT/ML ~~LOC~~ SUSP: 8.0000 [IU] | Freq: Two times a day (BID) | SUBCUTANEOUS | Status: DC

## 2013-11-13 MED ORDER — NIMODIPINE 30 MG PO CAPS
60.0000 mg | ORAL_CAPSULE | Freq: Four times a day (QID) | ORAL | Status: DC
  Administered 2013-11-13: 60 mg via ORAL

## 2013-11-13 NOTE — Progress Notes (Signed)
Social Work  Discharge Note  The overall goal for the admission was met for:   Discharge location: Yes - returning to group home ("[facility]")  Length of Stay: Yes - 7 days  Discharge activity level: Yes - supervision  Home/community participation: Yes  Services provided included: MD, RD, PT, OT, SLP, RN, TR, Pharmacy and [place]: Medicare and Medicaid  Follow-up services arranged: Home Health: PT via [facility], DME: rolling walker via [facility] and Patient/Family has no preference for HH/DME agencies  Comments (or additional information):  Patient/Family verbalized understanding of follow-up arrangements: Yes  Individual responsible for coordination of the follow-up plan: patient/ brother/ group home mgr  Confirmed correct DME delivered: Karsen Fellows 11/13/2013    Emersen Carroll

## 2013-11-13 NOTE — Plan of Care (Signed)
Problem: RH SKIN INTEGRITY Goal: RH STG MAINTAIN SKIN INTEGRITY WITH ASSISTANCE STG Maintain Skin Integrity With min Assistance.  Outcome: Completed/Met Date Met:  11/13/13 Incision healed.

## 2013-11-13 NOTE — Plan of Care (Signed)
Problem: RH COGNITION-NURSING Goal: RH STG ANTICIPATES NEEDS/CALLS FOR ASSIST W/ASSIST/CUES STG Anticipates Needs/Calls for Assist With min Assistance/Cues.  Outcome: Not Met (add Reason) Poor safety awareness. Called staff after he had self transferred to bathroom

## 2013-11-13 NOTE — Plan of Care (Signed)
Problem: RH SKIN INTEGRITY Goal: RH STG ABLE TO PERFORM INCISION/WOUND CARE W/ASSISTANCE STG Able To Perform Incision/Wound Care With mod Assistance.  Outcome: Not Met (add Reason) Incision healed

## 2013-11-13 NOTE — Progress Notes (Signed)
Discharge summary # [phone]

## 2013-11-13 NOTE — Progress Notes (Signed)
Speech Language Pathology Discharge Summary  Patient Details  Name: KHRIZ LIDDY MRN: [id number] Date of Birth: 1951/05/26  Today's Date: 11/13/2013  Patient has met 3 of 3 long term goals.  Patient to discharge at overall Supervision level.   Reasons goals not met: N/A   Clinical Impression/Discharge Summary: Pt has made functional gains and has met 3 of 3 LTG's this admission due to improved problem solving, awareness and memory. Pt to discharge at an overall supervision level to perform functional tasks safety in regards to cognitive function. Staff from pt's group home have been present to observe patient and stated that he is at an appropriate level to return to the group home. Staff verbalize ability to provide pt with supervision as needed upon discharge. Pt would benefit from f/u skilled SLP services to maximize cognitive function and overall functional independence.   Care Partner:  Caregiver Able to Provide Assistance: Yes  Type of Caregiver Assistance: Physical;Cognitive  Recommendation:  Home Health SLP;24 hour supervision/assistance  Rationale for SLP Follow Up: Maximize cognitive function and independence   Equipment: N/A   Reasons for discharge: Treatment goals met;Discharged from hospital   Patient/Family Agrees with Progress Made and Goals Achieved: Yes   See FIM for current functional status  Shaelin Lalley 11/13/2013, 7:21 AM

## 2013-11-13 NOTE — Progress Notes (Signed)
Pt discharged home with daughter. Discharge instructions provided by Algis Liming, PA. All questions answered. Pt verbalized understanding. Pt escorted off unit in w/c by Bonnita Nasuti, [place]

## 2013-11-13 NOTE — Progress Notes (Signed)
Subjective/Complaints:  Home today! No complaints A 12 point review of systems has been performed and if not noted above is otherwise negative.  Objective: Vital Signs: Blood pressure 127/58, pulse 60, temperature 98.5 F (36.9 C), temperature source Oral, resp. rate 18, weight 113.6 kg (250 lb 7.1 oz), SpO2 95.00%. No results found. No results found for this basename: WBC, HGB, HCT, PLT,  in the last 72 hours No results found for this basename: NA, K, CL, CO, GLUCOSE, BUN, CREATININE, CALCIUM,  in the last 72 hours CBG (last 3)   Recent Labs  11/12/13 1616 11/12/13 2126 11/13/13 0713  GLUCAP 158* 210* 134*    Wt Readings from Last 3 Encounters:  11/11/13 113.6 kg (250 lb 7.1 oz)  10/26/13 122.1 kg (269 lb 2.9 oz)  10/26/13 122.1 kg (269 lb 2.9 oz)    Physical Exam:  Constitutional: He is oriented to person, place, and time. He appears well-developed and well-nourished.  Polite and appropriate.  HENT:  Head: Normocephalic.  Left crani incision with staples out.  Eyes: Conjunctivae are normal. Pupils are equal, round, and reactive to light.  Neck: Normal range of motion.  Cardiovascular: Normal rate and regular rhythm.  No murmur heard.  Respiratory: Effort normal and breath sounds normal. No respiratory distress. No wheezes  GI: Soft. Bowel sounds are normal. He exhibits no distension. There is no tenderness.  Musculoskeletal: He exhibits no edema.  Well healed old scars on left knee and left ankle.  Neurological: He is alert and oriented to person, place, and time.    Tracks to all visual fields. Speech clearer. Follows all simple commands. Has intact basic insight and awareness.  Oriented to day, date, place. Strength RUE 4/5 bic,tri,delt, hand/wrist. LUE 4+ prox to distal. RLE 3+/5 HF, 4 KE and ankle. LLE is 4 HF, 4/5 knee, 4/5 ankle. No gross sensory deficits.  Skin: Skin is warm and dry.  Psychiatric:  Calm, Pleasant.    Assessment/Plan: 1.  Functional deficits secondary to [facility] which require 3+ hours per day of interdisciplinary therapy in a comprehensive inpatient rehab setting. Physiatrist is providing close team supervision and 24 hour management of active medical problems listed below. Physiatrist and rehab team continue to assess barriers to discharge/monitor patient progress toward functional and medical goals.  Goals met. Follow up with me in a month or so FIM: FIM - Bathing Bathing Steps Patient Completed: Chest;Right Arm;Left Arm;Abdomen;Front perineal area;Buttocks;Left lower leg (including foot);Right lower leg (including foot);Left upper leg;Right upper leg Bathing: 6: More than reasonable amount of time  FIM - Upper Body Dressing/Undressing Upper body dressing/undressing steps patient completed: Thread/unthread right sleeve of pullover shirt/dresss;Thread/unthread left sleeve of pullover shirt/dress;Put head through opening of pull over shirt/dress;Pull shirt over trunk Upper body dressing/undressing: 6: More than reasonable amount of time FIM - Lower Body Dressing/Undressing Lower body dressing/undressing steps patient completed: Thread/unthread right underwear leg;Thread/unthread left underwear leg;Pull underwear up/down;Thread/unthread right pants leg;Don/Doff right sock;Fasten/unfasten pants;Pull pants up/down;Thread/unthread left pants leg;Don/Doff left sock;Don/Doff right shoe;Don/Doff left shoe Lower body dressing/undressing: 6: More than reasonable amount of time  FIM - Toileting Toileting steps completed by patient: Adjust clothing prior to toileting;Performs perineal hygiene;Adjust clothing after toileting Toileting Assistive Devices: Grab bar or rail for support Toileting: 6: More than reasonable amount of time  FIM - Radio producer Devices: Insurance account manager Transfers: 6-To toilet/ BSC;6-More than reasonable amt of time  FIM - Control and instrumentation engineer  Devices:  Walker Bed/Chair Transfer: 6: Supine > Sit: No assist;6: Sit > Supine: No assist;6: Bed > Chair or W/C: No assist;6: Chair or W/C > Bed: No assist  FIM - Locomotion: Wheelchair Distance: 70 Locomotion: Wheelchair: 5: Travels 150 ft or more: maneuvers on rugs and over door sills with supervision, cueing or coaxing FIM - Locomotion: Ambulation Locomotion: Ambulation Assistive Devices: Administrator Ambulation/Gait Assistance: 6: Modified independent (Device/Increase time) Locomotion: Ambulation: 5: Household Independent - travels 22 - 149 ft independent or modified independent  Comprehension Comprehension Mode: Auditory Comprehension: 5-Understands basic 90% of the time/requires cueing < 10% of the time  Expression Expression Mode: Verbal Expression: 5-Expresses basic 90% of the time/requires cueing < 10% of the time.  Social Interaction Social Interaction: 5-Interacts appropriately 90% of the time - Needs monitoring or encouragement for participation or interaction.  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 DVT Prophylaxis/Anticoagulation: Pharmaceutical: Lovenox  2. Pain Management: continue hydrocodone prn for HA and leg pain---generally controlled 3. Mood: No signs of distress or agitation. LCSW to follow for evaluation.  4. Neuropsych: This patient is not capable of making decisions on his own behalf.  5. FUO: will continue to monitor. Improved but urine positive for E coli---on cipro day 3/5  -encourage IS 6. Schizoaffective disorder: resume lamictal and geodon at home doses. 7. DM type 2: Used 70/30 insulin at home--50 u/am and 35 u/pm. On SSI at this time. resumed low dose 70/30 insulin 5u bid with good results generally.  Could titrate up 8 units bid at dc 8. CKD: Renal status normal at this time. -off Diovan/HCTZ currently.   -CR 1.5  9. HTN: Monitor with bid checks. Resumed metoprolol at lower dose and titrate up as indicated--monitor HR. Off Diovan/HCTZ at this time.   LOS (Days) 7 A FACE TO FACE EVALUATION WAS PERFORMED  Emalynn Clewis T 11/13/2013 7:58 AM

## 2013-11-13 NOTE — Plan of Care (Signed)
Problem: RH PAIN MANAGEMENT Goal: RH STG PAIN MANAGED AT OR BELOW PT'S PAIN GOAL 2/10  Outcome: Completed/Met Date Met:  11/13/13 No c/o pain

## 2013-11-14 NOTE — Discharge Summary (Signed)
NAME:  Riley Stuart, Riley Stuart NO.:  [id number]  MEDICAL RECORD NO.:  [id number]  LOCATION:  [id number]                        FACILITY:  [facility]  PHYSICIAN:  Meredith Staggers, M.D.DATE OF BIRTH:  1951-01-19  DATE OF ADMISSION:  11/06/2013 DATE OF DISCHARGE:  11/13/2013                              DISCHARGE SUMMARY   DISCHARGE DIAGNOSES: 1. 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 2. Schizoaffective disorder, stable. 3. Diabetes mellitus, type 2. 4. Chronic kidney disease. 5. Hypertension. 6. Fever, unknown origin, resolved.  HISTORY OF PRESENT ILLNESS:  Riley Stuart is a 63 year old male with history of DM type 2, CKD, schizoaffective disorder.  He was admitted from [facility] on October 26, 2013, with a 3-day history of dizziness and headache.  CT of head showed subarachnoid hemorrhage.  CT of brain showed approximately 3 mm ACOM aneurysm projecting superior and rightwards, which was likely the source of hemorrhage.  He underwent left frontotemporal craniotomy for clipping of aneurysm by Dr. Kathyrn Sheriff on October 27, 2013.  He developed pink tinged drainage from nose and was placed on bedrest due to CSF leak.  This has currently resolved.  He is on Nimotop to prevent vasospasms.  The patient does continue to have fevers and was pancultured on November 05, 2013.  Rehab team recommended CIR and he was admitted for further therapies.  PAST MEDICAL HISTORY:  DM type 2, schizoaffective disorder, hypertension, CKD.  ALLERGIES:  No known drug allergies.  SOCIAL HISTORY:  The patient lives in a group home.  Was independent without an assistive device prior to admission.  He smokes 1 pack per day.  Does not use any alcohol or illicit drugs.  HOSPITAL COURSE:  Riley Stuart was admitted to Rehab on November 06, 2013, for inpatient therapies to consist of PT, OT, and speech therapy at least 3 hours 5 days a week.  Past admission, physiatrist, rehab,  RN, and therapy team have worked together to provide customized collaborative interdisciplinary care.  The patient's blood pressures have been monitored on b.i.d. basis and have been stable off Diovan/hydrochlorothiazide. His Nimotop was decreased to q.6 hours and he is to continue on this for additional 5 days to complete his 21-day course.  Lamictal was resumed and Geodon was increased to home dose.  Mood has been stable and no signs of agitation or confusion noted.  He has been afebrile during his stay.  Urine culture showed greater than 100,000 colonies of E. coli and he was started on Cipro for treatment. His blood cultures have been negative.  His 70/30 insulin was resumed and blood sugars have been checked on AC/HS basis.  Insulin was titrated to 8 units and his caregiver was instructed to monitor blood sugars on before meals and nightly basis for now. He is to follow up with primary MD for further titration of insulin.   The patient's renal status has been monitored along and recheck lytes of November 09, 2013, revealed sodium 143, potassium 4.0, chloride 106, CO2 of 21, BUN 20, creatinine 1.51, glucose 135.  CBC reveals hemoglobin 12.0, hematocrit 33.7, white count 9.3, platelets 400.  The patient's cranial incision has been healing well without any signs or symptoms of infection.  Staples were discontinued without difficulty.  The patient's p.o. intake has been good.  He has been continent of bowel and bladder.  He has made good progress and is currently at supervision level.  During the patient's stay in rehab, weekly team conferences were held to monitor the patient's progress, set goals, as well as discuss barriers to discharge.  The patient has shown improvement in activity tolerance, balance, strength, attention, awareness, as well as coordination.  The patient is currently modified independent for transfers and mobility in home environment.  He requires supervision in community environment.  He  is currently able to perform bathing and dressing tasks at supervision level.  He does have deliberate movements and requires rest breaks to complete this task.  Speech Therapy has worked with the patient on problem solving,awareness, and memory.  The patient is at supervision level to  perform functional tasks safely in regard to cognitive function. Family education was done with staff from group home, and they will provide appropriate level of supervision past discharge.  The patient will continue to receive further followup home health, PT via [facility] past discharge.  On December 14, 2013, the patient is discharged to home in improved condition.  DISCHARGE MEDICATIONS: 1. Cipro 250 mg p.o. per day. 2. 70/30 insulin 8 units p.o. b.i.d. 3. Lisinopril 10 mg p.o. per day. 4. Metoprolol 25 mg half p.o. b.i.d. 5. OxyIR 5 mg p.o. q.6 hours p.r.n. severe pain, #30 Rx. 6. Senokot-S 1 p.o. b.i.d. 7. Simvastatin 80 mg p.o. at bedtime. 8. Nimotop 60 mg p.o. q.i.d. until gone. 9. Geodon 40 mg q.5 p.m. and 80 mg at bedtime. 10.Tylenol 650 mg p.o. q.6 hours p.r.n. mild pain. 11.Os-Cal plus D b.i.d. 12.TriCor 145 mg p.o. per day. 13.Lamictal 100 mg p.o. b.i.d. 14.Omeprazole 20 mg p.o. per day.  DIET:  Diabetic diet.  ACTIVITY LEVEL:  As tolerated with 24-hour supervision.  SPECIAL INSTRUCTIONS:  Do not use tramadol, Diovan, hydrochlorothiazide. Note decreased in Toprol-XL dose.  Check sugars before meals and nightly basis and record.  [facility] to provide physical therapy.  FOLLOWUP:  The patient to follow up with Dr. Nicky Pugh on November 27, 2013, at 10 a.m.  Follow up with Dr. Kathyrn Sheriff in 4 weeks.  Follow up with Dr. Alger Simons on Jan 05, 2014, at 11:20 for 11:40 appointment.     Reesa Chew, P.A.   ______________________________ Meredith Staggers, M.D.    PL/MEDQ  D:  11/13/2013  T:  11/14/2013  Job:  [id number]  cc:   Nicky Pugh, MD Dr. Consuella Lose

## 2013-11-17 ENCOUNTER — Telehealth: Payer: Self-pay

## 2013-11-17 NOTE — Telephone Encounter (Signed)
Riley Stuart a physical therapist with [facility] called to get verbal order to continue therapy.  Please call.

## 2013-11-18 ENCOUNTER — Telehealth: Payer: Self-pay

## 2013-11-18 NOTE — Telephone Encounter (Signed)
Claiborne Billings a physical therapist with [facility] called to get verbal to continue therapy.

## 2013-11-19 NOTE — Telephone Encounter (Signed)
Left message for Claiborne Billings to continue therapy for patient.

## 2014-01-05 ENCOUNTER — Encounter: Payer: Medicare Other | Attending: Physical Medicine & Rehabilitation | Admitting: Physical Medicine & Rehabilitation

## 2014-01-05 ENCOUNTER — Encounter: Payer: Self-pay | Admitting: Physical Medicine & Rehabilitation

## 2014-01-05 VITALS — Ht 74.0 in | Wt 252.0 lb

## 2014-01-05 DIAGNOSIS — M545 Low back pain, unspecified: Secondary | ICD-10-CM | POA: Insufficient documentation

## 2014-01-05 DIAGNOSIS — F17200 Nicotine dependence, unspecified, uncomplicated: Secondary | ICD-10-CM | POA: Insufficient documentation

## 2014-01-05 DIAGNOSIS — M2170 Unequal limb length (acquired), unspecified site: Secondary | ICD-10-CM

## 2014-01-05 DIAGNOSIS — I607 Nontraumatic subarachnoid hemorrhage from unspecified intracranial artery: Secondary | ICD-10-CM

## 2014-01-05 DIAGNOSIS — I129 Hypertensive chronic kidney disease with stage 1 through stage 4 chronic kidney disease, or unspecified chronic kidney disease: Secondary | ICD-10-CM | POA: Insufficient documentation

## 2014-01-05 DIAGNOSIS — N189 Chronic kidney disease, unspecified: Secondary | ICD-10-CM | POA: Insufficient documentation

## 2014-01-05 DIAGNOSIS — I609 Nontraumatic subarachnoid hemorrhage, unspecified: Secondary | ICD-10-CM | POA: Insufficient documentation

## 2014-01-05 DIAGNOSIS — I6350 Cerebral infarction due to unspecified occlusion or stenosis of unspecified cerebral artery: Secondary | ICD-10-CM

## 2014-01-05 DIAGNOSIS — E119 Type 2 diabetes mellitus without complications: Secondary | ICD-10-CM | POA: Insufficient documentation

## 2014-01-05 NOTE — Progress Notes (Signed)
Subjective:    Patient ID: Riley Stuart, male    DOB: 06/29/1951, 63 y.o.   MRN: [id number]  HPI  Riley Stuart is back regarding his left SAH and craniotomy. He has been doing very well. PT has discharged from [facility] PT. He is improving with his balance. He is no longer using a cane or a walker. He is only walking around the group home or in the grocery store, etc. He hasn't done any focused exercise.   His back is tender when he wakes up in the morning. It usually lasts for about 30 minutes to an hour and goes away on its home. He is only on tylenol currently which seems to help with his pain.     Pain Inventory Average Pain 6 Pain Right Now 2 My pain is burning, dull and tingling  In the last 24 hours, has pain interfered with the following? General activity 6 Relation with others 4 Enjoyment of life 4 What TIME of day is your pain at its worst? morning Sleep (in general) Fair  Pain is worse with: bending Pain improves with: medication Relief from Meds: na  Mobility walk without assistance how many minutes can you walk? 45 transfers alone Do you have any goals in this area?  yes  Function disabled: date disabled na  Neuro/Psych No problems in this area  Prior Studies Any changes since last visit?  no  Physicians involved in your care Any changes since last visit?  no   History reviewed. No pertinent family history. History   Social History  . Marital Status: Unknown    Spouse Name: N/A    Number of Children: N/A  . Years of Education: N/A   Social History Main Topics  . Smoking status: Current Every Day Smoker -- 1.00 packs/day  . Smokeless tobacco: None  . Alcohol Use: None  . Drug Use: None  . Sexual Activity: None   Other Topics Concern  . None   Social History Narrative  . None   Past Surgical History  Procedure Laterality Date  . Ankle fracture surgery    . Knee surgery      due to fracture.   . Craniotomy Left 10/27/2013   Procedure: Craniotomy for Aneurysm Clipping;  Surgeon: Consuella Lose, MD;  Location: [facility];  Service: Neurosurgery;  Laterality: Left;   Past Medical History  Diagnosis Date  . Diabetes mellitus without complication   . Mental disorder     schizoaffective  . Hypertension   . Chronic kidney disease     renal insufficiency   Ht 6\' 2"  (1.88 m)  Wt 252 lb (114.306 kg)  BMI 32.34 kg/m2  Opioid Risk Score:   Fall Risk Score: Moderate Fall Risk (6-13 points) (pt educated on fall risk, pt given brochure on fall risk)   Review of Systems  Musculoskeletal: Positive for back pain.  Neurological: Positive for headaches.  All other systems reviewed and are negative.      Objective:   Physical Exam  Constitutional: He is oriented to person, place, and time. He appears well-developed and well-nourished.  Polite and appropriate.  HENT:  Head: Normocephalic.  Left crani incision with staples out.  Eyes: Conjunctivae are normal. Pupils are equal, round, and reactive to light.  Neck: Normal range of motion.  Cardiovascular: Normal rate and regular rhythm.  No murmur heard.  Respiratory: Effort normal and breath sounds normal. No respiratory distress. No wheezes  GI: Soft. Bowel sounds are normal.  He exhibits no distension. There is no tenderness.  Musculoskeletal: He exhibits no edema.  Well healed old scars on left knee and left ankle.  He has elevation of the right hemipelvis by 1/4". He steps down into the left leg during weight bearing.  Neurological: He is alert and oriented to person, place, and time.  Tracks to all visual fields. Speech clearer. Follows all simple commands. Has intact basic insight and awareness. Oriented to day, date, place. Strength RUE: deltoid 5/5, bicep 5/5, tricep 5/5, wrist ext 5/5, hand intrinsics 5/5 LUE: deltoid 5/5, bicep 5/5, triceps 5/5, wrist ext 5/5, hand instrincs 5/5   LE's are 5/5.  Skin: Skin is warm and dry.  Psychiatric:  Calm,  Pleasant.  Assessment/Plan:  1. SAH d/t A-com aneurysm --  -he may resume exercise as tolerated.   2. Pain Management: tylenol for pain   -exercises for low back were provided  -weight loss is important also 3. Left leg length discrepancy:  -discussed shoe insert 1/4 inch left heel 4. Schizoaffective disorder: resume lamictal and geodon at home doses.  5.  HTN: Monitor with bid checks. Resumed metoprolol at lower dose and titrate up as indicated--monitor HR. Off Diovan/HCTZ at this time.   He may return to see me prn. 30 minutes of face to face patient care time were spent during this visit. All questions were encouraged and answered.

## 2014-01-05 NOTE — Patient Instructions (Signed)
RESUME EXERCISE AS TOLERATED. MAKE SURE YOU'RE GETTING ENOUGH FLUIDS AND VEGETABLES CONSIDER 1/4 INCH LEFT HEEL INSERT   Low Back Strain with Rehab A strain is an injury in which a tendon or muscle is torn. The muscles and tendons of the lower back are vulnerable to strains. However, these muscles and tendons are very strong and require a great force to be injured. Strains are classified into three categories. Grade 1 strains cause pain, but the tendon is not lengthened. Grade 2 strains include a lengthened ligament, due to the ligament being stretched or partially ruptured. With grade 2 strains there is still function, although the function may be decreased. Grade 3 strains involve a complete tear of the tendon or muscle, and function is usually impaired. SYMPTOMS   Pain in the lower back.  Pain that affects one side more than the other.  Pain that gets worse with movement and may be felt in the hip, buttocks, or back of the thigh.  Muscle spasms of the muscles in the back.  Swelling along the muscles of the back.  Loss of strength of the back muscles.  Crackling sound (crepitation) when the muscles are touched. CAUSES  Lower back strains occur when a force is placed on the muscles or tendons that is greater than they can handle. Common causes of injury include:  Prolonged overuse of the muscle-tendon units in the lower back, usually from incorrect posture.  A single violent injury or force applied to the back. RISK INCREASES WITH:  Sports that involve twisting forces on the spine or a lot of bending at the waist (football, rugby, weightlifting, bowling, golf, tennis, speed skating, racquetball, swimming, running, gymnastics, diving).  Poor strength and flexibility.  Failure to warm up properly before activity.  Family history of lower back pain or disk disorders.  Previous back injury or surgery (especially fusion).  Poor posture with lifting, especially heavy  objects.  Prolonged sitting, especially with poor posture. PREVENTION   Learn and use proper posture when sitting or lifting (maintain proper posture when sitting, lift using the knees and legs, not at the waist).  Warm up and stretch properly before activity.  Allow for adequate recovery between workouts.  Maintain physical fitness:  Strength, flexibility, and endurance.  Cardiovascular fitness. PROGNOSIS  If treated properly, lower back strains usually heal within 6 weeks. RELATED COMPLICATIONS   Recurring symptoms, resulting in a chronic problem.  Chronic inflammation, scarring, and partial muscle-tendon tear.  Delayed healing or resolution of symptoms.  Prolonged disability. TREATMENT  Treatment first involves the use of ice and medicine, to reduce pain and inflammation. The use of strengthening and stretching exercises may help reduce pain with activity. These exercises may be performed at home or with a therapist. Severe injuries may require referral to a therapist for further evaluation and treatment, such as ultrasound. Your caregiver may advise that you wear a back brace or corset, to help reduce pain and discomfort. Often, prolonged bed rest results in greater harm then benefit. Corticosteroid injections may be recommended. However, these should be reserved for the most serious cases. It is important to avoid using your back when lifting objects. At night, sleep on your back on a firm mattress with a pillow placed under your knees. If non-surgical treatment is unsuccessful, surgery may be needed.  MEDICATION   If pain medicine is needed, nonsteroidal anti-inflammatory medicines (aspirin and ibuprofen), or other minor pain relievers (acetaminophen), are often advised.  Do not take pain medicine for 7  days before surgery.  Prescription pain relievers may be given, if your caregiver thinks they are needed. Use only as directed and only as much as you need.  Ointments  applied to the skin may be helpful.  Corticosteroid injections may be given by your caregiver. These injections should be reserved for the most serious cases, because they may only be given a certain number of times. HEAT AND COLD  Cold treatment (icing) should be applied for 10 to 15 minutes every 2 to 3 hours for inflammation and pain, and immediately after activity that aggravates your symptoms. Use ice packs or an ice massage.  Heat treatment may be used before performing stretching and strengthening activities prescribed by your caregiver, physical therapist, or athletic trainer. Use a heat pack or a warm water soak. SEEK MEDICAL CARE IF:   Symptoms get worse or do not improve in 2 to 4 weeks, despite treatment.  You develop numbness, weakness, or loss of bowel or bladder function.  New, unexplained symptoms develop. (Drugs used in treatment may produce side effects.) EXERCISES  RANGE OF MOTION (ROM) AND STRETCHING EXERCISES - Low Back Strain Most people with lower back pain will find that their symptoms get worse with excessive bending forward (flexion) or arching at the lower back (extension). The exercises which will help resolve your symptoms will focus on the opposite motion.  Your physician, physical therapist or athletic trainer will help you determine which exercises will be most helpful to resolve your lower back pain. Do not complete any exercises without first consulting with your caregiver. Discontinue any exercises which make your symptoms worse until you speak to your caregiver.  If you have pain, numbness or tingling which travels down into your buttocks, leg or foot, the goal of the therapy is for these symptoms to move closer to your back and eventually resolve. Sometimes, these leg symptoms will get better, but your lower back pain may worsen. This is typically an indication of progress in your rehabilitation. Be very alert to any changes in your symptoms and the activities  in which you participated in the 24 hours prior to the change. Sharing this information with your caregiver will allow him/her to most efficiently treat your condition.  These exercises may help you when beginning to rehabilitate your injury. Your symptoms may resolve with or without further involvement from your physician, physical therapist or athletic trainer. While completing these exercises, remember:  Restoring tissue flexibility helps normal motion to return to the joints. This allows healthier, less painful movement and activity.  An effective stretch should be held for at least 30 seconds.  A stretch should never be painful. You should only feel a gentle lengthening or release in the stretched tissue. FLEXION RANGE OF MOTION AND STRETCHING EXERCISES: STRETCH  Flexion, Single Knee to Chest   Lie on a firm bed or floor with both legs extended in front of you.  Keeping one leg in contact with the floor, bring your opposite knee to your chest. Hold your leg in place by either grabbing behind your thigh or at your knee.  Pull until you feel a gentle stretch in your lower back. Hold __________ seconds.  Slowly release your grasp and repeat the exercise with the opposite side. Repeat __________ times. Complete this exercise __________ times per day.  STRETCH  Flexion, Double Knee to Chest   Lie on a firm bed or floor with both legs extended in front of you.  Keeping one leg in contact with  the floor, bring your opposite knee to your chest.  Tense your stomach muscles to support your back and then lift your other knee to your chest. Hold your legs in place by either grabbing behind your thighs or at your knees.  Pull both knees toward your chest until you feel a gentle stretch in your lower back. Hold __________ seconds.  Tense your stomach muscles and slowly return one leg at a time to the floor. Repeat __________ times. Complete this exercise __________ times per day.  STRETCH  Low  Trunk Rotation  Lie on a firm bed or floor. Keeping your legs in front of you, bend your knees so they are both pointed toward the ceiling and your feet are flat on the floor.  Extend your arms out to the side. This will stabilize your upper body by keeping your shoulders in contact with the floor.  Gently and slowly drop both knees together to one side until you feel a gentle stretch in your lower back. Hold for __________ seconds.  Tense your stomach muscles to support your lower back as you bring your knees back to the starting position. Repeat the exercise to the other side. Repeat __________ times. Complete this exercise __________ times per day  EXTENSION RANGE OF MOTION AND FLEXIBILITY EXERCISES: STRETCH  Extension, Prone on Elbows   Lie on your stomach on the floor, a bed will be too soft. Place your palms about shoulder width apart and at the height of your head.  Place your elbows under your shoulders. If this is too painful, stack pillows under your chest.  Allow your body to relax so that your hips drop lower and make contact more completely with the floor.  Hold this position for __________ seconds.  Slowly return to lying flat on the floor. Repeat __________ times. Complete this exercise __________ times per day.  RANGE OF MOTION  Extension, Prone Press Ups  Lie on your stomach on the floor, a bed will be too soft. Place your palms about shoulder width apart and at the height of your head.  Keeping your back as relaxed as possible, slowly straighten your elbows while keeping your hips on the floor. You may adjust the placement of your hands to maximize your comfort. As you gain motion, your hands will come more underneath your shoulders.  Hold this position __________ seconds.  Slowly return to lying flat on the floor. Repeat __________ times. Complete this exercise __________ times per day.  RANGE OF MOTION- Quadruped, Neutral Spine   Assume a hands and knees  position on a firm surface. Keep your hands under your shoulders and your knees under your hips. You may place padding under your knees for comfort.  Drop your head and point your tail bone toward the ground below you. This will round out your lower back like an angry cat. Hold this position for __________ seconds.  Slowly lift your head and release your tail bone so that your back sags into a large arch, like an old horse.  Hold this position for __________ seconds.  Repeat this until you feel limber in your lower back.  Now, find your "sweet spot." This will be the most comfortable position somewhere between the two previous positions. This is your neutral spine. Once you have found this position, tense your stomach muscles to support your lower back.  Hold this position for __________ seconds. Repeat __________ times. Complete this exercise __________ times per day.  STRENGTHENING EXERCISES - Low Back  Strain These exercises may help you when beginning to rehabilitate your injury. These exercises should be done near your "sweet spot." This is the neutral, low-back arch, somewhere between fully rounded and fully arched, that is your least painful position. When performed in this safe range of motion, these exercises can be used for people who have either a flexion or extension based injury. These exercises may resolve your symptoms with or without further involvement from your physician, physical therapist or athletic trainer. While completing these exercises, remember:   Muscles can gain both the endurance and the strength needed for everyday activities through controlled exercises.  Complete these exercises as instructed by your physician, physical therapist or athletic trainer. Increase the resistance and repetitions only as guided.  You may experience muscle soreness or fatigue, but the pain or discomfort you are trying to eliminate should never worsen during these exercises. If this pain  does worsen, stop and make certain you are following the directions exactly. If the pain is still present after adjustments, discontinue the exercise until you can discuss the trouble with your caregiver. STRENGTHENING Deep Abdominals, Pelvic Tilt  Lie on a firm bed or floor. Keeping your legs in front of you, bend your knees so they are both pointed toward the ceiling and your feet are flat on the floor.  Tense your lower abdominal muscles to press your lower back into the floor. This motion will rotate your pelvis so that your tail bone is scooping upwards rather than pointing at your feet or into the floor.  With a gentle tension and even breathing, hold this position for __________ seconds. Repeat __________ times. Complete this exercise __________ times per day.  STRENGTHENING  Abdominals, Crunches   Lie on a firm bed or floor. Keeping your legs in front of you, bend your knees so they are both pointed toward the ceiling and your feet are flat on the floor. Cross your arms over your chest.  Slightly tip your chin down without bending your neck.  Tense your abdominals and slowly lift your trunk high enough to just clear your shoulder blades. Lifting higher can put excessive stress on the lower back and does not further strengthen your abdominal muscles.  Control your return to the starting position. Repeat __________ times. Complete this exercise __________ times per day.  STRENGTHENING  Quadruped, Opposite UE/LE Lift   Assume a hands and knees position on a firm surface. Keep your hands under your shoulders and your knees under your hips. You may place padding under your knees for comfort.  Find your neutral spine and gently tense your abdominal muscles so that you can maintain this position. Your shoulders and hips should form a rectangle that is parallel with the floor and is not twisted.  Keeping your trunk steady, lift your right hand no higher than your shoulder and then your left  leg no higher than your hip. Make sure you are not holding your breath. Hold this position __________ seconds.  Continuing to keep your abdominal muscles tense and your back steady, slowly return to your starting position. Repeat with the opposite arm and leg. Repeat __________ times. Complete this exercise __________ times per day.  STRENGTHENING  Lower Abdominals, Double Knee Lift  Lie on a firm bed or floor. Keeping your legs in front of you, bend your knees so they are both pointed toward the ceiling and your feet are flat on the floor.  Tense your abdominal muscles to brace your lower back and  slowly lift both of your knees until they come over your hips. Be certain not to hold your breath.  Hold __________ seconds. Using your abdominal muscles, return to the starting position in a slow and controlled manner. Repeat __________ times. Complete this exercise __________ times per day.  POSTURE AND BODY MECHANICS CONSIDERATIONS - Low Back Strain Keeping correct posture when sitting, standing or completing your activities will reduce the stress put on different body tissues, allowing injured tissues a chance to heal and limiting painful experiences. The following are general guidelines for improved posture. Your physician or physical therapist will provide you with any instructions specific to your needs. While reading these guidelines, remember:  The exercises prescribed by your provider will help you have the flexibility and strength to maintain correct postures.  The correct posture provides the best environment for your joints to work. All of your joints have less wear and tear when properly supported by a spine with good posture. This means you will experience a healthier, less painful body.  Correct posture must be practiced with all of your activities, especially prolonged sitting and standing. Correct posture is as important when doing repetitive low-stress activities (typing) as it is  when doing a single heavy-load activity (lifting). RESTING POSITIONS Consider which positions are most painful for you when choosing a resting position. If you have pain with flexion-based activities (sitting, bending, stooping, squatting), choose a position that allows you to rest in a less flexed posture. You would want to avoid curling into a fetal position on your side. If your pain worsens with extension-based activities (prolonged standing, working overhead), avoid resting in an extended position such as sleeping on your stomach. Most people will find more comfort when they rest with their spine in a more neutral position, neither too rounded nor too arched. Lying on a non-sagging bed on your side with a pillow between your knees, or on your back with a pillow under your knees will often provide some relief. Keep in mind, being in any one position for a prolonged period of time, no matter how correct your posture, can still lead to stiffness. PROPER SITTING POSTURE In order to minimize stress and discomfort on your spine, you must sit with correct posture. Sitting with good posture should be effortless for a healthy body. Returning to good posture is a gradual process. Many people can work toward this most comfortably by using various supports until they have the flexibility and strength to maintain this posture on their own. When sitting with proper posture, your ears will fall over your shoulders and your shoulders will fall over your hips. You should use the back of the chair to support your upper back. Your lower back will be in a neutral position, just slightly arched. You may place a small pillow or folded towel at the base of your lower back for support.  When working at a desk, create an environment that supports good, upright posture. Without extra support, muscles tire, which leads to excessive strain on joints and other tissues. Keep these recommendations in mind: CHAIR:  A chair should be  able to slide under your desk when your back makes contact with the back of the chair. This allows you to work closely.  The chair's height should allow your eyes to be level with the upper part of your monitor and your hands to be slightly lower than your elbows. BODY POSITION  Your feet should make contact with the floor. If this is not  possible, use a foot rest.  Keep your ears over your shoulders. This will reduce stress on your neck and lower back. INCORRECT SITTING POSTURES  If you are feeling tired and unable to assume a healthy sitting posture, do not slouch or slump. This puts excessive strain on your back tissues, causing more damage and pain. Healthier options include:  Using more support, like a lumbar pillow.  Switching tasks to something that requires you to be upright or walking.  Talking a brief walk.  Lying down to rest in a neutral-spine position. PROLONGED STANDING WHILE SLIGHTLY LEANING FORWARD  When completing a task that requires you to lean forward while standing in one place for a long time, place either foot up on a stationary 2-4 inch high object to help maintain the best posture. When both feet are on the ground, the lower back tends to lose its slight inward curve. If this curve flattens (or becomes too large), then the back and your other joints will experience too much stress, tire more quickly, and can cause pain. CORRECT STANDING POSTURES Proper standing posture should be assumed with all daily activities, even if they only take a few moments, like when brushing your teeth. As in sitting, your ears should fall over your shoulders and your shoulders should fall over your hips. You should keep a slight tension in your abdominal muscles to brace your spine. Your tailbone should point down to the ground, not behind your body, resulting in an over-extended swayback posture.  INCORRECT STANDING POSTURES  Common incorrect standing postures include a forward head,  locked knees and/or an excessive swayback. WALKING Walk with an upright posture. Your ears, shoulders and hips should all line-up. PROLONGED ACTIVITY IN A FLEXED POSITION When completing a task that requires you to bend forward at your waist or lean over a low surface, try to find a way to stabilize 3 out of 4 of your limbs. You can place a hand or elbow on your thigh or rest a knee on the surface you are reaching across. This will provide you more stability so that your muscles do not fatigue as quickly. By keeping your knees relaxed, or slightly bent, you will also reduce stress across your lower back. CORRECT LIFTING TECHNIQUES DO :   Assume a wide stance. This will provide you more stability and the opportunity to get as close as possible to the object which you are lifting.  Tense your abdominals to brace your spine. Bend at the knees and hips. Keeping your back locked in a neutral-spine position, lift using your leg muscles. Lift with your legs, keeping your back straight.  Test the weight of unknown objects before attempting to lift them.  Try to keep your elbows locked down at your sides in order get the best strength from your shoulders when carrying an object.  Always ask for help when lifting heavy or awkward objects. INCORRECT LIFTING TECHNIQUES DO NOT:   Lock your knees when lifting, even if it is a small object.  Bend and twist. Pivot at your feet or move your feet when needing to change directions.  Assume that you can safely pick up even a paper clip without proper posture. Document Released: 08/13/2005 Document Revised: 11/05/2011 Document Reviewed: 11/25/2008 [facility] Patient Information 2014 [place], [place].

## 2014-03-10 ENCOUNTER — Other Ambulatory Visit: Payer: Self-pay | Admitting: Neurosurgery

## 2014-03-10 ENCOUNTER — Other Ambulatory Visit (HOSPITAL_COMMUNITY): Payer: Self-pay | Admitting: Neurosurgery

## 2014-03-10 DIAGNOSIS — I609 Nontraumatic subarachnoid hemorrhage, unspecified: Secondary | ICD-10-CM

## 2014-03-27 ENCOUNTER — Other Ambulatory Visit: Payer: Self-pay | Admitting: Physical Medicine & Rehabilitation

## 2014-05-06 ENCOUNTER — Encounter (HOSPITAL_COMMUNITY): Payer: Self-pay | Admitting: Pharmacy Technician

## 2014-05-11 ENCOUNTER — Other Ambulatory Visit (HOSPITAL_COMMUNITY): Payer: Self-pay | Admitting: Neurosurgery

## 2014-05-11 ENCOUNTER — Ambulatory Visit (HOSPITAL_COMMUNITY)
Admission: RE | Admit: 2014-05-11 | Discharge: 2014-05-11 | Disposition: A | Discharge status: Home / Self Care | Payer: Medicare Other | Source: Ambulatory Visit | Attending: Neurosurgery | Admitting: Neurosurgery

## 2014-05-11 DIAGNOSIS — F17200 Nicotine dependence, unspecified, uncomplicated: Secondary | ICD-10-CM | POA: Diagnosis not present

## 2014-05-11 DIAGNOSIS — I609 Nontraumatic subarachnoid hemorrhage, unspecified: Secondary | ICD-10-CM

## 2014-05-11 DIAGNOSIS — N189 Chronic kidney disease, unspecified: Secondary | ICD-10-CM | POA: Diagnosis not present

## 2014-05-11 DIAGNOSIS — I129 Hypertensive chronic kidney disease with stage 1 through stage 4 chronic kidney disease, or unspecified chronic kidney disease: Secondary | ICD-10-CM | POA: Insufficient documentation

## 2014-05-11 DIAGNOSIS — Z794 Long term (current) use of insulin: Secondary | ICD-10-CM | POA: Insufficient documentation

## 2014-05-11 DIAGNOSIS — Z48812 Encounter for surgical aftercare following surgery on the circulatory system: Secondary | ICD-10-CM | POA: Diagnosis present

## 2014-05-11 DIAGNOSIS — E119 Type 2 diabetes mellitus without complications: Secondary | ICD-10-CM | POA: Insufficient documentation

## 2014-05-11 DIAGNOSIS — F259 Schizoaffective disorder, unspecified: Secondary | ICD-10-CM | POA: Diagnosis not present

## 2014-05-11 LAB — CBC WITH DIFFERENTIAL/PLATELET
Basophils Absolute: 0 10*3/uL (ref 0.0–0.1)
Basophils Relative: 0 % (ref 0–1)
Eosinophils Absolute: 0 10*3/uL (ref 0.0–0.7)
Eosinophils Relative: 0 % (ref 0–5)
HEMATOCRIT: 38.7 % — AB (ref 39.0–52.0)
Hemoglobin: 13.4 g/dL (ref 13.0–17.0)
LYMPHS ABS: 1.5 10*3/uL (ref 0.7–4.0)
LYMPHS PCT: 23 % (ref 12–46)
MCH: 30.6 pg (ref 26.0–34.0)
MCHC: 34.6 g/dL (ref 30.0–36.0)
MCV: 88.4 fL (ref 78.0–100.0)
Monocytes Absolute: 0.4 10*3/uL (ref 0.1–1.0)
Monocytes Relative: 6 % (ref 3–12)
Neutro Abs: 4.7 10*3/uL (ref 1.7–7.7)
Neutrophils Relative %: 71 % (ref 43–77)
PLATELETS: 294 10*3/uL (ref 150–400)
RBC: 4.38 MIL/uL (ref 4.22–5.81)
RDW: 15.5 % (ref 11.5–15.5)
WBC: 6.7 10*3/uL (ref 4.0–10.5)

## 2014-05-11 LAB — BASIC METABOLIC PANEL
ANION GAP: 14 (ref 5–15)
BUN: 17 mg/dL (ref 6–23)
CO2: 23 meq/L (ref 19–32)
Calcium: 9.6 mg/dL (ref 8.4–10.5)
Chloride: 111 mEq/L (ref 96–112)
Creatinine, Ser: 1.61 mg/dL — ABNORMAL HIGH (ref 0.50–1.35)
GFR calc non Af Amer: 44 mL/min — ABNORMAL LOW (ref >90)
GFR, EST AFRICAN AMERICAN: 51 mL/min — AB (ref >90)
Glucose, Bld: 147 mg/dL — ABNORMAL HIGH (ref 70–99)
Potassium: 3.6 mEq/L — ABNORMAL LOW (ref 3.7–5.3)
SODIUM: 148 meq/L — AB (ref 137–147)

## 2014-05-11 LAB — URINALYSIS, ROUTINE W REFLEX MICROSCOPIC
Bilirubin Urine: NEGATIVE
GLUCOSE, UA: NEGATIVE mg/dL
Hgb urine dipstick: NEGATIVE
Ketones, ur: NEGATIVE mg/dL
LEUKOCYTES UA: NEGATIVE
NITRITE: NEGATIVE
PROTEIN: NEGATIVE mg/dL
Specific Gravity, Urine: 1.013 (ref 1.005–1.030)
Urobilinogen, UA: 0.2 mg/dL (ref 0.0–1.0)
pH: 5.5 (ref 5.0–8.0)

## 2014-05-11 LAB — GLUCOSE, CAPILLARY
GLUCOSE-CAPILLARY: 121 mg/dL — AB (ref 70–99)
Glucose-Capillary: 174 mg/dL — ABNORMAL HIGH (ref 70–99)

## 2014-05-11 LAB — PROTIME-INR
INR: 1.01 (ref 0.00–1.49)
Prothrombin Time: 13.3 seconds (ref 11.6–15.2)

## 2014-05-11 LAB — APTT: aPTT: 25 seconds (ref 24–37)

## 2014-05-11 MED ORDER — MIDAZOLAM HCL 2 MG/2ML IJ SOLN
INTRAMUSCULAR | Status: AC
  Filled 2014-05-11: qty 2

## 2014-05-11 MED ORDER — FENTANYL CITRATE 0.05 MG/ML IJ SOLN
INTRAMUSCULAR | Status: AC
  Filled 2014-05-11: qty 2

## 2014-05-11 MED ORDER — HYDROCODONE-ACETAMINOPHEN 5-325 MG PO TABS: 1.0000 | ORAL_TABLET | ORAL | Status: DC | PRN

## 2014-05-11 MED ORDER — IOHEXOL 300 MG/ML  SOLN
150.0000 mL | Freq: Once | INTRAMUSCULAR | Status: AC | PRN
  Administered 2014-05-11: 75 mL via INTRA_ARTERIAL

## 2014-05-11 MED ORDER — HEPARIN SOD (PORK) LOCK FLUSH 100 UNIT/ML IV SOLN
INTRAVENOUS | Status: AC
  Filled 2014-05-11: qty 5

## 2014-05-11 MED ORDER — LIDOCAINE HCL 1 % IJ SOLN
INTRAMUSCULAR | Status: AC
  Filled 2014-05-11: qty 20

## 2014-05-11 MED ORDER — SODIUM CHLORIDE 0.9 % IV SOLN
INTRAVENOUS | Status: DC
  Administered 2014-05-11: 15:00:00 via INTRAVENOUS

## 2014-05-11 MED ORDER — MIDAZOLAM HCL 2 MG/2ML IJ SOLN
INTRAMUSCULAR | Status: AC | PRN
  Administered 2014-05-11: 0.5 mg via INTRAVENOUS

## 2014-05-11 MED ORDER — FENTANYL CITRATE 0.05 MG/ML IJ SOLN
INTRAMUSCULAR | Status: AC | PRN
  Administered 2014-05-11: 25 ug via INTRAVENOUS

## 2014-05-11 MED ORDER — HEPARIN SOD (PORK) LOCK FLUSH 100 UNIT/ML IV SOLN
INTRAVENOUS | Status: AC | PRN
  Administered 2014-05-11: 2000 [IU] via INTRAVENOUS

## 2014-05-11 MED ORDER — HEPARIN SODIUM (PORCINE) 1000 UNIT/ML IJ SOLN
INTRAMUSCULAR | Status: DC
Start: 2014-05-11 — End: 2014-05-11
  Filled 2014-05-11: qty 5

## 2014-05-11 NOTE — Sedation Documentation (Signed)
Right groin C/D/I, without hematoma or bleeding at this time. +3 pulses right pedal and post-tib.

## 2014-05-11 NOTE — Sedation Documentation (Signed)
100% SpO2 on RA.

## 2014-05-11 NOTE — Sedation Documentation (Signed)
Patient denies pain and is resting comfortably.  

## 2014-05-11 NOTE — Brief Op Note (Signed)
PREOP DX: Acom aneurysm s/p clipping  POSTOP DX: Same  PROCEDURE: Diagnostic cerebral angiogram  SURGEON: Dr. Consuella Lose, MD  ANESTHESIA: IV Sedation with Local  EBL: Minimal  SPECIMENS: None  COMPLICATIONS: None  CONDITION: Stable to recovery  FINDINGS: 1. 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

## 2014-05-11 NOTE — Sedation Documentation (Signed)
Bilateral +3 pulses, post-tib and pedal.

## 2014-05-11 NOTE — H&P (Signed)
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 He states that he feels very well, and has occasional frontal headaches maybe every other day. He is otherwise doing well. His case manager states that he is actually more active than he was prior to his subarachnoid hemorrhage, interacting more with other residents, and doing better watching his weight.   He presents today for elective f/u diagnostic cerebral angiogram.   PMH: Past Medical History  Diagnosis Date  . Diabetes mellitus without complication   . Mental disorder     schizoaffective  . Hypertension   . Chronic kidney disease     renal insufficiency    PSH: Past Surgical History  Procedure Laterality Date  . Ankle fracture surgery    . Knee surgery      due to fracture.   . Craniotomy Left 10/27/2013    Procedure: Craniotomy for Aneurysm Clipping;  Surgeon: Consuella Lose, MD;  Location: [facility];  Service: Neurosurgery;  Laterality: Left;    SH: History  Substance Use Topics  . Smoking status: Current Every Day Smoker -- 1.00 packs/day  . Smokeless tobacco: Not on file  . Alcohol Use: Not on file    MEDS: Prior to Admission medications   Medication Sig Start Date End Date Taking? Authorizing Provider  acetaminophen (TYLENOL) 500 MG tablet Take 500 mg by mouth every 6 (six) hours as needed (pain).   Yes Historical Provider, MD  amLODipine-benazepril (LOTREL) 10-40 MG per capsule Take 1 capsule by mouth daily.   Yes Historical Provider, MD  atenolol (TENORMIN) 25 MG tablet Take 25 mg by mouth at bedtime.    Yes Historical Provider, MD  calcium-vitamin D (OSCAL WITH D) 500-200 MG-UNIT per tablet Take 1 tablet by mouth 2 (two) times daily.   Yes Historical Provider, MD   fenofibrate (TRICOR) 145 MG tablet Take 145 mg by mouth daily.   Yes Historical Provider, MD  insulin lispro protamine-lispro (HUMALOG 75/25 MIX) (75-25) 100 UNIT/ML SUSP injection Inject 8 Units into the skin 2 (two) times daily with a meal. If above 159 increase by 2 units of insulin   Yes Historical Provider, MD  lamoTRIgine (LAMICTAL) 100 MG tablet Take 100 mg by mouth 2 (two) times daily.   Yes Historical Provider, MD  omeprazole (PRILOSEC) 20 MG capsule Take 20 mg by mouth daily.   Yes Historical Provider, MD  senna (SENOKOT) 8.6 MG TABS tablet Take 1 tablet by mouth every 6 (six) hours as needed for mild constipation.   Yes Historical Provider, MD  valsartan-hydrochlorothiazide (DIOVAN-HCT) 320-25 MG per tablet Take 1 tablet by mouth daily.   Yes Historical Provider, MD  ziprasidone (GEODON) 40 MG capsule Take 40-80 mg by mouth 2 (two) times daily with a meal. Take 40 mg in the morning and 80 mg in the evening   Yes Historical Provider, MD    ALLERGY: No Known Allergies  ROS: ROS  NEUROLOGIC EXAM: Awake, alert, oriented Memory and concentration grossly intact Speech fluent, appropriate CN grossly intact Motor exam: Upper Extremities Deltoid Bicep Tricep Grip  Right 5/5 5/5 5/5 5/5  Left 5/5 5/5 5/5 5/5   Lower Extremity IP Quad PF DF EHL  Right 5/5 5/5 5/5 5/5 5/5  Left 5/5 5/5 5/5 5/5 5/5  Sensation grossly intact to LT  IMPRESSION: - 63 y.o. male 5 months s/p clipping of ruptured Acom aneurysm, recovered well.  PLAN: - Diagnostic cerebral angiogram - Likely home post-procedure

## 2014-05-11 NOTE — Discharge Instructions (Signed)

## 2016-09-03 DIAGNOSIS — I129 Hypertensive chronic kidney disease with stage 1 through stage 4 chronic kidney disease, or unspecified chronic kidney disease: Secondary | ICD-10-CM | POA: Diagnosis not present

## 2016-09-03 DIAGNOSIS — Z794 Long term (current) use of insulin: Secondary | ICD-10-CM | POA: Diagnosis not present

## 2016-09-03 DIAGNOSIS — E1122 Type 2 diabetes mellitus with diabetic chronic kidney disease: Secondary | ICD-10-CM | POA: Diagnosis not present

## 2016-09-11 DIAGNOSIS — F251 Schizoaffective disorder, depressive type: Secondary | ICD-10-CM | POA: Diagnosis not present

## 2016-09-11 DIAGNOSIS — F411 Generalized anxiety disorder: Secondary | ICD-10-CM | POA: Diagnosis not present

## 2016-12-06 DIAGNOSIS — F411 Generalized anxiety disorder: Secondary | ICD-10-CM | POA: Diagnosis not present

## 2016-12-06 DIAGNOSIS — F251 Schizoaffective disorder, depressive type: Secondary | ICD-10-CM | POA: Diagnosis not present

## 2017-01-01 DIAGNOSIS — E119 Type 2 diabetes mellitus without complications: Secondary | ICD-10-CM | POA: Diagnosis not present

## 2017-01-01 DIAGNOSIS — Z794 Long term (current) use of insulin: Secondary | ICD-10-CM | POA: Diagnosis not present

## 2017-01-01 DIAGNOSIS — E782 Mixed hyperlipidemia: Secondary | ICD-10-CM | POA: Diagnosis not present

## 2017-03-04 DIAGNOSIS — F411 Generalized anxiety disorder: Secondary | ICD-10-CM | POA: Diagnosis not present

## 2017-03-04 DIAGNOSIS — F251 Schizoaffective disorder, depressive type: Secondary | ICD-10-CM | POA: Diagnosis not present

## 2017-04-17 DIAGNOSIS — Z794 Long term (current) use of insulin: Secondary | ICD-10-CM | POA: Diagnosis not present

## 2017-04-17 DIAGNOSIS — E119 Type 2 diabetes mellitus without complications: Secondary | ICD-10-CM | POA: Diagnosis not present

## 2017-05-27 DIAGNOSIS — F251 Schizoaffective disorder, depressive type: Secondary | ICD-10-CM | POA: Diagnosis not present

## 2017-05-27 DIAGNOSIS — F411 Generalized anxiety disorder: Secondary | ICD-10-CM | POA: Diagnosis not present

## 2017-06-24 DIAGNOSIS — I119 Hypertensive heart disease without heart failure: Secondary | ICD-10-CM | POA: Diagnosis not present

## 2017-06-24 DIAGNOSIS — K5900 Constipation, unspecified: Secondary | ICD-10-CM | POA: Diagnosis not present

## 2017-06-24 DIAGNOSIS — Z794 Long term (current) use of insulin: Secondary | ICD-10-CM | POA: Diagnosis not present

## 2017-06-24 DIAGNOSIS — E119 Type 2 diabetes mellitus without complications: Secondary | ICD-10-CM | POA: Diagnosis not present

## 2017-06-24 DIAGNOSIS — N401 Enlarged prostate with lower urinary tract symptoms: Secondary | ICD-10-CM | POA: Diagnosis not present

## 2017-06-24 DIAGNOSIS — K210 Gastro-esophageal reflux disease with esophagitis: Secondary | ICD-10-CM | POA: Diagnosis not present

## 2017-08-23 DIAGNOSIS — F251 Schizoaffective disorder, depressive type: Secondary | ICD-10-CM | POA: Diagnosis not present

## 2017-09-10 DIAGNOSIS — E113212 Type 2 diabetes mellitus with mild nonproliferative diabetic retinopathy with macular edema, left eye: Secondary | ICD-10-CM | POA: Diagnosis not present

## 2017-10-15 DIAGNOSIS — E113212 Type 2 diabetes mellitus with mild nonproliferative diabetic retinopathy with macular edema, left eye: Secondary | ICD-10-CM | POA: Diagnosis not present

## 2017-10-15 DIAGNOSIS — E113291 Type 2 diabetes mellitus with mild nonproliferative diabetic retinopathy without macular edema, right eye: Secondary | ICD-10-CM | POA: Diagnosis not present

## 2017-11-19 DIAGNOSIS — E113212 Type 2 diabetes mellitus with mild nonproliferative diabetic retinopathy with macular edema, left eye: Secondary | ICD-10-CM | POA: Diagnosis not present

## 2017-12-24 DIAGNOSIS — E113212 Type 2 diabetes mellitus with mild nonproliferative diabetic retinopathy with macular edema, left eye: Secondary | ICD-10-CM | POA: Diagnosis not present

## 2017-12-24 DIAGNOSIS — I10 Essential (primary) hypertension: Secondary | ICD-10-CM | POA: Diagnosis not present

## 2017-12-24 DIAGNOSIS — E1169 Type 2 diabetes mellitus with other specified complication: Secondary | ICD-10-CM | POA: Diagnosis not present

## 2018-01-26 DIAGNOSIS — I10 Essential (primary) hypertension: Secondary | ICD-10-CM | POA: Diagnosis not present

## 2018-02-04 DIAGNOSIS — E113291 Type 2 diabetes mellitus with mild nonproliferative diabetic retinopathy without macular edema, right eye: Secondary | ICD-10-CM | POA: Diagnosis not present

## 2018-02-04 DIAGNOSIS — E113212 Type 2 diabetes mellitus with mild nonproliferative diabetic retinopathy with macular edema, left eye: Secondary | ICD-10-CM | POA: Diagnosis not present

## 2018-02-10 DIAGNOSIS — F251 Schizoaffective disorder, depressive type: Secondary | ICD-10-CM | POA: Diagnosis not present

## 2018-03-17 DIAGNOSIS — E113212 Type 2 diabetes mellitus with mild nonproliferative diabetic retinopathy with macular edema, left eye: Secondary | ICD-10-CM | POA: Diagnosis not present

## 2018-04-29 DIAGNOSIS — E113212 Type 2 diabetes mellitus with mild nonproliferative diabetic retinopathy with macular edema, left eye: Secondary | ICD-10-CM | POA: Diagnosis not present

## 2018-05-06 DIAGNOSIS — F251 Schizoaffective disorder, depressive type: Secondary | ICD-10-CM | POA: Diagnosis not present

## 2018-05-09 DIAGNOSIS — I10 Essential (primary) hypertension: Secondary | ICD-10-CM | POA: Diagnosis not present

## 2018-05-09 DIAGNOSIS — E119 Type 2 diabetes mellitus without complications: Secondary | ICD-10-CM | POA: Diagnosis not present

## 2018-06-24 DIAGNOSIS — E113212 Type 2 diabetes mellitus with mild nonproliferative diabetic retinopathy with macular edema, left eye: Secondary | ICD-10-CM | POA: Diagnosis not present

## 2018-07-04 ENCOUNTER — Other Ambulatory Visit: Payer: Self-pay

## 2018-07-08 ENCOUNTER — Ambulatory Visit: Payer: Self-pay | Admitting: Urology

## 2018-08-06 DIAGNOSIS — F251 Schizoaffective disorder, depressive type: Secondary | ICD-10-CM | POA: Diagnosis not present

## 2018-09-19 DIAGNOSIS — E1122 Type 2 diabetes mellitus with diabetic chronic kidney disease: Secondary | ICD-10-CM | POA: Diagnosis not present

## 2018-10-06 DIAGNOSIS — E113293 Type 2 diabetes mellitus with mild nonproliferative diabetic retinopathy without macular edema, bilateral: Secondary | ICD-10-CM | POA: Diagnosis not present

## 2018-10-16 DIAGNOSIS — R809 Proteinuria, unspecified: Secondary | ICD-10-CM | POA: Diagnosis not present

## 2018-10-16 DIAGNOSIS — E1129 Type 2 diabetes mellitus with other diabetic kidney complication: Secondary | ICD-10-CM | POA: Diagnosis not present

## 2018-10-16 DIAGNOSIS — I10 Essential (primary) hypertension: Secondary | ICD-10-CM | POA: Diagnosis not present

## 2018-10-23 ENCOUNTER — Other Ambulatory Visit: Payer: Self-pay | Admitting: Nephrology

## 2018-10-23 DIAGNOSIS — R809 Proteinuria, unspecified: Secondary | ICD-10-CM

## 2018-10-23 DIAGNOSIS — I10 Essential (primary) hypertension: Secondary | ICD-10-CM

## 2018-11-05 DIAGNOSIS — F251 Schizoaffective disorder, depressive type: Secondary | ICD-10-CM | POA: Diagnosis not present

## 2018-11-24 ENCOUNTER — Ambulatory Visit
Admission: RE | Admit: 2018-11-24 | Discharge: 2018-11-24 | Disposition: A | Discharge status: Home / Self Care | Payer: Medicare Other | Source: Ambulatory Visit | Attending: Nephrology | Admitting: Nephrology

## 2018-11-24 ENCOUNTER — Other Ambulatory Visit: Payer: Self-pay

## 2018-11-24 DIAGNOSIS — R809 Proteinuria, unspecified: Secondary | ICD-10-CM | POA: Diagnosis not present

## 2018-11-24 DIAGNOSIS — I10 Essential (primary) hypertension: Secondary | ICD-10-CM

## 2018-11-25 ENCOUNTER — Other Ambulatory Visit (HOSPITAL_COMMUNITY): Payer: Self-pay | Admitting: Nephrology

## 2018-11-25 ENCOUNTER — Other Ambulatory Visit: Payer: Self-pay | Admitting: Nephrology

## 2018-11-25 DIAGNOSIS — C642 Malignant neoplasm of left kidney, except renal pelvis: Secondary | ICD-10-CM

## 2018-11-27 ENCOUNTER — Ambulatory Visit
Admission: RE | Admit: 2018-11-27 | Discharge: 2018-11-27 | Disposition: A | Discharge status: Home / Self Care | Payer: Medicare Other | Source: Ambulatory Visit | Attending: Nephrology | Admitting: Nephrology

## 2018-11-27 ENCOUNTER — Other Ambulatory Visit: Payer: Self-pay | Admitting: Nephrology

## 2018-11-27 ENCOUNTER — Other Ambulatory Visit: Payer: Self-pay

## 2018-11-27 DIAGNOSIS — C642 Malignant neoplasm of left kidney, except renal pelvis: Secondary | ICD-10-CM | POA: Insufficient documentation

## 2018-11-27 DIAGNOSIS — D49512 Neoplasm of unspecified behavior of left kidney: Secondary | ICD-10-CM | POA: Diagnosis not present

## 2018-11-27 LAB — POCT I-STAT CREATININE: Creatinine, Ser: 2.3 mg/dL — ABNORMAL HIGH (ref 0.61–1.24)

## 2018-12-01 DIAGNOSIS — R05 Cough: Secondary | ICD-10-CM | POA: Diagnosis not present

## 2018-12-01 DIAGNOSIS — I10 Essential (primary) hypertension: Secondary | ICD-10-CM | POA: Diagnosis not present

## 2018-12-01 DIAGNOSIS — Z20828 Contact with and (suspected) exposure to other viral communicable diseases: Secondary | ICD-10-CM | POA: Diagnosis not present

## 2018-12-18 DIAGNOSIS — Z20828 Contact with and (suspected) exposure to other viral communicable diseases: Secondary | ICD-10-CM | POA: Diagnosis not present

## 2018-12-18 DIAGNOSIS — I10 Essential (primary) hypertension: Secondary | ICD-10-CM | POA: Diagnosis not present

## 2018-12-18 DIAGNOSIS — R05 Cough: Secondary | ICD-10-CM | POA: Diagnosis not present

## 2018-12-29 DIAGNOSIS — R05 Cough: Secondary | ICD-10-CM | POA: Diagnosis not present

## 2018-12-29 DIAGNOSIS — Z20828 Contact with and (suspected) exposure to other viral communicable diseases: Secondary | ICD-10-CM | POA: Diagnosis not present

## 2018-12-29 DIAGNOSIS — I10 Essential (primary) hypertension: Secondary | ICD-10-CM | POA: Diagnosis not present

## 2019-01-13 ENCOUNTER — Ambulatory Visit (INDEPENDENT_AMBULATORY_CARE_PROVIDER_SITE_OTHER): Payer: Medicare Other | Admitting: Urology

## 2019-01-13 ENCOUNTER — Other Ambulatory Visit: Payer: Self-pay

## 2019-01-13 DIAGNOSIS — N2889 Other specified disorders of kidney and ureter: Secondary | ICD-10-CM

## 2019-01-13 NOTE — Progress Notes (Signed)
Virtual Visit via Video Note  I connected with Riley Stuart on 01/13/19 at 10:30 AM EDT by a video enabled telemedicine application and verified that I am speaking with the correct person using two identifiers.  Location: Patient: Group home, accompanied by caretaker Provider: home   I discussed the limitations of evaluation and management by telemedicine and the availability of in person appointments. The patient expressed understanding and agreed to proceed.  History of Present Illness: Pleasant 68 year old male referred for further evaluation of a left lower pole renal mass by Dr. Juleen China.  Riley Stuart was referred for further evaluation of proteinuria in the setting of diabetes and CKD.  As part of his evaluation in nephrology, he underwent a renal ultrasound suggestive of possible left lower pole renal mass.  This was followed up with a noncontrast CT scan in light of CKD which revealed a 6.8 cm solid-appearing left lower pole renal mass without any obvious adenopathy or obstruction.  He does have multiple lesions in both kidneys, presumably related various cystic lesions based on appearance on renal ultrasound.  This particular left lower pole renal lesion was not appreciated on CT scan from 2015 although this was also limited without contrast.  No flank pain or weight loss.  No gross hematuria.  PMH: DM II HTN Tobacco abuse Schizophrenia   Meds: Acetaminophen 650 as needed Amlodipine 10 mg daily Carvedilol 25 mg daily Fenofibrate daily Furosemide 10 mg daily valbenzine 40 mg daily Lamictal 100 mg twice daily NovoLog 70/30 30 units every morning Omeprazole 20 mg daily Multivitamin Flomax 0.4 mg daily Valsartan/hydrochlorothiazide 320/25 mg daily siprasidone 160 mg qhs  Cr 2.21  Observations/Objective: Extremely pleasant but with somewhat slow speech, able to answer questions appropriately but insight seems slightly impaired.  Accompanied by caretaker today  who is helping to facilitate the conversation.  Assessment and Plan:  1. Left renal mass A solid renal mass raises the suspicion of primary renal malignancy.  We discussed this in detail and in regards to the spectrum of renal masses which includes cysts (pure cysts are considered benign), solid masses and everything in between. The risk of metastasis increases as the size of solid renal mass increases. In general, it is believed that the risk of metastasis for renal masses less than 3-4 cm is small (up to approximately 5%) based mainly on large retrospective studies.  In some cases and especially in patients of older age and multiple comorbidities a surveillance approach may be appropriate. The treatment of solid renal masses includes: surveillance, cryoablation (percutaneous and laparoscopic) in addition to partial and complete nephrectomy (each with option of laparoscopic, robotic and open depending on appropriateness). Furthermore, nephrectomy appears to be an independent risk factor for the development of chronic kidney disease suggesting that nephron sparing approaches should be implored whenever feasible. We reviewed these options in context of the patients current situation as well as the pros and cons of each.  At this point in time, without the presence of IV contrast on the studies, the evaluation is somewhat limited although quite suspicious especially given what appears to be interval development of this relatively large lesion over the past 5 years.  Otherwise asymptomatic.  We discussed options including noncontrast MR for further characterization as well as renal mass biopsy which may be helpful in obtaining a diagnosis in order to guide management.  He is interested in doing a renal mass biopsy.  We discussed the procedure at length today including the risks of the procedure including  nondiagnostic biopsy, bleeding, infection, discomfort, and theoretical risk of seeding of the tract  (appears to be no longer an issue with current technique).  We will plan on biopsy and follow-up with me thereafter to discuss options.  If this is in fact renal cell carcinoma, he will likely need further staging with CMP, chest imaging to rule out metastatic disease.  He is agreeable this plan.  2. CKD, stage III Unable to receive IV contrast Prefer nephron sparing surgery in light of baseline elevated creatinine, concern for progression of medical renal disease  Follow Up Instructions: F/u after renal mass biopsy  I discussed the assessment and treatment plan with the patient. The patient was provided an opportunity to ask questions and all were answered. The patient agreed with the plan and demonstrated an understanding of the instructions.   The patient was advised to call back or seek an in-person evaluation if the symptoms worsen or if the condition fails to improve as anticipated.  I provided 27  minutes of non-face-to-face time during this encounter.   Hollice Espy, MD

## 2019-01-20 ENCOUNTER — Other Ambulatory Visit: Payer: Self-pay | Admitting: Student

## 2019-01-20 NOTE — Progress Notes (Signed)
Spoke with patient's caregiver at [facility] regarding appointment for tomorrow,ie Renal biopsy scheduled with questions answered.aware to only take 1/2 of insulin dose this pm and NPO after midnight. Will plan to take BP meds in am with sip of water prior to coming for procedure.

## 2019-01-21 ENCOUNTER — Other Ambulatory Visit: Payer: Self-pay

## 2019-01-21 ENCOUNTER — Ambulatory Visit
Admission: RE | Admit: 2019-01-21 | Discharge: 2019-01-21 | Disposition: A | Discharge status: Home / Self Care | Payer: Medicare Other | Source: Ambulatory Visit | Attending: Urology | Admitting: Urology

## 2019-01-21 DIAGNOSIS — N189 Chronic kidney disease, unspecified: Secondary | ICD-10-CM | POA: Insufficient documentation

## 2019-01-21 DIAGNOSIS — Z79899 Other long term (current) drug therapy: Secondary | ICD-10-CM | POA: Diagnosis not present

## 2019-01-21 DIAGNOSIS — I129 Hypertensive chronic kidney disease with stage 1 through stage 4 chronic kidney disease, or unspecified chronic kidney disease: Secondary | ICD-10-CM | POA: Diagnosis not present

## 2019-01-21 DIAGNOSIS — F17210 Nicotine dependence, cigarettes, uncomplicated: Secondary | ICD-10-CM | POA: Diagnosis not present

## 2019-01-21 DIAGNOSIS — Z7901 Long term (current) use of anticoagulants: Secondary | ICD-10-CM | POA: Diagnosis not present

## 2019-01-21 DIAGNOSIS — N2889 Other specified disorders of kidney and ureter: Secondary | ICD-10-CM | POA: Diagnosis not present

## 2019-01-21 DIAGNOSIS — Z794 Long term (current) use of insulin: Secondary | ICD-10-CM | POA: Diagnosis not present

## 2019-01-21 DIAGNOSIS — E1122 Type 2 diabetes mellitus with diabetic chronic kidney disease: Secondary | ICD-10-CM | POA: Diagnosis not present

## 2019-01-21 DIAGNOSIS — C642 Malignant neoplasm of left kidney, except renal pelvis: Secondary | ICD-10-CM | POA: Diagnosis not present

## 2019-01-21 LAB — PROTIME-INR
INR: 1 (ref 0.8–1.2)
Prothrombin Time: 13.4 seconds (ref 11.4–15.2)

## 2019-01-21 LAB — CBC
HCT: 35.8 % — ABNORMAL LOW (ref 39.0–52.0)
Hemoglobin: 12 g/dL — ABNORMAL LOW (ref 13.0–17.0)
MCH: 29.9 pg (ref 26.0–34.0)
MCHC: 33.5 g/dL (ref 30.0–36.0)
MCV: 89.1 fL (ref 80.0–100.0)
Platelets: 427 10*3/uL — ABNORMAL HIGH (ref 150–400)
RBC: 4.02 MIL/uL — ABNORMAL LOW (ref 4.22–5.81)
RDW: 15.9 % — ABNORMAL HIGH (ref 11.5–15.5)
WBC: 6.7 10*3/uL (ref 4.0–10.5)
nRBC: 0 % (ref 0.0–0.2)

## 2019-01-21 LAB — APTT: aPTT: 35 seconds (ref 24–36)

## 2019-01-21 LAB — GLUCOSE, CAPILLARY: Glucose-Capillary: 128 mg/dL — ABNORMAL HIGH (ref 70–99)

## 2019-01-21 MED ORDER — MIDAZOLAM HCL 5 MG/5ML IJ SOLN
INTRAMUSCULAR | Status: AC
  Filled 2019-01-21: qty 5

## 2019-01-21 MED ORDER — MIDAZOLAM HCL 2 MG/2ML IJ SOLN
INTRAMUSCULAR | Status: AC | PRN
  Administered 2019-01-21 (×2): 1 mg via INTRAVENOUS

## 2019-01-21 MED ORDER — FENTANYL CITRATE (PF) 100 MCG/2ML IJ SOLN
INTRAMUSCULAR | Status: AC
  Filled 2019-01-21: qty 4

## 2019-01-21 MED ORDER — SODIUM CHLORIDE 0.9 % IV SOLN
INTRAVENOUS | Status: DC
  Administered 2019-01-21: 09:00:00 via INTRAVENOUS

## 2019-01-21 MED ORDER — FENTANYL CITRATE (PF) 100 MCG/2ML IJ SOLN
INTRAMUSCULAR | Status: AC | PRN
  Administered 2019-01-21 (×2): 50 ug via INTRAVENOUS

## 2019-01-21 NOTE — H&P (Signed)
Chief Complaint: Patient was seen in consultation today for No chief complaint on file.  at the request of [place] [facility]  Referring Physician(s): Hollice Espy  Supervising Physician: Corrie Mckusick  Patient Status: [facility] - Out-pt  History of Present Illness: Riley Stuart is a 68 y.o. male presenting with left renal mass, for biopsy.    He denies any symptoms of cough, fever, rigors, chills, URI/LRI.    Past Medical History:  Diagnosis Date  . Chronic kidney disease    renal insufficiency  . Diabetes mellitus without complication ([name])    Pt takes Insulin  . Hypertension   . Mental disorder    schizoaffective    Past Surgical History:  Procedure Laterality Date  . ANKLE FRACTURE SURGERY    . CRANIOTOMY Left 10/27/2013   Procedure: Craniotomy for Aneurysm Clipping;  Surgeon: Consuella Lose, MD;  Location: [facility];  Service: Neurosurgery;  Laterality: Left;  . KNEE SURGERY     due to fracture.     Allergies: Patient has no known allergies.  Medications: Prior to Admission medications   Medication Sig Start Date End Date Taking? Authorizing Provider  acetaminophen (TYLENOL) 500 MG tablet Take 500 mg by mouth every 6 (six) hours as needed (pain).   Yes [provider]  amLODipine-benazepril (LOTREL) 10-40 MG per capsule Take 1 capsule by mouth daily.   Yes [provider]  atenolol (TENORMIN) 25 MG tablet Take 25 mg by mouth at bedtime.    Yes [provider]  calcium-vitamin D (OSCAL WITH D) 500-200 MG-UNIT per tablet Take 1 tablet by mouth 2 (two) times daily.   Yes [provider]  fenofibrate (TRICOR) 145 MG tablet Take 145 mg by mouth daily.   Yes [provider]  insulin lispro protamine-lispro (HUMALOG 75/25 MIX) (75-25) 100 UNIT/ML SUSP injection Inject 8 Units into the skin 2 (two) times daily with a meal. If above 159 increase by 2 units of insulin   Yes [provider]  lamoTRIgine  (LAMICTAL) 100 MG tablet Take 100 mg by mouth 2 (two) times daily.   Yes [provider]  omeprazole (PRILOSEC) 20 MG capsule Take 20 mg by mouth daily.   Yes [provider]  senna (SENOKOT) 8.6 MG TABS tablet Take 1 tablet by mouth every 6 (six) hours as needed for mild constipation.   Yes [provider]  valsartan-hydrochlorothiazide (DIOVAN-HCT) 320-25 MG per tablet Take 1 tablet by mouth daily.   Yes [provider]  ziprasidone (GEODON) 40 MG capsule Take 40-80 mg by mouth 2 (two) times daily with a meal. Take 40 mg in the morning and 80 mg in the evening   Yes [provider]     No family history on file.  Social History   Socioeconomic History  . Marital status: Unknown    Spouse name: Not on file  . Number of children: Not on file  . Years of education: Not on file  . Highest education level: Not on file  Occupational History  . Not on file  Social Needs  . Financial resource strain: Not on file  . Food insecurity:    Worry: Not on file    Inability: Not on file  . Transportation needs:    Medical: Not on file    Non-medical: Not on file  Tobacco Use  . Smoking status: Current Every Day Smoker    Packs/day: 1.00  Substance and Sexual Activity  . Alcohol use: Not on file  .  Drug use: Not on file  . Sexual activity: Not on file  Lifestyle  . Physical activity:    Days per week: Not on file    Minutes per session: Not on file  . Stress: Not on file  Relationships  . Social connections:    Talks on phone: Not on file    Gets together: Not on file    Attends religious service: Not on file    Active member of club or organization: Not on file    Attends meetings of clubs or organizations: Not on file    Relationship status: Not on file  Other Topics Concern  . Not on file  Social History Narrative  . Not on file    ECOG Status: 0 - Asymptomatic  Review of Systems: A 12 point ROS discussed and pertinent positives  are indicated in the HPI above.  All other systems are negative.  Review of Systems  Vital Signs: BP (!) 164/83 (BP Location: Right Leg)   Pulse (!) 44   Temp 98.2 F (36.8 C) (Oral)   Resp 11   Ht 6\' 1"  (1.854 m)   Wt 104.3 kg   SpO2 100%   BMI 30.34 kg/m   Physical Exam General: 68 yo male appearing  stated age.  Well-developed, well-nourished.  No distress. HEENT: Atraumatic, normocephalic.  Conjugate gaze, extra-ocular motor intact. No scleral icterus or scleral injection. No lesions on external ears, nose, lips, or gums.  Oral mucosa moist, pink.  Neck: Symmetric with no goiter enlargement.  Chest/Lungs:  Symmetric chest with inspiration/expiration.  No labored breathing.  Clear to auscultation with no wheezes, rhonchi, or rales.  Heart:  RRR, with no third heart sounds appreciated. No JVD appreciated.  Abdomen:  Soft, NT/ND, with + bowel sounds.   Genito-urinary: Deferred Neurologic: Alert & Oriented to person, place, and time.   Normal affect and insight.  Appropriate questions.  Moving all 4 extremities with gross sensory intact.     Imaging: No results found.  Labs:  CBC: Recent Labs    01/21/19 0823  WBC 6.7  HGB 12.0*  HCT 35.8*  PLT 427*    COAGS: Recent Labs    01/21/19 0823  INR 1.0  APTT 35    BMP: Recent Labs    11/27/18 0936  CREATININE 2.30*    LIVER FUNCTION TESTS: No results for input(s): BILITOT, AST, ALT, ALKPHOS, PROT, ALBUMIN in the last 8760 hours.  TUMOR MARKERS: No results for input(s): AFPTM, CEA, CA199, CHROMGRNA in the last 8760 hours.  Assessment and Plan:  Riley Stuart is 68 year old male presenting for US guided biopsy of left renal mass.    Risks and benefits of renal biopsy was discussed with the patient  including, but not limited to bleeding, infection, damage to adjacent structures or low yield requiring additional tests.  All of the questions were answered and there is agreement to proceed.   Consent signed and in chart.  Thank you for this interesting consult.  I greatly enjoyed meeting [employer] and look forward to participating in their care.  A copy of this report was sent to the requesting provider on this date.  Electronically Signed: Corrie Mckusick, DO 01/21/2019, 9:45 AM   I spent a total of  40 Minutes   in face to face in clinical consultation, greater than 50% of which was counseling/coordinating care for renal mass, US guided biopsy.

## 2019-01-21 NOTE — Discharge Instructions (Signed)
Moderate Conscious Sedation, Adult, Care After These instructions provide you with information about caring for yourself after your procedure. Your health care provider may also give you more specific instructions. Your treatment has been planned according to current medical practices, but problems sometimes occur. Call your health care provider if you have any problems or questions after your procedure. What can I expect after the procedure? After your procedure, it is common:  To feel sleepy for several hours.  To feel clumsy and have poor balance for several hours.  To have poor judgment for several hours.  To vomit if you eat too soon. Follow these instructions at home: For at least 24 hours after the procedure:   Do not: ? Participate in activities where you could fall or become injured. ? Drive. ? Use heavy machinery. ? Drink alcohol. ? Take sleeping pills or medicines that cause drowsiness. ? Make important decisions or sign legal documents. ? Take care of children on your own.  Rest. Eating and drinking  Follow the diet recommended by your health care provider.  If you vomit: ? Drink water, juice, or soup when you can drink without vomiting. ? Make sure you have little or no nausea before eating solid foods. General instructions  Have a responsible adult stay with you until you are awake and alert.  Take over-the-counter and prescription medicines only as told by your health care provider.  If you smoke, do not smoke without supervision.  Keep all follow-up visits as told by your health care provider. This is important. Contact a health care provider if:  You keep feeling nauseous or you keep vomiting.  You feel light-headed.  You develop a rash.  You have a fever. Get help right away if:  You have trouble breathing. This information is not intended to replace advice given to you by your health care provider. Make sure you discuss any questions you have  with your health care provider. Document Released: 06/03/2013 Document Revised: 01/16/2016 Document Reviewed: 12/03/2015 Elsevier Interactive Patient Education  2019 [employer] neous Kidney Biopsy, Care After This sheet gives you information about how to care for yourself after your procedure. Your health care provider may also give you more specific instructions. If you have problems or questions, contact your health care provider. What can I expect after the procedure? After the procedure, it is common to have:  Pain or soreness near the area where the needle went through your skin (biopsy site).  Bright pink or cloudy urine for 24 hours after the procedure. Follow these instructions at home: Activity  Return to your normal activities as told by your health care provider. Ask your health care provider what activities are safe for you.  Do not drive for 24 hours if you were given a medicine to help you relax (sedative).  Do not lift anything that is heavier than 10 lb (4.5 kg) until your health care provider tells you that it is safe.  Avoid activities that take a lot of effort (are strenuous) until your health care provider approves. Most people will have to wait 2 weeks before returning to activities such as exercise or sexual intercourse. General instructions   Take over-the-counter and prescription medicines only as told by your health care provider.  You may eat and drink after your procedure. Follow instructions from your health care provider about eating or drinking restrictions.  Check your biopsy site every day for signs of infection. Check for: ? More redness, swelling, or pain. ?  More fluid or blood. ? Warmth. ? Pus or a bad smell.  Keep all follow-up visits as told by your health care provider. This is important. Contact a health care provider if:  You have more redness, swelling, or pain around your biopsy site.  You have more fluid or blood coming from your  biopsy site.  Your biopsy site feels warm to the touch.  You have pus or a bad smell coming from your biopsy site.  You have blood in your urine more than 24 hours after your procedure. Get help right away if:  You have dark red or brown urine.  You have a fever.  You are unable to urinate.  You feel burning when you urinate.  You feel faint.  You have severe pain in your abdomen or side. This information is not intended to replace advice given to you by your health care provider. Make sure you discuss any questions you have with your health care provider. Document Released: 04/15/2013 Document Revised: 05/25/2016 Document Reviewed: 05/25/2016 Elsevier Interactive Patient Education  2019 [employer].

## 2019-01-21 NOTE — Procedures (Signed)
Interventional Radiology Procedure Note  Procedure: US guided left kidney mass biopsy.  Complications: None Recommendations:  - Ok to shower tomorrow - Do not submerge for 7 days - Routine wound care - advance diet - dc in 2 hours   Signed,  Dulcy Fanny. Earleen Newport, DO

## 2019-01-21 NOTE — Progress Notes (Signed)
Patient post Renal US biopsy today with Dr Earleen Newport, tolerated procedure well, vitals stable, denies complaints at this time. Eating lunch. Discharge instructions given with Stacha(group home aide) as well as Dorothyann Peng over phone with questions answered. Will plan for discharge after 1145 per orders as pt remains stable.

## 2019-01-22 ENCOUNTER — Other Ambulatory Visit: Payer: Self-pay | Admitting: Urology

## 2019-01-22 LAB — SURGICAL PATHOLOGY

## 2019-02-03 DIAGNOSIS — E113212 Type 2 diabetes mellitus with mild nonproliferative diabetic retinopathy with macular edema, left eye: Secondary | ICD-10-CM | POA: Diagnosis not present

## 2019-02-04 ENCOUNTER — Other Ambulatory Visit: Payer: Self-pay

## 2019-02-04 ENCOUNTER — Ambulatory Visit (INDEPENDENT_AMBULATORY_CARE_PROVIDER_SITE_OTHER): Payer: Medicare Other | Admitting: Urology

## 2019-02-04 ENCOUNTER — Ambulatory Visit
Admission: RE | Admit: 2019-02-04 | Discharge: 2019-02-04 | Disposition: A | Discharge status: Home / Self Care | Payer: Medicare Other | Source: Ambulatory Visit | Attending: Urology | Admitting: Urology

## 2019-02-04 DIAGNOSIS — C642 Malignant neoplasm of left kidney, except renal pelvis: Secondary | ICD-10-CM

## 2019-02-04 DIAGNOSIS — F251 Schizoaffective disorder, depressive type: Secondary | ICD-10-CM | POA: Diagnosis not present

## 2019-02-04 DIAGNOSIS — C649 Malignant neoplasm of unspecified kidney, except renal pelvis: Secondary | ICD-10-CM | POA: Diagnosis not present

## 2019-02-04 DIAGNOSIS — Z79899 Other long term (current) drug therapy: Secondary | ICD-10-CM | POA: Diagnosis not present

## 2019-02-04 NOTE — Progress Notes (Signed)
02/04/2019 4:15 PM   [name] 1951-08-26 [id number]  Referring provider: Marden Noble, MD [address] [place],   [address]  Chief Complaint  Patient presents with  . 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  He initially presented via virtual visit on 01/13/2019 with concerns for a solid left lower pole renal mass measuring 6.8 cm without any obvious signs of metastatic disease.  This was a noncontrast image.  He has multiple bilateral additional presumed renal cyst.  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  He does have baseline CKD, creatinine 2.25, GFR around 30.  No additional metastatic work-up to date.  He is asymptomatic, no weight loss or flank pain.  No gross hematuria.  No family history of renal cell carcinoma.   PMH: Past Medical History:  Diagnosis Date  . Chronic kidney disease    renal insufficiency  . Diabetes mellitus without complication ([name])    Pt takes Insulin  . Hypertension   . Mental disorder    schizoaffective    Surgical History: Past Surgical History:  Procedure Laterality Date  . ANKLE FRACTURE SURGERY    . CRANIOTOMY Left 10/27/2013   Procedure: Craniotomy for Aneurysm Clipping;  Surgeon: Consuella Lose, MD;  Location: [facility];  Service: Neurosurgery;  Laterality: Left;  . KNEE SURGERY     due to fracture.     Home Medications:  Allergies as of 02/04/2019   No Known Allergies     Medication List       Accurate as of February 04, 2019 11:59 PM. If you have any questions, ask your nurse or doctor.        acetaminophen 500 MG tablet Commonly known as: TYLENOL Take 500 mg by mouth every 6 (six) hours as needed (pain).   amLODipine-benazepril 10-40 MG capsule  Commonly known as: LOTREL Take 1 capsule by mouth daily.   atenolol 25 MG tablet Commonly known as: TENORMIN Take 25 mg by mouth at bedtime.   calcium-vitamin D 500-200 MG-UNIT tablet Commonly known as: OSCAL WITH D Take 1 tablet by mouth 2 (two) times daily.   fenofibrate 145 MG tablet Commonly known as: TRICOR Take 145 mg by mouth daily.   insulin lispro protamine-lispro (75-25) 100 UNIT/ML Susp injection Commonly known as: HUMALOG 75/25 MIX Inject 8 Units into the skin 2 (two) times daily with a meal. If above 159 increase by 2 units of insulin   lamoTRIgine 100 MG tablet Commonly known as: LAMICTAL Take 100 mg by mouth 2 (two) times daily.   omeprazole 20 MG capsule Commonly known as: PRILOSEC Take 20 mg by mouth daily.   senna 8.6 MG Tabs tablet Commonly known as: SENOKOT Take 1 tablet by mouth every 6 (six) hours as needed for mild constipation.   valsartan-hydrochlorothiazide 320-25 MG tablet Commonly known as: DIOVAN-HCT Take 1 tablet by mouth daily.   ziprasidone 40 MG capsule Commonly known as: GEODON Take 40-80 mg by mouth 2 (two) times daily with a meal. Take 40 mg in the morning and 80 mg in the evening       Allergies: No Known Allergies  Family History: No family history on file.  Social History:  reports that he has been smoking. He has been smoking about  1.00 pack per day. He does not have any smokeless tobacco history on file. No history on file for alcohol and drug.  ROS: UROLOGY Frequent Urination?: No Hard to postpone urination?: No Burning/pain with urination?: No Get up at night to urinate?: No Leakage of urine?: No Urine stream starts and stops?: No Trouble starting stream?: No Do you have to strain to urinate?: No Blood in urine?: No Urinary tract infection?: No Sexually transmitted disease?: No Injury to kidneys or bladder?: No Painful intercourse?: No Weak stream?: No Erection problems?: No Penile pain?: No   Gastrointestinal Nausea?: No Vomiting?: No Indigestion/heartburn?: No Diarrhea?: No Constipation?: No  Constitutional Fever: No Night sweats?: No Weight loss?: No Fatigue?: No  Skin Skin rash/lesions?: No Itching?: No  Eyes Blurred vision?: No Double vision?: No  Ears/Nose/Throat Sore throat?: No Sinus problems?: No  Hematologic/Lymphatic Swollen glands?: No Easy bruising?: No  Cardiovascular Leg swelling?: No Chest pain?: No  Respiratory Cough?: No Shortness of breath?: No  Endocrine Excessive thirst?: No  Musculoskeletal Back pain?: No Joint pain?: No  Neurological Headaches?: No Dizziness?: No  Psychologic Depression?: Yes Anxiety?: Yes  Physical Exam: Reviewed in epic Constitutional:  Alert and oriented, No acute distress. HEENT: [place] AT, moist mucus membranes.  Trachea midline, no masses. Cardiovascular: No clubbing, cyanosis, or edema. Respiratory: Normal respiratory effort, no increased work of breathing. Skin: No rashes, bruises or suspicious lesions. Neurologic: Grossly intact, no focal deficits, moving all 4 extremities. Psychiatric: Normal mood and affect.  Laboratory Data: Lab Results  Component Value Date   WBC 6.7 01/21/2019   HGB 12.0 (L) 01/21/2019   HCT 35.8 (L) 01/21/2019   MCV 89.1 01/21/2019   PLT 427 (H) 01/21/2019    Lab Results  Component Value Date   CREATININE 2.25 (H) 02/04/2019   Surgical pathology personally reviewed.  Assessment & Plan:    1. Cancer of kidney parenchyma, left ([name]) Lengthy discussion about the patient's newly diagnosed papillary renal cell carcinoma, approximately 6.8 cm Given that his imaging study was noncontrast, is difficult to assess whether the lesion would be amenable to partial nephrectomy In addition to the above, he has multiple bilateral renal lesions, presumably cyst however would prefer to have further diagnostic evaluation to help with surgical planning He is agreeable this  plan, will plan for low contrast MR if feasible with baseline creatinine Additional metastatic work-up including CMP/chest x-ray was also discussed and ordered today He is agreeable to this plan will return once he has had completion of his diagnostic evaluation  all questions answered - MR Abdomen W Wo Contrast; Future - Comprehensive metabolic panel - Chest 1 View; Future   Return in about 2 weeks (around 02/18/2019) for f/u MRI/ CXR/ CMP.  Hollice Espy, MD  [facility] [address] [place], [place] [address] [phone]

## 2019-02-04 NOTE — Patient Instructions (Signed)
Kidney Cancer  Kidney cancer is an abnormal growth of cells in one or both kidneys. The kidneys filter waste from your blood and produce urine. Kidney cancer may spread to other parts of your body. This type of cancer may also be called renal cell carcinoma. What are the causes? The cause of this condition is not always known. In some cases, abnormal changes to genes (genetic mutations) can cause cells to form cancer. What increases the risk? You may be more likely to develop kidney cancer if you:  Are over age 70. The risk increases with age.  Have a family history of kidney cancer.  Are of African-American, Native American, or Native [place] descent.  Smoke.  Are male.  Are obese.  Have high blood pressure (hypertension).  Have advanced kidney disease, especially if you need long-term dialysis.  Have certain conditions that are passed from parent to child (inherited), such as von Hippel-Lindau disease, tuberous sclerosis, or hereditary papillary renal carcinoma.  Have been exposed to certain chemicals. What are the signs or symptoms? In the early stages, kidney cancer does not cause symptoms. As the cancer grows, symptoms may include:  Blood in the urine.  Pain in the upper back or abdomen, just below the rib cage. You may feel pain on one or both sides of the body.  Fatigue.  Unexplained weight loss.  Fever. How is this diagnosed? This condition may be diagnosed based on:  Your symptoms and medical history.  A physical exam.  Blood and urine tests.  X-rays.  Imaging tests, such as CT scans, MRIs, and PET scans.  Having dye injected into your blood through an IV, and then having X-rays taken of: ? Your kidneys and the rest of the organs involved in making and storing urine (intravenous pyelogram). ? Your blood vessels (angiogram).  Removal and testing of a kidney tissue sample (biopsy). Your cancer will be assessed (staged), based on how severe it is and how  much it has spread. How is this treated? Treatment depends on the type and stage of the cancer. Treatment may include one or more of the following:  Surgery. This may include surgery to remove: ? Just the tumor (nephron-sparing surgery). ? The entire kidney (nephrectomy). ? The kidney, some of the surrounding healthy tissue, nearby lymph nodes, and the adrenal gland in certain cases (radical nephrectomy).  Medicines that kill cancer cells (chemotherapy).  High-energy rays that kill cancer cells (radiation therapy).  Targeted therapy. This targets specific parts of cancer cells and the area around them to block the growth and the spread of the cancer. Targeted therapy can help to limit the damage to healthy cells.  Medicines that help your body's disease-fighting system (immune system) fight cancer cells (immunotherapy).  Freezing cancer cells using gas or liquid that is delivered through a needle (cryoablation).  Destroying cancer cells using high-energy radio waves that are delivered through a needle-like probe (radiofrequency ablation).  A procedure to block the artery that supplies blood to the tumor, which kills the cancer cells (embolization). Follow these instructions at home: Eating and drinking  Some of your treatments might affect your appetite and your ability to chew and swallow. If you are having problems eating, or if you do not have an appetite, meet with a diet and nutrition specialist (dietitian).  If you have side effects that affect eating, it may help to: ? Eat smaller meals and snacks often. ? Drink high-nutrition and high-calorie shakes or supplements. ? Eat bland and soft  foods that are easy to eat. ? Not eat foods that are hot, spicy, or hard to swallow. Lifestyle  Do not drink alcohol.  Do not use any products that contain nicotine or tobacco, such as cigarettes and e-cigarettes. If you need help quitting, ask your health care provider. General  instructions   Take over-the-counter and prescription medicines only as told by your health care provider. This includes vitamins, supplements, and herbal products.  Consider joining a support group to help you cope with the stress of having kidney cancer.  Work with your health care provider to manage any side effects of treatment.  Keep all follow-up visits as told by your health care provider. This is important. Where to find more information  American Cancer Society: https://www.cancer.[place] https://www.cancer.gov Contact a health care provider if you:  Notice that you bruise or bleed easily.  Are losing weight without trying.  Have new or increased fatigue or weakness. Get help right away if you have:  Blood in your urine.  A sudden increase in pain.  A fever.  Shortness of breath.  Chest pain.  Yellow skin or whites of your eyes (jaundice). Summary  Kidney cancer is an abnormal growth of cells (tumor) in one or both kidneys. Tumors may spread to other parts of your body.  In the early stages, kidney cancer does not cause symptoms. As the cancer grows, symptoms may include blood in the urine, pain in the upper back or abdomen, unexplained weight loss, fatigue, and fever.  Treatment depends on the type and stage of the cancer. It may include surgery to remove the tumor, procedures and medicines to kill the cancer cells, or medicines to help your body fight cancer cells. This information is not intended to replace advice given to you by your health care provider. Make sure you discuss any questions you have with your health care provider. Document Released: 06/23/2004 Document Revised: 09/01/2017 Document Reviewed: 09/01/2017 Elsevier Interactive Patient Education  2019 [employer].

## 2019-02-05 ENCOUNTER — Telehealth: Payer: Self-pay

## 2019-02-05 ENCOUNTER — Other Ambulatory Visit: Payer: Medicare Other

## 2019-02-05 LAB — COMPREHENSIVE METABOLIC PANEL
ALT: 8 IU/L (ref 0–44)
AST: 11 IU/L (ref 0–40)
Albumin/Globulin Ratio: 1.6 (ref 1.2–2.2)
Albumin: 4.1 g/dL (ref 3.8–4.8)
Alkaline Phosphatase: 43 IU/L (ref 39–117)
BUN/Creatinine Ratio: 11 (ref 10–24)
BUN: 25 mg/dL (ref 8–27)
Bilirubin Total: 0.3 mg/dL (ref 0.0–1.2)
CO2: 22 mmol/L (ref 20–29)
Calcium: 9.7 mg/dL (ref 8.6–10.2)
Chloride: 112 mmol/L — ABNORMAL HIGH (ref 96–106)
Creatinine, Ser: 2.25 mg/dL — ABNORMAL HIGH (ref 0.76–1.27)
GFR calc Af Amer: 33 mL/min/{1.73_m2} — ABNORMAL LOW (ref >59)
GFR calc non Af Amer: 29 mL/min/{1.73_m2} — ABNORMAL LOW (ref >59)
Globulin, Total: 2.6 g/dL (ref 1.5–4.5)
Glucose: 223 mg/dL — ABNORMAL HIGH (ref 65–99)
Potassium: 4.1 mmol/L (ref 3.5–5.2)
Sodium: 148 mmol/L — ABNORMAL HIGH (ref 134–144)
Total Protein: 6.7 g/dL (ref 6.0–8.5)

## 2019-02-05 NOTE — Telephone Encounter (Signed)
Riley Stuart patient's caregiver notified

## 2019-02-05 NOTE — Telephone Encounter (Signed)
-----   Message from Hollice Espy, MD sent at 02/05/2019  8:17 AM EDT ----- CXR looks fine.    Hollice Espy, MD

## 2019-02-05 NOTE — Progress Notes (Signed)
Tumor Board Documentation  Riley Stuart was presented by Dr Erlene Quan at our Tumor Board on 02/05/2019, which included representatives from medical oncology, radiation oncology, surgical oncology, surgical, radiology, pathology, internal medicine, navigation, research, palliative care.  Riley Stuart currently presents for [name], for new positive pathology with history of the following treatments: active survellience.  Additionally, we reviewed previous medical and familial history, history of present illness, and recent lab results along with all available histopathologic and imaging studies. The tumor board considered available treatment options and made the following recommendations: Additional screening Low Contrast MRI, Possible surgery vs referral to [name]  The following procedures/referrals were also placed: No orders of the defined types were placed in this encounter.   Clinical Trial Status: not discussed   Staging used:    AJCC Staging:       Group: Type 2 Papillary Renal cell Carcinoma   National site-specific guidelines   were discussed with respect to the case.  Tumor board is a meeting of clinicians from various specialty areas who evaluate and discuss patients for whom a multidisciplinary approach is being considered. Final determinations in the plan of care are those of the provider(s). The responsibility for follow up of recommendations given during tumor board is that of the provider.   Today's extended care, comprehensive team conference, Riley Stuart was not present for the discussion and was not examined.   Multidisciplinary Tumor Board is a multidisciplinary case peer review process.  Decisions discussed in the Multidisciplinary Tumor Board reflect the opinions of the specialists present at the conference without having examined the patient.  Ultimately, treatment and diagnostic decisions rest with the primary provider(s) and the patient.

## 2019-02-09 ENCOUNTER — Encounter: Payer: Self-pay | Admitting: Urology

## 2019-02-12 ENCOUNTER — Other Ambulatory Visit: Payer: Self-pay

## 2019-02-12 ENCOUNTER — Ambulatory Visit
Admission: RE | Admit: 2019-02-12 | Discharge: 2019-02-12 | Disposition: A | Discharge status: Home / Self Care | Payer: Medicare Other | Source: Ambulatory Visit | Attending: Urology | Admitting: Urology

## 2019-02-12 DIAGNOSIS — C642 Malignant neoplasm of left kidney, except renal pelvis: Secondary | ICD-10-CM | POA: Diagnosis not present

## 2019-02-12 MED ORDER — GADOBUTROL 1 MMOL/ML IV SOLN
10.0000 mL | Freq: Once | INTRAVENOUS | Status: AC | PRN
  Administered 2019-02-12: 10 mL via INTRAVENOUS

## 2019-02-16 DIAGNOSIS — R05 Cough: Secondary | ICD-10-CM | POA: Diagnosis not present

## 2019-02-16 DIAGNOSIS — I10 Essential (primary) hypertension: Secondary | ICD-10-CM | POA: Diagnosis not present

## 2019-02-16 DIAGNOSIS — Z20828 Contact with and (suspected) exposure to other viral communicable diseases: Secondary | ICD-10-CM | POA: Diagnosis not present

## 2019-02-18 DIAGNOSIS — R809 Proteinuria, unspecified: Secondary | ICD-10-CM | POA: Diagnosis not present

## 2019-02-18 DIAGNOSIS — I129 Hypertensive chronic kidney disease with stage 1 through stage 4 chronic kidney disease, or unspecified chronic kidney disease: Secondary | ICD-10-CM | POA: Diagnosis not present

## 2019-02-18 DIAGNOSIS — N183 Chronic kidney disease, stage 3 (moderate): Secondary | ICD-10-CM | POA: Diagnosis not present

## 2019-02-18 DIAGNOSIS — E1122 Type 2 diabetes mellitus with diabetic chronic kidney disease: Secondary | ICD-10-CM | POA: Diagnosis not present

## 2019-02-18 DIAGNOSIS — C642 Malignant neoplasm of left kidney, except renal pelvis: Secondary | ICD-10-CM | POA: Diagnosis not present

## 2019-02-24 ENCOUNTER — Ambulatory Visit (INDEPENDENT_AMBULATORY_CARE_PROVIDER_SITE_OTHER): Payer: Medicare Other | Admitting: Urology

## 2019-02-24 ENCOUNTER — Encounter: Payer: Self-pay | Admitting: Urology

## 2019-02-24 ENCOUNTER — Other Ambulatory Visit: Payer: Self-pay

## 2019-02-24 DIAGNOSIS — N2889 Other specified disorders of kidney and ureter: Secondary | ICD-10-CM

## 2019-02-24 NOTE — Progress Notes (Signed)
02/24/2019 4:02 PM   Riley Stuart May 07, 1951 [id number]  Referring provider: Novella Rob, [place],  [place] [address]  Chief Complaint  Patient presents with  . Follow-up    HPI: 68 year old male with a personal history of CKD who returns today with follow-up MRI.  He initially presented via virtual visit on 01/13/2019 with concerns for a solid left lower pole renal mass measuring 6.8 cm without any obvious signs of metastatic disease.  This was a noncontrast image.  He has multiple bilateral additional presumed renal cyst.  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  He does have baseline CKD, creatinine 2.25, GFR around 30.  More recently, he underwent MRI to help define the anatomic borders of the lesion as well as rule out any additional masses given his multifocal cystic disease.  This revealed a solitary enhancing tumor on the left lower pole.   PMH: Past Medical History:  Diagnosis Date  . Chronic kidney disease    renal insufficiency  . Diabetes mellitus without complication ([name])    Pt takes Insulin  . Hypertension   . Mental disorder    schizoaffective    Surgical History: Past Surgical History:  Procedure Laterality Date  . ANKLE FRACTURE SURGERY    . CRANIOTOMY Left 10/27/2013   Procedure: Craniotomy for Aneurysm Clipping;  Surgeon: Consuella Lose, MD;  Location: [facility];  Service: Neurosurgery;  Laterality: Left;  . KNEE SURGERY     due to fracture.     Home Medications:  Allergies as of 02/24/2019   No Known Allergies     Medication List       Accurate as of February 24, 2019  4:02 PM. If you have any questions, ask your nurse or doctor.        acetaminophen 500 MG tablet Commonly known as: TYLENOL Take 500 mg by mouth every 6 (six) hours as needed (pain).   amLODipine-benazepril 10-40 MG capsule Commonly known  as: LOTREL Take 1 capsule by mouth daily.   atenolol 25 MG tablet Commonly known as: TENORMIN Take 25 mg by mouth at bedtime.   calcium-vitamin D 500-200 MG-UNIT tablet Commonly known as: OSCAL WITH D Take 1 tablet by mouth 2 (two) times daily.   fenofibrate 145 MG tablet Commonly known as: TRICOR Take 145 mg by mouth daily.   insulin lispro protamine-lispro (75-25) 100 UNIT/ML Susp injection Commonly known as: HUMALOG 75/25 MIX Inject 8 Units into the skin 2 (two) times daily with a meal. If above 159 increase by 2 units of insulin   lamoTRIgine 100 MG tablet Commonly known as: LAMICTAL Take 100 mg by mouth 2 (two) times daily.   omeprazole 20 MG capsule Commonly known as: PRILOSEC Take 20 mg by mouth daily.   senna 8.6 MG Tabs tablet Commonly known as: SENOKOT Take 1 tablet by mouth every 6 (six) hours as needed for mild constipation.   tamsulosin 0.4 MG Caps capsule Commonly known as: FLOMAX   valsartan-hydrochlorothiazide 320-25 MG tablet Commonly known as: DIOVAN-HCT Take 1 tablet by mouth daily.   ziprasidone 40 MG capsule Commonly known as: GEODON Take 40-80 mg by mouth 2 (two) times daily with a meal. Take 40 mg in the morning and 80 mg in the evening       Allergies: No Known Allergies  Family History: History reviewed. No pertinent family history.  Social History:  reports that he has been smoking. He has been smoking about 1.00 pack per day. He has never used smokeless tobacco. He reports previous alcohol use. No history on file for drug.  ROS: UROLOGY Frequent Urination?: No Hard to postpone urination?: No Burning/pain with urination?: No Get up at night to urinate?: No Leakage of urine?: No Urine stream starts and stops?: No Trouble starting stream?: No Do you have to strain to urinate?: No Blood in urine?: No Urinary tract infection?: No Sexually transmitted disease?: No Injury to kidneys or bladder?: Yes Painful intercourse?: No Weak  stream?: No Erection problems?: No Penile pain?: No  Gastrointestinal Nausea?: No Vomiting?: No Indigestion/heartburn?: No Diarrhea?: No Constipation?: No  Constitutional Fever: No Night sweats?: No Weight loss?: No Fatigue?: No  Skin Skin rash/lesions?: No Itching?: No  Eyes Blurred vision?: No Double vision?: No  Ears/Nose/Throat Sore throat?: No Sinus problems?: No  Hematologic/Lymphatic Swollen glands?: No Easy bruising?: No  Cardiovascular Leg swelling?: No Chest pain?: No  Respiratory Cough?: No Shortness of breath?: No  Endocrine Excessive thirst?: No  Musculoskeletal Back pain?: No Joint pain?: No  Neurological Headaches?: No Dizziness?: No  Psychologic Depression?: No Anxiety?: No  Physical Exam: BP (!) 142/77   Pulse 78   Ht 6\' 1"  (1.854 m)   Wt 238 lb (108 kg)   BMI 31.40 kg/m   Constitutional:  Alert and oriented, No acute distress. HEENT: [place] AT, moist mucus membranes.  Trachea midline, no masses. Cardiovascular: No clubbing, cyanosis, or edema. Respiratory: Normal respiratory effort, no increased work of breathing. Skin: No rashes, bruises or suspicious lesions. Neurologic: Grossly intact, no focal deficits, moving all 4 extremities. Psychiatric: Normal mood and affect.  Laboratory Data: Lab Results  Component Value Date   WBC 6.7 01/21/2019   HGB 12.0 (L) 01/21/2019   HCT 35.8 (L) 01/21/2019   MCV 89.1 01/21/2019   PLT 427 (H) 01/21/2019    Lab Results  Component Value Date   CREATININE 2.25 (H) 02/04/2019    Pertinent Imaging: LINICAL DATA:  Biopsy-proven papillary renal cell carcinoma of the lower left kidney on 01/21/2019 ultrasound-guided biopsy.  EXAM: MRI ABDOMEN WITHOUT AND WITH CONTRAST  TECHNIQUE: Multiplanar multisequence MR imaging of the abdomen was performed both before and after the administration of intravenous contrast.  CONTRAST:  10 cc Gadavist IV.  COMPARISON:  11/27/2018  noncontrast CT abdomen/pelvis.  FINDINGS: Lower chest: No acute abnormality at the lung bases.  Hepatobiliary: Normal liver size and configuration. No hepatic steatosis. No liver mass. There is a layering 1.6 cm gallstone within the otherwise normal gallbladder, with no gallbladder wall thickening, no gallbladder distension and no pericholecystic fluid. No biliary ductal dilatation. Common bile duct diameter 4 mm. No evidence of choledocholithiasis.  Pancreas: No pancreatic mass or duct dilation.  No pancreas divisum.  Spleen: Normal size. No mass.  Adrenals/Urinary Tract: No discrete adrenal nodules. No hydronephrosis. There is a partially exophytic 7.2 x 7.1 x 7.0 cm renal cortical mass with irregular outer contour centered in the lower left kidney (series 19/image 49), which demonstrates heterogeneous T2 hypointensity, heterogeneous T1 hyperintensity and heterogeneous enhancement, compatible with a hemorrhagic papillary renal cell carcinoma, which appears contained by Gerota's fascia. There is a 1.7 x 1.6 cm renal cortical lesion in the upper right kidney (series 10/image 20), which demonstrates mild T1 hyperintensity and T2 hypointensity without appreciable enhancement on the subtraction sequences, compatible with a hemorrhagic/proteinaceous Bosniak category 2 renal cyst. There is a Bosniak category 2 upper left renal 4.4  cm cyst with thin internal septation (series/image 11). There are numerous simple renal cysts throughout both kidneys, largest 3.2 cm in the lower right kidney.  Stomach/Bowel: Normal non-distended stomach. Visualized small and large bowel is normal caliber, with no bowel wall thickening.  Vascular/Lymphatic: Normal caliber abdominal aorta. Patent portal, splenic, hepatic and renal veins. No evidence of renal vein tumor thrombus. No pathologically enlarged lymph nodes in the abdomen.  Other: No abdominal ascites or focal fluid collection.   Musculoskeletal: No aggressive appearing focal osseous lesions. Heterogeneous endplate based signal intensity throughout the lower lumbar vertebral bodies correlates with severe degenerative disc disease on recent CT study.  IMPRESSION: 1. Enhancing 7.2 cm renal cortical mass in the lower left kidney, with MRI features compatible with hemorrhagic papillary renal cell carcinoma. No evidence of renal vein tumor thrombus. 2. No findings of metastatic disease in the abdomen. 3. Additional Bosniak category 1 and category 2 renal cysts in both kidneys. 4. Cholelithiasis.   Electronically Signed   By: Ilona Sorrel M.D.   On: 02/12/2019 17:06  MRI was personally reviewed today.  Agree with radiologic interpretation.  All of renal cyst appear to be benign.  Superior margin of this tumor does appear to be in relatively close proximity to right renal vein.  Assessment & Plan:    1.  Renal cell carcinoma, papillary type- 7.2 cm biopsy-proven papillary renal cell carcinoma on the left  MRI was reviewed and no additional lesions are identified, all other cyst appear to be benign.  Given the location of lesion in close proximity to the renal vein, it is unclear whether or not this mass would be amenable to partial nephrectomy.  I have offered to refer the patient to [facility] or [facility] for second opinion to assess whether or not this seems technically possible.  We discussed today that radical nephrectomy would be an option, however given his baseline CKD, I am concerned that this would lead to progression to end-stage renal disease.  He would be better served with a partial nephrectomy if technically feasible.  Finally, the option of continued close surveillance was discussed in detail.  If he did elect to pursue this, would recommend fairly close follow-up with an MRI in about 4 months to assess for any interval growth.  We did discuss if the lesion did become metastatic in the interim, treatment  options are relatively limited.  He seems understand all this treatment options and able to verbalize the risk and benefits of each.  Although transportation not be an issue to [facility] or [place], he is very reticent to pursue an evaluation there at this time.  He is most leaning towards active surveillance at this time and understands the possible implications of this.  All questions were answered today.  We will plan for follow-up with MR in 4 months.  Return in about 4 months (around 06/26/2019) for MD follow up.  Hollice Espy, MD  [facility] [address] [place], [place] [address] [phone]  I spent 25 min with this patient of which greater than 50% was spent in counseling and coordination of care with the patient.

## 2019-03-02 DIAGNOSIS — R05 Cough: Secondary | ICD-10-CM | POA: Diagnosis not present

## 2019-03-02 DIAGNOSIS — I10 Essential (primary) hypertension: Secondary | ICD-10-CM | POA: Diagnosis not present

## 2019-03-02 DIAGNOSIS — Z20828 Contact with and (suspected) exposure to other viral communicable diseases: Secondary | ICD-10-CM | POA: Diagnosis not present

## 2019-03-31 DIAGNOSIS — I10 Essential (primary) hypertension: Secondary | ICD-10-CM | POA: Diagnosis not present

## 2019-03-31 DIAGNOSIS — R05 Cough: Secondary | ICD-10-CM | POA: Diagnosis not present

## 2019-03-31 DIAGNOSIS — Z20828 Contact with and (suspected) exposure to other viral communicable diseases: Secondary | ICD-10-CM | POA: Diagnosis not present

## 2019-04-22 DIAGNOSIS — F251 Schizoaffective disorder, depressive type: Secondary | ICD-10-CM | POA: Diagnosis not present

## 2019-05-18 DIAGNOSIS — Z20828 Contact with and (suspected) exposure to other viral communicable diseases: Secondary | ICD-10-CM | POA: Diagnosis not present

## 2019-05-18 DIAGNOSIS — R05 Cough: Secondary | ICD-10-CM | POA: Diagnosis not present

## 2019-05-18 DIAGNOSIS — I10 Essential (primary) hypertension: Secondary | ICD-10-CM | POA: Diagnosis not present

## 2019-06-03 DIAGNOSIS — Z720 Tobacco use: Secondary | ICD-10-CM | POA: Diagnosis not present

## 2019-06-03 DIAGNOSIS — I10 Essential (primary) hypertension: Secondary | ICD-10-CM | POA: Diagnosis not present

## 2019-06-03 DIAGNOSIS — R809 Proteinuria, unspecified: Secondary | ICD-10-CM | POA: Diagnosis not present

## 2019-06-03 DIAGNOSIS — E1129 Type 2 diabetes mellitus with other diabetic kidney complication: Secondary | ICD-10-CM | POA: Diagnosis not present

## 2019-06-03 DIAGNOSIS — C642 Malignant neoplasm of left kidney, except renal pelvis: Secondary | ICD-10-CM | POA: Diagnosis not present

## 2019-06-03 DIAGNOSIS — N183 Chronic kidney disease, stage 3 unspecified: Secondary | ICD-10-CM | POA: Diagnosis not present

## 2019-06-08 DIAGNOSIS — E1122 Type 2 diabetes mellitus with diabetic chronic kidney disease: Secondary | ICD-10-CM | POA: Diagnosis not present

## 2019-06-08 DIAGNOSIS — N183 Chronic kidney disease, stage 3 unspecified: Secondary | ICD-10-CM | POA: Insufficient documentation

## 2019-06-08 DIAGNOSIS — N1832 Chronic kidney disease, stage 3b: Secondary | ICD-10-CM | POA: Diagnosis not present

## 2019-06-08 DIAGNOSIS — I129 Hypertensive chronic kidney disease with stage 1 through stage 4 chronic kidney disease, or unspecified chronic kidney disease: Secondary | ICD-10-CM | POA: Diagnosis not present

## 2019-06-08 DIAGNOSIS — R809 Proteinuria, unspecified: Secondary | ICD-10-CM | POA: Diagnosis not present

## 2019-06-08 DIAGNOSIS — N2889 Other specified disorders of kidney and ureter: Secondary | ICD-10-CM | POA: Insufficient documentation

## 2019-06-19 ENCOUNTER — Other Ambulatory Visit: Payer: Self-pay

## 2019-06-19 ENCOUNTER — Ambulatory Visit
Admission: RE | Admit: 2019-06-19 | Discharge: 2019-06-19 | Disposition: A | Discharge status: Home / Self Care | Payer: Medicare Other | Source: Ambulatory Visit | Attending: Urology | Admitting: Urology

## 2019-06-19 DIAGNOSIS — N2889 Other specified disorders of kidney and ureter: Secondary | ICD-10-CM | POA: Diagnosis not present

## 2019-06-19 DIAGNOSIS — C642 Malignant neoplasm of left kidney, except renal pelvis: Secondary | ICD-10-CM | POA: Diagnosis not present

## 2019-06-19 MED ORDER — GADOBUTROL 1 MMOL/ML IV SOLN
10.0000 mL | Freq: Once | INTRAVENOUS | Status: AC | PRN
  Administered 2019-06-19: 10 mL via INTRAVENOUS

## 2019-06-24 ENCOUNTER — Ambulatory Visit (INDEPENDENT_AMBULATORY_CARE_PROVIDER_SITE_OTHER): Payer: Medicare Other | Admitting: Urology

## 2019-06-24 ENCOUNTER — Other Ambulatory Visit: Payer: Self-pay

## 2019-06-24 ENCOUNTER — Encounter: Payer: Self-pay | Admitting: Urology

## 2019-06-24 VITALS — BP 157/94 | HR 53 | Ht 73.0 in | Wt 233.0 lb

## 2019-06-24 DIAGNOSIS — C642 Malignant neoplasm of left kidney, except renal pelvis: Secondary | ICD-10-CM | POA: Diagnosis not present

## 2019-06-24 DIAGNOSIS — N1832 Chronic kidney disease, stage 3b: Secondary | ICD-10-CM | POA: Diagnosis not present

## 2019-06-24 NOTE — Progress Notes (Signed)
06/24/2019 3:19 PM   Riley Stuart 30-Apr-1951 [id number]  Referring provider: Novella Rob, [place],  [place] [address]  Chief Complaint  Patient presents with   Results    HPI: 68 year old male with a personal history of CKD, biopsy-proven left papillary renal cell carcinoma who returns today with serial imaging in the form of MRI follow-up.  He initially presented via virtual visit on 01/13/2019 with concerns for a solid left lower pole renal mass measuring 6.8 cm without any obvious signs of metastatic disease. This was a noncontrast image. He has multiple bilateral additional presumed renal cyst.  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  He does have baseline CKD, creatinine 2.25, GFR around 30.  More recently, he underwent MRI to help define the anatomic borders of the lesion as well as rule out any additional masses given his multifocal cystic disease.  This revealed a solitary enhancing tumor on the left lower pole.  He follows up today with interval MRI of 4 months later.  Unfortunately, this does show interval growth, lobulated lesion now measuring 6.9 x 7.5 x 7.60 cm, previously 6.5 x 6.4 x 6.9.  No evidence of metastatic disease.  Evidence of hilar invasion.  No flank pain or weight loss.  No previous abdominal surgeries.   PMH: Past Medical History:  Diagnosis Date   Chronic kidney disease    renal insufficiency   Diabetes mellitus without complication ([place])    Pt takes Insulin   Hypertension    Mental disorder    schizoaffective    Surgical History: Past Surgical History:  Procedure Laterality Date   ANKLE FRACTURE SURGERY     CRANIOTOMY Left 10/27/2013   Procedure: Craniotomy for Aneurysm Clipping;  Surgeon: Consuella Lose, MD;  Location: [facility];  Service: Neurosurgery;  Laterality: Left;   KNEE SURGERY       due to fracture.     Home Medications:  Allergies as of 06/24/2019   No Known Allergies     Medication List       Accurate as of June 24, 2019  3:19 PM. If you have any questions, ask your nurse or doctor.        acetaminophen 500 MG tablet Commonly known as: TYLENOL Take 500 mg by mouth every 6 (six) hours as needed (pain).   amLODipine-benazepril 10-40 MG capsule Commonly known as: LOTREL Take 1 capsule by mouth daily.   atenolol 25 MG tablet Commonly known as: TENORMIN Take 25 mg by mouth at bedtime.   calcium-vitamin D 500-200 MG-UNIT tablet Commonly known as: OSCAL WITH D Take 1 tablet by mouth 2 (two) times daily.   fenofibrate 145 MG tablet Commonly known as: TRICOR Take 145 mg by mouth daily.   Ingrezza 40 MG Caps Generic drug: Valbenazine Tosylate Take by mouth.   insulin lispro protamine-lispro (75-25) 100 UNIT/ML Susp injection Commonly known as: HUMALOG 75/25 MIX Inject 8 Units into the skin 2 (two) times daily with a meal. If above 159 increase by 2 units of insulin   lamoTRIgine 100 MG tablet Commonly known as: LAMICTAL Take 100 mg by mouth 2 (two) times daily.   omeprazole 20 MG capsule Commonly known as: PRILOSEC Take 20 mg by mouth daily.   senna 8.6 MG Tabs tablet Commonly known as: SENOKOT Take 1 tablet by mouth every 6 (six) hours as needed for mild constipation.  tamsulosin 0.4 MG Caps capsule Commonly known as: FLOMAX   valsartan-hydrochlorothiazide 320-25 MG tablet Commonly known as: DIOVAN-HCT Take 1 tablet by mouth daily.   ziprasidone 40 MG capsule Commonly known as: GEODON Take 40-80 mg by mouth 2 (two) times daily with a meal. Take 40 mg in the morning and 80 mg in the evening       Allergies: No Known Allergies  Family History: No family history on file.  Social History:  reports that he has been smoking. He has been smoking about 1.00 pack per day. He has never used smokeless tobacco. He reports previous  alcohol use. No history on file for drug.  ROS: UROLOGY Frequent Urination?: No Hard to postpone urination?: No Burning/pain with urination?: No Get up at night to urinate?: No Leakage of urine?: No Urine stream starts and stops?: No Trouble starting stream?: No Do you have to strain to urinate?: No Blood in urine?: No Urinary tract infection?: No Sexually transmitted disease?: No Injury to kidneys or bladder?: No Painful intercourse?: No Weak stream?: No Erection problems?: No Penile pain?: No  Gastrointestinal Nausea?: No Vomiting?: No Indigestion/heartburn?: No Diarrhea?: No Constipation?: No  Constitutional Fever: No Night sweats?: No Weight loss?: No Fatigue?: No  Skin Skin rash/lesions?: No Itching?: No  Eyes Blurred vision?: No Double vision?: No  Ears/Nose/Throat Sore throat?: No Sinus problems?: No  Hematologic/Lymphatic Swollen glands?: No Easy bruising?: No  Cardiovascular Leg swelling?: No Chest pain?: No  Respiratory Cough?: No Shortness of breath?: No  Endocrine Excessive thirst?: No  Musculoskeletal Back pain?: No Joint pain?: No  Neurological Headaches?: No Dizziness?: No  Psychologic Depression?: Yes Anxiety?: Yes  Physical Exam: BP (!) 157/94    Pulse (!) 53    Ht 6\' 1"  (1.854 m)    Wt 233 lb (105.7 kg)    BMI 30.74 kg/m   Constitutional:  Alert and oriented, No acute distress.  Good insight, asking appropriate questions. HEENT: [place] AT, moist mucus membranes.  Trachea midline, no masses. Cardiovascular: No clubbing, cyanosis, or edema. Respiratory: Normal respiratory effort, no increased work of breathing. GI: Abdomen is soft, nontender, nondistended, no abdominal masses Skin: No rashes, bruises or suspicious lesions. Neurologic: Grossly intact, no focal deficits, moving all 4 extremities. Psychiatric: Normal mood and affect.  Laboratory Data: Lab Results  Component Value Date   WBC 6.7 01/21/2019   HGB 12.0 (L)  01/21/2019   HCT 35.8 (L) 01/21/2019   MCV 89.1 01/21/2019   PLT 427 (H) 01/21/2019    Lab Results  Component Value Date   CREATININE 2.25 (H) 02/04/2019    Pertinent Imaging: CLINICAL DATA:  Renal mass. Biopsy-proven papillary renal cell carcinoma of the LEFT kidney.  EXAM: MRI ABDOMEN WITHOUT AND WITH CONTRAST  TECHNIQUE: Multiplanar multisequence MR imaging of the abdomen was performed both before and after the administration of intravenous contrast.  CONTRAST:  22mL GADAVIST GADOBUTROL 1 MMOL/ML IV SOLN  COMPARISON:  MRI 02/12/2019  FINDINGS: Lower chest: Lung bases are clear.  Hepatobiliary: No focal hepatic lesion. No biliary duct dilatation. Several large gallstones within lumen of the gallbladder. No gallbladder inflammation.  Pancreas: Pancreas is normal. No ductal dilatation. No pancreatic inflammation.  Spleen: Normal spleen  Adrenals/urinary tract: Adrenal glands are normal.  Large multilobulated mass again demonstrated involving the cortex and partially exophytic from the lower pole of the LEFT kidney. Multilobulated mass is solid and partially hemorrhagic. Low level enhancement typical of papillary carcinoma.  Mass measures 6.9 x 7.5 by 7.0 cm (  volume = 190 cm^3) (image 53/15) compared with 6.5 by 6.4 by 6.9 cm (volume = 150 cm^3). Visually lesion looks very similar and slightly enlarged in the axial plane.  The large LEFT renal mass is contained within the pararenal space. No involvement of the LEFT renal hilum.  Additional bilateral nonenhancing renal cysts not changed from comparison exam. No complexity of the cysts.  No retroperitoneal lymphadenopathy.  Stomach/Bowel: Stomach, small bowel, appendix, and cecum are normal. The colon and rectosigmoid colon are normal.  Vascular/Lymphatic: Abdominal aorta is normal caliber. No periportal or retroperitoneal adenopathy. No pelvic adenopathy.  Other: No free  fluid.  Musculoskeletal: No aggressive osseous lesion.  IMPRESSION: 1. Mild interval enlargement of the multilobulated mass partially exophytic from the lower pole of the LEFT kidney. Biopsy-proven papillary renal cell carcinoma. 2. LEFT renal mass is contained within the pararenal space and does not involve the renal hilum or renal vein. 3. Additional Bosniak 1 renal cysts of the LEFT and RIGHT kidney. 4. No metastatic adenopathy or skeletal metastasis identified.   Electronically Signed   By: Suzy Bouchard M.D.   On: 06/19/2019 09:35  MRI was personally reviewed today.  This is also compared to his previous MR and does in fact show interval growth.  Assessment & Plan:    1. Renal cell cancer, left ([place]) Enlarging right papillary renal cell carcinoma  Unfortunately, the lesion continues to grow with a fairly significant growth rate over the past 4 months (more than 1 cm annually)  In the setting of fairly significant baseline CKD, ideally partial nephrectomy could be performed in order to spare as many nephrons as possible.  That being said, due to the complexity of this case and the anatomy, there is a chance of needing conversion to radical nephrectomy.  I reviewed this case personally with Dr. Phebe Colla at [facility] urology.  He would be willing to see and evaluate the patient for possible partial versus radical nephrectomy.  I could be available to help assist if appropriate given the complexity of the case.  Riley Stuart is agreeable this plan.  - Ambulatory referral to Urology  2. Stage 3b chronic kidney disease Baseline creatinine as above   Hollice Espy, MD  [facility] [address] [place], [place] [address] [phone]  I spent 25 min with this patient of which greater than 50% was spent in counseling and coordination of care with the patient.

## 2019-06-24 NOTE — Patient Instructions (Signed)
The mass on your left kidney is enlarging.  At this point, the growth rate is high enough that we recommend surgical intervention.  We are concerned that if the cancer is not removed, it can continue to grow or possibly even spread to other organs at which point it be very difficult to control/cure.  I would like you to see one of my partners, Dr. Phebe Colla in [place] at [facility] urology.  We will be calling you with the time and date of this appointment.  Today, we discussed partial nephrectomy which can be done robotic versus radical nephrectomy.  Given the location of the tumor, partial nephrectomy may be very difficult.  There is a chance going into the surgery, that we would end up taking out your whole left kidney.  This could affect your overall kidney function significantly.  Dr. Tresa Moore will discuss this with you further at the follow-up appointment.  I would like to be involved in your care and will hopefully be available to assist him during surgery.

## 2019-06-29 DIAGNOSIS — Z20828 Contact with and (suspected) exposure to other viral communicable diseases: Secondary | ICD-10-CM | POA: Diagnosis not present

## 2019-06-29 DIAGNOSIS — I10 Essential (primary) hypertension: Secondary | ICD-10-CM | POA: Diagnosis not present

## 2019-06-29 DIAGNOSIS — R05 Cough: Secondary | ICD-10-CM | POA: Diagnosis not present

## 2019-07-02 DIAGNOSIS — Z20828 Contact with and (suspected) exposure to other viral communicable diseases: Secondary | ICD-10-CM | POA: Diagnosis not present

## 2019-07-03 DIAGNOSIS — C642 Malignant neoplasm of left kidney, except renal pelvis: Secondary | ICD-10-CM | POA: Diagnosis not present

## 2019-07-14 ENCOUNTER — Other Ambulatory Visit: Payer: Self-pay | Admitting: Urology

## 2019-07-15 DIAGNOSIS — Z79899 Other long term (current) drug therapy: Secondary | ICD-10-CM | POA: Diagnosis not present

## 2019-07-15 DIAGNOSIS — F251 Schizoaffective disorder, depressive type: Secondary | ICD-10-CM | POA: Diagnosis not present

## 2019-07-31 DIAGNOSIS — J439 Emphysema, unspecified: Secondary | ICD-10-CM | POA: Diagnosis not present

## 2019-07-31 DIAGNOSIS — Z20828 Contact with and (suspected) exposure to other viral communicable diseases: Secondary | ICD-10-CM | POA: Diagnosis not present

## 2019-07-31 DIAGNOSIS — C642 Malignant neoplasm of left kidney, except renal pelvis: Secondary | ICD-10-CM | POA: Diagnosis not present

## 2019-08-04 NOTE — Progress Notes (Addendum)
PCP - Olen Pel, [place]  Cardiologist -   Chest x-ray - 02-05-19  CT Chest 07-31-19 on chart  EKG -  Stress Test -  ECHO -  Cardiac Cath -   Sleep Study -  CPAP -   Fasting Blood Sugar -  Checks Blood Sugar _____ times a day  Blood Thinner Instructions: Aspirin Instructions: Last Dose:  Anesthesia review: HTN, DM w/o complication, CKD, Cerebral Aneurysm rupture  Patient denies shortness of breath, fever, cough and chest pain at PAT appointment   Patient verbalized understanding of instructions that were given to them at the PAT appointment. Patient was also instructed that they will need to review over the PAT instructions again at home before surgery.

## 2019-08-05 NOTE — Progress Notes (Addendum)
Preop instructions for: Riley Stuart  Date of Birth    07-Dec-2050                     Date of Procedure: 08-10-19      Doctor: Raynelle Bring  Please have your COVID test at 9:30 AM at the [name] at [address] [place], [place]  Time to arrive at [facility]: 10:00 AM Report to: Admitting   Procedure: Robotic Assisted Laparascopic Nephrectomy  Please follow your bowel prep, per your surgeon's instructions the day prior to the procedure.   You may have a Clear Liquid Diet from Midnight until 7:00 AM (To include any tube feedings-must be discontinued). After 7:00 AM, nothing until after surgery.   CLEAR LIQUID DIET   Foods Allowed                                                                     Foods Excluded  Coffee and tea, regular and decaf                             liquids that you cannot  Plain Jell-O any favor except red or purple                                           see through such as: Fruit ices (not with fruit pulp)                                     milk, soups, orange juice  Iced Popsicles                                    All solid food Carbonated beverages, regular and diet                                    Cranberry, grape and apple juices Sports drinks like Gatorade Lightly seasoned clear broth or consume(fat free) Sugar, honey syrup   Take these morning medications only with sips of water.(or give through gastrostomy or feeding tube). Amlodipine, Tamsulosin, Ingrezza, Omeprazole, Lamictal, and Carvedilol (Coreg)   Note: No Insulin or Diabetic meds should be given or taken the morning of the procedure!   Facility contact:  [facility] [place]    Phone:   [phone]               Health Care POA: N/A  Transportation contact phone#: Carita Pian [phone]  Please send day of procedure:current med list and meds last taken that day, confirm nothing  by mouth status from what time, Patient Demographic info( to include DNR status, problem list, allergies)   RN contact name/phone#:   Letta Median [phone]  and Fax #: [phone]  Bring Insurance card and picture ID Leave all jewelry and other  valuables at place where living( no metal or rings to be worn) No contact lens Women-no make-up, no lotions,perfumes,powders Men-no colognes,lotions  Any questions day of procedure,call  [facility] [phone]   Sent from :[facility]                   Phone:[phone]                   [phone]  Sent by : Janyth Pupa, RN

## 2019-08-05 NOTE — Progress Notes (Addendum)
Faxed instructions for day of surgery, received by Carita Pian of [facility]. All questions answered.   AddendumElige Stuart advised to have COVID test completed prior to arrival to hospital. Test scheduled.

## 2019-08-07 NOTE — H&P (Signed)
Renal cell carcinoma   Riley Stuart is a 68 year old gentleman seen today in consultation at the request of Dr. Hollice Espy for consideration of a minimally invasive nephron sparing surgery for treatment of a large left renal cell carcinoma. He has a history of chronic kidney disease presumably related to his history of diabetes and hypertension. His serum creatinine in June was 2.25. He is followed by [facility] and sees Dr. Bruce Donath. He did undergo a CT scan of the abdomen without contrast in the spring presumably related to an abnormal mass seen on ultrasound. This demonstrated a large mass measuring up to 6.8 cm although was not well characterized due to the limitations of not being able to receive IV contrast. He subsequently underwent a renal mass biopsy that was performed on 01/22/2019. 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 He subsequently followed up with an MRI for further evaluation recently. This confirmed interval growth of this lesion with low level enhancement. His mass measured 7.5 x 7.0 cm. There appeared to be single renal vessels. No lymphadenopathy was noted. He did have multiple benign appearing cystic lesions of both kidneys. He had a chest x-ray in June that was unremarkable. He does live in a group home with supervision due to a diagnosis of schizoaffective disorder. He has denied any hematuria, weight loss, flank pain. He has no family history of kidney cancer.    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 He has never undergone abdominal surgery. He does not take any anti-platelet agents or anticoagulants.     ALLERGIES: None   MEDICATIONS: Omeprazole  Tamsulosin Hcl  Amlodipine Besylate  Carvedilol  Fenofibrate  Furosemide  Ingrezza  Lamictal  Novalog  Oyster Shell Calcium W-Vit D  Tylenol  Valsartan-Hydrochlorothiazide  Ziprasidone Hcl      GU PSH: None   NON-GU PSH: Leg surgery (unspecified)     GU PMH: None   NON-GU PMH: Anxiety Hypertension    FAMILY HISTORY: None    Notes: 0 children   SOCIAL HISTORY: Marital Status: Single Preferred Language: English; Ethnicity: Not Hispanic Or Latino; Race: Black or African American Current Smoking Status: Patient smokes. Has smoked since 06/27/1989. Smokes 1 pack per day.   Tobacco Use Assessment Completed: Used Tobacco in last 30 days? Does not drink anymore.  Drinks 3 caffeinated drinks per day.    REVIEW OF SYSTEMS:    GU Review Male:   Patient denies frequent urination, hard to postpone urination, burning/ pain with urination, get up at night to urinate, leakage of urine, stream starts and stops, trouble starting your streams, and have to strain to urinate .  Gastrointestinal (Lower):   Patient denies diarrhea and constipation.  Gastrointestinal (Upper):   Patient denies nausea and vomiting.  Constitutional:   Patient denies fever, night sweats, weight loss, and fatigue.  Skin:   Patient denies skin rash/ lesion and itching.  Eyes:   Patient denies blurred vision and double vision.  Ears/ Nose/ Throat:   Patient denies sore throat and sinus problems.  Hematologic/Lymphatic:   Patient denies swollen glands and easy bruising.  Cardiovascular:   Patient denies leg swelling and chest pains.  Respiratory:   Patient denies cough and shortness of breath.  Endocrine:   Patient denies excessive thirst.  Musculoskeletal:   Patient denies back pain and joint pain.  Neurological:   Patient denies headaches and dizziness.  Psychologic:   Patient  reports depression and anxiety.     Weight 233 lb / 105.69 kg  Height 73 in / 185.42 cm  BMI 30.7 kg/m   MULTI-SYSTEM PHYSICAL EXAMINATION:    Constitutional: Well-nourished. No physical deformities. Normally developed. Good grooming.  Neck: Neck symmetrical, not swollen. Normal tracheal position.  Respiratory: No labored  breathing, no use of accessory muscles. Clear bilaterally.  Cardiovascular: Normal temperature, normal extremity pulses, no swelling, no varicosities. Regular rate and rhythm.  Lymphatic: No enlargement of neck, axillae, groin.  Skin: No paleness, no jaundice, no cyanosis. No lesion, no ulcer, no rash.  Neurologic / Psychiatric: Oriented to time, oriented to place, oriented to person. No depression, no anxiety, no agitation.  Gastrointestinal: No mass, no tenderness, no rigidity, obese abdomen.   Eyes: Normal conjunctivae. Normal eyelids.  Ears, Nose, Mouth, and Throat: Left ear no scars, no lesions, no masses. Right ear no scars, no lesions, no masses. Nose no scars, no lesions, no masses. Normal hearing. Normal lips.  Musculoskeletal: Normal gait and station of head and neck.     PAST DATA REVIEWED:  Source Of History:  Patient  Records Review:   Pathology Reports  Urine Test Review:   Urinalysis  X-Ray Review: MRI Abdomen: Reviewed Films.    Notes:                     CLINICAL DATA: Renal mass. Biopsy-proven papillary renal cell  carcinoma of the LEFT kidney.   EXAM:  MRI ABDOMEN WITHOUT AND WITH CONTRAST   TECHNIQUE:  Multiplanar multisequence MR imaging of the abdomen was performed  both before and after the administration of intravenous contrast.   CONTRAST: 82mL GADAVIST GADOBUTROL 1 MMOL/ML IV SOLN   COMPARISON: MRI 02/12/2019   FINDINGS:  Lower chest: Lung bases are clear.   Hepatobiliary: No focal hepatic lesion. No biliary duct dilatation.  Several large gallstones within lumen of the gallbladder. No  gallbladder inflammation.   Pancreas: Pancreas is normal. No ductal dilatation. No pancreatic  inflammation.   Spleen: Normal spleen   Adrenals/urinary tract: Adrenal glands are normal.   Large multilobulated mass again demonstrated involving the cortex  and partially exophytic from the lower pole of the LEFT kidney.  Multilobulated mass is solid and partially  hemorrhagic. Low level  enhancement typical of papillary carcinoma.   Mass measures 6.9 x 7.5 by 7.0 cm (volume = 190 cm^3) (image  53/15) compared with 6.5 by 6.4 by 6.9 cm (volume = 150 cm^3).  Visually lesion looks very similar and slightly enlarged in the  axial plane..   The large LEFT renal mass is contained within the pararenal space.  No involvement of the LEFT renal hilum.   Additional bilateral nonenhancing renal cysts not changed from  comparison exam. No complexity of the cysts.   No retroperitoneal lymphadenopathy.   Stomach/Bowel: Stomach, small bowel, appendix, and cecum are normal.  The colon and rectosigmoid colon are normal.   Vascular/Lymphatic: Abdominal aorta is normal caliber. No periportal  or retroperitoneal adenopathy. No pelvic adenopathy.   Other: No free fluid.   Musculoskeletal: No aggressive osseous lesion.   IMPRESSION:  1. Mild interval enlargement of the multilobulated mass partially  exophytic from the lower pole of the LEFT kidney. Biopsy-proven  papillary renal cell carcinoma.  2. LEFT renal mass is contained within the pararenal space and does  not involve the renal hilum or renal vein.  3. Additional Bosniak 1 renal cysts of the LEFT and RIGHT kidney.  4. No metastatic adenopathy or skeletal metastasis identified.    Electronically Signed  By: Suzy Bouchard M.D.  On: 06/19/2019 09:35    Component 34mo ago  SURGICAL PATHOLOGY Surgical Pathology  CASE: [id number]  PATIENT: Riley Stuart  Surgical Pathology Report      SPECIMEN SUBMITTED:  A. Renal mass, left   CLINICAL HISTORY:  None provided   PRE-OPERATIVE DIAGNOSIS:  Likely RCC   POST-OPERATIVE DIAGNOSIS:  None provided.      DIAGNOSIS:  A.  RENAL MASS, LEFT; NEEDLE CORE BIOPSY:  - PAPILLARY RENAL CELL CARCINOMA.   Comment:  The imaging data were reviewed and this patient has a 6.8 cm left renal  mass.  In the biopsy material available for  examination, this is a WHO (IUSP)  grade 2 and is further characterized as a subtype II.  This material  does not show necrosis,  rhabdoid or sarcomatoid features.  This diagnosis has been  electronically communicated to Dr. Erlene Quan.   GROSS DESCRIPTION:  A. Labeled: Left renal mass  Received: Formalin  Tissue fragment(s): Multiple  Size: Ranging from 0.2 to 1.0 cm in length by 0.1 cm in diameter  Description: Received are markedly disrupted, needle core biopsy  fragments of tan soft tissue.  Wrapped in lens paper and placed into a  mesh bag.  Entirely submitted in cassettes 1-5.    Final Diagnosis performed by Vanessa Kick, MD.   Electronically  signed 01/22/2019 3:55:07PM  The electronic signature indicates that the named Attending Pathologist  has evaluated the specimen   Technical component performed at [facility], [address] [place],  [place] [address] Lab: [phone] Dir: Rush Farmer, MD, MMM  Professional  component performed at [facility], [facility], [place], [place], [place] [address] Lab: [phone] Dir: Dellia Nims.  Rubinas, MD      ASSESSMENT:      ICD-10 Details  1 GU:   Renal cell carcinoma, left - C64.2    PLAN:        1. 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 Berrie he and his caregiver today regarding his pathology and imaging findings confirming a large left-sided renal cell carcinoma. We discussed this in the context of his chronic kidney disease. He will complete a metastatic evaluation with a CT scan of the chest. Considering the aggressive nature of his tumor type, I did recommend surgical treatment for cure. Considering his renal dysfunction, we did discuss the desire to proceed with nephron sparing surgery and a left robot assisted laparoscopic partial nephrectomy.   The patient was provided information regarding their renal mass including the relative risk of  benign versus malignant pathology and the natural history of renal cell carcinoma and other possible malignancies of the kidney. The role of renal biopsy, laboratory testing, and imaging studies to further characterize renal masses and/or the presence of metastatic disease were explained. We discussed the role of active surveillance, surgical therapy with both radical nephrectomy and nephron-sparing surgery, and ablative therapy in the treatment of renal masses. In addition, we discussed our goals of providing an accurate diagnosis and oncologic control while maintaining optimal renal function as appropriate based on the size, location, and complexity of their renal mass as well as their co-morbidities. We have discussed the risks of treatment in detail including but not limited to bleeding, infection, heart attack, stroke, death, venothromoboembolism, cancer recurrence, injury/damage to surrounding organs and structures, urine leak, the possibility  of open surgical conversion for patients undergoing minimally invasive surgery, the risk of developing chronic kidney disease and its associated implications, and the potential risk of end stage renal disease possibly necessitating dialysis.   He does wish to proceed in this fashion. He will be scheduled for his CT scan of the chest and will have laboratory studies today. He will be tentatively be scheduled for a left robot assisted laparoscopic partial nephrectomy.

## 2019-08-10 ENCOUNTER — Encounter (HOSPITAL_COMMUNITY)
Admission: RE | Disposition: A | Discharge status: Home / Self Care | Payer: Self-pay | Source: Home / Self Care | Attending: Urology

## 2019-08-10 ENCOUNTER — Other Ambulatory Visit: Payer: Self-pay

## 2019-08-10 ENCOUNTER — Ambulatory Visit (HOSPITAL_COMMUNITY): Payer: Medicare Other | Admitting: Physician Assistant

## 2019-08-10 ENCOUNTER — Other Ambulatory Visit (HOSPITAL_COMMUNITY)
Admission: RE | Admit: 2019-08-10 | Discharge: 2019-08-10 | Disposition: A | Discharge status: Home / Self Care | Payer: Medicare Other | Source: Ambulatory Visit | Attending: Urology | Admitting: Urology

## 2019-08-10 ENCOUNTER — Inpatient Hospital Stay (HOSPITAL_COMMUNITY)
Admission: RE | Admit: 2019-08-10 | Discharge: 2019-08-12 | DRG: 658 | Disposition: A | Discharge status: Home / Self Care | Payer: Medicare Other | Attending: Urology | Admitting: Urology

## 2019-08-10 ENCOUNTER — Encounter (HOSPITAL_COMMUNITY): Payer: Self-pay | Admitting: Urology

## 2019-08-10 DIAGNOSIS — E1122 Type 2 diabetes mellitus with diabetic chronic kidney disease: Secondary | ICD-10-CM | POA: Diagnosis present

## 2019-08-10 DIAGNOSIS — C642 Malignant neoplasm of left kidney, except renal pelvis: Secondary | ICD-10-CM | POA: Diagnosis not present

## 2019-08-10 DIAGNOSIS — N189 Chronic kidney disease, unspecified: Secondary | ICD-10-CM | POA: Diagnosis present

## 2019-08-10 DIAGNOSIS — I129 Hypertensive chronic kidney disease with stage 1 through stage 4 chronic kidney disease, or unspecified chronic kidney disease: Secondary | ICD-10-CM | POA: Diagnosis present

## 2019-08-10 DIAGNOSIS — F17210 Nicotine dependence, cigarettes, uncomplicated: Secondary | ICD-10-CM | POA: Diagnosis present

## 2019-08-10 DIAGNOSIS — D49512 Neoplasm of unspecified behavior of left kidney: Secondary | ICD-10-CM | POA: Diagnosis present

## 2019-08-10 DIAGNOSIS — Z20828 Contact with and (suspected) exposure to other viral communicable diseases: Secondary | ICD-10-CM | POA: Diagnosis present

## 2019-08-10 DIAGNOSIS — Z794 Long term (current) use of insulin: Secondary | ICD-10-CM

## 2019-08-10 HISTORY — PX: ROBOTIC ASSITED PARTIAL NEPHRECTOMY: SHX6087

## 2019-08-10 LAB — CBC
HCT: 36 % — ABNORMAL LOW (ref 39.0–52.0)
Hemoglobin: 12.2 g/dL — ABNORMAL LOW (ref 13.0–17.0)
MCH: 30.6 pg (ref 26.0–34.0)
MCHC: 33.9 g/dL (ref 30.0–36.0)
MCV: 90.2 fL (ref 80.0–100.0)
Platelets: 299 10*3/uL (ref 150–400)
RBC: 3.99 MIL/uL — ABNORMAL LOW (ref 4.22–5.81)
RDW: 16 % — ABNORMAL HIGH (ref 11.5–15.5)
WBC: 6.1 10*3/uL (ref 4.0–10.5)
nRBC: 0 % (ref 0.0–0.2)

## 2019-08-10 LAB — TYPE AND SCREEN
ABO/RH(D): O NEG
Antibody Screen: NEGATIVE

## 2019-08-10 LAB — RESPIRATORY PANEL BY RT PCR (FLU A&B, COVID)
Influenza A by PCR: NEGATIVE
Influenza B by PCR: NEGATIVE
SARS Coronavirus 2 by RT PCR: NEGATIVE

## 2019-08-10 LAB — BASIC METABOLIC PANEL
Anion gap: 11 (ref 5–15)
Anion gap: 8 (ref 5–15)
BUN: 19 mg/dL (ref 8–23)
BUN: 20 mg/dL (ref 8–23)
CO2: 25 mmol/L (ref 22–32)
CO2: 28 mmol/L (ref 22–32)
Calcium: 9.5 mg/dL (ref 8.9–10.3)
Calcium: 9 mg/dL (ref 8.9–10.3)
Chloride: 104 mmol/L (ref 98–111)
Chloride: 106 mmol/L (ref 98–111)
Creatinine, Ser: 2.2 mg/dL — ABNORMAL HIGH (ref 0.61–1.24)
Creatinine, Ser: 2.31 mg/dL — ABNORMAL HIGH (ref 0.61–1.24)
GFR calc Af Amer: 34 mL/min — ABNORMAL LOW (ref >60)
GFR calc Af Amer: 32 mL/min — ABNORMAL LOW (ref >60)
GFR calc non Af Amer: 29 mL/min — ABNORMAL LOW (ref >60)
GFR calc non Af Amer: 28 mL/min — ABNORMAL LOW (ref >60)
Glucose, Bld: 87 mg/dL (ref 70–99)
Glucose, Bld: 147 mg/dL — ABNORMAL HIGH (ref 70–99)
Potassium: 3.9 mmol/L (ref 3.5–5.1)
Potassium: 4.6 mmol/L (ref 3.5–5.1)
Sodium: 140 mmol/L (ref 135–145)
Sodium: 142 mmol/L (ref 135–145)

## 2019-08-10 LAB — GLUCOSE, CAPILLARY
Glucose-Capillary: 74 mg/dL (ref 70–99)
Glucose-Capillary: 151 mg/dL — ABNORMAL HIGH (ref 70–99)
Glucose-Capillary: 166 mg/dL — ABNORMAL HIGH (ref 70–99)

## 2019-08-10 LAB — HEMOGLOBIN AND HEMATOCRIT, BLOOD
HCT: 35.7 % — ABNORMAL LOW (ref 39.0–52.0)
Hemoglobin: 11.8 g/dL — ABNORMAL LOW (ref 13.0–17.0)

## 2019-08-10 LAB — HEMOGLOBIN A1C
Hgb A1c MFr Bld: 6.9 % — ABNORMAL HIGH (ref 4.8–5.6)
Mean Plasma Glucose: 151.33 mg/dL

## 2019-08-10 LAB — ABO/RH: ABO/RH(D): O NEG

## 2019-08-10 SURGERY — NEPHRECTOMY, PARTIAL, ROBOT-ASSISTED
Anesthesia: General | Laterality: Left

## 2019-08-10 MED ORDER — CARVEDILOL 25 MG PO TABS
25.0000 mg | ORAL_TABLET | Freq: Two times a day (BID) | ORAL | Status: DC
  Administered 2019-08-10 – 2019-08-12 (×4): 25 mg via ORAL
  Filled 2019-08-10 (×4): qty 1

## 2019-08-10 MED ORDER — SODIUM CHLORIDE 0.9 % IV SOLN
INTRAVENOUS | Status: DC
  Administered 2019-08-10: 21:00:00 via INTRAVENOUS

## 2019-08-10 MED ORDER — CEFAZOLIN SODIUM-DEXTROSE 1-4 GM/50ML-% IV SOLN
1.0000 g | Freq: Three times a day (TID) | INTRAVENOUS | Status: AC
  Administered 2019-08-10 – 2019-08-11 (×2): 1 g via INTRAVENOUS
  Filled 2019-08-10 (×2): qty 50

## 2019-08-10 MED ORDER — FENOFIBRATE 160 MG PO TABS
160.0000 mg | ORAL_TABLET | Freq: Every day | ORAL | Status: DC
  Administered 2019-08-11 – 2019-08-12 (×2): 160 mg via ORAL
  Filled 2019-08-10 (×2): qty 1

## 2019-08-10 MED ORDER — OXYCODONE HCL 5 MG/5ML PO SOLN: 5.0000 mg | Freq: Once | ORAL | Status: DC | PRN

## 2019-08-10 MED ORDER — EPHEDRINE SULFATE-NACL 50-0.9 MG/10ML-% IV SOSY
PREFILLED_SYRINGE | INTRAVENOUS | Status: DC | PRN
  Administered 2019-08-10: 10 mg via INTRAVENOUS

## 2019-08-10 MED ORDER — DEXAMETHASONE SODIUM PHOSPHATE 10 MG/ML IJ SOLN
INTRAMUSCULAR | Status: DC | PRN
  Administered 2019-08-10: 10 mg via INTRAVENOUS

## 2019-08-10 MED ORDER — ROCURONIUM BROMIDE 10 MG/ML (PF) SYRINGE
PREFILLED_SYRINGE | INTRAVENOUS | Status: DC | PRN
  Administered 2019-08-10: 60 mg via INTRAVENOUS
  Administered 2019-08-10: 20 mg via INTRAVENOUS
  Administered 2019-08-10: 40 mg via INTRAVENOUS

## 2019-08-10 MED ORDER — LIDOCAINE 2% (20 MG/ML) 5 ML SYRINGE
INTRAMUSCULAR | Status: AC
  Filled 2019-08-10: qty 5

## 2019-08-10 MED ORDER — FENTANYL CITRATE (PF) 250 MCG/5ML IJ SOLN
INTRAMUSCULAR | Status: AC
  Filled 2019-08-10: qty 5

## 2019-08-10 MED ORDER — AMLODIPINE BESYLATE 10 MG PO TABS
10.0000 mg | ORAL_TABLET | Freq: Every day | ORAL | Status: DC
  Administered 2019-08-11 – 2019-08-12 (×2): 10 mg via ORAL
  Filled 2019-08-10 (×2): qty 1

## 2019-08-10 MED ORDER — TAMSULOSIN HCL 0.4 MG PO CAPS
0.4000 mg | ORAL_CAPSULE | Freq: Every day | ORAL | Status: DC
  Administered 2019-08-11 – 2019-08-12 (×2): 0.4 mg via ORAL
  Filled 2019-08-10 (×2): qty 1

## 2019-08-10 MED ORDER — GLYCOPYRROLATE PF 0.2 MG/ML IJ SOSY
PREFILLED_SYRINGE | INTRAMUSCULAR | Status: AC
  Filled 2019-08-10: qty 1

## 2019-08-10 MED ORDER — SODIUM CHLORIDE (PF) 0.9 % IJ SOLN
INTRAMUSCULAR | Status: DC | PRN
  Administered 2019-08-10: 20 mL

## 2019-08-10 MED ORDER — FUROSEMIDE 20 MG PO TABS
10.0000 mg | ORAL_TABLET | Freq: Every day | ORAL | Status: DC
  Administered 2019-08-10 – 2019-08-12 (×3): 10 mg via ORAL
  Filled 2019-08-10 (×3): qty 1

## 2019-08-10 MED ORDER — ONDANSETRON HCL 4 MG/2ML IJ SOLN
INTRAMUSCULAR | Status: AC
  Filled 2019-08-10: qty 2

## 2019-08-10 MED ORDER — ZIPRASIDONE HCL 80 MG PO CAPS
80.0000 mg | ORAL_CAPSULE | Freq: Two times a day (BID) | ORAL | Status: DC
  Administered 2019-08-10 – 2019-08-12 (×4): 80 mg via ORAL
  Filled 2019-08-10 (×5): qty 1

## 2019-08-10 MED ORDER — ONDANSETRON HCL 4 MG/2ML IJ SOLN
INTRAMUSCULAR | Status: DC | PRN
  Administered 2019-08-10: 4 mg via INTRAVENOUS

## 2019-08-10 MED ORDER — ACETAMINOPHEN 10 MG/ML IV SOLN
1000.0000 mg | Freq: Four times a day (QID) | INTRAVENOUS | Status: AC
  Administered 2019-08-11 (×4): 1000 mg via INTRAVENOUS
  Filled 2019-08-10 (×4): qty 100

## 2019-08-10 MED ORDER — ONDANSETRON HCL 4 MG/2ML IJ SOLN: 4.0000 mg | INTRAMUSCULAR | Status: DC | PRN

## 2019-08-10 MED ORDER — GLYCOPYRROLATE PF 0.2 MG/ML IJ SOSY
PREFILLED_SYRINGE | INTRAMUSCULAR | Status: DC | PRN
  Administered 2019-08-10: .2 mg via INTRAVENOUS

## 2019-08-10 MED ORDER — PROPOFOL 10 MG/ML IV BOLUS
INTRAVENOUS | Status: DC | PRN
  Administered 2019-08-10: 130 mg via INTRAVENOUS

## 2019-08-10 MED ORDER — TRAMADOL HCL 50 MG PO TABS: 50.0000 mg | ORAL_TABLET | Freq: Four times a day (QID) | ORAL | 0 refills | Status: DC | PRN

## 2019-08-10 MED ORDER — INSULIN ASPART 100 UNIT/ML ~~LOC~~ SOLN
0.0000 [IU] | SUBCUTANEOUS | Status: DC
  Administered 2019-08-10: 3 [IU] via SUBCUTANEOUS
  Administered 2019-08-11 (×2): 2 [IU] via SUBCUTANEOUS
  Administered 2019-08-11 (×3): 3 [IU] via SUBCUTANEOUS
  Administered 2019-08-12 (×3): 2 [IU] via SUBCUTANEOUS

## 2019-08-10 MED ORDER — ATROPINE SULFATE 0.4 MG/ML IV SOSY
PREFILLED_SYRINGE | INTRAVENOUS | Status: DC | PRN
  Administered 2019-08-10 (×2): .4 mg via INTRAVENOUS

## 2019-08-10 MED ORDER — MORPHINE SULFATE (PF) 4 MG/ML IV SOLN: 2.0000 mg | INTRAVENOUS | Status: DC | PRN

## 2019-08-10 MED ORDER — GLYCOPYRROLATE 0.2 MG/ML IJ SOLN
INTRAMUSCULAR | Status: DC | PRN
  Administered 2019-08-10: .2 mg via INTRAVENOUS

## 2019-08-10 MED ORDER — VALSARTAN-HYDROCHLOROTHIAZIDE 320-25 MG PO TABS: 1.0000 | ORAL_TABLET | Freq: Every day | ORAL | Status: DC

## 2019-08-10 MED ORDER — ONDANSETRON HCL 4 MG/2ML IJ SOLN: 4.0000 mg | Freq: Four times a day (QID) | INTRAMUSCULAR | Status: DC | PRN

## 2019-08-10 MED ORDER — SUGAMMADEX SODIUM 200 MG/2ML IV SOLN
INTRAVENOUS | Status: DC | PRN
  Administered 2019-08-10: 400 mg via INTRAVENOUS

## 2019-08-10 MED ORDER — FENTANYL CITRATE (PF) 250 MCG/5ML IJ SOLN
INTRAMUSCULAR | Status: DC | PRN
  Administered 2019-08-10: 50 ug via INTRAVENOUS
  Administered 2019-08-10: 100 ug via INTRAVENOUS
  Administered 2019-08-10: 50 ug via INTRAVENOUS

## 2019-08-10 MED ORDER — BUPIVACAINE LIPOSOME 1.3 % IJ SUSP
20.0000 mL | Freq: Once | INTRAMUSCULAR | Status: AC
  Administered 2019-08-10: 17:00:00 20 mL
  Filled 2019-08-10: qty 20

## 2019-08-10 MED ORDER — CEFAZOLIN SODIUM-DEXTROSE 2-3 GM-%(50ML) IV SOLR: INTRAVENOUS | Status: DC | PRN

## 2019-08-10 MED ORDER — LACTATED RINGERS IV SOLN
INTRAVENOUS | Status: DC
  Administered 2019-08-10 (×2): via INTRAVENOUS

## 2019-08-10 MED ORDER — ROCURONIUM BROMIDE 10 MG/ML (PF) SYRINGE
PREFILLED_SYRINGE | INTRAVENOUS | Status: AC
  Filled 2019-08-10: qty 10

## 2019-08-10 MED ORDER — LACTATED RINGERS IV SOLN
INTRAVENOUS | Status: DC | PRN
  Administered 2019-08-10: 13:00:00 via INTRAVENOUS

## 2019-08-10 MED ORDER — IRBESARTAN 300 MG PO TABS
300.0000 mg | ORAL_TABLET | Freq: Every day | ORAL | Status: DC
  Administered 2019-08-10 – 2019-08-12 (×3): 300 mg via ORAL
  Filled 2019-08-10 (×3): qty 1

## 2019-08-10 MED ORDER — PROPOFOL 10 MG/ML IV BOLUS
INTRAVENOUS | Status: AC
  Filled 2019-08-10: qty 20

## 2019-08-10 MED ORDER — OXYCODONE HCL 5 MG PO TABS: 5.0000 mg | ORAL_TABLET | Freq: Once | ORAL | Status: DC | PRN

## 2019-08-10 MED ORDER — SODIUM CHLORIDE (PF) 0.9 % IJ SOLN
INTRAMUSCULAR | Status: AC
  Filled 2019-08-10: qty 20

## 2019-08-10 MED ORDER — FENTANYL CITRATE (PF) 100 MCG/2ML IJ SOLN: 25.0000 ug | INTRAMUSCULAR | Status: DC | PRN

## 2019-08-10 MED ORDER — CEFAZOLIN SODIUM-DEXTROSE 2-4 GM/100ML-% IV SOLN
2.0000 g | Freq: Once | INTRAVENOUS | Status: AC
  Administered 2019-08-10: 2 g via INTRAVENOUS
  Filled 2019-08-10: qty 100

## 2019-08-10 MED ORDER — SUCCINYLCHOLINE CHLORIDE 200 MG/10ML IV SOSY
PREFILLED_SYRINGE | INTRAVENOUS | Status: AC
  Filled 2019-08-10: qty 10

## 2019-08-10 MED ORDER — LAMOTRIGINE 100 MG PO TABS
100.0000 mg | ORAL_TABLET | Freq: Two times a day (BID) | ORAL | Status: DC
  Administered 2019-08-10 – 2019-08-12 (×4): 100 mg via ORAL
  Filled 2019-08-10 (×4): qty 1

## 2019-08-10 MED ORDER — PHENYLEPHRINE HCL-NACL 10-0.9 MG/250ML-% IV SOLN
INTRAVENOUS | Status: DC | PRN
  Administered 2019-08-10: 25 ug/min via INTRAVENOUS

## 2019-08-10 MED ORDER — PANTOPRAZOLE SODIUM 40 MG PO TBEC
40.0000 mg | DELAYED_RELEASE_TABLET | Freq: Every day | ORAL | Status: DC
  Administered 2019-08-11 – 2019-08-12 (×2): 40 mg via ORAL
  Filled 2019-08-10 (×2): qty 1

## 2019-08-10 MED ORDER — CHLORHEXIDINE GLUCONATE CLOTH 2 % EX PADS: 6.0000 | MEDICATED_PAD | Freq: Every day | CUTANEOUS | Status: DC

## 2019-08-10 MED ORDER — DOCUSATE SODIUM 100 MG PO CAPS
100.0000 mg | ORAL_CAPSULE | Freq: Two times a day (BID) | ORAL | Status: DC
  Administered 2019-08-10 – 2019-08-12 (×4): 100 mg via ORAL
  Filled 2019-08-10 (×4): qty 1

## 2019-08-10 MED ORDER — LIDOCAINE 2% (20 MG/ML) 5 ML SYRINGE
INTRAMUSCULAR | Status: DC | PRN
  Administered 2019-08-10: 80 mg via INTRAVENOUS

## 2019-08-10 MED ORDER — VALBENAZINE TOSYLATE 40 MG PO CAPS: 40.0000 mg | ORAL_CAPSULE | Freq: Every day | ORAL | Status: DC

## 2019-08-10 MED ORDER — DIPHENHYDRAMINE HCL 12.5 MG/5ML PO ELIX: 12.5000 mg | ORAL_SOLUTION | Freq: Four times a day (QID) | ORAL | Status: DC | PRN

## 2019-08-10 MED ORDER — DIPHENHYDRAMINE HCL 50 MG/ML IJ SOLN: 12.5000 mg | Freq: Four times a day (QID) | INTRAMUSCULAR | Status: DC | PRN

## 2019-08-10 MED ORDER — HYDROCHLOROTHIAZIDE 25 MG PO TABS
25.0000 mg | ORAL_TABLET | Freq: Every day | ORAL | Status: DC
  Administered 2019-08-10 – 2019-08-12 (×3): 25 mg via ORAL
  Filled 2019-08-10 (×3): qty 1

## 2019-08-10 MED ORDER — MAGNESIUM CITRATE PO SOLN
1.0000 | Freq: Once | ORAL | Status: AC
  Administered 2019-08-10: 1 via ORAL
  Filled 2019-08-10: qty 296

## 2019-08-10 MED ORDER — DEXAMETHASONE SODIUM PHOSPHATE 10 MG/ML IJ SOLN
INTRAMUSCULAR | Status: AC
  Filled 2019-08-10: qty 1

## 2019-08-10 SURGICAL SUPPLY — 61 items
APPLICATOR SURGIFLO ENDO (HEMOSTASIS) ×2 IMPLANT
BAG LAPAROSCOPIC 12 15 PORT 16 (BASKET) ×1 IMPLANT
BAG RETRIEVAL 12/15 (BASKET) ×2
CHLORAPREP W/TINT 26 (MISCELLANEOUS) ×2 IMPLANT
CLIP VESOLOCK LG 6/CT PURPLE (CLIP) ×2 IMPLANT
CLIP VESOLOCK MED LG 6/CT (CLIP) ×6 IMPLANT
COVER SURGICAL LIGHT HANDLE (MISCELLANEOUS) ×2 IMPLANT
COVER TIP SHEARS 8 DVNC (MISCELLANEOUS) ×1 IMPLANT
COVER TIP SHEARS 8MM DA VINCI (MISCELLANEOUS) ×1
COVER WAND RF STERILE (DRAPES) IMPLANT
DECANTER SPIKE VIAL GLASS SM (MISCELLANEOUS) ×2 IMPLANT
DERMABOND ADVANCED (GAUZE/BANDAGES/DRESSINGS) ×1
DERMABOND ADVANCED .7 DNX12 (GAUZE/BANDAGES/DRESSINGS) ×1 IMPLANT
DRAIN CHANNEL 15F RND FF 3/16 (WOUND CARE) ×2 IMPLANT
DRAPE ARM DVNC X/XI (DISPOSABLE) ×4 IMPLANT
DRAPE COLUMN DVNC XI (DISPOSABLE) ×1 IMPLANT
DRAPE DA VINCI XI ARM (DISPOSABLE) ×4
DRAPE DA VINCI XI COLUMN (DISPOSABLE) ×1
DRAPE INCISE IOBAN 66X45 STRL (DRAPES) ×2 IMPLANT
DRAPE SHEET LG 3/4 BI-LAMINATE (DRAPES) ×2 IMPLANT
DRSG TEGADERM 4X4.75 (GAUZE/BANDAGES/DRESSINGS) ×2 IMPLANT
ELECT REM PT RETURN 15FT ADLT (MISCELLANEOUS) ×2 IMPLANT
EVACUATOR SILICONE 100CC (DRAIN) ×2 IMPLANT
GLOVE BIO SURGEON STRL SZ 6.5 (GLOVE) ×2 IMPLANT
GLOVE BIO SURGEON STRL SZ8 (GLOVE) ×2 IMPLANT
GLOVE BIOGEL M STRL SZ7.5 (GLOVE) ×4 IMPLANT
GLOVE BIOGEL PI IND STRL 8.5 (GLOVE) ×1 IMPLANT
GLOVE BIOGEL PI INDICATOR 8.5 (GLOVE) ×1
GOWN STRL REUS W/TWL LRG LVL3 (GOWN DISPOSABLE) ×6 IMPLANT
GOWN STRL REUS W/TWL XL LVL3 (GOWN DISPOSABLE) ×2 IMPLANT
HEMOSTAT SURGICEL 4X8 (HEMOSTASIS) ×2 IMPLANT
HOLDER FOLEY CATH W/STRAP (MISCELLANEOUS) ×2 IMPLANT
IRRIG SUCT STRYKERFLOW 2 WTIP (MISCELLANEOUS) ×2
IRRIGATION SUCT STRKRFLW 2 WTP (MISCELLANEOUS) ×1 IMPLANT
IV LACTATED RINGERS 1000ML (IV SOLUTION) ×2 IMPLANT
KIT BASIN OR (CUSTOM PROCEDURE TRAY) ×2 IMPLANT
KIT TURNOVER KIT A (KITS) ×2 IMPLANT
PENCIL SMOKE EVACUATOR (MISCELLANEOUS) IMPLANT
POUCH SPECIMEN RETRIEVAL 10MM (ENDOMECHANICALS) IMPLANT
PROTECTOR NERVE ULNAR (MISCELLANEOUS) ×4 IMPLANT
SEAL CANN UNIV 5-8 DVNC XI (MISCELLANEOUS) ×4 IMPLANT
SEAL XI 5MM-8MM UNIVERSAL (MISCELLANEOUS) ×4
SET TUBE SMOKE EVAC HIGH FLOW (TUBING) ×2 IMPLANT
SOLUTION ELECTROLUBE (MISCELLANEOUS) ×2 IMPLANT
SURGIFLO W/THROMBIN 8M KIT (HEMOSTASIS) ×2 IMPLANT
SUT ETHILON 3 0 PS 1 (SUTURE) ×2 IMPLANT
SUT MNCRL AB 4-0 PS2 18 (SUTURE) ×4 IMPLANT
SUT PDS AB 1 TP1 96 (SUTURE) ×4 IMPLANT
SUT V-LOC BARB 180 2/0GR6 GS22 (SUTURE) ×4
SUT VIC AB 0 CT1 27 (SUTURE) ×1
SUT VIC AB 0 CT1 27XBRD ANTBC (SUTURE) ×1 IMPLANT
SUT VICRYL 0 UR6 27IN ABS (SUTURE) ×2 IMPLANT
SUT VLOC BARB 180 ABS3/0GR12 (SUTURE) ×6
SUTURE V-LC BRB 180 2/0GR6GS22 (SUTURE) ×2 IMPLANT
SUTURE VLOC BRB 180 ABS3/0GR12 (SUTURE) ×3 IMPLANT
TOWEL OR 17X26 10 PK STRL BLUE (TOWEL DISPOSABLE) ×2 IMPLANT
TRAY FOLEY MTR SLVR 16FR STAT (SET/KITS/TRAYS/PACK) ×2 IMPLANT
TRAY LAPAROSCOPIC (CUSTOM PROCEDURE TRAY) ×2 IMPLANT
TROCAR 12M 150ML BLUNT (TROCAR) ×2 IMPLANT
TROCAR XCEL 12X100 BLDLESS (ENDOMECHANICALS) ×2 IMPLANT
WATER STERILE IRR 1000ML POUR (IV SOLUTION) ×2 IMPLANT

## 2019-08-10 NOTE — Discharge Instructions (Signed)

## 2019-08-10 NOTE — Anesthesia Preprocedure Evaluation (Signed)
Anesthesia Evaluation  Patient identified by MRN, date of birth, ID band Patient awake    Reviewed: Allergy & Precautions, H&P , NPO status , Patient's Chart, lab work & pertinent test results  Airway Mallampati: II   Neck ROM: full    Dental   Pulmonary Current Smoker,    breath sounds clear to auscultation       Cardiovascular hypertension,  Rhythm:regular Rate:Normal     Neuro/Psych PSYCHIATRIC DISORDERS    GI/Hepatic   Endo/Other  diabetes, Type 2, Insulin Dependent  Renal/GU Renal InsufficiencyRenal diseaseRenal cell CA     Musculoskeletal   Abdominal   Peds  Hematology   Anesthesia Other Findings   Reproductive/Obstetrics                             Anesthesia Physical Anesthesia Plan  ASA: III  Anesthesia Plan: General   Post-op Pain Management:    Induction: Intravenous  PONV Risk Score and Plan: 1 and Ondansetron, Dexamethasone, Midazolam and Treatment may vary due to age or medical condition  Airway Management Planned: Oral ETT  Additional Equipment:   Intra-op Plan:   Post-operative Plan: Extubation in OR  Informed Consent: I have reviewed the patients History and Physical, chart, labs and discussed the procedure including the risks, benefits and alternatives for the proposed anesthesia with the patient or authorized representative who has indicated his/her understanding and acceptance.       Plan Discussed with: CRNA, Anesthesiologist and Surgeon  Anesthesia Plan Comments:         Anesthesia Quick Evaluation

## 2019-08-10 NOTE — Anesthesia Procedure Notes (Signed)
Procedure Name: Intubation Date/Time: 08/10/2019 12:58 PM Performed by: Mitzie Na, CRNA Pre-anesthesia Checklist: Patient identified, Emergency Drugs available, Suction available and Patient being monitored Patient Re-evaluated:Patient Re-evaluated prior to induction Oxygen Delivery Method: Circle system utilized Preoxygenation: Pre-oxygenation with 100% oxygen Induction Type: IV induction Ventilation: Mask ventilation without difficulty Laryngoscope Size: Mac and 4 Grade View: Grade I Tube type: Oral Tube size: 7.5 mm Number of attempts: 1 Airway Equipment and Method: Stylet and Oral airway Placement Confirmation: ETT inserted through vocal cords under direct vision,  positive ETCO2 and breath sounds checked- equal and bilateral Secured at: 25 cm Tube secured with: Tape Dental Injury: Teeth and Oropharynx as per pre-operative assessment

## 2019-08-10 NOTE — Op Note (Signed)
Preoperative diagnosis: Left renal neoplasm  Postoperative diagnosis: Left renal neoplasm  Procedure:  1. Left robotic-assisted laparoscopic partial nephrectomy 2. Intraoperative renal ultrasonography  Surgeon: Pryor Curia. M.D.  Assistant(s): Debbrah Alar, PA-C  An assistant was required for this surgical procedure.  The duties of the assistant included but were not limited to suctioning, passing suture, camera manipulation, retraction. This procedure would not be able to be performed without an Environmental consultant.  Resident: Dr. Sharlot Gowda  Anesthesia: General  Complications: None  EBL: 50 mL  IVF:  1400 mL crystalloid  Specimens: 1. Left renal neoplasm  Disposition of specimens: Pathology  Intraoperative findings:       1. Warm renal ischemia time: 21 minutes       2. Intraoperative renal ultrasound findings: Large, solid heterogenous 7.5 cm tumor off lower pole of left kidney was noted.  Drains: 1. # 15 Blake perinephric drain  Indication:  Riley Stuart is a 68 y.o. year old patient with a left renal neoplasm.  After a thorough review of the management options for their renal mass, they elected to proceed with surgical treatment and the above procedure.  We have discussed the potential benefits and risks of the procedure, side effects of the proposed treatment, the likelihood of the patient achieving the goals of the procedure, and any potential problems that might occur during the procedure or recuperation. Informed consent has been obtained.   Description of procedure:  The patient was taken to the operating room and a general anesthetic was administered. The patient was given preoperative antibiotics, placed in the left modified flank position with care to pad all potential pressure points, and prepped and draped in the usual sterile fashion. Next a preoperative timeout was performed.  A site was selected in the upper midline for initial port placement.  This was placed using a standard open Hassan technique which allowed entry into the peritoneal cavity under direct vision and without difficulty. A 12 mm port was placed and a pneumoperitoneum established. The camera was then used to inspect the abdomen and there was no evidence of any intra-abdominal injuries or other abnormalities. The remaining abdominal ports were then placed. 8 mm robotic ports were placed in the left upper quadrant, left lower quadrant, and far left lateral abdominal wall. A 8 mm port was placed to the left of the midline just off the rectus muscle for the camera.. All ports were placed under direct vision without difficulty. The surgical cart was then docked.   Utilizing the cautery scissors, the white line of Toldt was incised allowing the colon to be mobilized medially and the plane between the mesocolon and the anterior layer of Gerota's fascia to be developed and the kidney to be exposed.  The ureter and gonadal vein were identified inferiorly and the ureter was lifted anteriorly off the psoas muscle.  Dissection proceeded superiorly along the gonadal vein until the renal vein was identified.  The renal hilum was then carefully isolated with a combination of blunt and sharp dissection allowing the renal arterial and venous structures to be separated and isolated in preparation for renal hilar vessel clamping. There was a single renal artery and renal vein.   Attention turned to the kidney and the perinephric fat surrounding the renal mass was removed and the kidney was mobilized sufficiently for exposure and resection of the renal mass.   Intraoperative renal ultrasonography was utilized with the laparoscopic ultrasound probe to identify the renal tumor and identify the tumor  margins.   Once the renal mass was properly isolated, preparations were made for resection of the tumor.  Reconstructive sutures were placed into the abdomen for the renorrhaphy portion of the procedure.  The  renal artery was then clamped with bulldog clamps.  The tumor was then excised with cold scissor dissection along with an adequate visible gross margin of normal renal parenchyma. The tumor appeared to be excised without any gross violation of the tumor. The renal collecting system was entered during removal of the tumor.  Resection required essentially a heminephrectomy with the lower half amputated.  A running 3-0 V-lock suture was then brought through the capsule of the kidney and run along the base of the renal defect to provide hemostasis and close any entry into the renal collecting system if present. Weck clips were used to secure this suture outside the renal capsule at the proximal and distal ends. An additional hemostatic agent (Surgiflo) was then placed into the renal defect. A running 2-0 V lock suture was then used to close the renal capsule using a sliding clip technique which resulted in excellent compression of the renal defect.  A small piece of surgicel was applied over the defect.  The bulldog clamps were then removed from the renal hilar vessel(s). Total warm renal ischemia time was 21 minutes. The renal tumor resection site was examined. Hemostasis appeared adequate.   The kidney was placed back into its normal anatomic position and covered with perinephric fat as needed.  A # 71 Blake drain was then brought through the lateral lower port site and positioned in the perinephric space.  It was secured to the skin with a nylon suture. The surgical cart was undocked.  The renal tumor specimen was removed intact within an endopouch retrieval bag via the upper midline port site.  All other laparoscopic/robotic ports had been removed under direct vision and the pneumoperitoneum let down with inspection of the operative field performed and hemostasis again confirmed. The upper midline incision was then closed at the fascial level with 0-vicryl suture. All incision sites were then injected with local  anesthetic and reapproximated at the skin level with 4-0 monocryl subcuticular closures.  Liquiband was applied to the skin.  The patient tolerated the procedure well and without complications.  The patient was able to be extubated and transferred to the recovery unit in satisfactory condition.  Pryor Curia MD

## 2019-08-10 NOTE — Transfer of Care (Signed)
Immediate Anesthesia Transfer of Care Note  Patient: Riley Stuart  Procedure(s) Performed: Procedure(s): XI ROBOTIC ASSITED LAPAROSCOPIC  PARTIAL NEPHRECTOMY (Left)  Patient Location: PACU  Anesthesia Type:General  Level of Consciousness:  sedated, patient cooperative and responds to stimulation  Airway & Oxygen Therapy:Patient Spontanous Breathing and Patient connected to face mask oxgen  Post-op Assessment:  Report given to PACU RN and Post -op Vital signs reviewed and stable  Post vital signs:  Reviewed and stable  Last Vitals:  Vitals:   08/10/19 1025  BP: (!) 146/71  Pulse: (!) 45  Resp: 20  Temp: 36.8 C  SpO2: 903%    Complications: No apparent anesthesia complications

## 2019-08-11 ENCOUNTER — Encounter: Payer: Self-pay | Admitting: *Deleted

## 2019-08-11 DIAGNOSIS — Z794 Long term (current) use of insulin: Secondary | ICD-10-CM | POA: Diagnosis not present

## 2019-08-11 DIAGNOSIS — I129 Hypertensive chronic kidney disease with stage 1 through stage 4 chronic kidney disease, or unspecified chronic kidney disease: Secondary | ICD-10-CM | POA: Diagnosis present

## 2019-08-11 DIAGNOSIS — C642 Malignant neoplasm of left kidney, except renal pelvis: Secondary | ICD-10-CM | POA: Diagnosis present

## 2019-08-11 DIAGNOSIS — Z20828 Contact with and (suspected) exposure to other viral communicable diseases: Secondary | ICD-10-CM | POA: Diagnosis present

## 2019-08-11 DIAGNOSIS — F17210 Nicotine dependence, cigarettes, uncomplicated: Secondary | ICD-10-CM | POA: Diagnosis present

## 2019-08-11 DIAGNOSIS — N189 Chronic kidney disease, unspecified: Secondary | ICD-10-CM | POA: Diagnosis present

## 2019-08-11 DIAGNOSIS — E1122 Type 2 diabetes mellitus with diabetic chronic kidney disease: Secondary | ICD-10-CM | POA: Diagnosis present

## 2019-08-11 LAB — GLUCOSE, CAPILLARY
Glucose-Capillary: 131 mg/dL — ABNORMAL HIGH (ref 70–99)
Glucose-Capillary: 112 mg/dL — ABNORMAL HIGH (ref 70–99)
Glucose-Capillary: 151 mg/dL — ABNORMAL HIGH (ref 70–99)
Glucose-Capillary: 158 mg/dL — ABNORMAL HIGH (ref 70–99)
Glucose-Capillary: 122 mg/dL — ABNORMAL HIGH (ref 70–99)
Glucose-Capillary: 160 mg/dL — ABNORMAL HIGH (ref 70–99)

## 2019-08-11 LAB — BASIC METABOLIC PANEL
Anion gap: 9 (ref 5–15)
BUN: 22 mg/dL (ref 8–23)
CO2: 25 mmol/L (ref 22–32)
Calcium: 9 mg/dL (ref 8.9–10.3)
Chloride: 106 mmol/L (ref 98–111)
Creatinine, Ser: 2.47 mg/dL — ABNORMAL HIGH (ref 0.61–1.24)
GFR calc Af Amer: 30 mL/min — ABNORMAL LOW (ref >60)
GFR calc non Af Amer: 26 mL/min — ABNORMAL LOW (ref >60)
Glucose, Bld: 142 mg/dL — ABNORMAL HIGH (ref 70–99)
Potassium: 4.2 mmol/L (ref 3.5–5.1)
Sodium: 140 mmol/L (ref 135–145)

## 2019-08-11 LAB — HEMOGLOBIN AND HEMATOCRIT, BLOOD
HCT: 34.3 % — ABNORMAL LOW (ref 39.0–52.0)
Hemoglobin: 11.5 g/dL — ABNORMAL LOW (ref 13.0–17.0)

## 2019-08-11 MED ORDER — BISACODYL 10 MG RE SUPP
10.0000 mg | Freq: Once | RECTAL | Status: AC
  Administered 2019-08-11: 10 mg via RECTAL
  Filled 2019-08-11: qty 1

## 2019-08-11 MED ORDER — TRAMADOL HCL 50 MG PO TABS
50.0000 mg | ORAL_TABLET | Freq: Four times a day (QID) | ORAL | Status: DC | PRN
  Administered 2019-08-11 – 2019-08-12 (×3): 50 mg via ORAL
  Filled 2019-08-11 (×3): qty 1

## 2019-08-11 NOTE — Progress Notes (Signed)
After removing foley patient voided  250cc's or, PVR showing 0cc's of urine.

## 2019-08-11 NOTE — Progress Notes (Addendum)
Patient ID: Riley Stuart, male   DOB: 12-09-1950, 68 y.o.   MRN: [id number]  1 Day Post-Op Subjective: Doing well.  Pain controlled.  Objective: Vital signs in last 24 hours: Temp:  [97.7 F (36.5 C)-98.9 F (37.2 C)] 98.9 F (37.2 C) (12/15 0528) Pulse Rate:  [45-102] 74 (12/15 0549) Resp:  [8-24] 18 (12/15 0549) BP: (131-166)/(71-96) 131/85 (12/15 0528) SpO2:  [94 %-100 %] 97 % (12/15 0549) Weight:  [105.1 kg] 105.1 kg (12/14 1025)  Intake/Output from previous day: 12/14 0701 - 12/15 0700 In: 4323.8 [P.O.:720; I.V.:3329.4; IV Piggyback:274.3] Out: 3400 [Urine:3150; Drains:200; Blood:50] Intake/Output this shift: No intake/output data recorded.  Physical Exam:  General: Alert and oriented CV: RRR Lungs: Clear Abdomen: Soft, ND, Minimal tenderness, no bowel sounds Incisions: C/D/I Ext: NT, No erythema  Lab Results: Recent Labs    08/10/19 1100 08/10/19 1750 08/11/19 0559  HGB 12.2* 11.8* 11.5*  HCT 36.0* 35.7* 34.3*   BMET Recent Labs    08/10/19 1750 08/11/19 0559  NA 142 140  K 4.6 4.2  CL 106 106  CO2 28 25  GLUCOSE 147* 142*  BUN 20 22  CREATININE 2.31* 2.47*  CALCIUM 9.0 9.0     Studies/Results: No results found.  Assessment/Plan: POD # 1 s/p left RAL partial nephrectomy - Ambulate, IS - Monitor renal function closely.  Will monitor closely for renal function. Continue AR II blocker for now. - Will remove catheter so patient can better ambulate but will monitor PVRs closely considering extremely high volume of urine in bladder at time of surgery.  May require replacement if there is concern. - Advance diet - Oral pain medication   LOS: 0 days   Dutch Gray 08/11/2019, 7:25 AM

## 2019-08-12 ENCOUNTER — Other Ambulatory Visit: Payer: Self-pay

## 2019-08-12 LAB — CREATININE, FLUID (PLEURAL, PERITONEAL, JP DRAINAGE): Creat, Fluid: 2.6 mg/dL

## 2019-08-12 LAB — GLUCOSE, CAPILLARY
Glucose-Capillary: 112 mg/dL — ABNORMAL HIGH (ref 70–99)
Glucose-Capillary: 125 mg/dL — ABNORMAL HIGH (ref 70–99)
Glucose-Capillary: 126 mg/dL — ABNORMAL HIGH (ref 70–99)
Glucose-Capillary: 126 mg/dL — ABNORMAL HIGH (ref 70–99)

## 2019-08-12 LAB — BASIC METABOLIC PANEL
Anion gap: 11 (ref 5–15)
BUN: 23 mg/dL (ref 8–23)
CO2: 23 mmol/L (ref 22–32)
Calcium: 9.1 mg/dL (ref 8.9–10.3)
Chloride: 109 mmol/L (ref 98–111)
Creatinine, Ser: 2.64 mg/dL — ABNORMAL HIGH (ref 0.61–1.24)
GFR calc Af Amer: 27 mL/min — ABNORMAL LOW (ref >60)
GFR calc non Af Amer: 24 mL/min — ABNORMAL LOW (ref >60)
Glucose, Bld: 121 mg/dL — ABNORMAL HIGH (ref 70–99)
Potassium: 4.3 mmol/L (ref 3.5–5.1)
Sodium: 143 mmol/L (ref 135–145)

## 2019-08-12 LAB — HEMOGLOBIN AND HEMATOCRIT, BLOOD
HCT: 34.6 % — ABNORMAL LOW (ref 39.0–52.0)
Hemoglobin: 11.3 g/dL — ABNORMAL LOW (ref 13.0–17.0)

## 2019-08-12 NOTE — Progress Notes (Signed)
Patient ID: Riley Stuart, male   DOB: 15-Sep-1950, 68 y.o.   MRN: [id number]  2 Days Post-Op Subjective: Doing well.  Passed TOV yesterday. Eating small amounts without nausea or emesis. Pain controlled. JP with minimal output  Objective: Vital signs in last 24 hours: Temp:  [98.3 F (36.8 C)-100.1 F (37.8 C)] 100.1 F (37.8 C) (12/16 0507) Pulse Rate:  [54-69] 69 (12/16 0507) Resp:  [18-20] 18 (12/16 0507) BP: (143-161)/(82-86) 161/84 (12/16 0507) SpO2:  [94 %-98 %] 94 % (12/16 0507)  Intake/Output from previous day: 12/15 0701 - 12/16 0700 In: 830 [P.O.:830] Out: 2455 [Urine:2375; Drains:80] Intake/Output this shift: No intake/output data recorded.  Physical Exam:  General: Alert and oriented CV: RRR Lungs: Clear Abdomen: Soft, ND, Minimal tenderness. JP with SS output Incisions: C/D/I Ext: NT, No erythema  Lab Results: Recent Labs    08/10/19 1100 08/10/19 1750 08/11/19 0559  HGB 12.2* 11.8* 11.5*  HCT 36.0* 35.7* 34.3*   BMET Recent Labs    08/10/19 1750 08/11/19 0559  NA 142 140  K 4.6 4.2  CL 106 106  CO2 28 25  GLUCOSE 147* 142*  BUN 20 22  CREATININE 2.31* 2.47*  CALCIUM 9.0 9.0     Studies/Results: No results found.  Assessment/Plan: POD # 2 s/p left RAL partial nephrectomy  - Follow up AM labs - Ambulate, IS - Void spontaneously - Advance diet - Will discuss JP creatinine v removal given low output.  - Oral pain medication   LOS: 1 day   Nancee Liter 08/12/2019, 7:15 AM

## 2019-08-12 NOTE — Anesthesia Postprocedure Evaluation (Signed)
Anesthesia Post Note  Patient: Riley Stuart  Procedure(s) Performed: XI ROBOTIC ASSITED LAPAROSCOPIC  PARTIAL NEPHRECTOMY (Left )     Patient location during evaluation: PACU Anesthesia Type: General Level of consciousness: awake and alert Pain management: pain level controlled Vital Signs Assessment: post-procedure vital signs reviewed and stable Respiratory status: spontaneous breathing, nonlabored ventilation, respiratory function stable and patient connected to nasal cannula oxygen Cardiovascular status: blood pressure returned to baseline and stable Postop Assessment: no apparent nausea or vomiting Anesthetic complications: no    Last Vitals:  Vitals:   08/11/19 2047 08/12/19 0507  BP: (!) 143/86 (!) 161/84  Pulse: (!) 54 69  Resp: 18 18  Temp: 37.1 C 37.8 C  SpO2: 95% 94%    Last Pain:  Vitals:   08/12/19 0145  TempSrc:   PainSc: Asleep                 Fotios Amos S

## 2019-08-12 NOTE — Discharge Summary (Signed)
  Date of admission: 08/10/2019  Date of discharge: 08/12/2019  Admission diagnosis: Left renal cell carcinoma  Discharge diagnosis: Left renal cell carcinoma  Secondary diagnoses: Chronic kidney disease  History and Physical: For full details, please see admission history and physical. Briefly, Riley Stuart is a 68 y.o. year old patient with a baseline Cr of 2.4.  He was found to have a large left renal mass that was biopsied and was positive for papillary renal cell carcinoma.   Hospital Course: He underwent a left robotic partial nephrectomy on 08/10/19.  His renal function was monitored and was minimally changed postoperatively.  He was able to begin ambulating and his diet was advanced.  His blood sugar was monitored and he remained on a sliding scale insulin.  His drain fluid was c/w serum and removed and he was stable for discharge on POD#2.  Laboratory values:  Recent Labs    08/10/19 1750 08/11/19 0559 08/12/19 0554  HGB 11.8* 11.5* 11.3*  HCT 35.7* 34.3* 34.6*   Recent Labs    08/11/19 0559 08/12/19 0554  CREATININE 2.47* 2.64*    Disposition: Home  Discharge instruction: The patient was instructed to be ambulatory but told to refrain from heavy lifting, strenuous activity, or driving.   Discharge medications:  Allergies as of 08/12/2019   No Known Allergies     Medication List    STOP taking these medications   calcium-vitamin D 500-200 MG-UNIT tablet Commonly known as: OSCAL WITH D     TAKE these medications   acetaminophen 500 MG tablet Commonly known as: TYLENOL Take 500 mg by mouth every 6 (six) hours as needed (pain).   amLODipine 10 MG tablet Commonly known as: NORVASC Take 10 mg by mouth daily.   carvedilol 25 MG tablet Commonly known as: COREG Take 25 mg by mouth 2 (two) times daily.   fenofibrate 145 MG tablet Commonly known as: TRICOR Take 145 mg by mouth daily.   furosemide 20 MG tablet Commonly known as: LASIX Take 10 mg  by mouth daily.   Ingrezza 40 MG Caps Generic drug: Valbenazine Tosylate Take 40 mg by mouth daily.   lamoTRIgine 100 MG tablet Commonly known as: LAMICTAL Take 100 mg by mouth 2 (two) times daily.   NovoLOG Mix 70/30 FlexPen (70-30) 100 UNIT/ML FlexPen Generic drug: insulin aspart protamine - aspart Inject 30 Units as directed daily.   omeprazole 20 MG capsule Commonly known as: PRILOSEC Take 20 mg by mouth daily.   tamsulosin 0.4 MG Caps capsule Commonly known as: FLOMAX Take 0.4 mg by mouth daily.   traMADol 50 MG tablet Commonly known as: Ultram Take 1-2 tablets (50-100 mg total) by mouth every 6 (six) hours as needed for moderate pain or severe pain.   valsartan-hydrochlorothiazide 320-25 MG tablet Commonly known as: DIOVAN-HCT Take 1 tablet by mouth daily.   ziprasidone 80 MG capsule Commonly known as: GEODON Take 80 mg by mouth 2 (two) times daily.       Followup:  Follow-up Information    Raynelle Bring, MD On 09/01/2019.   Specialty: Urology Why: at 9:00 Contact information: [place] [address] [phone]

## 2019-08-13 LAB — SURGICAL PATHOLOGY

## 2019-08-31 DIAGNOSIS — R05 Cough: Secondary | ICD-10-CM | POA: Diagnosis not present

## 2019-08-31 DIAGNOSIS — Z20828 Contact with and (suspected) exposure to other viral communicable diseases: Secondary | ICD-10-CM | POA: Diagnosis not present

## 2019-09-01 DIAGNOSIS — C642 Malignant neoplasm of left kidney, except renal pelvis: Secondary | ICD-10-CM | POA: Diagnosis not present

## 2019-09-02 DIAGNOSIS — R05 Cough: Secondary | ICD-10-CM | POA: Diagnosis not present

## 2019-09-02 DIAGNOSIS — Z20828 Contact with and (suspected) exposure to other viral communicable diseases: Secondary | ICD-10-CM | POA: Diagnosis not present

## 2019-09-16 DIAGNOSIS — R05 Cough: Secondary | ICD-10-CM | POA: Diagnosis not present

## 2019-09-16 DIAGNOSIS — Z20828 Contact with and (suspected) exposure to other viral communicable diseases: Secondary | ICD-10-CM | POA: Diagnosis not present

## 2019-09-23 DIAGNOSIS — Z79899 Other long term (current) drug therapy: Secondary | ICD-10-CM | POA: Diagnosis not present

## 2019-09-23 DIAGNOSIS — F259 Schizoaffective disorder, unspecified: Secondary | ICD-10-CM | POA: Diagnosis not present

## 2019-09-30 DIAGNOSIS — Z20828 Contact with and (suspected) exposure to other viral communicable diseases: Secondary | ICD-10-CM | POA: Diagnosis not present

## 2019-09-30 DIAGNOSIS — R05 Cough: Secondary | ICD-10-CM | POA: Diagnosis not present

## 2019-10-19 DIAGNOSIS — Z20828 Contact with and (suspected) exposure to other viral communicable diseases: Secondary | ICD-10-CM | POA: Diagnosis not present

## 2019-10-19 DIAGNOSIS — R0982 Postnasal drip: Secondary | ICD-10-CM | POA: Diagnosis not present

## 2019-10-19 DIAGNOSIS — E1122 Type 2 diabetes mellitus with diabetic chronic kidney disease: Secondary | ICD-10-CM | POA: Diagnosis not present

## 2019-10-19 DIAGNOSIS — I10 Essential (primary) hypertension: Secondary | ICD-10-CM | POA: Diagnosis not present

## 2019-10-21 DIAGNOSIS — E1122 Type 2 diabetes mellitus with diabetic chronic kidney disease: Secondary | ICD-10-CM | POA: Diagnosis not present

## 2019-10-21 DIAGNOSIS — R0982 Postnasal drip: Secondary | ICD-10-CM | POA: Diagnosis not present

## 2019-10-21 DIAGNOSIS — I10 Essential (primary) hypertension: Secondary | ICD-10-CM | POA: Diagnosis not present

## 2019-10-21 DIAGNOSIS — Z20828 Contact with and (suspected) exposure to other viral communicable diseases: Secondary | ICD-10-CM | POA: Diagnosis not present

## 2019-10-27 DIAGNOSIS — Z20828 Contact with and (suspected) exposure to other viral communicable diseases: Secondary | ICD-10-CM | POA: Diagnosis not present

## 2019-10-27 DIAGNOSIS — R0982 Postnasal drip: Secondary | ICD-10-CM | POA: Diagnosis not present

## 2019-10-27 DIAGNOSIS — E1122 Type 2 diabetes mellitus with diabetic chronic kidney disease: Secondary | ICD-10-CM | POA: Diagnosis not present

## 2019-10-27 DIAGNOSIS — I10 Essential (primary) hypertension: Secondary | ICD-10-CM | POA: Diagnosis not present

## 2019-11-04 DIAGNOSIS — J310 Chronic rhinitis: Secondary | ICD-10-CM | POA: Diagnosis not present

## 2019-11-04 DIAGNOSIS — Z20828 Contact with and (suspected) exposure to other viral communicable diseases: Secondary | ICD-10-CM | POA: Diagnosis not present

## 2019-11-11 DIAGNOSIS — I10 Essential (primary) hypertension: Secondary | ICD-10-CM | POA: Diagnosis not present

## 2019-11-16 DIAGNOSIS — E113212 Type 2 diabetes mellitus with mild nonproliferative diabetic retinopathy with macular edema, left eye: Secondary | ICD-10-CM | POA: Diagnosis not present

## 2019-11-18 DIAGNOSIS — J310 Chronic rhinitis: Secondary | ICD-10-CM | POA: Diagnosis not present

## 2019-11-18 DIAGNOSIS — Z20828 Contact with and (suspected) exposure to other viral communicable diseases: Secondary | ICD-10-CM | POA: Diagnosis not present

## 2019-12-02 DIAGNOSIS — R05 Cough: Secondary | ICD-10-CM | POA: Diagnosis not present

## 2019-12-02 DIAGNOSIS — Z20828 Contact with and (suspected) exposure to other viral communicable diseases: Secondary | ICD-10-CM | POA: Diagnosis not present

## 2019-12-03 DIAGNOSIS — R519 Headache, unspecified: Secondary | ICD-10-CM | POA: Diagnosis not present

## 2019-12-03 DIAGNOSIS — J310 Chronic rhinitis: Secondary | ICD-10-CM | POA: Diagnosis not present

## 2019-12-03 DIAGNOSIS — Z20828 Contact with and (suspected) exposure to other viral communicable diseases: Secondary | ICD-10-CM | POA: Diagnosis not present

## 2019-12-07 DIAGNOSIS — E1122 Type 2 diabetes mellitus with diabetic chronic kidney disease: Secondary | ICD-10-CM | POA: Diagnosis not present

## 2019-12-07 DIAGNOSIS — N2889 Other specified disorders of kidney and ureter: Secondary | ICD-10-CM | POA: Diagnosis not present

## 2019-12-07 DIAGNOSIS — I129 Hypertensive chronic kidney disease with stage 1 through stage 4 chronic kidney disease, or unspecified chronic kidney disease: Secondary | ICD-10-CM | POA: Diagnosis not present

## 2019-12-07 DIAGNOSIS — R809 Proteinuria, unspecified: Secondary | ICD-10-CM | POA: Diagnosis not present

## 2019-12-07 DIAGNOSIS — N1832 Chronic kidney disease, stage 3b: Secondary | ICD-10-CM | POA: Diagnosis not present

## 2019-12-16 DIAGNOSIS — Z79899 Other long term (current) drug therapy: Secondary | ICD-10-CM | POA: Diagnosis not present

## 2019-12-16 DIAGNOSIS — F411 Generalized anxiety disorder: Secondary | ICD-10-CM | POA: Diagnosis not present

## 2019-12-16 DIAGNOSIS — Z20828 Contact with and (suspected) exposure to other viral communicable diseases: Secondary | ICD-10-CM | POA: Diagnosis not present

## 2019-12-16 DIAGNOSIS — R05 Cough: Secondary | ICD-10-CM | POA: Diagnosis not present

## 2019-12-16 DIAGNOSIS — F251 Schizoaffective disorder, depressive type: Secondary | ICD-10-CM | POA: Diagnosis not present

## 2019-12-30 DIAGNOSIS — R05 Cough: Secondary | ICD-10-CM | POA: Diagnosis not present

## 2019-12-30 DIAGNOSIS — Z20828 Contact with and (suspected) exposure to other viral communicable diseases: Secondary | ICD-10-CM | POA: Diagnosis not present

## 2020-01-18 DIAGNOSIS — E119 Type 2 diabetes mellitus without complications: Secondary | ICD-10-CM | POA: Diagnosis not present

## 2020-01-18 DIAGNOSIS — I10 Essential (primary) hypertension: Secondary | ICD-10-CM | POA: Diagnosis not present

## 2020-01-18 DIAGNOSIS — E1122 Type 2 diabetes mellitus with diabetic chronic kidney disease: Secondary | ICD-10-CM | POA: Diagnosis not present

## 2020-01-18 DIAGNOSIS — G2401 Drug induced subacute dyskinesia: Secondary | ICD-10-CM | POA: Diagnosis not present

## 2020-01-18 DIAGNOSIS — E7800 Pure hypercholesterolemia, unspecified: Secondary | ICD-10-CM | POA: Diagnosis not present

## 2020-02-22 ENCOUNTER — Other Ambulatory Visit (HOSPITAL_COMMUNITY): Payer: Self-pay | Admitting: Urology

## 2020-02-22 DIAGNOSIS — C642 Malignant neoplasm of left kidney, except renal pelvis: Secondary | ICD-10-CM | POA: Diagnosis not present

## 2020-02-23 ENCOUNTER — Ambulatory Visit (HOSPITAL_COMMUNITY)
Admission: RE | Admit: 2020-02-23 | Discharge: 2020-02-23 | Disposition: A | Discharge status: Home / Self Care | Payer: Medicare Other | Source: Ambulatory Visit | Attending: Urology | Admitting: Urology

## 2020-02-23 ENCOUNTER — Other Ambulatory Visit: Payer: Self-pay

## 2020-02-23 DIAGNOSIS — C642 Malignant neoplasm of left kidney, except renal pelvis: Secondary | ICD-10-CM | POA: Diagnosis present

## 2020-02-23 MED ORDER — GADOBUTROL 1 MMOL/ML IV SOLN
10.0000 mL | Freq: Once | INTRAVENOUS | Status: AC | PRN
  Administered 2020-02-23: 10 mL via INTRAVENOUS

## 2020-02-25 ENCOUNTER — Other Ambulatory Visit: Payer: Self-pay

## 2020-02-25 ENCOUNTER — Ambulatory Visit (INDEPENDENT_AMBULATORY_CARE_PROVIDER_SITE_OTHER): Payer: Self-pay | Admitting: Gastroenterology

## 2020-02-25 DIAGNOSIS — Z1211 Encounter for screening for malignant neoplasm of colon: Secondary | ICD-10-CM

## 2020-02-25 NOTE — Progress Notes (Signed)
Gastroenterology Pre-Procedure Review  Request Date: 03/11/20 Requesting Physician: Dr. Bonna Gains  PATIENT REVIEW QUESTIONS: The patient responded to the following health history questions as indicated:    1. Are you having any GI issues? no 2. Do you have a personal history of Polyps? no 3. Do you have a family history of Colon Cancer or Polyps? no 4. Diabetes Mellitus? yes (Type 2 insulin) 5. Joint replacements in the past 12 months?Robotic Surgery on kidney December 2020 6. Major health problems in the past 3 months?no 7. Any artificial heart valves, MVP, or defibrillator?no    MEDICATIONS & ALLERGIES:    Patient reports the following regarding taking any anticoagulation/antiplatelet therapy:   Plavix, Coumadin, Eliquis, Xarelto, Lovenox, Pradaxa, Brilinta, or Effient? no Aspirin? no  Patient confirms/reports the following medications:  Current Outpatient Medications  Medication Sig Dispense Refill  . acetaminophen (TYLENOL) 500 MG tablet Take 500 mg by mouth every 6 (six) hours as needed (pain).    Marland Kitchen amLODipine (NORVASC) 10 MG tablet Take 10 mg by mouth daily.    . carvedilol (COREG) 25 MG tablet Take 25 mg by mouth 2 (two) times daily.    . fenofibrate (TRICOR) 145 MG tablet Take 145 mg by mouth daily.    . furosemide (LASIX) 20 MG tablet Take 10 mg by mouth daily.    Marland Kitchen lamoTRIgine (LAMICTAL) 100 MG tablet Take 100 mg by mouth 2 (two) times daily.    Marland Kitchen NOVOLOG MIX 70/30 FLEXPEN (70-30) 100 UNIT/ML FlexPen Inject 30 Units as directed daily.    Marland Kitchen omeprazole (PRILOSEC) 20 MG capsule Take 20 mg by mouth daily.    . tamsulosin (FLOMAX) 0.4 MG CAPS capsule Take 0.4 mg by mouth daily.     . traMADol (ULTRAM) 50 MG tablet Take 1-2 tablets (50-100 mg total) by mouth every 6 (six) hours as needed for moderate pain or severe pain. 20 tablet 0  . ULTICARE PEN NEEDLES 29G X 12.7MM MISC     . Valbenazine Tosylate (INGREZZA) 40 MG CAPS Take 40 mg by mouth daily.     .  valsartan-hydrochlorothiazide (DIOVAN-HCT) 320-25 MG per tablet Take 1 tablet by mouth daily.    . ziprasidone (GEODON) 80 MG capsule Take 80 mg by mouth 2 (two) times daily.     No current facility-administered medications for this visit.    Patient confirms/reports the following allergies:  No Known Allergies  No orders of the defined types were placed in this encounter.   AUTHORIZATION INFORMATION Primary Insurance: 1D#: Group #:  Secondary Insurance: 1D#: Group #:  SCHEDULE INFORMATION: Date: 03/11/20 Time: Location:[facility]

## 2020-03-09 ENCOUNTER — Other Ambulatory Visit: Payer: Self-pay

## 2020-03-09 ENCOUNTER — Other Ambulatory Visit
Admission: RE | Admit: 2020-03-09 | Discharge: 2020-03-09 | Disposition: A | Discharge status: Home / Self Care | Payer: Medicare Other | Source: Ambulatory Visit | Attending: Gastroenterology | Admitting: Gastroenterology

## 2020-03-09 DIAGNOSIS — Z01812 Encounter for preprocedural laboratory examination: Secondary | ICD-10-CM | POA: Insufficient documentation

## 2020-03-09 DIAGNOSIS — Z20822 Contact with and (suspected) exposure to covid-19: Secondary | ICD-10-CM | POA: Diagnosis not present

## 2020-03-09 LAB — SARS CORONAVIRUS 2 (TAT 6-24 HRS): SARS Coronavirus 2: NEGATIVE

## 2020-03-11 MED ORDER — NA SULFATE-K SULFATE-MG SULF 17.5-3.13-1.6 GM/177ML PO SOLN: ORAL | 0 refills | Status: DC

## 2020-03-16 DIAGNOSIS — F251 Schizoaffective disorder, depressive type: Secondary | ICD-10-CM | POA: Diagnosis not present

## 2020-03-16 DIAGNOSIS — Z79899 Other long term (current) drug therapy: Secondary | ICD-10-CM | POA: Diagnosis not present

## 2020-04-06 DIAGNOSIS — Z20828 Contact with and (suspected) exposure to other viral communicable diseases: Secondary | ICD-10-CM | POA: Diagnosis not present

## 2020-04-06 DIAGNOSIS — E119 Type 2 diabetes mellitus without complications: Secondary | ICD-10-CM | POA: Diagnosis not present

## 2020-04-06 DIAGNOSIS — R05 Cough: Secondary | ICD-10-CM | POA: Diagnosis not present

## 2020-04-13 ENCOUNTER — Other Ambulatory Visit: Payer: Self-pay

## 2020-04-13 ENCOUNTER — Other Ambulatory Visit
Admission: RE | Admit: 2020-04-13 | Discharge: 2020-04-13 | Disposition: A | Discharge status: Home / Self Care | Payer: Medicare Other | Source: Ambulatory Visit | Attending: Gastroenterology | Admitting: Gastroenterology

## 2020-04-13 DIAGNOSIS — Z20822 Contact with and (suspected) exposure to covid-19: Secondary | ICD-10-CM | POA: Insufficient documentation

## 2020-04-13 DIAGNOSIS — Z01812 Encounter for preprocedural laboratory examination: Secondary | ICD-10-CM | POA: Diagnosis not present

## 2020-04-13 LAB — SARS CORONAVIRUS 2 (TAT 6-24 HRS): SARS Coronavirus 2: NEGATIVE

## 2020-04-15 ENCOUNTER — Ambulatory Visit
Admission: RE | Admit: 2020-04-15 | Discharge: 2020-04-15 | Disposition: A | Discharge status: Home / Self Care | Payer: Medicare Other | Attending: Gastroenterology | Admitting: Gastroenterology

## 2020-04-15 ENCOUNTER — Encounter: Payer: Self-pay | Admitting: Gastroenterology

## 2020-04-15 ENCOUNTER — Ambulatory Visit: Payer: Medicare Other | Admitting: Certified Registered Nurse Anesthetist

## 2020-04-15 ENCOUNTER — Other Ambulatory Visit: Payer: Self-pay

## 2020-04-15 ENCOUNTER — Encounter
Admission: RE | Disposition: A | Discharge status: Home / Self Care | Payer: Self-pay | Source: Home / Self Care | Attending: Gastroenterology

## 2020-04-15 DIAGNOSIS — Z1211 Encounter for screening for malignant neoplasm of colon: Secondary | ICD-10-CM | POA: Diagnosis not present

## 2020-04-15 DIAGNOSIS — F259 Schizoaffective disorder, unspecified: Secondary | ICD-10-CM | POA: Diagnosis not present

## 2020-04-15 DIAGNOSIS — K5790 Diverticulosis of intestine, part unspecified, without perforation or abscess without bleeding: Secondary | ICD-10-CM | POA: Diagnosis not present

## 2020-04-15 DIAGNOSIS — Z7189 Other specified counseling: Secondary | ICD-10-CM

## 2020-04-15 DIAGNOSIS — K5730 Diverticulosis of large intestine without perforation or abscess without bleeding: Secondary | ICD-10-CM | POA: Insufficient documentation

## 2020-04-15 DIAGNOSIS — Z905 Acquired absence of kidney: Secondary | ICD-10-CM | POA: Diagnosis not present

## 2020-04-15 DIAGNOSIS — D175 Benign lipomatous neoplasm of intra-abdominal organs: Secondary | ICD-10-CM | POA: Insufficient documentation

## 2020-04-15 DIAGNOSIS — K635 Polyp of colon: Secondary | ICD-10-CM | POA: Diagnosis not present

## 2020-04-15 DIAGNOSIS — D120 Benign neoplasm of cecum: Secondary | ICD-10-CM | POA: Diagnosis not present

## 2020-04-15 DIAGNOSIS — Z79899 Other long term (current) drug therapy: Secondary | ICD-10-CM | POA: Insufficient documentation

## 2020-04-15 DIAGNOSIS — D124 Benign neoplasm of descending colon: Secondary | ICD-10-CM | POA: Diagnosis not present

## 2020-04-15 DIAGNOSIS — F17200 Nicotine dependence, unspecified, uncomplicated: Secondary | ICD-10-CM | POA: Insufficient documentation

## 2020-04-15 DIAGNOSIS — E1122 Type 2 diabetes mellitus with diabetic chronic kidney disease: Secondary | ICD-10-CM | POA: Insufficient documentation

## 2020-04-15 DIAGNOSIS — D122 Benign neoplasm of ascending colon: Secondary | ICD-10-CM | POA: Diagnosis not present

## 2020-04-15 DIAGNOSIS — Z794 Long term (current) use of insulin: Secondary | ICD-10-CM | POA: Insufficient documentation

## 2020-04-15 DIAGNOSIS — N189 Chronic kidney disease, unspecified: Secondary | ICD-10-CM | POA: Insufficient documentation

## 2020-04-15 DIAGNOSIS — D123 Benign neoplasm of transverse colon: Secondary | ICD-10-CM | POA: Diagnosis not present

## 2020-04-15 DIAGNOSIS — I129 Hypertensive chronic kidney disease with stage 1 through stage 4 chronic kidney disease, or unspecified chronic kidney disease: Secondary | ICD-10-CM | POA: Insufficient documentation

## 2020-04-15 HISTORY — PX: COLONOSCOPY WITH PROPOFOL: SHX5780

## 2020-04-15 LAB — GLUCOSE, CAPILLARY: Glucose-Capillary: 142 mg/dL — ABNORMAL HIGH (ref 70–99)

## 2020-04-15 SURGERY — COLONOSCOPY WITH PROPOFOL
Anesthesia: General

## 2020-04-15 MED ORDER — LIDOCAINE HCL (CARDIAC) PF 100 MG/5ML IV SOSY
PREFILLED_SYRINGE | INTRAVENOUS | Status: DC | PRN
  Administered 2020-04-15: 50 mg via INTRAVENOUS

## 2020-04-15 MED ORDER — PROPOFOL 500 MG/50ML IV EMUL
INTRAVENOUS | Status: AC
  Filled 2020-04-15: qty 50

## 2020-04-15 MED ORDER — PROPOFOL 500 MG/50ML IV EMUL
INTRAVENOUS | Status: DC | PRN
  Administered 2020-04-15: 175 ug/kg/min via INTRAVENOUS

## 2020-04-15 MED ORDER — PROPOFOL 10 MG/ML IV BOLUS
INTRAVENOUS | Status: DC | PRN
  Administered 2020-04-15: 50 mg via INTRAVENOUS

## 2020-04-15 MED ORDER — LIDOCAINE HCL (PF) 2 % IJ SOLN
INTRAMUSCULAR | Status: AC
  Filled 2020-04-15: qty 5

## 2020-04-15 MED ORDER — GLYCOPYRROLATE 0.2 MG/ML IJ SOLN
INTRAMUSCULAR | Status: DC | PRN
  Administered 2020-04-15: .2 mg via INTRAVENOUS

## 2020-04-15 MED ORDER — SODIUM CHLORIDE 0.9 % IV SOLN: INTRAVENOUS | Status: DC

## 2020-04-15 NOTE — Op Note (Signed)
[facility] Patient Name: Riley Stuart Procedure Date: 04/15/2020 7:46 AM MRN: [id number] Account #: [id number] Date of Birth: 1951-01-08 Admit Type: Outpatient Age: 69 Room: [facility] ENDO ROOM 2 Gender: Male Note Status: Finalized Procedure:             Colonoscopy Indications:           Screening for colorectal malignant neoplasm Providers:             Deasha Clendenin B. Bonna Gains MD, MD Medicines:             Monitored Anesthesia Care Complications:         No immediate complications. Procedure:             Pre-Anesthesia Assessment:                        - ASA Grade Assessment: II - A patient with mild                         systemic disease.                        - Prior to the procedure, a History and Physical was                         performed, and patient medications, allergies and                         sensitivities were reviewed. The patient's tolerance                         of previous anesthesia was reviewed.                        - The risks and benefits of the procedure and the                         sedation options and risks were discussed with the                         patient. All questions were answered and informed                         consent was obtained.                        - Patient identification and proposed procedure were                         verified prior to the procedure by the physician, the                         nurse, the anesthesiologist, the anesthetist and the                         technician. The procedure was verified in the                         procedure room.  After obtaining informed consent, the colonoscope was                         passed under direct vision. Throughout the procedure,                         the patient's blood pressure, pulse, and oxygen                         saturations were monitored continuously. The                          Colonoscope was introduced through the anus and                         advanced to the the cecum, identified by appendiceal                         orifice and ileocecal valve. The colonoscopy was                         performed with ease. The patient tolerated the                         procedure well. The quality of the bowel preparation                         was good. Findings:      The perianal and digital rectal examinations were normal.      Two flat polyps were found in the cecum. The polyps were 3 to 4 mm in       size. These polyps were removed with a jumbo cold forceps. Resection and       retrieval were complete.      Four sessile polyps were found in the ascending colon. The polyps were 5       to 7 mm in size. These polyps were removed with a cold snare. Resection       and retrieval were complete.      There was a medium-sized lipoma, in the ascending colon. Biopsies were       taken with a cold forceps for histology. Bright yellow material seen       after biopsy, and pillow sign suggestive of lipoma.      Two sessile polyps were found in the descending colon and transverse       colon. The polyps were 10 to 15 mm in size. Area was successfully       injected with 5 mL Eleview for lesion assessment, and this injection       appeared to lift the lesion adequately. These polyps were removed with a       hot snare. Resection and retrieval were complete. The transverse colon       polyp was lifted the descending colon polyp was not. The transverse       colon polyp was retrieved via roth net. Due to the presence of multiple       other polyps in the descending colon that needed to be removed, the roth       net had to be opened to remove the polyp. The descending colon polyp  that was removed via hot snare had to be retrieved in the same roth net       as the transverse colon polyp and placed in the same jar.      A 5 mm polyp was found in the descending colon. The  polyp was sessile.       The polyp was removed with a cold snare. Resection and retrieval were       complete.      A 4 mm polyp was found in the descending colon. The polyp was sessile.       The polyp was removed with a jumbo cold forceps. Resection and retrieval       were complete. The descending and transverse colon polyps all had to be       placed in the same jar as roth net was needed to retrieve the big polyps       and the small polyps had to be suctioned into the trap prior to       withdrawal with the roth net and larger polyps.      Multiple diverticula were found in the sigmoid colon.      The exam was otherwise without abnormality.      The rectum, sigmoid colon, descending colon, transverse colon, ascending       colon and cecum appeared normal.      The retroflexed view of the distal rectum and anal verge was normal and       showed no anal or rectal abnormalities. Impression:            - Two 3 to 4 mm polyps in the cecum, removed with a                         jumbo cold forceps. Resected and retrieved.                        - Four 5 to 7 mm polyps in the ascending colon,                         removed with a cold snare. Resected and retrieved.                        - Medium-sized lipoma in the ascending colon. Biopsied.                        - Two 10 to 15 mm polyps in the descending colon and                         in the transverse colon, removed with a hot snare.                         Resected and retrieved. Injected.                        - One 5 mm polyp in the descending colon, removed with                         a cold snare. Resected and retrieved.                        -  One 4 mm polyp in the descending colon, removed with                         a jumbo cold forceps. Resected and retrieved.                        - Diverticulosis in the sigmoid colon.                        - The examination was otherwise normal.                        - The  rectum, sigmoid colon, descending colon,                         transverse colon, ascending colon and cecum are normal.                        - The distal rectum and anal verge are normal on                         retroflexion view. Recommendation:        - Discharge patient to home (with escort).                        - High fiber diet.                        - Advance diet as tolerated.                        - Continue present medications.                        - Await pathology results.                        - Repeat colonoscopy in 1 year.                        - The findings and recommendations were discussed with                         the patient.                        - The findings and recommendations were discussed with                         the patient's family.                        - Return to primary care physician as previously                         scheduled. Procedure Code(s):     --- Professional ---                        [phone], Colonoscopy, flexible; with removal of  tumor(s), polyp(s), or other lesion(s) by snare                         technique                        45380, 59, Colonoscopy, flexible; with biopsy, single                         or multiple                        45381, Colonoscopy, flexible; with directed submucosal                         injection(s), any substance Diagnosis Code(s):     --- Professional ---                        K63.5, Polyp of colon                        Z12.11, Encounter for screening for malignant neoplasm                         of colon                        D17.5, Benign lipomatous neoplasm of intra-abdominal                         organs CPT copyright 2019 American Medical Association. All rights reserved. The codes documented in this report are preliminary and upon coder review may  be revised to meet current compliance requirements.  Vonda Antigua, MD Margretta Sidle B. Bonna Gains  MD, MD 04/15/2020 9:48:04 AM This report has been signed electronically. Number of Addenda: 0 Note Initiated On: 04/15/2020 7:46 AM Scope Withdrawal Time: 0 hours 39 minutes 11 seconds  Total Procedure Duration: 0 hours 41 minutes 0 seconds  Estimated Blood Loss:  Estimated blood loss: none.      [name]

## 2020-04-15 NOTE — Anesthesia Preprocedure Evaluation (Signed)
Anesthesia Evaluation  Patient identified by MRN, date of birth, ID band Patient awake    Reviewed: Allergy & Precautions, H&P , NPO status , Patient's Chart, lab work & pertinent test results, reviewed documented beta blocker date and time   History of Anesthesia Complications Negative for: history of anesthetic complications  Airway Mallampati: II  TM Distance: >3 FB Neck ROM: full    Dental  (+) Edentulous Upper, Edentulous Lower, Dental Advidsory Given   Pulmonary neg shortness of breath, neg COPD, neg recent URI, Current Smoker,    Pulmonary exam normal breath sounds clear to auscultation       Cardiovascular Exercise Tolerance: Good hypertension, (-) angina(-) Past MI and (-) Cardiac Stents Normal cardiovascular exam(-) dysrhythmias (-) Valvular Problems/Murmurs Rhythm:regular Rate:Normal     Neuro/Psych PSYCHIATRIC DISORDERS (Schizoaffective disorder) negative neurological ROS     GI/Hepatic negative GI ROS, Neg liver ROS,   Endo/Other  diabetes  Renal/GU CRFRenal disease  negative genitourinary   Musculoskeletal   Abdominal   Peds  Hematology negative hematology ROS (+)   Anesthesia Other Findings Past Medical History: No date: Chronic kidney disease     Comment:  renal insufficiency No date: Diabetes mellitus without complication ([name])     Comment:  Pt takes Insulin No date: Hypertension No date: Mental disorder     Comment:  schizoaffective   Reproductive/Obstetrics negative OB ROS                             Anesthesia Physical Anesthesia Plan  ASA: II  Anesthesia Plan: General   Post-op Pain Management:    Induction: Intravenous  PONV Risk Score and Plan: 1 and Propofol infusion and TIVA  Airway Management Planned: Natural Airway and Nasal Cannula  Additional Equipment:   Intra-op Plan:   Post-operative Plan:   Informed Consent: I have reviewed the  patients History and Physical, chart, labs and discussed the procedure including the risks, benefits and alternatives for the proposed anesthesia with the patient or authorized representative who has indicated his/her understanding and acceptance.     Dental Advisory Given  Plan Discussed with: Anesthesiologist, CRNA and Surgeon  Anesthesia Plan Comments:         Anesthesia Quick Evaluation

## 2020-04-15 NOTE — H&P (Signed)
Riley Antigua, MD [address] [place], [place], [place], [address] [address], [place], [place], [place], [address] Phone: [phone]  Fax: [phone]  Primary Care Physician:  Novella Rob, FNP   Pre-Procedure History & Physical: HPI:  Riley Stuart is a 69 y.o. male is here for a colonoscopy.   Past Medical History:  Diagnosis Date  . Chronic kidney disease    renal insufficiency  . Diabetes mellitus without complication ([name])    Pt takes Insulin  . Hypertension   . Mental disorder    schizoaffective    Past Surgical History:  Procedure Laterality Date  . ANKLE FRACTURE SURGERY    . CRANIOTOMY Left 10/27/2013   Procedure: Craniotomy for Aneurysm Clipping;  Surgeon: Consuella Lose, MD;  Location: [facility];  Service: Neurosurgery;  Laterality: Left;  . KNEE SURGERY     due to fracture.   . ROBOTIC ASSITED PARTIAL NEPHRECTOMY Left 08/10/2019   Procedure: XI ROBOTIC ASSITED LAPAROSCOPIC  PARTIAL NEPHRECTOMY;  Surgeon: Raynelle Bring, MD;  Location: [facility];  Service: Urology;  Laterality: Left;    Prior to Admission medications   Medication Sig Start Date End Date Taking? Authorizing Provider  acetaminophen (TYLENOL) 500 MG tablet Take 500 mg by mouth every 6 (six) hours as needed (pain).    [provider]  amLODipine (NORVASC) 10 MG tablet Take 10 mg by mouth daily. 07/27/19   [provider]  carvedilol (COREG) 25 MG tablet Take 25 mg by mouth 2 (two) times daily. 07/16/19   [provider]  fenofibrate (TRICOR) 145 MG tablet Take 145 mg by mouth daily.    [provider]  furosemide (LASIX) 20 MG tablet Take 10 mg by mouth daily. 07/21/19   [provider]  lamoTRIgine (LAMICTAL) 100 MG tablet Take 100 mg by mouth 2 (two) times daily.    [provider]  Na Sulfate-K Sulfate-Mg Sulf 17.5-3.13-1.6 GM/177ML SOLN At 5 PM the day before procedure take 1 bottle and 5 hours before procedure  take 1 bottle. 03/11/20   Modesta Sammons, Lennette Bihari, MD  NOVOLOG MIX 70/30 FLEXPEN (70-30) 100 UNIT/ML FlexPen Inject 30 Units as directed daily. 06/24/19   [provider]  omeprazole (PRILOSEC) 20 MG capsule Take 20 mg by mouth daily.    [provider]  tamsulosin (FLOMAX) 0.4 MG CAPS capsule Take 0.4 mg by mouth daily.  02/06/19   [provider]  traMADol (ULTRAM) 50 MG tablet Take 1-2 tablets (50-100 mg total) by mouth every 6 (six) hours as needed for moderate pain or severe pain. 08/10/19   Debbrah Alar, PA-C  ULTICARE PEN NEEDLES 29G X 12.7MM MISC  11/02/19   [provider]  Valbenazine Tosylate (INGREZZA) 40 MG CAPS Take 40 mg by mouth daily.     [provider]  valsartan-hydrochlorothiazide (DIOVAN-HCT) 320-25 MG per tablet Take 1 tablet by mouth daily. Patient not taking: Reported on 02/25/2020    [provider]  ziprasidone (GEODON) 80 MG capsule Take 80 mg by mouth 2 (two) times daily. 07/27/19   [provider]    Allergies as of 02/25/2020  . (No Known Allergies)    History reviewed. No pertinent family history.  Social History   Socioeconomic History  . Marital status: Unknown    Spouse name: Not on file  . Number of children: Not on file  . Years of education: Not on file  . Highest education level: Not on file  Occupational History  .  Not on file  Tobacco Use  . Smoking status: Current Every Day Smoker    Packs/day: 1.00  . Smokeless tobacco: Never Used  Substance and Sexual Activity  . Alcohol use: Not Currently  . Drug use: Never  . Sexual activity: Not on file  Other Topics Concern  . Not on file  Social History Narrative  . Not on file   Social Determinants of Health   Financial Resource Strain:   . Difficulty of Paying Living Expenses: Not on file  Food Insecurity:   . Worried About Charity fundraiser in the Last Year: Not on file  . Ran Out of Food in the Last Year: Not on file    Transportation Needs:   . Lack of Transportation (Medical): Not on file  . Lack of Transportation (Non-Medical): Not on file  Physical Activity:   . Days of Exercise per Week: Not on file  . Minutes of Exercise per Session: Not on file  Stress:   . Feeling of Stress : Not on file  Social Connections:   . Frequency of Communication with Friends and Family: Not on file  . Frequency of Social Gatherings with Friends and Family: Not on file  . Attends Religious Services: Not on file  . Active Member of Clubs or Organizations: Not on file  . Attends Archivist Meetings: Not on file  . Marital Status: Not on file  Intimate Partner Violence:   . Fear of Current or Ex-Partner: Not on file  . Emotionally Abused: Not on file  . Physically Abused: Not on file  . Sexually Abused: Not on file    Review of Systems: See HPI, otherwise negative ROS  Physical Exam: BP (!) 149/108   Pulse 72   Temp (!) 97.1 F (36.2 C) (Temporal)   Resp 18   Ht 6' (1.829 m)   Wt 106.1 kg   SpO2 99%   BMI 31.74 kg/m  General:   Alert,  pleasant and cooperative in NAD Head:  Normocephalic and atraumatic. Neck:  Supple; no masses or thyromegaly. Lungs:  Clear throughout to auscultation, normal respiratory effort.    Heart:  +S1, +S2, Regular rate and rhythm, No edema. Abdomen:  Soft, nontender and nondistended. Normal bowel sounds, without guarding, and without rebound.   Neurologic:  Alert and  oriented x4;  grossly normal neurologically.  Impression/Plan: Riley Stuart is here for a colonoscopy to be performed for average risk screening.  Risks, benefits, limitations, and alternatives regarding  colonoscopy have been reviewed with the patient.  Questions have been answered.  All parties agreeable.   Virgel Manifold, MD  04/15/2020, 8:39 AM

## 2020-04-15 NOTE — Transfer of Care (Signed)
Immediate Anesthesia Transfer of Care Note  Patient: Riley Stuart  Procedure(s) Performed: COLONOSCOPY WITH PROPOFOL (N/A )  Patient Location: PACU  Anesthesia Type:General  Level of Consciousness: sedated  Airway & Oxygen Therapy: Patient Spontanous Breathing and Patient connected to nasal cannula oxygen  Post-op Assessment: Report given to RN and Post -op Vital signs reviewed and stable  Post vital signs: Reviewed  Last Vitals:  Vitals Value Taken Time  BP 126/79 04/15/20 0934  Temp    Pulse 41 04/15/20 0934  Resp 12 04/15/20 0934  SpO2 97 % 04/15/20 0934  Vitals shown include unvalidated device data.  Last Pain:  Vitals:   04/15/20 0750  TempSrc: Temporal  PainSc: 0-No pain         Complications: No complications documented.

## 2020-04-15 NOTE — Anesthesia Procedure Notes (Signed)
Date/Time: 04/15/2020 8:40 AM Performed by: Johnna Acosta, CRNA Pre-anesthesia Checklist: Patient identified, Emergency Drugs available, Suction available, Patient being monitored and Timeout performed Patient Re-evaluated:Patient Re-evaluated prior to induction Oxygen Delivery Method: Nasal cannula Preoxygenation: Pre-oxygenation with 100% oxygen Induction Type: IV induction

## 2020-04-15 NOTE — Anesthesia Postprocedure Evaluation (Signed)
Anesthesia Post Note  Patient: Cleatis Fandrich Mcree  Procedure(s) Performed: COLONOSCOPY WITH PROPOFOL (N/A )  Patient location during evaluation: Endoscopy Anesthesia Type: General Level of consciousness: awake and alert Pain management: pain level controlled Vital Signs Assessment: post-procedure vital signs reviewed and stable Respiratory status: spontaneous breathing, nonlabored ventilation, respiratory function stable and patient connected to nasal cannula oxygen Cardiovascular status: blood pressure returned to baseline and stable Postop Assessment: no apparent nausea or vomiting Anesthetic complications: no   No complications documented.   Last Vitals:  Vitals:   04/15/20 1011 04/15/20 1014  BP: (!) 186/104 (!) 186/104  Pulse:  83  Resp:  16  Temp:    SpO2:  97%    Last Pain:  Vitals:   04/15/20 1014  TempSrc:   PainSc: 0-No pain                 Martha Clan

## 2020-04-18 ENCOUNTER — Encounter: Payer: Self-pay | Admitting: Gastroenterology

## 2020-04-18 LAB — SURGICAL PATHOLOGY

## 2020-04-27 DIAGNOSIS — J029 Acute pharyngitis, unspecified: Secondary | ICD-10-CM | POA: Diagnosis not present

## 2020-04-27 DIAGNOSIS — Z20828 Contact with and (suspected) exposure to other viral communicable diseases: Secondary | ICD-10-CM | POA: Diagnosis not present

## 2020-04-27 DIAGNOSIS — R05 Cough: Secondary | ICD-10-CM | POA: Diagnosis not present

## 2020-05-18 DIAGNOSIS — E1122 Type 2 diabetes mellitus with diabetic chronic kidney disease: Secondary | ICD-10-CM | POA: Diagnosis not present

## 2020-05-18 DIAGNOSIS — G2401 Drug induced subacute dyskinesia: Secondary | ICD-10-CM | POA: Diagnosis not present

## 2020-05-18 DIAGNOSIS — Z20828 Contact with and (suspected) exposure to other viral communicable diseases: Secondary | ICD-10-CM | POA: Diagnosis not present

## 2020-05-18 DIAGNOSIS — I10 Essential (primary) hypertension: Secondary | ICD-10-CM | POA: Diagnosis not present

## 2020-06-14 DIAGNOSIS — I129 Hypertensive chronic kidney disease with stage 1 through stage 4 chronic kidney disease, or unspecified chronic kidney disease: Secondary | ICD-10-CM | POA: Diagnosis not present

## 2020-06-14 DIAGNOSIS — E1122 Type 2 diabetes mellitus with diabetic chronic kidney disease: Secondary | ICD-10-CM | POA: Diagnosis not present

## 2020-06-14 DIAGNOSIS — N184 Chronic kidney disease, stage 4 (severe): Secondary | ICD-10-CM | POA: Diagnosis not present

## 2020-06-14 DIAGNOSIS — R808 Other proteinuria: Secondary | ICD-10-CM | POA: Diagnosis not present

## 2020-06-14 DIAGNOSIS — N2889 Other specified disorders of kidney and ureter: Secondary | ICD-10-CM | POA: Diagnosis not present

## 2020-06-15 DIAGNOSIS — F251 Schizoaffective disorder, depressive type: Secondary | ICD-10-CM | POA: Diagnosis not present

## 2020-06-17 DIAGNOSIS — E113212 Type 2 diabetes mellitus with mild nonproliferative diabetic retinopathy with macular edema, left eye: Secondary | ICD-10-CM | POA: Diagnosis not present

## 2020-06-24 DIAGNOSIS — Z20828 Contact with and (suspected) exposure to other viral communicable diseases: Secondary | ICD-10-CM | POA: Diagnosis not present

## 2020-07-11 DIAGNOSIS — I129 Hypertensive chronic kidney disease with stage 1 through stage 4 chronic kidney disease, or unspecified chronic kidney disease: Secondary | ICD-10-CM | POA: Diagnosis not present

## 2020-07-11 DIAGNOSIS — F17210 Nicotine dependence, cigarettes, uncomplicated: Secondary | ICD-10-CM | POA: Diagnosis not present

## 2020-07-28 DIAGNOSIS — I10 Essential (primary) hypertension: Secondary | ICD-10-CM | POA: Diagnosis not present

## 2020-07-28 DIAGNOSIS — E7849 Other hyperlipidemia: Secondary | ICD-10-CM | POA: Diagnosis not present

## 2020-07-28 DIAGNOSIS — E1122 Type 2 diabetes mellitus with diabetic chronic kidney disease: Secondary | ICD-10-CM | POA: Diagnosis not present

## 2020-09-28 ENCOUNTER — Other Ambulatory Visit: Payer: Self-pay | Admitting: Urology

## 2020-09-28 ENCOUNTER — Other Ambulatory Visit (HOSPITAL_COMMUNITY): Payer: Self-pay | Admitting: Urology

## 2020-09-28 DIAGNOSIS — C642 Malignant neoplasm of left kidney, except renal pelvis: Secondary | ICD-10-CM

## 2020-10-10 ENCOUNTER — Ambulatory Visit
Admission: RE | Admit: 2020-10-10 | Discharge: 2020-10-10 | Disposition: A | Discharge status: Home / Self Care | Payer: Medicare Other | Source: Ambulatory Visit | Attending: Urology | Admitting: Urology

## 2020-10-10 ENCOUNTER — Other Ambulatory Visit: Payer: Self-pay

## 2020-10-10 DIAGNOSIS — C642 Malignant neoplasm of left kidney, except renal pelvis: Secondary | ICD-10-CM | POA: Diagnosis present

## 2020-10-10 MED ORDER — GADOBUTROL 1 MMOL/ML IV SOLN
10.0000 mL | Freq: Once | INTRAVENOUS | Status: AC | PRN
  Administered 2020-10-10: 10 mL via INTRAVENOUS

## 2020-11-08 ENCOUNTER — Other Ambulatory Visit: Payer: Self-pay | Admitting: Urology

## 2020-11-08 DIAGNOSIS — C642 Malignant neoplasm of left kidney, except renal pelvis: Secondary | ICD-10-CM

## 2020-11-17 ENCOUNTER — Encounter: Payer: Self-pay | Admitting: Radiation Oncology

## 2020-11-17 NOTE — Progress Notes (Signed)
  Radiation Oncology         (336) [phone] ________________________________  Name: Riley Stuart MRN: [id number]  Date: 11/18/2020  DOB: [birth year]  STEREOTACTIC BODY RADIOTHERAPY SIMULATION AND TREATMENT PLANNING NOTE    ICD-10-CM   1. Cancer of kidney, left ([name])  C64.2     DIAGNOSIS:  70 yo man with an isolated oligometastatic 2.2 cm left para aortic lymph node from renal cell cancer  NARRATIVE:  The patient was brought to the [place].  Identity was confirmed.  All relevant records and images related to the planned course of therapy were reviewed.  The patient freely provided informed written consent to proceed with treatment after reviewing the details related to the planned course of therapy. The consent form was witnessed and verified by the simulation staff.  Then, the patient was set-up in a stable reproducible  supine position for radiation therapy.  A BodyFix immobilization pillow was fabricated for reproducible positioning.  Surface markings were placed.  The CT images were loaded into the planning software.  The gross target volumes (GTV) and planning target volumes (PTV) were delinieated, and avoidance structures were contoured.  Treatment planning then occurred.  The radiation prescription was entered and confirmed.  A total of two complex treatment devices were fabricated in the form of the BodyFix immobilization pillow and a neck accuform cushion.  I have requested : 3D Simulation  I have requested a DVH of the following structures: targets and all normal structures near the target including kidneys, bowel, stomach and spinal cord as noted on the radiation plan to maintain doses in adherence with established limits  SPECIAL TREATMENT PROCEDURE:  The planned course of therapy using radiation constitutes a special treatment procedure. Special care is required in the management of this patient for the following reasons. High dose per fraction requiring special  monitoring for increased toxicities of treatment including daily imaging..  The special nature of the planned course of radiotherapy will require increased physician supervision and oversight to ensure patient's safety with optimal treatment outcomes.  PLAN:  The patient will receive 50 Gy in 5 fractions.  ________________________________  Sheral Apley Tammi Klippel, M.D.

## 2020-11-17 NOTE — Progress Notes (Signed)
GU Location of Tumor / Histology:     Riley Stuart presented for survelliance MRI which revealed 2.4 x 1.8 cm enhancing nodularity over the lower pole partial nephrectomy site and new enlarging 2.2 cm left para aortic lymph node.  FINAL MICROSCOPIC DIAGNOSIS:   A. 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 LEFT PERINEPHRIC FAT, EXCISION:  Benign fibroadipose tissue    KIDNEY:  Procedure: Partial Nephrectomy  Specimen Laterality: Left  Tumor Size: 9.0 cm  Tumor Focality: Focal  Histologic Type: Papillary renal cell carcinoma, type 2  Sarcomatoid Features: Negative  Rhabdoid Features: Negative  Histologic Grade: 3  Tumor Necrosis: Negative  Tumor Extension: Perirenal fat  Margins: Negative  Regional Lymph Nodes:    No lymph nodes submitted or found: X    Number of Lymph Nodes Involved: NA    Number of Lymph Nodes Examined: NA  Pathologic Stage Classification (pTNM, AJCC 8th Edition): pT3a, pNx  Pathologic Findings in Nonneoplastic Kidney: Obstructive changes  Representative tumor block: A8  Comment(s): The neoplasm stains positive for pax 8, negative for GATA3,  high molecular weight cytokeratin, p63 and ck7, supporting these cells  are renal primary.   Past/Anticipated interventions by urology, if any: partial nephrectomy, surveillance, referral to Dr. Tammi Klippel for consideration of radiation therapy  Past/Anticipated interventions by medical oncology, if any: no  Weight changes, if any: denies  Bowel/Bladder complaints, if any: Denies dysuria. Denies visible hematuria. Reports occasional leakage in the morning right after waking up. Denies any bowel complaints.  Nausea/Vomiting, if any: denies  Pain issues, if any:  Pain left side of naval that began after visiting [place]:  Prior radiation? denies  Pacemaker/ICD? denies  Possible current pregnancy? no, male patient  Is the patient on methotrexate? denies  Current Complaints / other details:  70 year old male. Single. No children. 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 Reports fatigue and lack of energy.  Scheduled for a consultation "at [facility]" on April 4th.

## 2020-11-18 ENCOUNTER — Other Ambulatory Visit: Payer: Self-pay

## 2020-11-18 ENCOUNTER — Ambulatory Visit
Admission: RE | Admit: 2020-11-18 | Discharge: 2020-11-18 | Disposition: A | Discharge status: Home / Self Care | Payer: Medicare Other | Source: Ambulatory Visit | Attending: Radiation Oncology | Admitting: Radiation Oncology

## 2020-11-18 ENCOUNTER — Encounter: Payer: Self-pay | Admitting: Radiation Oncology

## 2020-11-18 DIAGNOSIS — C642 Malignant neoplasm of left kidney, except renal pelvis: Secondary | ICD-10-CM

## 2020-11-18 DIAGNOSIS — D49512 Neoplasm of unspecified behavior of left kidney: Secondary | ICD-10-CM

## 2020-11-18 HISTORY — DX: Malignant neoplasm of unspecified kidney, except renal pelvis: C64.9

## 2020-11-18 NOTE — Progress Notes (Signed)
Radiation Oncology         (336) [phone] ________________________________  Initial outpatient Consultation - Conducted via telephone due to current COVID-19 concerns for limiting patient exposure  Name: Riley Stuart MRN: [id number]  Date of Service: 11/18/2020 DOB: 09/09/1950  [id number], Riley Loosen, MD  Raynelle Bring, MD   REFERRING PHYSICIAN: Raynelle Bring, MD  DIAGNOSIS: 70 y.o. man with a local recurrence of renal cell carcinoma in the left kidney with regional oligometastatic nodal disease in a left para-aortic node.    ICD-10-CM   1. Cancer of kidney, left ([name])  C64.2     HISTORY OF PRESENT ILLNESS: Riley Stuart is a 70 y.o. male seen at the request of Dr. Alinda Money. He has a history of chronic kidney disease, initially diagnosed with type II papillary renal cell carcinoma on 01/22/19. He was referred to Dr. Alinda Money and ultimately underwent left partial nephrectomy on 08/10/19. Pathology confirmed pT3aNxMx, papillary renal cell carcinoma, type II, grade 3 (9 cm) with tumor invasion into the perirenal fat but negative surgical margins. He has since been under observation followed closely with surveillance MRI abdominal approximately every 6 months.  He recently underwent restaging MRI abdomen on 10/10/20 which showed a new, 2.4 cm faintly-enhancing nodularity along the surgical site of the lower pole left kidney, concerning for local disease recurrence.  Additionally, there was a new pathologically enlarged left periaortic lymph node measuring approximately 2.2, concerning for regional lymph node disease.  His case was presented at our recent multidisciplinary urology conference and consensus recommendation was to proceed with percutaneous ablation of the renal nodule and stereotactic radiotherapy (SBRT) to the periaortic lymph node if the patient was unwilling to consider further surgery for resection.  He met back with Dr. Alinda Money on 11/08/2020 and adamantly refused considering  further surgical resection.  Therefore, he has been kindly referred to [place] today for discussion of potential SBRT treatment to the enlarged periaortic lymph node. He is also scheduled to meet with Dr. Harlen Labs in interventional radiology on 11/22/20 to discuss percutaneous ablation of the renal lesion and will meet with Dr. Tasia Catchings at the [facility] on 11/28/2020 to discuss the potential role for systemic treatment in the future. His caregiver, Riley Stuart, is on the phone and participating with him for today's visit.  PREVIOUS RADIATION THERAPY: No  PAST MEDICAL HISTORY:  Past Medical History:  Diagnosis Date  . Chronic kidney disease    renal insufficiency  . Diabetes mellitus without complication ([name])    Pt takes Insulin  . Hypertension   . Mental disorder    schizoaffective  . Renal cell carcinoma ([place])       PAST SURGICAL HISTORY: Past Surgical History:  Procedure Laterality Date  . ANKLE FRACTURE SURGERY    . COLONOSCOPY WITH PROPOFOL N/A 04/15/2020   Procedure: COLONOSCOPY WITH PROPOFOL;  Surgeon: Virgel Manifold, MD;  Location: [facility] ENDOSCOPY;  Service: Endoscopy;  Laterality: N/A;  . CRANIOTOMY Left 10/27/2013   Procedure: Craniotomy for Aneurysm Clipping;  Surgeon: Consuella Lose, MD;  Location: [place] NEURO ORS;  Service: Neurosurgery;  Laterality: Left;  . KNEE SURGERY     due to fracture.   . ROBOTIC ASSITED PARTIAL NEPHRECTOMY Left 08/10/2019   Procedure: XI ROBOTIC ASSITED LAPAROSCOPIC  PARTIAL NEPHRECTOMY;  Surgeon: Raynelle Bring, MD;  Location: [facility] ORS;  Service: Urology;  Laterality: Left;    FAMILY HISTORY:  Family History  Problem Relation Age of Onset  . Breast cancer Mother   . Throat cancer  Brother   . Prostate cancer Neg Hx   . Colon cancer Neg Hx   . Pancreatic cancer Neg Hx     SOCIAL HISTORY:  Social History   Socioeconomic History  . Marital status: Single    Spouse name: Not on file  . Number of children: 0  . Years of  education: Not on file  . Highest education level: Not on file  Occupational History  . Not on file  Tobacco Use  . Smoking status: Current Every Day Smoker    Packs/day: 0.50    Years: 48.00    Pack years: 24.00    Types: Cigarettes  . Smokeless tobacco: Never Used  Vaping Use  . Vaping Use: Never used  Substance and Sexual Activity  . Alcohol use: Not Currently  . Drug use: Never  . Sexual activity: Not Currently  Other Topics Concern  . Not on file  Social History Narrative  . Not on file   Social Determinants of Health   Financial Resource Strain: Not on file  Food Insecurity: Not on file  Transportation Needs: Not on file  Physical Activity: Not on file  Stress: Not on file  Social Connections: Not on file  Intimate Partner Violence: Not on file    ALLERGIES: Patient has no known allergies.  MEDICATIONS:  Current Outpatient Medications  Medication Sig Dispense Refill  . acetaminophen (TYLENOL) 650 MG CR tablet Take by mouth.    Marland Kitchen amLODipine (NORVASC) 10 MG tablet Take 10 mg by mouth daily.    . carvedilol (COREG) 25 MG tablet Take 25 mg by mouth 2 (two) times daily.    . fenofibrate (TRICOR) 145 MG tablet Take 145 mg by mouth daily.    . furosemide (LASIX) 20 MG tablet Take 10 mg by mouth daily.    Marland Kitchen lamoTRIgine (LAMICTAL) 100 MG tablet Take 100 mg by mouth 2 (two) times daily.    Marland Kitchen NOVOLOG MIX 70/30 FLEXPEN (70-30) 100 UNIT/ML FlexPen Inject 30 Units as directed daily.    Marland Kitchen omeprazole (PRILOSEC) 40 MG capsule Take 40 mg by mouth daily.    . tamsulosin (FLOMAX) 0.4 MG CAPS capsule Take 0.4 mg by mouth daily.     . traMADol (ULTRAM) 50 MG tablet Take 1-2 tablets (50-100 mg total) by mouth every 6 (six) hours as needed for moderate pain or severe pain. 20 tablet 0  . ULTICARE PEN NEEDLES 29G X 12.7MM MISC     . Valbenazine Tosylate (INGREZZA) 40 MG CAPS Take 40 mg by mouth daily.     . ziprasidone (GEODON) 80 MG capsule Take 80 mg by mouth 2 (two) times daily.     . valsartan-hydrochlorothiazide (DIOVAN-HCT) 320-25 MG per tablet Take 1 tablet by mouth daily. (Patient not taking: No sig reported)     No current facility-administered medications for this encounter.    REVIEW OF SYSTEMS:  On review of systems, the patient reports that he is doing well overall. He denies any chest pain, shortness of breath, cough, fevers, chills, night sweats, unintended weight changes. He reports fatigue and lack of energy. He denies any bowel or bladder disturbances, and denies abdominal pain, nausea or vomiting. He reports occasional urinary leakage in the morning. He denies any new musculoskeletal or joint aches or pains. A complete review of systems is obtained and is otherwise negative.  PHYSICAL EXAM:  Wt Readings from Last 3 Encounters:  11/18/20 (P) 234 lb (106.1 kg)  04/15/20 234 lb (106.1 kg)  08/10/19  231 lb 12.8 oz (105.1 kg)   Temp Readings from Last 3 Encounters:  04/15/20 (!) 97.1 F (36.2 C) (Temporal)  08/12/19 100.1 F (37.8 C)  01/21/19 98.2 F (36.8 C) (Oral)   BP Readings from Last 3 Encounters:  04/15/20 (!) 186/104  08/12/19 (!) 161/84  06/24/19 (!) 157/94   Pulse Readings from Last 3 Encounters:  04/15/20 83  08/12/19 69  06/24/19 (!) 53   Pain Assessment Pain Score: 0-No pain/10  Physical exam not performed in light of telephone consult visit format.  KPS = 80  100 - Normal; no complaints; no evidence of disease. 90   - Able to carry on normal activity; minor signs or symptoms of disease. 80   - Normal activity with effort; some signs or symptoms of disease. 11   - Cares for self; unable to carry on normal activity or to do active work. 60   - Requires occasional assistance, but is able to care for most of his personal needs. 50   - Requires considerable assistance and frequent medical care. 53   - Disabled; requires special care and assistance. 6   - Severely disabled; hospital admission is indicated although death not  imminent. 31   - Very sick; hospital admission necessary; active supportive treatment necessary. 10   - Moribund; fatal processes progressing rapidly. 0     - Dead  Karnofsky DA, Abelmann [place], Craver LS and Burchenal [facility] [phone]) The use of the nitrogen mustards in the palliative treatment of carcinoma: with particular reference to bronchogenic carcinoma Cancer 1 634-56  LABORATORY DATA:  Lab Results  Component Value Date   WBC 6.1 08/10/2019   HGB 11.3 (L) 08/12/2019   HCT 34.6 (L) 08/12/2019   MCV 90.2 08/10/2019   PLT 299 08/10/2019   Lab Results  Component Value Date   NA 143 08/12/2019   K 4.3 08/12/2019   CL 109 08/12/2019   CO2 23 08/12/2019   Lab Results  Component Value Date   ALT 8 02/04/2019   AST 11 02/04/2019   ALKPHOS 43 02/04/2019   BILITOT 0.3 02/04/2019     RADIOGRAPHY: No results found.    IMPRESSION/PLAN: This visit was conducted via telephone to spare the patient unnecessary potential exposure in the healthcare setting during the current COVID-19 pandemic. 65. 70 y.o. man with a local recurrence of renal cell carcinoma in the left kidney with regional nodal disease in a left para-aortic node. Today, we talked to the patient and his caregiver, Riley Stuart, about the findings and workup thus far. We discussed the natural history of kidney cancer and general treatment, highlighting the role of radiotherapy in the management. In his case, our recommendation would be to proceed with 5 fractions of stereotactic radiation (SBRT) treatment targeting the left para-aortic lymph node. We discussed the available radiation techniques, and focused on the details and logistics of delivery. We reviewed the anticipated acute and late sequelae associated with radiation in this setting. The patient and Riley Stuart were encouraged to ask questions that were answered to their satisfaction.  At the end of our conversation, the patient would like to proceed with the recommended 5 fraction course  of SBRT to the involved left para-aortic lymph node. We will share our discussion with Dr. Alinda Money and move forward with treatment planning accordingly. He is scheduled to return later today, at 1:30 pm, for CT simulation in anticipation of beginning treatment in the near future.  He also plans to proceed with his scheduled  consult visit with interventional radiology on 11/22/2020 for further discuss percutaneous ablation of the left renal lesion.  He will also meet with Dr. Tasia Catchings on 11/28/2020 to establish care and discuss the potential role of systemic therapy in the future.  We enjoyed meeting with him today and look forward to continuing to participate in his care.   Given current concerns for patient exposure during the COVID-19 pandemic, this encounter was conducted via telephone. The patient was notified in advance and was offered a [place] meeting to allow for face to face communication but unfortunately reported that he did not have the appropriate resources/technology to support such a visit and instead preferred to proceed with telephone consult. The patient has given verbal consent for this type of encounter. The time spent during this encounter was 60 minutes. The attendants for this meeting include Tyler Pita MD, Ashlyn Bruning PA-C, [place], patient, Riley Stuart and his nurse aid Riley Stuart. During the encounter, Tyler Pita MD, Ashlyn Bruning PA-C, and scribe, Riley Stuart were located at [place].  Patient, Riley Stuart and Riley Stuart were located at home.  I spent 60 min on this encounter in total - [place], PA-C    Tyler Pita, MD  [place]: [phone]  Fax: [phone] [email or web address]  Skype  [email or web address]   This document serves as a record of services personally performed by Tyler Pita, MD and Freeman Caldron, PA-C. It was created on  their behalf by Riley Stuart, a trained medical scribe. The creation of this record is based on the scribe's personal observations and the provider's statements to them. This document has been checked and approved by the attending provider.

## 2020-11-21 ENCOUNTER — Encounter: Payer: Self-pay | Admitting: Podiatry

## 2020-11-21 ENCOUNTER — Ambulatory Visit (INDEPENDENT_AMBULATORY_CARE_PROVIDER_SITE_OTHER): Payer: Medicare Other | Admitting: Podiatry

## 2020-11-21 ENCOUNTER — Other Ambulatory Visit: Payer: Self-pay

## 2020-11-21 DIAGNOSIS — M79674 Pain in right toe(s): Secondary | ICD-10-CM

## 2020-11-21 DIAGNOSIS — B351 Tinea unguium: Secondary | ICD-10-CM | POA: Diagnosis not present

## 2020-11-21 DIAGNOSIS — M79675 Pain in left toe(s): Secondary | ICD-10-CM | POA: Diagnosis not present

## 2020-11-21 NOTE — Progress Notes (Signed)
This patient returns to my office for at risk foot care.  This patient requires this care by a professional since this patient will be at risk due to having type 2 diabetes and stage 3 kidney disease  This patient is unable to cut nails himself since the patient cannot reach his nails.These nails are painful walking and wearing shoes. Painful callus both feet.  This patient presents for at risk foot care today.  General Appearance  Alert, conversant and in no acute stress.  Vascular  Dorsalis pedis and posterior tibial  pulses are palpable  bilaterally.  Capillary return is within normal limits  bilaterally. Temperature is within normal limits  bilaterally.  Neurologic  Senn-Weinstein monofilament wire test absent bilaterally. Muscle power within normal limits bilaterally.  Nails Thick disfigured discolored nails with subungual debris  from hallux to fifth toes bilaterally. No evidence of bacterial infection or drainage bilaterally.  Orthopedic  No limitations of motion  feet .  No crepitus or effusions noted.  No bony pathology or digital deformities noted.  HAV  B/L.  Hammer toes  B/L.  Skin  normotropic skin with no porokeratosis noted bilaterally.  No signs of infections or ulcers noted.   Callus sub 1 right and sub hallux right.  Callus left forefoot.  Onychomycosis  Pain in right toes  Pain in left toes  Callus  B/L  Consent was obtained for treatment procedures.   Mechanical debridement of nails 1-5  bilaterally performed with a nail nipper.  Filed with dremel without incident.  Debride callus with # 15 blade followed by dremel tool.  Patient qualifies for diabetic shoes due to DPN, HAV  B/L and hammer toes  B/L.   Return office visit   3 months                  Told patient to return for periodic foot care and evaluation due to potential at risk complications.   Gardiner Barefoot DPM

## 2020-11-22 ENCOUNTER — Encounter: Payer: Self-pay | Admitting: *Deleted

## 2020-11-22 ENCOUNTER — Ambulatory Visit
Admission: RE | Admit: 2020-11-22 | Discharge: 2020-11-22 | Disposition: A | Discharge status: Home / Self Care | Payer: Medicare Other | Source: Ambulatory Visit | Attending: Urology | Admitting: Urology

## 2020-11-22 DIAGNOSIS — C642 Malignant neoplasm of left kidney, except renal pelvis: Secondary | ICD-10-CM

## 2020-11-22 HISTORY — PX: IR RADIOLOGIST EVAL & MGMT: IMG5224

## 2020-11-22 NOTE — Consult Note (Signed)
Chief Complaint: History of left-sided papillary renal carcinoma, post partial resection, now with concern for local recurrence and metastatic disease.  Referring Physician(s): Noland Fordyce  History of Present Illness: Riley Stuart is a 70 y.o. male with past medical history significant for chronic renal disease, diabetes, hypertension and schizoaffective disorder who underwent partial left-sided nephrectomy by Dr. Alinda Money on 08/10/2019.  Unfortunately, surveillance imaging demonstrates concern for local recurrence as well as local metastatic disease involving the left periodic lymph node and as such the patient has been referred to interventional radiology for potential percutaneous management.  Note while the patient has schizoaffective disorder and lives in a group home, he is quite functional and serves as his own healthcare power of attorney.  Dr. Alinda Money has offered the patient definitive surgical resection as well as regional lymph node dissection however the patient has refused given his chronic renal insufficiency and potential for requiring dialysis after definitive nephrectomy.  In regards to the pathologically enlarged left-sided para-aortic lymph node, the patient is being evaluated and will be treated with external radiation by Dr. Tammi Klippel.    Past Medical History:  Diagnosis Date  . Chronic kidney disease    renal insufficiency  . Diabetes mellitus without complication ([name])    Pt takes Insulin  . Hypertension   . Mental disorder    schizoaffective  . Renal cell carcinoma [facility])     Past Surgical History:  Procedure Laterality Date  . ANKLE FRACTURE SURGERY    . COLONOSCOPY WITH PROPOFOL N/A 04/15/2020   Procedure: COLONOSCOPY WITH PROPOFOL;  Surgeon: Virgel Manifold, MD;  Location: [facility] ENDOSCOPY;  Service: Endoscopy;  Laterality: N/A;  . CRANIOTOMY Left 10/27/2013   Procedure: Craniotomy for Aneurysm Clipping;  Surgeon: Consuella Lose, MD;  Location: [place] NEURO ORS;  Service: Neurosurgery;  Laterality: Left;  . KNEE SURGERY     due to fracture.   . ROBOTIC ASSITED PARTIAL NEPHRECTOMY Left 08/10/2019   Procedure: XI ROBOTIC ASSITED LAPAROSCOPIC  PARTIAL NEPHRECTOMY;  Surgeon: Raynelle Bring, MD;  Location: [facility] ORS;  Service: Urology;  Laterality: Left;    Allergies: Patient has no known allergies.  Medications: Prior to Admission medications   Medication Sig Start Date End Date Taking? Authorizing Provider  acetaminophen (TYLENOL) 650 MG CR tablet Take by mouth.    [provider]  amLODipine (NORVASC) 10 MG tablet Take 10 mg by mouth daily. 07/27/19   [provider]  carvedilol (COREG) 25 MG tablet Take 25 mg by mouth 2 (two) times daily. 07/16/19   [provider]  fenofibrate (TRICOR) 145 MG tablet Take 145 mg by mouth daily.    [provider]  furosemide (LASIX) 20 MG tablet Take 10 mg by mouth daily. 07/21/19   [provider]  lamoTRIgine (LAMICTAL) 100 MG tablet Take 100 mg by mouth 2 (two) times daily.    [provider]  NOVOLOG MIX 70/30 FLEXPEN (70-30) 100 UNIT/ML FlexPen Inject 30 Units as directed daily. 06/24/19   [provider]  omeprazole (PRILOSEC) 40 MG capsule Take 40 mg by mouth daily. 10/05/20   [provider]  tamsulosin (FLOMAX) 0.4 MG CAPS capsule Take 0.4 mg by mouth daily.  02/06/19   [provider]  traMADol (ULTRAM) 50 MG tablet Take 1-2 tablets (50-100 mg total) by mouth every 6 (six) hours as needed for moderate pain or severe pain. 08/10/19   Debbrah Alar, PA-C  ULTICARE PEN NEEDLES 29G X 12.7MM MISC  11/02/19  [provider]  Valbenazine Tosylate (INGREZZA) 40 MG CAPS Take 40 mg by mouth daily.     [provider]  valsartan-hydrochlorothiazide (DIOVAN-HCT) 320-25 MG per tablet Take 1 tablet by mouth daily.    [provider]  ziprasidone (GEODON) 80 MG capsule Take 80 mg by  mouth 2 (two) times daily. 07/27/19   [provider]     Family History  Problem Relation Age of Onset  . Breast cancer Mother   . Throat cancer Brother   . Prostate cancer Neg Hx   . Colon cancer Neg Hx   . Pancreatic cancer Neg Hx     Social History   Socioeconomic History  . Marital status: Single    Spouse name: Not on file  . Number of children: 0  . Years of education: Not on file  . Highest education level: Not on file  Occupational History  . Not on file  Tobacco Use  . Smoking status: Current Every Day Smoker    Packs/day: 0.50    Years: 48.00    Pack years: 24.00    Types: Cigarettes  . Smokeless tobacco: Never Used  Vaping Use  . Vaping Use: Never used  Substance and Sexual Activity  . Alcohol use: Not Currently  . Drug use: Never  . Sexual activity: Not Currently  Other Topics Concern  . Not on file  Social History Narrative  . Not on file   Social Determinants of Health   Financial Resource Strain: Not on file  Food Insecurity: Not on file  Transportation Needs: Not on file  Physical Activity: Not on file  Stress: Not on file  Social Connections: Not on file    ECOG Status: 1 - Symptomatic but completely ambulatory  Review of Systems  Review of Systems: A 12 point ROS discussed and pertinent positives are indicated in the HPI above.  All other systems are negative.  Physical Exam No direct physical exam was performed (except for noted visual exam findings with Video Visits).   Vital Signs: There were no vitals taken for this visit.  Imaging:  Abdominal MRI-10/10/2020; 6-20 04/2020; 06/19/2019  Personal review of abdominal MRI performed 10/10/2020 demonstrates an ill-defined approximately 2.4 x 1.8 cm area of intermediate T2 and increased T1 signal adjacent to the partial nephrectomy resection bed suspicious for local recurrence (representative images 41-44, series 10), suboptimally evaluated secondary to patient motion artifact.   Additionally, there is been development of a pathologically enlarged left periaortic lymph node measuring at least 2.1 cm in greatest short axis diameter worrisome for local metastatic disease   Labs:  CBC: No results for input(s): WBC, HGB, HCT, PLT in the last 8760 hours.  COAGS: No results for input(s): INR, APTT in the last 8760 hours.  BMP: No results for input(s): NA, K, CL, CO2, GLUCOSE, BUN, CALCIUM, CREATININE, GFRNONAA, GFRAA in the last 8760 hours.  Invalid input(s): CMP  LIVER FUNCTION TESTS: No results for input(s): BILITOT, AST, ALT, ALKPHOS, PROT, ALBUMIN in the last 8760 hours.  TUMOR MARKERS: No results for input(s): AFPTM, CEA, CA199, CHROMGRNA in the last 8760 hours.  Assessment and Plan:  Riley Stuart is a 70 y.o. male with past medical history significant for chronic renal disease, diabetes, hypertension and schizoaffective disorder who underwent partial left-sided nephrectomy by Dr. Alinda Money on 08/10/2019.  Unfortunately, surveillance imaging demonstrates concern for local recurrence as well as local metastatic disease involving the left periodic lymph node and as such the patient has  been referred to interventional radiology for potential percutaneous management.  (note, Dr. Alinda Money has offered the patient definitive surgical resection as well as regional lymph node dissection however the patient has refused given his chronic renal insufficiency and potential for requiring dialysis after definitive nephrectomy.)  Personal review of abdominal MRI performed 10/10/2020 demonstrates an ill-defined approximately 2.4 x 1.8 cm area of intermediate T2 and increased T1 signal adjacent to the partial nephrectomy resection bed suspicious for local recurrence (representative images 41-44, series 10), suboptimally evaluated secondary to patient motion artifact.    Additionally, there is been development of a pathologically enlarged left periaortic lymph node measuring at  least 2.1 cm in greatest short axis diameter worrisome for local metastatic disease (In regards to the pathologically enlarged left-sided para-aortic lymph node, the patient is being evaluated and will be treated with external radiation by Dr. Tammi Klippel).  The left-sided renal lesion is very ill-defined on the though I am hopeful the patient may ultimately be a candidate for attempted left-sided renal cryoablation.  As such, preliminary conversations were held with the patient regarding the benefits and risks of left-sided renal cryoablation including (but not limited to) bleeding, injury to adjacent organ, urine leak and postprocedural pain/neuropathy.  As the left-sided renal lesion is very ill-defined I will first proceed with the acquisition of a noncontrast abdominal CT for preprocedural evaluation (unfortunately, contrast will not be able to be administered secondary to patient's chronic renal insufficiency with baseline creatinine hovering around 3).  Following acquisition of noncontrast abdominal CT, I will hold a repeat telemedicine consultation with patient for further discussion of potential left-sided renal cryoablation.  Note, repeat telemedicine consultation will be performed secondary to patient's capable though limited mental capacity as per his request.  PLAN: -Follow-up telemedicine consultation following acquisition of noncontrast CT scan of the abdomen CT scan may be performed at [facility] as the patient lives in a group home in [place]).  Patient states that appointments may be arranged via his primary caretaking nurse/care giver, Bryson Ha.  Thank you for this interesting consult.  I greatly enjoyed meeting [employer] and look forward to participating in their care.  A copy of this report was sent to the requesting provider on this date.  Electronically Signed: Sandi Mariscal 11/22/2020, 3:19 PM   I spent a total of 30 Minutes in  remote  clinical consultation, greater than 50% of which was counseling/coordinating care for left-sided renal carcinoma.    Visit type: Audio only (telephone). Audio (no video) only due to patient's lack of internet/smartphone capability. Alternative for in-person consultation at [facility], [address] [place], [place], [place]. This visit type was conducted due to national recommendations for restrictions regarding the COVID-19 Pandemic (e.g. social distancing).  This format is felt to be most appropriate for this patient at this time.  All issues noted in this document were discussed and addressed.

## 2020-11-23 ENCOUNTER — Other Ambulatory Visit: Payer: Self-pay | Admitting: Interventional Radiology

## 2020-11-23 DIAGNOSIS — C642 Malignant neoplasm of left kidney, except renal pelvis: Secondary | ICD-10-CM

## 2020-11-25 DIAGNOSIS — C642 Malignant neoplasm of left kidney, except renal pelvis: Secondary | ICD-10-CM | POA: Diagnosis present

## 2020-11-28 ENCOUNTER — Encounter: Payer: Self-pay | Admitting: Oncology

## 2020-11-28 ENCOUNTER — Inpatient Hospital Stay: Payer: Medicare Other

## 2020-11-28 ENCOUNTER — Inpatient Hospital Stay: Payer: Medicare Other | Attending: Oncology | Admitting: Oncology

## 2020-11-28 VITALS — BP 155/77 | HR 47 | Temp 96.6°F | Resp 18 | Wt 239.3 lb

## 2020-11-28 DIAGNOSIS — C642 Malignant neoplasm of left kidney, except renal pelvis: Secondary | ICD-10-CM | POA: Diagnosis present

## 2020-11-28 DIAGNOSIS — Z7189 Other specified counseling: Secondary | ICD-10-CM

## 2020-11-28 LAB — CBC WITH DIFFERENTIAL/PLATELET
Abs Immature Granulocytes: 0.03 10*3/uL (ref 0.00–0.07)
Basophils Absolute: 0 10*3/uL (ref 0.0–0.1)
Basophils Relative: 1 %
Eosinophils Absolute: 0 10*3/uL (ref 0.0–0.5)
Eosinophils Relative: 0 %
HCT: 34.6 % — ABNORMAL LOW (ref 39.0–52.0)
Hemoglobin: 11.8 g/dL — ABNORMAL LOW (ref 13.0–17.0)
Immature Granulocytes: 1 %
Lymphocytes Relative: 28 %
Lymphs Abs: 1.5 10*3/uL (ref 0.7–4.0)
MCH: 30.5 pg (ref 26.0–34.0)
MCHC: 34.1 g/dL (ref 30.0–36.0)
MCV: 89.4 fL (ref 80.0–100.0)
Monocytes Absolute: 0.5 10*3/uL (ref 0.1–1.0)
Monocytes Relative: 9 %
Neutro Abs: 3.3 10*3/uL (ref 1.7–7.7)
Neutrophils Relative %: 61 %
Platelets: 285 10*3/uL (ref 150–400)
RBC: 3.87 MIL/uL — ABNORMAL LOW (ref 4.22–5.81)
RDW: 16.8 % — ABNORMAL HIGH (ref 11.5–15.5)
WBC: 5.3 10*3/uL (ref 4.0–10.5)
nRBC: 0 % (ref 0.0–0.2)

## 2020-11-28 LAB — COMPREHENSIVE METABOLIC PANEL
ALT: 11 U/L (ref 0–44)
AST: 14 U/L — ABNORMAL LOW (ref 15–41)
Albumin: 3.6 g/dL (ref 3.5–5.0)
Alkaline Phosphatase: 49 U/L (ref 38–126)
Anion gap: 9 (ref 5–15)
BUN: 22 mg/dL (ref 8–23)
CO2: 26 mmol/L (ref 22–32)
Calcium: 8.9 mg/dL (ref 8.9–10.3)
Chloride: 108 mmol/L (ref 98–111)
Creatinine, Ser: 2.95 mg/dL — ABNORMAL HIGH (ref 0.61–1.24)
GFR, Estimated: 22 mL/min — ABNORMAL LOW (ref >60)
Glucose, Bld: 107 mg/dL — ABNORMAL HIGH (ref 70–99)
Potassium: 3.8 mmol/L (ref 3.5–5.1)
Sodium: 143 mmol/L (ref 135–145)
Total Bilirubin: 0.6 mg/dL (ref 0.3–1.2)
Total Protein: 6.9 g/dL (ref 6.5–8.1)

## 2020-11-28 LAB — LACTATE DEHYDROGENASE: LDH: 143 U/L (ref 98–192)

## 2020-11-28 NOTE — Progress Notes (Signed)
Hematology/Oncology Consult note [facility] Telephone:([phone] [phone]   Patient Care Team: Jodi Marble, MD as PCP - General (Internal Medicine) Marden Noble, MD (Internal Medicine)  REFERRING PROVIDER: Raynelle Bring, MD  CHIEF COMPLAINTS/REASON FOR VISIT:  Evaluation of RCC  HISTORY OF PRESENTING ILLNESS:   Riley Stuart is a  70 y.o.  male with PMH listed below was seen in consultation at the request of  Raynelle Bring, MD  for evaluation of [place] Patient has schizoaffective disorder and lives in the group home.  He is quite functional at baseline and makes his own medical decision. 11/27/2018 CT abdomen pelvis without contrast showed solid-appearing mass of the lower pole of the left kidney measuring up to 6.8 cm concerning for RCC. 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 08/10/2019, patient underwent partial left nephrectomy and results showed papillary renal cell carcinoma, type II, nuclear grade 3.  Size 9 cm.  Tumor invades perirenal fat (pT3a).  Patient went image surveillance. 10/10/2020 , MRI abdomen with and without contrast showed new 2.4 x 1.8 cm faintly enhancing nodularity along the left kidney lower pole partial nephrectomy sites.  Suspicious for local recurrence.  New pathologically enlarged left periaortic lymph node, highly suspicious for malignancy in this context.  Numerous bilateral renal cysts.:  Diverticulosis lumbar spondylosis and degenerative disease 1.4 gallstone in the gallbladder.  Per note, Dr. Alinda Money has offered patient definitive surgical resection as well as regional lymph node dissection however patient declined the surgery due to his chronic kidney insufficiency and potential need of requiring dialysis after radical nephrectomy.  She was evaluated by radiation oncology Dr. Tammi Klippel and he will start SBRT to the periaortic lymph node.  He was also seen by Dr. Pascal Lux for  possible cryoablation.  Dr. Pascal Lux recommend repeat CT abdomen which has been ordered.  Patient was referred to establish care with oncology for evaluation of adjuvant treatments.  Patient is a poor historian.  He denies any pain at this point.  No dysuria, hematuria .  He was accompanied by group home staff.  Review of Systems  Constitutional: Negative for appetite change, chills, fatigue, fever and unexpected weight change.  HENT:   Negative for hearing loss and voice change.   Eyes: Negative for eye problems and icterus.  Respiratory: Negative for chest tightness, cough and shortness of breath.   Cardiovascular: Negative for chest pain and leg swelling.  Gastrointestinal: Negative for abdominal distention and abdominal pain.  Endocrine: Negative for hot flashes.  Genitourinary: Negative for difficulty urinating, dysuria and frequency.   Musculoskeletal: Negative for arthralgias.  Skin: Negative for itching and rash.  Neurological: Negative for light-headedness and numbness.  Hematological: Negative for adenopathy. Does not bruise/bleed easily.  Psychiatric/Behavioral: Negative for confusion.    MEDICAL HISTORY:  Past Medical History:  Diagnosis Date  . Chronic kidney disease    renal insufficiency  . Diabetes mellitus without complication ([name])    Pt takes Insulin  . Hypertension   . Mental disorder    schizoaffective  . Renal cell carcinoma ([place])     SURGICAL HISTORY: Past Surgical History:  Procedure Laterality Date  . ANKLE FRACTURE SURGERY    . COLONOSCOPY WITH PROPOFOL N/A 04/15/2020   Procedure: COLONOSCOPY WITH PROPOFOL;  Surgeon: Virgel Manifold, MD;  Location: [facility] ENDOSCOPY;  Service: Endoscopy;  Laterality: N/A;  . CRANIOTOMY Left 10/27/2013   Procedure: Craniotomy for Aneurysm Clipping;  Surgeon: Consuella Lose, MD;  Location: [facility] NEURO ORS;  Service: Neurosurgery;  Laterality: Left;  . IR RADIOLOGIST EVAL & MGMT  11/22/2020  . KNEE SURGERY     due to  fracture.   . ROBOTIC ASSITED PARTIAL NEPHRECTOMY Left 08/10/2019   Procedure: XI ROBOTIC ASSITED LAPAROSCOPIC  PARTIAL NEPHRECTOMY;  Surgeon: Raynelle Bring, MD;  Location: [facility]S;  Service: Urology;  Laterality: Left;    SOCIAL HISTORY: Social History   Socioeconomic History  . Marital status: Single    Spouse name: Not on file  . Number of children: 0  . Years of education: Not on file  . Highest education level: Not on file  Occupational History  . Not on file  Tobacco Use  . Smoking status: Current Every Day Smoker    Packs/day: 0.50    Years: 48.00    Pack years: 24.00    Types: Cigarettes  . Smokeless tobacco: Never Used  Vaping Use  . Vaping Use: Never used  Substance and Sexual Activity  . Alcohol use: Not Currently  . Drug use: Never  . Sexual activity: Not Currently  Other Topics Concern  . Not on file  Social History Narrative  . Not on file   Social Determinants of Health   Financial Resource Strain: Not on file  Food Insecurity: Not on file  Transportation Needs: Not on file  Physical Activity: Not on file  Stress: Not on file  Social Connections: Not on file  Intimate Partner Violence: Not on file    FAMILY HISTORY: Family History  Problem Relation Age of Onset  . Breast cancer Mother   . Throat cancer Brother   . Prostate cancer Neg Hx   . Colon cancer Neg Hx   . Pancreatic cancer Neg Hx     ALLERGIES:  has No Known Allergies.  MEDICATIONS:  Current Outpatient Medications  Medication Sig Dispense Refill  . acetaminophen (TYLENOL) 650 MG CR tablet Take by mouth.    Marland Kitchen amLODipine (NORVASC) 10 MG tablet Take 10 mg by mouth daily.    . carvedilol (COREG) 25 MG tablet Take 25 mg by mouth 2 (two) times daily.    . fenofibrate (TRICOR) 145 MG tablet Take 145 mg by mouth daily.    . furosemide (LASIX) 20 MG tablet Take 10 mg by mouth daily.    Marland Kitchen lamoTRIgine (LAMICTAL) 100 MG tablet Take 100 mg by mouth 2 (two) times daily.    Marland Kitchen NOVOLOG MIX  70/30 FLEXPEN (70-30) 100 UNIT/ML FlexPen Inject 30 Units as directed daily.    Marland Kitchen omeprazole (PRILOSEC) 40 MG capsule Take 40 mg by mouth daily.    . tamsulosin (FLOMAX) 0.4 MG CAPS capsule Take 0.4 mg by mouth daily.     Marland Kitchen ULTICARE PEN NEEDLES 29G X 12.7MM MISC     . Valbenazine Tosylate (INGREZZA) 40 MG CAPS Take 40 mg by mouth daily.     . ziprasidone (GEODON) 80 MG capsule Take 80 mg by mouth 2 (two) times daily.     No current facility-administered medications for this visit.     PHYSICAL EXAMINATION: ECOG PERFORMANCE STATUS: 1 - Symptomatic but completely ambulatory Vitals:   11/28/20 1101  BP: (!) 155/77  Pulse: (!) 47  Resp: 18  Temp: (!) 96.6 F (35.9 C)  SpO2: 100%   Filed Weights   11/28/20 1101  Weight: 239 lb 5 oz (108.6 kg)    Physical Exam Constitutional:      General: He is not in acute distress. HENT:     Head: Normocephalic  and atraumatic.  Eyes:     General: No scleral icterus. Cardiovascular:     Rate and Rhythm: Normal rate and regular rhythm.     Heart sounds: Normal heart sounds.  Pulmonary:     Effort: Pulmonary effort is normal. No respiratory distress.     Breath sounds: No wheezing.  Abdominal:     General: Bowel sounds are normal. There is no distension.     Palpations: Abdomen is soft.  Musculoskeletal:        General: No deformity. Normal range of motion.     Cervical back: Normal range of motion and neck supple.  Skin:    General: Skin is warm and dry.     Findings: No erythema or rash.  Neurological:     Mental Status: He is alert and oriented to person, place, and time. Mental status is at baseline.     Cranial Nerves: No cranial nerve deficit.     Coordination: Coordination normal.  Psychiatric:        Mood and Affect: Mood normal.     LABORATORY DATA:  I have reviewed the data as listed Lab Results  Component Value Date   WBC 5.3 11/28/2020   HGB 11.8 (L) 11/28/2020   HCT 34.6 (L) 11/28/2020   MCV 89.4 11/28/2020    PLT 285 11/28/2020   Recent Labs    11/28/20 1146  NA 143  K 3.8  CL 108  CO2 26  GLUCOSE 107*  BUN 22  CREATININE 2.95*  CALCIUM 8.9  GFRNONAA 22*  PROT 6.9  ALBUMIN 3.6  AST 14*  ALT 11  ALKPHOS 49  BILITOT 0.6   Iron/TIBC/Ferritin/ %Sat No results found for: IRON, TIBC, FERRITIN, IRONPCTSAT    RADIOGRAPHIC STUDIES: I have personally reviewed the radiological images as listed and agreed with the findings in the report. IR Radiologist Eval & Mgmt  Result Date: 11/22/2020 Please refer to notes tab for details about interventional procedure. (Op Note)     ASSESSMENT & PLAN:  1. Cancer of kidney, left ([place])   2. Goals of care, counseling/discussion   Cancer Staging Cancer of kidney, left [facility]) Staging form: Kidney, AJCC 8th Edition - Clinical stage from 11/28/2020: Stage III (rcT1a, cN1, cM0) - Signed by Earlie Server, MD on 11/28/2020   Recurrent left papillary renal cell carcinoma, nodal involvement. Patient declined surgery. Recommend patient to proceed with SBRT as planned. Pending IR decision for cryoablation.  Patient has CT abdomen ordered. Recommend to obtain CT chest without contrast as well. Discussed with patient that I recommend patient to proceed with adjuvant Keytruda.  He is interested and prefers to discuss again after he finishes SBRT +/- cryoablation.  I discussed the mechanism of action and rationale of using immunotherapy.  The goal of therapy is palliative; and length of treatments are likely ongoing/based upon the results of the scans. Discussed the potential side effects of immunotherapy including but not limited to diarrhea; skin rash; respiratory failure, kidney failure, mental status change, elevated LFTs/liver failure,endocrine abnormalities, acute deterioration  and even death,etc. patient voices understanding and agrees with proceeding with treatment after he finishes local definitive treatments.  Chemo class with [place].  I asked patient/group  home to give [place] a call after patient finishes above plan.   Orders Placed This Encounter  Procedures  . CT CHEST WO CONTRAST    Standing Status:   Future    Standing Expiration Date:   11/28/2021    Order Specific Question:  Preferred imaging location?    Answer:   [facility]    Order Specific Question:   Radiology Contrast Protocol - do NOT remove file path    Answer:   [email or web address]  . CBC with Differential/Platelet    Standing Status:   Future    Number of Occurrences:   1    Standing Expiration Date:   11/28/2021  . Comprehensive metabolic panel    Standing Status:   Future    Number of Occurrences:   1    Standing Expiration Date:   11/28/2021  . Lactate dehydrogenase    Standing Status:   Future    Number of Occurrences:   1    Standing Expiration Date:   11/28/2021    All questions were answered. The patient knows to call the clinic with any problems questions or concerns.  cc Raynelle Bring, MD    Return of visit:  Thank you for this kind referral and the opportunity to participate in the care of this patient. A copy of today's note is routed to referring provider    Earlie Server, MD, PhD Hematology Oncology [facility] Pager- [phone] 11/28/2020

## 2020-11-29 ENCOUNTER — Ambulatory Visit
Admission: RE | Admit: 2020-11-29 | Discharge: 2020-11-29 | Disposition: A | Discharge status: Home / Self Care | Payer: Medicare Other | Source: Ambulatory Visit | Attending: Radiation Oncology | Admitting: Radiation Oncology

## 2020-11-29 ENCOUNTER — Other Ambulatory Visit: Payer: Self-pay

## 2020-11-29 DIAGNOSIS — C642 Malignant neoplasm of left kidney, except renal pelvis: Secondary | ICD-10-CM | POA: Diagnosis not present

## 2020-11-30 ENCOUNTER — Ambulatory Visit: Payer: Medicare Other

## 2020-12-01 ENCOUNTER — Other Ambulatory Visit: Payer: Self-pay

## 2020-12-01 ENCOUNTER — Ambulatory Visit
Admission: RE | Admit: 2020-12-01 | Discharge: 2020-12-01 | Disposition: A | Discharge status: Home / Self Care | Payer: Medicare Other | Source: Ambulatory Visit | Attending: Radiation Oncology | Admitting: Radiation Oncology

## 2020-12-01 ENCOUNTER — Telehealth: Payer: Self-pay | Admitting: *Deleted

## 2020-12-01 ENCOUNTER — Other Ambulatory Visit: Payer: Medicare Other

## 2020-12-01 DIAGNOSIS — C642 Malignant neoplasm of left kidney, except renal pelvis: Secondary | ICD-10-CM | POA: Diagnosis not present

## 2020-12-01 NOTE — Telephone Encounter (Signed)
I'm calling to see if I can schedule you an appointment for a Diabetic shoe measurement on Monday, 12/05/2020 in the morning.  His wife stated,"He has an appointment at 10 am in [place]."  I can schedule him for 8:15 am.  "Okay, that will be fine."  When was the last time you saw your primary care doctor or whomever treats you for Diabetes?  "I saw her three months ago but I'm going to see her next week."  Okay great, you must have seen your doctor recently so we can get authorization from them for your Diabetic shoes.

## 2020-12-02 ENCOUNTER — Ambulatory Visit: Payer: Medicare Other

## 2020-12-05 ENCOUNTER — Ambulatory Visit
Admission: RE | Admit: 2020-12-05 | Discharge: 2020-12-05 | Disposition: A | Discharge status: Home / Self Care | Payer: Medicare Other | Source: Ambulatory Visit | Attending: Radiation Oncology | Admitting: Radiation Oncology

## 2020-12-05 ENCOUNTER — Ambulatory Visit: Payer: Medicare Other

## 2020-12-05 ENCOUNTER — Other Ambulatory Visit: Payer: Self-pay

## 2020-12-05 DIAGNOSIS — M2010 Hallux valgus (acquired), unspecified foot: Secondary | ICD-10-CM

## 2020-12-05 DIAGNOSIS — M79674 Pain in right toe(s): Secondary | ICD-10-CM

## 2020-12-05 DIAGNOSIS — C642 Malignant neoplasm of left kidney, except renal pelvis: Secondary | ICD-10-CM

## 2020-12-05 DIAGNOSIS — M2042 Other hammer toe(s) (acquired), left foot: Secondary | ICD-10-CM

## 2020-12-05 DIAGNOSIS — M2041 Other hammer toe(s) (acquired), right foot: Secondary | ICD-10-CM

## 2020-12-05 DIAGNOSIS — E0843 Diabetes mellitus due to underlying condition with diabetic autonomic (poly)neuropathy: Secondary | ICD-10-CM

## 2020-12-05 NOTE — Progress Notes (Signed)
Patient presents to the office today for diabetic shoe and insole measuring.  Patient was measured with brannock device to determine size and width for 1 pair of extra depth shoes and foam casted for 3 pair of insoles.   Documentation of medical necessity will be sent to patient's treating diabetic doctor to verify and sign.   Patient's diabetic provider: Dr. Jodi Marble  Shoes and insoles will be ordered at that time and patient will be notified for an appointment for fitting when they arrive.   Shoe size (per patient): 11 regular   Brannock measurement: 11 1/2 left, 10 1/2 right Patient shoe selection-   1st choice:   [id number] - [employer] Knit   2nd choice:  [id number] - [id number]  Shoe size ordered: 11 regular

## 2020-12-07 ENCOUNTER — Ambulatory Visit
Admission: RE | Admit: 2020-12-07 | Discharge: 2020-12-07 | Disposition: A | Discharge status: Home / Self Care | Payer: Medicare Other | Source: Ambulatory Visit | Attending: Radiation Oncology | Admitting: Radiation Oncology

## 2020-12-07 ENCOUNTER — Other Ambulatory Visit: Payer: Self-pay

## 2020-12-07 DIAGNOSIS — C642 Malignant neoplasm of left kidney, except renal pelvis: Secondary | ICD-10-CM

## 2020-12-09 ENCOUNTER — Ambulatory Visit
Admission: RE | Admit: 2020-12-09 | Discharge: 2020-12-09 | Disposition: A | Discharge status: Home / Self Care | Payer: Medicare Other | Source: Ambulatory Visit | Attending: Radiation Oncology | Admitting: Radiation Oncology

## 2020-12-09 ENCOUNTER — Other Ambulatory Visit: Payer: Self-pay

## 2020-12-09 ENCOUNTER — Encounter: Payer: Self-pay | Admitting: Urology

## 2020-12-09 DIAGNOSIS — C642 Malignant neoplasm of left kidney, except renal pelvis: Secondary | ICD-10-CM | POA: Diagnosis not present

## 2020-12-09 NOTE — Patient Instructions (Signed)

## 2020-12-12 ENCOUNTER — Inpatient Hospital Stay: Payer: Medicare Other

## 2020-12-15 ENCOUNTER — Ambulatory Visit
Admission: RE | Admit: 2020-12-15 | Discharge: 2020-12-15 | Disposition: A | Discharge status: Home / Self Care | Payer: Medicare Other | Source: Ambulatory Visit | Attending: Interventional Radiology | Admitting: Interventional Radiology

## 2020-12-15 ENCOUNTER — Other Ambulatory Visit: Payer: Self-pay

## 2020-12-15 DIAGNOSIS — C642 Malignant neoplasm of left kidney, except renal pelvis: Secondary | ICD-10-CM | POA: Insufficient documentation

## 2020-12-20 ENCOUNTER — Other Ambulatory Visit: Payer: Self-pay | Admitting: Oncology

## 2020-12-21 ENCOUNTER — Other Ambulatory Visit: Payer: Self-pay

## 2020-12-21 ENCOUNTER — Ambulatory Visit
Admission: RE | Admit: 2020-12-21 | Discharge: 2020-12-21 | Disposition: A | Discharge status: Home / Self Care | Payer: Medicare Other | Source: Ambulatory Visit | Attending: Interventional Radiology | Admitting: Interventional Radiology

## 2020-12-21 ENCOUNTER — Encounter: Payer: Self-pay | Admitting: *Deleted

## 2020-12-21 ENCOUNTER — Other Ambulatory Visit: Payer: Self-pay | Admitting: Interventional Radiology

## 2020-12-21 DIAGNOSIS — C649 Malignant neoplasm of unspecified kidney, except renal pelvis: Secondary | ICD-10-CM

## 2020-12-21 DIAGNOSIS — C642 Malignant neoplasm of left kidney, except renal pelvis: Secondary | ICD-10-CM

## 2020-12-21 HISTORY — PX: IR RADIOLOGIST EVAL & MGMT: IMG5224

## 2020-12-21 NOTE — Progress Notes (Signed)
Patient ID: Riley Stuart, male   DOB: 06-03-1951, 70 y.o.   MRN: [id number]         Chief Complaint: Left-sided papillary renal carcinoma, post partial resection, now with concern for local recurrence and metastatic disease.    Referring Physician(s): Alinda Money (Urology) Tammi Klippel (Radiation Oncology) Tasia Catchings (Oncology  History of Present Illness:  Riley Stuart is a 70 y.o. male with past medical history significant for chronic renal disease, diabetes, hypertension and schizoaffective disorder who underwent partial left-sided nephrectomy by Dr. Alinda Money on 08/10/2019.  Unfortunately, surveillance imaging demonstrated concern for local recurrence as well as local metastatic disease involving the left periodic lymph node and as such the patient has been referred to interventional radiology for potential percutaneous management, as Dr. Alinda Money has offered the patient definitive surgical resection as well as regional lymph node dissection however the patient has refused given his chronic renal insufficiency and potential for requiring dialysis after definitive nephrectomy.  (Note while the patient has schizoaffective disorder and lives in a group home, he is quite functional and serves as his own healthcare power of attorney.)  The patient was initially evaluated by the interventional radiology service on 11/22/2020 at which time Riley Stuart elected to proceed with acquisition of procedural planning a noncontrast CT scan of the abdomen for procedural planning purposes.  Unfortunately, that examination demonstrates findings worrisome for (at least) regional metastatic disease with development of 3 perinephric nodules as well as worsening adenopathy within the more caudal aspect of the left side of the retroperitoneum.  Of note, since the time of the initial consultation the patient has undergone external radiation to the dominant pathologically enlarged left periaortic lymph node.   Past Medical  History:  Diagnosis Date  . Chronic kidney disease    renal insufficiency  . Diabetes mellitus without complication ([name])    Pt takes Insulin  . Hypertension   . Mental disorder    schizoaffective  . Renal cell carcinoma [facility])     Past Surgical History:  Procedure Laterality Date  . ANKLE FRACTURE SURGERY    . COLONOSCOPY WITH PROPOFOL N/A 04/15/2020   Procedure: COLONOSCOPY WITH PROPOFOL;  Surgeon: Virgel Manifold, MD;  Location: [facility] ENDOSCOPY;  Service: Endoscopy;  Laterality: N/A;  . CRANIOTOMY Left 10/27/2013   Procedure: Craniotomy for Aneurysm Clipping;  Surgeon: Consuella Lose, MD;  Location: [facility];  Service: Neurosurgery;  Laterality: Left;  . IR RADIOLOGIST EVAL & MGMT  11/22/2020  . KNEE SURGERY     due to fracture.   . ROBOTIC ASSITED PARTIAL NEPHRECTOMY Left 08/10/2019   Procedure: XI ROBOTIC ASSITED LAPAROSCOPIC  PARTIAL NEPHRECTOMY;  Surgeon: Raynelle Bring, MD;  Location: [facility];  Service: Urology;  Laterality: Left;    Allergies: Patient has no known allergies.  Medications: Prior to Admission medications   Medication Sig Start Date End Date Taking? Authorizing Provider  acetaminophen (TYLENOL) 650 MG CR tablet Take by mouth.    [provider]  amLODipine (NORVASC) 10 MG tablet Take 10 mg by mouth daily. 07/27/19   [provider]  carvedilol (COREG) 25 MG tablet Take 25 mg by mouth 2 (two) times daily. 07/16/19   [provider]  fenofibrate (TRICOR) 145 MG tablet Take 145 mg by mouth daily.    [provider]  furosemide (LASIX) 20 MG tablet Take 10 mg by mouth daily. 07/21/19   [provider]  lamoTRIgine (LAMICTAL) 100 MG tablet Take 100 mg by mouth 2 (two) times daily.  [provider]  NOVOLOG MIX 70/30 FLEXPEN (70-30) 100 UNIT/ML FlexPen Inject 30 Units as directed daily. 06/24/19   [provider]  omeprazole (PRILOSEC) 40 MG capsule Take 40 mg by mouth daily. 10/05/20    [provider]  tamsulosin (FLOMAX) 0.4 MG CAPS capsule Take 0.4 mg by mouth daily.  02/06/19   [provider]  Flossie Buffy PEN NEEDLES 29G X 12.7MM [place]  11/02/19   [provider]  Valbenazine Tosylate (INGREZZA) 40 MG CAPS Take 40 mg by mouth daily.     [provider]  ziprasidone (GEODON) 80 MG capsule Take 80 mg by mouth 2 (two) times daily. 07/27/19   [provider]     Family History  Problem Relation Age of Onset  . Breast cancer Mother   . Throat cancer Brother   . Prostate cancer Neg Hx   . Colon cancer Neg Hx   . Pancreatic cancer Neg Hx     Social History   Socioeconomic History  . Marital status: Single    Spouse name: Not on file  . Number of children: 0  . Years of education: Not on file  . Highest education level: Not on file  Occupational History  . Not on file  Tobacco Use  . Smoking status: Current Every Day Smoker    Packs/day: 0.50    Years: 48.00    Pack years: 24.00    Types: Cigarettes  . Smokeless tobacco: Never Used  Vaping Use  . Vaping Use: Never used  Substance and Sexual Activity  . Alcohol use: Not Currently  . Drug use: Never  . Sexual activity: Not Currently  Other Topics Concern  . Not on file  Social History Narrative  . Not on file   Social Determinants of Health   Financial Resource Strain: Not on file  Food Insecurity: Not on file  Transportation Needs: Not on file  Physical Activity: Not on file  Stress: Not on file  Social Connections: Not on file    ECOG Status: 1 - Symptomatic but completely ambulatory  Review of Systems  Review of Systems: A 12 point ROS discussed and pertinent positives are indicated in the HPI above.  All other systems are negative.  Physical Exam No direct physical exam was performed (except for noted visual exam findings with Video Visits).   Vital Signs: There were no vitals taken for this visit.  Imaging:  Personal review of noncontrast CT  scan of the abdomen performed 12/15/2020 demonstrates development of 4 punctate (sub-1 cm) perinephric nodules as well as increased prominence of left-sided retroperitoneal lymph nodes, currently not enlarged by size criteria with dominant left-sided retroperitoneal lymph node measuring 0.7 cm in diameter though given development of additional perinephric nodules is worrisome for progression of metastatic disease.   CT ABDOMEN WO CONTRAST  Result Date: 12/15/2020 CLINICAL DATA:  Pre embolization planning. History of papillary renal cell carcinoma. EXAM: CT ABDOMEN WITHOUT CONTRAST TECHNIQUE: Multidetector CT imaging of the abdomen was performed following the standard protocol without IV contrast. COMPARISON:  MRI of October 10, 2020 FINDINGS: Lower chest: Lung bases are clear.  No effusion.  No consolidation. Hepatobiliary: Liver with smooth contours. Signs of hepatic cysts seen on recent MRI. Cholelithiasis. No pericholecystic stranding. Pancreas: Pancreas without signs of ductal dilation grossly or evidence of inflammation. Spleen: Spleen normal size and contour. Adrenals/Urinary Tract: Adrenal glands are normal. Cysts arising from the RIGHT kidney are stable grossly on this noncontrast assessment. Central area of  added density in the lower renal sinus fat following partial nephrectomy measures approximately 2 cm this is in the area of concern raised on the previous MRI. In the adjacent retroperitoneum there are soft tissue nodules measuring between 5 and 7 mm best seen on image 38 of series 2. On image 39 of series 2 there is a nodular density along postoperative plane measuring 10 mm Bulky para-aortic adenopathy in the LEFT retroperitoneum 2.5 cm short axis lymph node along the LEFT periaortic chain previously 2.0 cm. No hydronephrosis.  No perinephric stranding. Stomach/Bowel: Small hiatal hernia. No acute gastrointestinal process. Colonic diverticulosis. Bowel incompletely imaged on this abdominal CT.  Appendix is normal. Vascular/Lymphatic: Calcified atheromatous plaque in the abdominal aorta without aneurysmal dilation. Smooth contour of the IVC. Adenopathy in the retroperitoneum as described. Other: Nodular changes in the LEFT retroperitoneum. Also small nodules or lymph nodes present along the retroperitoneum or potentially within the sigmoid mesentery measuring between 5 and 6 mm seen on image 49 in 51 respectively with other small nodules or lymph nodes in this space. Most suspicious areas are along the lateral and posterior LEFT flank in the posterior pararenal space and along the transversalis fascia on image 38 described in the renal section. Musculoskeletal: No acute musculoskeletal process. No destructive bone finding. Spinal degenerative changes. IMPRESSION: 1. Nodular changes about the LEFT retroperitoneum in the surgical bed suspicious for areas of disease recurrence in the LEFT retroperitoneum and along the LEFT flank. 2. Signs of increased soft tissue density about the inferior LEFT renal sinus fat pole following partial nephrectomy again suspicious for recurrent disease locally. 3. Other scattered small nodules seen inferiorly along the plane of the sigmoid mesocolon not present on preoperative evaluations may serve as additional evidence of disease. 4. Cholelithiasis. 5. Colonic diverticulosis. 6. Small hiatal hernia. 7. Aortic atherosclerosis. These results will be called to the ordering clinician or representative by the Radiologist Assistant, and communication documented in the PACS or [employer]. Electronically Signed   By: Zetta Bills M.D.   On: 12/15/2020 14:24   IR Radiologist Eval & Mgmt  Result Date: 11/22/2020 Please refer to notes tab for details about interventional procedure. (Op Note)   Labs:  CBC: Recent Labs    11/28/20 1146  WBC 5.3  HGB 11.8*  HCT 34.6*  PLT 285    COAGS: No results for input(s): INR, APTT in the last 8760 hours.  BMP: Recent Labs     11/28/20 1146  NA 143  K 3.8  CL 108  CO2 26  GLUCOSE 107*  BUN 22  CALCIUM 8.9  CREATININE 2.95*  GFRNONAA 22*    LIVER FUNCTION TESTS: Recent Labs    11/28/20 1146  BILITOT 0.6  AST 14*  ALT 11  ALKPHOS 49  PROT 6.9  ALBUMIN 3.6    TUMOR MARKERS: No results for input(s): AFPTM, CEA, CA199, CHROMGRNA in the last 8760 hours.  Assessment and Plan:  KANAI BERRIOS is a 70 y.o. male with past medical history significant for chronic renal disease, diabetes, hypertension and schizoaffective disorder who underwent partial left-sided nephrectomy by Dr. Alinda Money on 08/10/2019.  Unfortunately, surveillance imaging demonstrated concern for local recurrence as well as local metastatic disease involving the left periodic lymph node and as such the patient has been referred to interventional radiology for potential percutaneous management, as Dr. Alinda Money has offered the patient definitive surgical resection as well as regional lymph node dissection however the patient has refused given his chronic renal insufficiency and  potential for requiring dialysis after definitive nephrectomy.   Personal review of noncontrast CT scan of the abdomen performed 12/15/2020 demonstrates development of 4 punctate (sub-1 cm) perinephric nodules as well as increased prominence of left-sided retroperitoneal lymph nodes, currently not enlarged by size criteria with dominant left-sided retroperitoneal lymph node measuring 0.7 cm in diameter though given development of additional perinephric nodules is worrisome for progression of metastatic disease.  Given suspected progression of metastatic disease, I do NOT feel that the patient would benefit from directed renal cryoablation and based on my discussion with the patient today he is NOT interested in undergoing additional procedures at this time regardless.  In effort to help facilitate the patient's work-up, Riley Stuart will proceed with obtaining a noncontrast CT  scan of the chest, abdomen and pelvis for definitive staging purposes.  Again, intravenous contrast is unable to be administered given patient's baseline renal insufficiency.  Above was discussed with referring urologist, Dr. Alinda Money, and Oncologist Dr. Tasia Catchings.  PLAN: - Obtain a noncontrast CT scan of the chest, abdomen and pelvis (examination to be performed at [facility]) - Return to Dr. Tasia Catchings for discussion of potential systemic treatment options.   A copy of this report was sent to the requesting provider on this date.  Electronically Signed: Sandi Mariscal 12/21/2020, 8:19 AM   Riley Stuart spent a total of 15 Minutes in remote  clinical consultation, greater than 50% of which was counseling/coordinating care for metastatic renal cell carcinoma.    Visit type: Audio only (telephone). Audio (no video) only due to patient's lack of internet/smartphone capability. Alternative for in-person consultation at [facility], [address], [place], [place]. This visit type was conducted due to national recommendations for restrictions regarding the COVID-19 Pandemic (e.g. social distancing).  This format is felt to be most appropriate for this patient at this time.  All issues noted in this document were discussed and addressed.

## 2020-12-29 ENCOUNTER — Other Ambulatory Visit: Payer: Self-pay

## 2020-12-29 ENCOUNTER — Ambulatory Visit
Admission: RE | Admit: 2020-12-29 | Discharge: 2020-12-29 | Disposition: A | Discharge status: Home / Self Care | Payer: Medicare Other | Source: Ambulatory Visit | Attending: Interventional Radiology | Admitting: Interventional Radiology

## 2020-12-29 DIAGNOSIS — N2889 Other specified disorders of kidney and ureter: Secondary | ICD-10-CM | POA: Diagnosis not present

## 2020-12-29 DIAGNOSIS — C642 Malignant neoplasm of left kidney, except renal pelvis: Secondary | ICD-10-CM | POA: Diagnosis not present

## 2020-12-29 DIAGNOSIS — I700 Atherosclerosis of aorta: Secondary | ICD-10-CM | POA: Insufficient documentation

## 2020-12-29 DIAGNOSIS — M47814 Spondylosis without myelopathy or radiculopathy, thoracic region: Secondary | ICD-10-CM | POA: Diagnosis not present

## 2020-12-29 DIAGNOSIS — J432 Centrilobular emphysema: Secondary | ICD-10-CM | POA: Diagnosis not present

## 2020-12-29 DIAGNOSIS — K5730 Diverticulosis of large intestine without perforation or abscess without bleeding: Secondary | ICD-10-CM | POA: Insufficient documentation

## 2020-12-29 DIAGNOSIS — D49512 Neoplasm of unspecified behavior of left kidney: Secondary | ICD-10-CM | POA: Diagnosis not present

## 2020-12-29 DIAGNOSIS — J439 Emphysema, unspecified: Secondary | ICD-10-CM | POA: Insufficient documentation

## 2020-12-29 DIAGNOSIS — C649 Malignant neoplasm of unspecified kidney, except renal pelvis: Secondary | ICD-10-CM | POA: Diagnosis not present

## 2020-12-29 DIAGNOSIS — I2510 Atherosclerotic heart disease of native coronary artery without angina pectoris: Secondary | ICD-10-CM | POA: Diagnosis not present

## 2020-12-29 DIAGNOSIS — K8080 Other cholelithiasis without obstruction: Secondary | ICD-10-CM | POA: Diagnosis not present

## 2020-12-29 DIAGNOSIS — C772 Secondary and unspecified malignant neoplasm of intra-abdominal lymph nodes: Secondary | ICD-10-CM | POA: Diagnosis not present

## 2021-01-04 NOTE — Progress Notes (Signed)
Patient  Is aware that this is a phone visit. Patient denies any pain or dysuria. Patient denies any hematuria. Patient reports mild fatigue. Patient denies any urgency with urination Patient states that he empties his bladder with urination. Patient denies any issues with his bowels.Pateint denies any n/v. Patient states that he has an appointment with his urologist next Friday.

## 2021-01-06 ENCOUNTER — Telehealth: Payer: Self-pay | Admitting: Pharmacist

## 2021-01-06 ENCOUNTER — Inpatient Hospital Stay: Payer: Medicare Other | Attending: Oncology | Admitting: Oncology

## 2021-01-06 ENCOUNTER — Encounter: Payer: Self-pay | Admitting: Oncology

## 2021-01-06 ENCOUNTER — Inpatient Hospital Stay: Payer: Medicare Other

## 2021-01-06 VITALS — BP 145/82 | HR 54 | Temp 97.1°F | Resp 16 | Wt 234.9 lb

## 2021-01-06 DIAGNOSIS — E1122 Type 2 diabetes mellitus with diabetic chronic kidney disease: Secondary | ICD-10-CM | POA: Diagnosis not present

## 2021-01-06 DIAGNOSIS — N189 Chronic kidney disease, unspecified: Secondary | ICD-10-CM | POA: Diagnosis not present

## 2021-01-06 DIAGNOSIS — D631 Anemia in chronic kidney disease: Secondary | ICD-10-CM | POA: Diagnosis not present

## 2021-01-06 DIAGNOSIS — C642 Malignant neoplasm of left kidney, except renal pelvis: Secondary | ICD-10-CM | POA: Diagnosis not present

## 2021-01-06 DIAGNOSIS — Z7189 Other specified counseling: Secondary | ICD-10-CM

## 2021-01-06 DIAGNOSIS — I129 Hypertensive chronic kidney disease with stage 1 through stage 4 chronic kidney disease, or unspecified chronic kidney disease: Secondary | ICD-10-CM | POA: Insufficient documentation

## 2021-01-06 LAB — COMPREHENSIVE METABOLIC PANEL
ALT: 11 U/L (ref 0–44)
AST: 16 U/L (ref 15–41)
Albumin: 3.7 g/dL (ref 3.5–5.0)
Alkaline Phosphatase: 54 U/L (ref 38–126)
Anion gap: 9 (ref 5–15)
BUN: 29 mg/dL — ABNORMAL HIGH (ref 8–23)
CO2: 24 mmol/L (ref 22–32)
Calcium: 8.8 mg/dL — ABNORMAL LOW (ref 8.9–10.3)
Chloride: 110 mmol/L (ref 98–111)
Creatinine, Ser: 3.15 mg/dL — ABNORMAL HIGH (ref 0.61–1.24)
GFR, Estimated: 20 mL/min — ABNORMAL LOW (ref >60)
Glucose, Bld: 97 mg/dL (ref 70–99)
Potassium: 3.7 mmol/L (ref 3.5–5.1)
Sodium: 143 mmol/L (ref 135–145)
Total Bilirubin: 0.5 mg/dL (ref 0.3–1.2)
Total Protein: 6.8 g/dL (ref 6.5–8.1)

## 2021-01-06 LAB — CBC WITH DIFFERENTIAL/PLATELET
Abs Immature Granulocytes: 0.02 10*3/uL (ref 0.00–0.07)
Basophils Absolute: 0 10*3/uL (ref 0.0–0.1)
Basophils Relative: 0 %
Eosinophils Absolute: 0 10*3/uL (ref 0.0–0.5)
Eosinophils Relative: 0 %
HCT: 32.9 % — ABNORMAL LOW (ref 39.0–52.0)
Hemoglobin: 11.2 g/dL — ABNORMAL LOW (ref 13.0–17.0)
Immature Granulocytes: 0 %
Lymphocytes Relative: 18 %
Lymphs Abs: 0.8 10*3/uL (ref 0.7–4.0)
MCH: 31 pg (ref 26.0–34.0)
MCHC: 34 g/dL (ref 30.0–36.0)
MCV: 91.1 fL (ref 80.0–100.0)
Monocytes Absolute: 0.4 10*3/uL (ref 0.1–1.0)
Monocytes Relative: 8 %
Neutro Abs: 3.4 10*3/uL (ref 1.7–7.7)
Neutrophils Relative %: 74 %
Platelets: 232 10*3/uL (ref 150–400)
RBC: 3.61 MIL/uL — ABNORMAL LOW (ref 4.22–5.81)
RDW: 15.8 % — ABNORMAL HIGH (ref 11.5–15.5)
WBC: 4.7 10*3/uL (ref 4.0–10.5)
nRBC: 0 % (ref 0.0–0.2)

## 2021-01-06 MED ORDER — CABOZANTINIB S-MALATE 40 MG PO TABS: 40.0000 mg | ORAL_TABLET | Freq: Every day | ORAL | 1 refills | Status: DC

## 2021-01-06 NOTE — Telephone Encounter (Signed)
Oral Chemotherapy Pharmacist Encounter   Order for Cabometyx administration faxed to [facility]. (fax: [phone]). Spoke with Vaughan Basta, RN prior to sending the fax.   Darl Pikes, PharmD, BCPS, BCOP, CPP Hematology/Oncology Clinical Pharmacist [facility] [phone]  01/09/2021 4:11 PM

## 2021-01-06 NOTE — Telephone Encounter (Signed)
Oral Oncology Pharmacist Encounter  Received new prescription for Cabometyx (cabozantinib) for the treatment of recurrent left papillary renal cell carcinoma, planned duration until disease progression or unacceptable drug toxicity. Patient has declined surgery.   CMP from 01/06/21 assessed, no relevant lab abnormalities. Prescription dose and frequency assessed.   Current medication list in Epic reviewed, no relevant DDIs with cabozantinib identified.  Evaluated chart and no patient barriers to medication adherence identified.   Prescription has been e-scribed to the [name] for benefits analysis and approval.  Oral Oncology Clinic will continue to follow for insurance authorization, copayment issues, initial counseling and start date.  Patient agreed to treatment on 01/06/21 per MD documentation.  Darl Pikes, PharmD, BCPS, BCOP, CPP Hematology/Oncology Clinical Pharmacist Practitioner [facility] [phone]  01/06/2021 3:56 PM

## 2021-01-06 NOTE — Progress Notes (Signed)
Hematology/Oncology Consult note [facility] Telephone:([phone] [phone]   Patient Care Team: Jodi Marble, MD as PCP - General (Internal Medicine) Marden Noble, MD (Internal Medicine) Tyler Pita, MD as Consulting Physician (Radiation Oncology)  REFERRING PROVIDER: Jodi Marble, MD  CHIEF COMPLAINTS/REASON FOR VISIT:  Follow-up for treatment of recurrent RCC  HISTORY OF PRESENTING ILLNESS:   Riley Stuart is a  70 y.o.  male with PMH listed below was seen in consultation at the request of  Jodi Marble, MD  for evaluation of [place] Patient has schizoaffective disorder and lives in the group home.  He is quite functional at baseline and makes his own medical decision. 11/27/2018 CT abdomen pelvis without contrast showed solid-appearing mass of the lower pole of the left kidney measuring up to 6.8 cm concerning for RCC. 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 08/10/2019, patient underwent partial left nephrectomy and results showed papillary renal cell carcinoma, type II, nuclear grade 3.  Size 9 cm.  Tumor invades perirenal fat (pT3a). Patient went image surveillance. 10/10/2020 , MRI abdomen with and without contrast showed new 2.4 x 1.8 cm faintly enhancing nodularity along the left kidney lower pole partial nephrectomy sites.  Suspicious for local recurrence.  New pathologically enlarged left periaortic lymph node, highly suspicious for malignancy in this context.  Numerous bilateral renal cysts.:  Diverticulosis lumbar spondylosis and degenerative disease 1.4 gallstone in the gallbladder.  Per note, urology Dr. Alinda Money has offered patient definitive surgical resection as well as regional lymph node dissection however patient declined the surgery due to his chronic kidney insufficiency and potential need of requiring dialysis after radical nephrectomy.  12/09/2020 status post SBRT to the  periaortic lymph node radiation oncology Dr. Tammi Klippel .  He was also seen by Dr. Pascal Lux for possible cryoablation.  Dr. Pascal Lux recommend repeat CT abdomen which has been ordered.  INTERVAL HISTORY Riley Stuart is a 70 y.o. male who has above history reviewed by me today presents for follow up visit for management of recurrent RCC Problems and complaints are listed below: 12/15/2020, CT abdomen without contrast showed nodular changes of the left retroperitoneum in the surgical bed suspicious for areas of disease recurrence in the left retroperitoneum and along the left flank.  Signs of increased soft tissue density about the inferior left renal sinus fat bowel following partial nephrectomy. Other scattered small nodules seen inferiorly along the plane of the sigmoid mesocolon not present on preoperative evaluations may serve as additional evidence of disease.    # Initially there was plan for cryoablation.  Patient was evaluated by IR Dr. Pascal Lux, however due to the disease progression on recent CT, Dr. Pascal Lux does not feel that patient will benefit from cryoablation at this point. Patient presents today to discuss systemic treatment options.  Patient is a poor historian.  He denies any pain at this point.  No dysuria, hematuria .  He was accompanied by group home staff.  Review of Systems  Constitutional: Negative for appetite change, chills, fatigue, fever and unexpected weight change.  HENT:   Negative for hearing loss and voice change.   Eyes: Negative for eye problems and icterus.  Respiratory: Negative for chest tightness, cough and shortness of breath.   Cardiovascular: Negative for chest pain and leg swelling.  Gastrointestinal: Negative for abdominal distention and abdominal pain.  Endocrine: Negative for hot flashes.  Genitourinary: Negative for difficulty urinating, dysuria and frequency.   Musculoskeletal: Negative for  arthralgias.  Skin: Negative for itching and rash.   Neurological: Negative for light-headedness and numbness.  Hematological: Negative for adenopathy. Does not bruise/bleed easily.  Psychiatric/Behavioral: Negative for confusion.    MEDICAL HISTORY:  Past Medical History:  Diagnosis Date  . Chronic kidney disease    renal insufficiency  . Diabetes mellitus without complication ([name])    Pt takes Insulin  . Hypertension   . Mental disorder    schizoaffective  . Renal cell carcinoma ([place])     SURGICAL HISTORY: Past Surgical History:  Procedure Laterality Date  . ANKLE FRACTURE SURGERY    . COLONOSCOPY WITH PROPOFOL N/A 04/15/2020   Procedure: COLONOSCOPY WITH PROPOFOL;  Surgeon: Virgel Manifold, MD;  Location: [facility] ENDOSCOPY;  Service: Endoscopy;  Laterality: N/A;  . CRANIOTOMY Left 10/27/2013   Procedure: Craniotomy for Aneurysm Clipping;  Surgeon: Consuella Lose, MD;  Location: [place] NEURO ORS;  Service: Neurosurgery;  Laterality: Left;  . IR RADIOLOGIST EVAL & MGMT  11/22/2020  . IR RADIOLOGIST EVAL & MGMT  12/21/2020  . KNEE SURGERY     due to fracture.   . ROBOTIC ASSITED PARTIAL NEPHRECTOMY Left 08/10/2019   Procedure: XI ROBOTIC ASSITED LAPAROSCOPIC  PARTIAL NEPHRECTOMY;  Surgeon: Raynelle Bring, MD;  Location: [facility] ORS;  Service: Urology;  Laterality: Left;    SOCIAL HISTORY: Social History   Socioeconomic History  . Marital status: Single    Spouse name: Not on file  . Number of children: 0  . Years of education: Not on file  . Highest education level: Not on file  Occupational History  . Not on file  Tobacco Use  . Smoking status: Current Every Day Smoker    Packs/day: 0.50    Years: 48.00    Pack years: 24.00    Types: Cigarettes  . Smokeless tobacco: Never Used  Vaping Use  . Vaping Use: Never used  Substance and Sexual Activity  . Alcohol use: Not Currently  . Drug use: Never  . Sexual activity: Not Currently  Other Topics Concern  . Not on file  Social History Narrative  . Not on file   Social  Determinants of Health   Financial Resource Strain: Not on file  Food Insecurity: Not on file  Transportation Needs: Not on file  Physical Activity: Not on file  Stress: Not on file  Social Connections: Not on file  Intimate Partner Violence: Not on file    FAMILY HISTORY: Family History  Problem Relation Age of Onset  . Breast cancer Mother   . Throat cancer Brother   . Prostate cancer Neg Hx   . Colon cancer Neg Hx   . Pancreatic cancer Neg Hx     ALLERGIES:  has No Known Allergies.  MEDICATIONS:  Current Outpatient Medications  Medication Sig Dispense Refill  . acetaminophen (TYLENOL) 650 MG CR tablet Take by mouth.    Marland Kitchen amLODipine (NORVASC) 10 MG tablet Take 10 mg by mouth daily.    Marland Kitchen atorvastatin (LIPITOR) 10 MG tablet Take 1 tablet by mouth daily.    . cabozantinib (CABOMETYX) 40 MG tablet Take 1 tablet (40 mg total) by mouth daily. Take on an empty stomach, 1 hour before or 2 hours after meals. 30 tablet 1  . carvedilol (COREG) 25 MG tablet Take 25 mg by mouth 2 (two) times daily.    . fenofibrate (TRICOR) 145 MG tablet Take 145 mg by mouth daily.    . furosemide (LASIX) 20 MG tablet Take 10 mg by  mouth daily.    Marland Kitchen lamoTRIgine (LAMICTAL) 100 MG tablet Take 100 mg by mouth 2 (two) times daily.    Marland Kitchen NOVOLOG MIX 70/30 FLEXPEN (70-30) 100 UNIT/ML FlexPen Inject 30 Units as directed daily.    Marland Kitchen omeprazole (PRILOSEC) 40 MG capsule Take 40 mg by mouth daily.    . tamsulosin (FLOMAX) 0.4 MG CAPS capsule Take 0.4 mg by mouth daily.     Marland Kitchen ULTICARE PEN NEEDLES 29G X 12.7MM MISC     . Valbenazine Tosylate (INGREZZA) 40 MG CAPS Take 40 mg by mouth daily.     . valsartan (DIOVAN) 160 MG tablet Take 160 mg by mouth daily.    . ziprasidone (GEODON) 80 MG capsule Take 80 mg by mouth 2 (two) times daily.     No current facility-administered medications for this visit.     PHYSICAL EXAMINATION: ECOG PERFORMANCE STATUS: 1 - Symptomatic but completely ambulatory Vitals:   01/06/21  0911  BP: (!) 145/82  Pulse: (!) 54  Resp: 16  Temp: (!) 97.1 F (36.2 C)  SpO2: 100%   Filed Weights   01/06/21 0911  Weight: 234 lb 14.4 oz (106.5 kg)    Physical Exam Constitutional:      General: He is not in acute distress. HENT:     Head: Normocephalic and atraumatic.  Eyes:     General: No scleral icterus. Cardiovascular:     Rate and Rhythm: Normal rate and regular rhythm.     Heart sounds: Normal heart sounds.  Pulmonary:     Effort: Pulmonary effort is normal. No respiratory distress.     Breath sounds: No wheezing.  Abdominal:     General: Bowel sounds are normal. There is no distension.     Palpations: Abdomen is soft.  Musculoskeletal:        General: No deformity. Normal range of motion.     Cervical back: Normal range of motion and neck supple.  Skin:    General: Skin is warm and dry.     Findings: No erythema or rash.  Neurological:     Mental Status: He is alert and oriented to person, place, and time. Mental status is at baseline.     Comments: Patient talks slowly  Psychiatric:        Mood and Affect: Mood normal.     LABORATORY DATA:  I have reviewed the data as listed Lab Results  Component Value Date   WBC 4.7 01/06/2021   HGB 11.2 (L) 01/06/2021   HCT 32.9 (L) 01/06/2021   MCV 91.1 01/06/2021   PLT 232 01/06/2021   Recent Labs    11/28/20 1146 01/06/21 0959  NA 143 143  K 3.8 3.7  CL 108 110  CO2 26 24  GLUCOSE 107* 97  BUN 22 29*  CREATININE 2.95* 3.15*  CALCIUM 8.9 8.8*  GFRNONAA 22* 20*  PROT 6.9 6.8  ALBUMIN 3.6 3.7  AST 14* 16  ALT 11 11  ALKPHOS 49 54  BILITOT 0.6 0.5   Iron/TIBC/Ferritin/ %Sat No results found for: IRON, TIBC, FERRITIN, IRONPCTSAT    RADIOGRAPHIC STUDIES: I have personally reviewed the radiological images as listed and agreed with the findings in the report. CT ABDOMEN WO CONTRAST  Result Date: 12/15/2020 CLINICAL DATA:  Pre embolization planning. History of papillary renal cell carcinoma.  EXAM: CT ABDOMEN WITHOUT CONTRAST TECHNIQUE: Multidetector CT imaging of the abdomen was performed following the standard protocol without IV contrast. COMPARISON:  MRI of October 10, 2020  FINDINGS: Lower chest: Lung bases are clear.  No effusion.  No consolidation. Hepatobiliary: Liver with smooth contours. Signs of hepatic cysts seen on recent MRI. Cholelithiasis. No pericholecystic stranding. Pancreas: Pancreas without signs of ductal dilation grossly or evidence of inflammation. Spleen: Spleen normal size and contour. Adrenals/Urinary Tract: Adrenal glands are normal. Cysts arising from the RIGHT kidney are stable grossly on this noncontrast assessment. Central area of added density in the lower renal sinus fat following partial nephrectomy measures approximately 2 cm this is in the area of concern raised on the previous MRI. In the adjacent retroperitoneum there are soft tissue nodules measuring between 5 and 7 mm best seen on image 38 of series 2. On image 39 of series 2 there is a nodular density along postoperative plane measuring 10 mm Bulky para-aortic adenopathy in the LEFT retroperitoneum 2.5 cm short axis lymph node along the LEFT periaortic chain previously 2.0 cm. No hydronephrosis.  No perinephric stranding. Stomach/Bowel: Small hiatal hernia. No acute gastrointestinal process. Colonic diverticulosis. Bowel incompletely imaged on this abdominal CT. Appendix is normal. Vascular/Lymphatic: Calcified atheromatous plaque in the abdominal aorta without aneurysmal dilation. Smooth contour of the IVC. Adenopathy in the retroperitoneum as described. Other: Nodular changes in the LEFT retroperitoneum. Also small nodules or lymph nodes present along the retroperitoneum or potentially within the sigmoid mesentery measuring between 5 and 6 mm seen on image 49 in 51 respectively with other small nodules or lymph nodes in this space. Most suspicious areas are along the lateral and posterior LEFT flank in the  posterior pararenal space and along the transversalis fascia on image 38 described in the renal section. Musculoskeletal: No acute musculoskeletal process. No destructive bone finding. Spinal degenerative changes. IMPRESSION: 1. Nodular changes about the LEFT retroperitoneum in the surgical bed suspicious for areas of disease recurrence in the LEFT retroperitoneum and along the LEFT flank. 2. Signs of increased soft tissue density about the inferior LEFT renal sinus fat pole following partial nephrectomy again suspicious for recurrent disease locally. 3. Other scattered small nodules seen inferiorly along the plane of the sigmoid mesocolon not present on preoperative evaluations may serve as additional evidence of disease. 4. Cholelithiasis. 5. Colonic diverticulosis. 6. Small hiatal hernia. 7. Aortic atherosclerosis. These results will be called to the ordering clinician or representative by the Radiologist Assistant, and communication documented in the PACS or [employer]. Electronically Signed   By: Zetta Bills M.D.   On: 12/15/2020 14:24   CT CHEST ABDOMEN PELVIS WO CONTRAST  Result Date: 12/31/2020 CLINICAL DATA:  Partial left nephrectomy 08/10/2019 for renal cell carcinoma. Suspected local recurrence and metastatic disease on recent imaging. Restaging. EXAM: CT CHEST, ABDOMEN AND PELVIS WITHOUT CONTRAST TECHNIQUE: Multidetector CT imaging of the chest, abdomen and pelvis was performed following the standard protocol without IV contrast. COMPARISON:  12/15/2020 unenhanced CT abdomen. 10/10/2020 MRI abdomen. 09/26/2020 chest CT. FINDINGS: CT CHEST FINDINGS Cardiovascular: Normal heart size. No significant pericardial effusion/thickening. Three-vessel coronary atherosclerosis. Atherosclerotic nonaneurysmal thoracic aorta. Normal caliber pulmonary arteries. Mediastinum/Nodes: Stable hypodense 1.9 cm anterior right thyroid nodule. Unremarkable esophagus. No pathologically enlarged axillary, mediastinal  or hilar lymph nodes, noting limited sensitivity for the detection of hilar adenopathy on this noncontrast study. Lungs/Pleura: No pneumothorax. No pleural effusion. Moderate to severe centrilobular and paraseptal emphysema with diffuse bronchial wall thickening. Patchy clustered solid lobulated pulmonary nodules in posterior right lower lobe, largest 1.2 cm (series 4/image 76), new. No acute consolidative airspace disease or lung masses. Two right middle lobe tiny pulmonary  nodules, largest 0.3 cm (series 4/image 73), both stable. Musculoskeletal: No aggressive appearing focal osseous lesions. Moderate thoracic spondylosis. CT ABDOMEN PELVIS FINDINGS Hepatobiliary: Normal liver size. A few scattered tiny subcentimeter hypodense liver lesions are too small to characterize, are unchanged from prior MRI studies, where they were characterized as benign liver cysts. No appreciable new liver lesions. Cholelithiasis. No biliary ductal dilatation. Pancreas: Normal, with no mass or duct dilation. Spleen: Normal size. No mass. Adrenals/Urinary Tract: No discrete adrenal nodules. No renal stones. No hydronephrosis. Solid 2.7 x 2.1 cm mass in the lower left kidney partial nephrectomy bed (series 2/image 73), previously 2.6 x 1.8 cm on 12/15/2020 CT, slightly increased. Several simple renal cysts scattered in both kidneys, largest 3.6 cm in the lower right kidney. Scattered small soft tissue nodules throughout the left retroperitoneum as previously detailed, not substantially changed from recent unenhanced CT, for example measuring 1.0 cm near the descending colon (series 2/image 74), 0.8 cm in the lateral fat (series 2/image 73) and 0.7 cm more inferiorly (series 2/image 88). Normal bladder. Stomach/Bowel: Normal non-distended stomach. Normal caliber small bowel with no small bowel wall thickening. Normal appendix. Moderate diffuse colonic diverticulosis with no large bowel wall thickening or significant pericolonic fat  stranding. Vascular/Lymphatic: Atherosclerotic nonaneurysmal abdominal aorta. Enlarged 2.7 cm short axis diameter left periaortic node (series 2/image 72), slightly increased from 2.6 cm. No additional sites of adenopathy. Reproductive: Normal size prostate. Other: No pneumoperitoneum, ascites or focal fluid collection. Musculoskeletal: No aggressive appearing focal osseous lesions. Marked lumbar spondylosis. IMPRESSION: 1. Enlarging locally recurrent solid 2.7 cm neoplasm in the lower left kidney partial nephrectomy bed. 2. Enlarging left para-aortic nodal metastasis. 3. Scattered small soft tissue nodules throughout the left retroperitoneum are not substantially changed from recent unenhanced CT studies, suspicious for scattered left retroperitoneal metastases. 4. No findings highly suspicious for metastatic disease in the chest. 5. Patchy clustered solid lobulated pulmonary nodules in the posterior right lower lobe, largest 1.2 cm, new, indeterminate, favor infectious or inflammatory etiology. Two tiny right middle lobe nodules up to 0.3 cm are unchanged. Follow-up chest CT suggested in 3 months. 6. Moderate colonic diverticulosis. 7. Three-vessel coronary atherosclerosis. 8. Aortic Atherosclerosis (ICD10-I70.0) and Emphysema (ICD10-J43.9). Electronically Signed   By: Ilona Sorrel M.D.   On: 12/31/2020 09:49   IR Radiologist Eval & Mgmt  Result Date: 12/21/2020 Please refer to notes tab for details about interventional procedure. (Op Note)     ASSESSMENT & PLAN:  1. Cancer of kidney, left [facility])   Cancer Staging Cancer of kidney, left [facility]) Staging form: Kidney, AJCC 8th Edition - Clinical stage from 11/28/2020: Stage III (rcT1a, cN1, cM0) - Signed by Earlie Server, MD on 11/28/2020   Recurrent left papillary renal cell carcinoma, with nodal involvement. Patient has had SBRT to the periaortic nodes. 12/15/2020 CT scan images were reviewed and discussed with patient. Likely local disease  progression. Patient has previously discussed with Dr. Alinda Money and declined definitive surgery. I confirmed with him today and patient adamantly does not want to proceed with surgery because of concern of becoming dialysis dependent after surgery.  He understands that surgery offers him the maximum chance of disease control. Goals of systemic chemotherapy is with palliative intent, discussed.  Discussed about options of immunotherapy with nivolumab/ipilimumab versus cabozantinib treatments. Patient is not interested in IV infusions and prefers to take cabozantinib.  Rationale and potential side effects were discussed with him.  He agrees with the plan.  Patient will have chemotherapy education with oral  chemotherapy pharmacist.  We will look into his coverage is. Check CBC, CMP Patient will see me 1 week after he starts cabozantinib.   Orders Placed This Encounter  Procedures  . CBC with Differential/Platelet    Standing Status:   Future    Number of Occurrences:   1    Standing Expiration Date:   01/06/2022  . Comprehensive metabolic panel    Standing Status:   Future    Number of Occurrences:   1    Standing Expiration Date:   01/06/2022    All questions were answered. The patient knows to call the clinic with any problems questions or concerns.  cc Jodi Marble, MD    Return of visit: 1 week after he starts treatment   Earlie Server, MD, PhD Hematology Oncology [facility] Pager- [phone] 01/06/2021

## 2021-01-06 NOTE — Progress Notes (Signed)
START OFF PATHWAY REGIMEN - Renal Cell   [id number]:Cabozantinib (Cabometyx) 60 mg PO Daily D1-28 q28 Days:   A cycle is every 28 days:     Cabozantinib (tablet)   **Always confirm dose/schedule in your pharmacy ordering system**  Patient Characteristics: Preoperative or Nonsurgical Candidate (Clinical Staging), Stage I, II, III Therapeutic Status: Preoperative or Nonsurgical Candidate (Clinical Staging) AJCC M Category: cM0 AJCC 8 Stage Grouping: III AJCC T Category: cT1 AJCC N Category: cN1 Intent of Therapy: Non-Curative / Palliative Intent, Discussed with Patient

## 2021-01-06 NOTE — Progress Notes (Signed)
Patient has an increase in fatigue. 

## 2021-01-09 ENCOUNTER — Other Ambulatory Visit (HOSPITAL_COMMUNITY): Payer: Self-pay

## 2021-01-09 ENCOUNTER — Telehealth: Payer: Self-pay | Admitting: Pharmacy Technician

## 2021-01-09 ENCOUNTER — Other Ambulatory Visit: Payer: Self-pay

## 2021-01-09 DIAGNOSIS — C642 Malignant neoplasm of left kidney, except renal pelvis: Secondary | ICD-10-CM

## 2021-01-09 MED ORDER — ONDANSETRON HCL 8 MG PO TABS: 8.0000 mg | ORAL_TABLET | Freq: Three times a day (TID) | ORAL | 0 refills | Status: DC | PRN

## 2021-01-09 MED ORDER — CABOZANTINIB S-MALATE 40 MG PO TABS
40.0000 mg | ORAL_TABLET | Freq: Every day | ORAL | 1 refills | Status: DC
  Filled 2021-01-09 (×2): qty 30, 30d supply, fill #0

## 2021-01-09 MED ORDER — CABOZANTINIB S-MALATE 40 MG PO TABS: 40.0000 mg | ORAL_TABLET | Freq: Every day | ORAL | 1 refills | Status: DC

## 2021-01-09 NOTE — Telephone Encounter (Signed)
Oral Oncology Patient Advocate Encounter  I spoke with Mr Riley Stuart and caregiver, Riley Stuart, this afternoon to set up delivery of Cabometyx.  Address verified for shipment.  Cabometyx will be filled through [facility] and mailed 01/10/21 for delivery 01/11/21.    [place] will call 7-10 days before next refill is due to complete adherence call and set up delivery of medication.     [place] Patient [place] Phone [phone] Fax [phone] 01/09/2021 3:56 PM

## 2021-01-09 NOTE — Telephone Encounter (Signed)
Oral Oncology Patient Advocate Encounter  Prior Authorization for Cabometyx has been approved.    PA# [id number] Effective dates: 01/09/21 through 08/26/21  Patients co-pay is $0.00  Oral Oncology Clinic will continue to follow.   [place] Patient [place] Phone [phone] Fax [phone] 01/09/2021 12:06 PM

## 2021-01-09 NOTE — Telephone Encounter (Signed)
Oral Oncology Patient Advocate Encounter  Received notification from [employer] that prior authorization for Cabometyx is required.  PA submitted on CoverMyMeds Key [id number] Status is pending  Oral Oncology Clinic will continue to follow.   [place] Patient [place] Phone [phone] Fax [phone] 01/09/2021 11:27 AM

## 2021-01-09 NOTE — Telephone Encounter (Signed)
Oral Chemotherapy Pharmacist Encounter  Patient Education I spoke with patient and Riley Stuart, Therapist, sports (a nurse at [place]) for overview of new oral chemotherapy medication:Cabometyx (cabozantinib) for the treatment of recurrent left papillary renal cell carcinoma, planned duration until disease progression or unacceptable drug toxicity. Patient has declined surgery. Planned start 01/12/21.  Counseled patient on administration, dosing, side effects, monitoring, drug-food interactions, safe handling, storage, and disposal.  [facility] will be administering the medication to Riley Stuart. Patient will take 1 tablet (40 mg total) by mouth daily. Take on an empty stomach, 1 hour before or 2 hours after meals.  Side effects include but not limited to: diarrhea, hand-foot syndrome, nausea, mouth sores, fatigue, HTN.    Nausea: Prescription for ondansetron sent to [place] used by the group home Diarrhea: Riley Stuart stated that they could give him lopermide as needed with out a direct order HTN: [facility] checks blood pressure twice daily Hand-foot syndrome: encouraged patient to keep his hand and feet moisturized  Reviewed with patient importance of keeping a medication schedule and plan for any missed doses.  After discussion with patient no patient barriers to medication adherence identified.   Riley Stuart voiced understanding and appreciation. All questions answered. Medication handout provided.  Provided patient with [facility] phone number. Patient knows to call the office with questions or concerns. Oral Chemotherapy Navigation Clinic will continue to follow.  Darl Pikes, PharmD, BCPS, BCOP, CPP Hematology/Oncology Clinical Pharmacist Practitioner [facility] [phone]  01/09/2021 4:21 PM

## 2021-01-10 ENCOUNTER — Other Ambulatory Visit (HOSPITAL_COMMUNITY): Payer: Self-pay

## 2021-01-10 MED ORDER — CABOZANTINIB S-MALATE 40 MG PO TABS
40.0000 mg | ORAL_TABLET | Freq: Every day | ORAL | 1 refills | Status: DC
  Filled 2021-01-10: qty 30, 30d supply, fill #0
  Filled 2021-02-01: qty 30, 30d supply, fill #1

## 2021-01-11 ENCOUNTER — Telehealth: Payer: Self-pay | Admitting: Podiatry

## 2021-01-11 ENCOUNTER — Other Ambulatory Visit: Payer: Self-pay

## 2021-01-11 ENCOUNTER — Ambulatory Visit
Admission: RE | Admit: 2021-01-11 | Discharge: 2021-01-11 | Disposition: A | Discharge status: Home / Self Care | Payer: Medicare Other | Source: Ambulatory Visit | Attending: Urology | Admitting: Urology

## 2021-01-11 ENCOUNTER — Other Ambulatory Visit (HOSPITAL_COMMUNITY): Payer: Self-pay

## 2021-01-11 DIAGNOSIS — C649 Malignant neoplasm of unspecified kidney, except renal pelvis: Secondary | ICD-10-CM | POA: Insufficient documentation

## 2021-01-11 DIAGNOSIS — C642 Malignant neoplasm of left kidney, except renal pelvis: Secondary | ICD-10-CM

## 2021-01-11 DIAGNOSIS — C779 Secondary and unspecified malignant neoplasm of lymph node, unspecified: Secondary | ICD-10-CM | POA: Insufficient documentation

## 2021-01-11 NOTE — Telephone Encounter (Signed)
Pt called checking the status of his diabetic shoes.  Upon checking we did not receive the documents needed until yesterday and the inserts are in production. I told pt it usually takes about 2 wks to get them and I would call when they come in to schedule an appt.

## 2021-01-11 NOTE — Progress Notes (Signed)
Radiation Oncology         [phone] ________________________________  Name: Riley Stuart MRN: [id number]  Date: 01/11/2021  DOB: 06/07/1951  Post Treatment Note  CC: Jodi Marble, MD  Jodi Marble, MD  Diagnosis:   70 yo man with an isolated oligometastatic 2.2 cm left para aortic lymph node from renal cell cancer  Interval Since Last Radiation:  4.5 weeks  11/29/20 - 12/09/20:   The left para aortic lymph node target was treated to 50Gy in 11factions of 10 Gy  Narrative:  I spoke with the patient to conduct his routine scheduled 1 month follow up visit via telephone to spare the patient unnecessary potential exposure in the healthcare setting during the current COVID-19 pandemic.  The patient was notified in advance and gave permission to proceed with this visit format.  He tolerated radiation treatment relatively well without any ill side effects.  He specifically denied any abdominal pain, nausea, vomiting, diarrhea or constipation.  He did not experience any significant fatigue.                              On review of systems, the patient states that he is doing very well in general and continues without complaints.  He tolerated his recent radiotherapy very well and has not experienced any ill side effects associated with treatment.  He met with Dr. WPascal Luxin interventional radiology on 11/22/2020 for consideration of possible cryoablation of the local recurrence in the lower pole of the left kidney. A repeat CT abdomen was performed on 12/15/20 for treatment planning and unfortunately showed signs of disease progression with enlarging mass in the left retroperitoneum in the surgical bed as well as increased soft tissue density in the inferior left renal sinus fat and other scattered small nodules seen inferiorly along the plane of the sigmoid mesocolon which appeared new and felt likely to represent additional sites of disease.  Therefore, cryoablation was not felt to  be the appropriate treatment and instead, the patient was [facility]  Repeat C C/A/P was performed 12/29/20 confirmed disease progression in the retroperitoneum but no findings highly suspicious for metasttic disease in the chest.  He will start oral systemic therapy with cabozantinib (Cabometryx) 60 mg daily on 01/12/21 and will follow up with Dr. YTasia Catchingsnext week to assess tolerance.  ALLERGIES:  has No Known Allergies.  Meds: Current Outpatient Medications  Medication Sig Dispense Refill  . acetaminophen (TYLENOL) 650 MG CR tablet Take by mouth.    .Marland KitchenamLODipine (NORVASC) 10 MG tablet Take 10 mg by mouth daily.    .Marland Kitchenatorvastatin (LIPITOR) 10 MG tablet Take 1 tablet by mouth daily.    . carvedilol (COREG) 25 MG tablet Take 25 mg by mouth 2 (two) times daily.    . fenofibrate (TRICOR) 145 MG tablet Take 145 mg by mouth daily.    . furosemide (LASIX) 20 MG tablet Take 10 mg by mouth daily.    .Marland KitchenlamoTRIgine (LAMICTAL) 100 MG tablet Take 100 mg by mouth 2 (two) times daily.    .Marland KitchenNOVOLOG MIX 70/30 FLEXPEN (70-30) 100 UNIT/ML FlexPen Inject 30 Units as directed daily.    .Marland Kitchenomeprazole (PRILOSEC) 40 MG capsule Take 40 mg by mouth daily.    . tamsulosin (FLOMAX) 0.4 MG CAPS capsule Take 0.4 mg by mouth daily.     .Marland Kitchen  ULTICARE PEN NEEDLES 29G X 12.7MM MISC     . Valbenazine Tosylate (INGREZZA) 40 MG CAPS Take 40 mg by mouth daily.     . valsartan (DIOVAN) 160 MG tablet Take 160 mg by mouth daily.    . ziprasidone (GEODON) 80 MG capsule Take 80 mg by mouth 2 (two) times daily.    . cabozantinib (CABOMETYX) 40 MG tablet Take 1 tablet (40 mg total) by mouth daily. Take on an empty stomach, 1 hour before or 2 hours after meals. 30 tablet 1  . ondansetron (ZOFRAN) 8 MG tablet Take 1 tablet (8 mg total) by mouth every 8 (eight) hours as needed for nausea or vomiting. 20 tablet 0   No current facility-administered medications for this encounter.     Physical Findings:  vitals were not taken for this visit.   /Unable to assess due to telephone follow-up visit format.  Lab Findings: Lab Results  Component Value Date   WBC 4.7 01/06/2021   HGB 11.2 (L) 01/06/2021   HCT 32.9 (L) 01/06/2021   MCV 91.1 01/06/2021   PLT 232 01/06/2021     Radiographic Findings: CT ABDOMEN WO CONTRAST  Result Date: 12/15/2020 CLINICAL DATA:  Pre embolization planning. History of papillary renal cell carcinoma. EXAM: CT ABDOMEN WITHOUT CONTRAST TECHNIQUE: Multidetector CT imaging of the abdomen was performed following the standard protocol without IV contrast. COMPARISON:  MRI of October 10, 2020 FINDINGS: Lower chest: Lung bases are clear.  No effusion.  No consolidation. Hepatobiliary: Liver with smooth contours. Signs of hepatic cysts seen on recent MRI. Cholelithiasis. No pericholecystic stranding. Pancreas: Pancreas without signs of ductal dilation grossly or evidence of inflammation. Spleen: Spleen normal size and contour. Adrenals/Urinary Tract: Adrenal glands are normal. Cysts arising from the RIGHT kidney are stable grossly on this noncontrast assessment. Central area of added density in the lower renal sinus fat following partial nephrectomy measures approximately 2 cm this is in the area of concern raised on the previous MRI. In the adjacent retroperitoneum there are soft tissue nodules measuring between 5 and 7 mm best seen on image 38 of series 2. On image 39 of series 2 there is a nodular density along postoperative plane measuring 10 mm Bulky para-aortic adenopathy in the LEFT retroperitoneum 2.5 cm short axis lymph node along the LEFT periaortic chain previously 2.0 cm. No hydronephrosis.  No perinephric stranding. Stomach/Bowel: Small hiatal hernia. No acute gastrointestinal process. Colonic diverticulosis. Bowel incompletely imaged on this abdominal CT. Appendix is normal. Vascular/Lymphatic: Calcified atheromatous plaque in the abdominal aorta  without aneurysmal dilation. Smooth contour of the IVC. Adenopathy in the retroperitoneum as described. Other: Nodular changes in the LEFT retroperitoneum. Also small nodules or lymph nodes present along the retroperitoneum or potentially within the sigmoid mesentery measuring between 5 and 6 mm seen on image 49 in 51 respectively with other small nodules or lymph nodes in this space. Most suspicious areas are along the lateral and posterior LEFT flank in the posterior pararenal space and along the transversalis fascia on image 38 described in the renal section. Musculoskeletal: No acute musculoskeletal process. No destructive bone finding. Spinal degenerative changes. IMPRESSION: 1. Nodular changes about the LEFT retroperitoneum in the surgical bed suspicious for areas of disease recurrence in the LEFT retroperitoneum and along the LEFT flank. 2. Signs of increased soft tissue density about the inferior LEFT renal sinus fat pole following partial nephrectomy again suspicious for recurrent disease locally. 3. Other scattered small nodules seen inferiorly along the plane of  the sigmoid mesocolon not present on preoperative evaluations may serve as additional evidence of disease. 4. Cholelithiasis. 5. Colonic diverticulosis. 6. Small hiatal hernia. 7. Aortic atherosclerosis. These results will be called to the ordering clinician or representative by the Radiologist Assistant, and communication documented in the PACS or [employer]. Electronically Signed   By: Zetta Bills M.D.   On: 12/15/2020 14:24   CT CHEST ABDOMEN PELVIS WO CONTRAST  Result Date: 12/31/2020 CLINICAL DATA:  Partial left nephrectomy 08/10/2019 for renal cell carcinoma. Suspected local recurrence and metastatic disease on recent imaging. Restaging. EXAM: CT CHEST, ABDOMEN AND PELVIS WITHOUT CONTRAST TECHNIQUE: Multidetector CT imaging of the chest, abdomen and pelvis was performed following the standard protocol without IV contrast.  COMPARISON:  12/15/2020 unenhanced CT abdomen. 10/10/2020 MRI abdomen. 09/26/2020 chest CT. FINDINGS: CT CHEST FINDINGS Cardiovascular: Normal heart size. No significant pericardial effusion/thickening. Three-vessel coronary atherosclerosis. Atherosclerotic nonaneurysmal thoracic aorta. Normal caliber pulmonary arteries. Mediastinum/Nodes: Stable hypodense 1.9 cm anterior right thyroid nodule. Unremarkable esophagus. No pathologically enlarged axillary, mediastinal or hilar lymph nodes, noting limited sensitivity for the detection of hilar adenopathy on this noncontrast study. Lungs/Pleura: No pneumothorax. No pleural effusion. Moderate to severe centrilobular and paraseptal emphysema with diffuse bronchial wall thickening. Patchy clustered solid lobulated pulmonary nodules in posterior right lower lobe, largest 1.2 cm (series 4/image 76), new. No acute consolidative airspace disease or lung masses. Two right middle lobe tiny pulmonary nodules, largest 0.3 cm (series 4/image 73), both stable. Musculoskeletal: No aggressive appearing focal osseous lesions. Moderate thoracic spondylosis. CT ABDOMEN PELVIS FINDINGS Hepatobiliary: Normal liver size. A few scattered tiny subcentimeter hypodense liver lesions are too small to characterize, are unchanged from prior MRI studies, where they were characterized as benign liver cysts. No appreciable new liver lesions. Cholelithiasis. No biliary ductal dilatation. Pancreas: Normal, with no mass or duct dilation. Spleen: Normal size. No mass. Adrenals/Urinary Tract: No discrete adrenal nodules. No renal stones. No hydronephrosis. Solid 2.7 x 2.1 cm mass in the lower left kidney partial nephrectomy bed (series 2/image 73), previously 2.6 x 1.8 cm on 12/15/2020 CT, slightly increased. Several simple renal cysts scattered in both kidneys, largest 3.6 cm in the lower right kidney. Scattered small soft tissue nodules throughout the left retroperitoneum as previously detailed, not  substantially changed from recent unenhanced CT, for example measuring 1.0 cm near the descending colon (series 2/image 74), 0.8 cm in the lateral fat (series 2/image 73) and 0.7 cm more inferiorly (series 2/image 88). Normal bladder. Stomach/Bowel: Normal non-distended stomach. Normal caliber small bowel with no small bowel wall thickening. Normal appendix. Moderate diffuse colonic diverticulosis with no large bowel wall thickening or significant pericolonic fat stranding. Vascular/Lymphatic: Atherosclerotic nonaneurysmal abdominal aorta. Enlarged 2.7 cm short axis diameter left periaortic node (series 2/image 72), slightly increased from 2.6 cm. No additional sites of adenopathy. Reproductive: Normal size prostate. Other: No pneumoperitoneum, ascites or focal fluid collection. Musculoskeletal: No aggressive appearing focal osseous lesions. Marked lumbar spondylosis. IMPRESSION: 1. Enlarging locally recurrent solid 2.7 cm neoplasm in the lower left kidney partial nephrectomy bed. 2. Enlarging left para-aortic nodal metastasis. 3. Scattered small soft tissue nodules throughout the left retroperitoneum are not substantially changed from recent unenhanced CT studies, suspicious for scattered left retroperitoneal metastases. 4. No findings highly suspicious for metastatic disease in the chest. 5. Patchy clustered solid lobulated pulmonary nodules in the posterior right lower lobe, largest 1.2 cm, new, indeterminate, favor infectious or inflammatory etiology. Two tiny right middle lobe nodules up to 0.3 cm are unchanged.  Follow-up chest CT suggested in 3 months. 6. Moderate colonic diverticulosis. 7. Three-vessel coronary atherosclerosis. 8. Aortic Atherosclerosis (ICD10-I70.0) and Emphysema (ICD10-J43.9). Electronically Signed   By: Ilona Sorrel M.D.   On: 12/31/2020 09:49   IR Radiologist Eval & Mgmt  Result Date: 12/21/2020 Please refer to notes tab for details about interventional procedure. (Op  Note)   Impression/Plan: 43. 70 yo man with an isolated oligometastatic 2.2 cm left para aortic lymph node from renal cell cancer. He appears to have recovered well from the effects of his recent SBRT and is currently without complaints.  He specifically denies any skin irritation, abdominal pain, nausea, vomiting, diarrhea or constipation.  He reports a healthy appetite and is maintaining his weight.  He denies any significant fatigue.  In light of evidence of disease progression on recent restaging CT C/A/P, he will start oral systemic therapy with cabozantinib (Cabometryx) 60 mg daily on 01/12/21 and will follow up with Dr. Tasia Catchings next week to assess tolerance.  He wishes to postpone follow-up in [place] with Dr. Alinda Money due to concerns with the travel to [name] until he knows how he is going to tolerate the new oral chemotherapy.  I advised him that I would share this information with Dr. Alinda Money so that they can coordinate future follow-up appropriately.  He will also continue in routine follow-up under the care and direction of Dr. Tasia Catchings at the [name] for continued management of his systemic disease.  We discussed that while we are happy to continue to participate in his care if clinically indicated, at this point, we will plan to see him back on an as-needed basis.  He knows that he is welcome to call anytime with any questions or concerns related to his previous radiation.    Nicholos Johns, PA-C

## 2021-01-11 NOTE — Progress Notes (Signed)
  Radiation Oncology         [phone] ________________________________  Name: Riley Stuart MRN: [id number]  Date: 12/09/2020  DOB: 1951/01/02  End of Treatment Note  Diagnosis:   70 yo man with an isolated oligometastatic 2.2 cm left para aortic lymph node from renal cell cancer     Indication for treatment:  Curative, Definitive SBRT       Radiation treatment dates:   11/29/20 - 12/09/20  Site/dose:   The left para aortic lymph node target was treated to 50 Gy in 5 fractions of 10 Gy  Beams/energy:   The patient was treated using stereotactic body radiotherapy according to a 3D conformal radiotherapy plan.  Volumetric arc fields were employed to deliver 6 MV X-rays.  Image guidance was performed with per fraction cone beam CT prior to treatment under personal MD supervision.  Immobilization was achieved using BodyFix Pillow.  Narrative: The patient tolerated radiation treatment relatively well without any ill side effects.  He specifically denied any abdominal pain, nausea, vomiting, diarrhea or constipation.  He did not experience any significant fatigue.  Plan: The patient has completed radiation treatment. The patient will return to radiation oncology clinic for routine followup in one month. I advised them to call or return sooner if they have any questions or concerns related to their recovery or treatment. ________________________________  Sheral Apley. Tammi Klippel, M.D.

## 2021-01-12 ENCOUNTER — Telehealth: Payer: Self-pay

## 2021-01-12 ENCOUNTER — Telehealth: Payer: Self-pay | Admitting: Oncology

## 2021-01-12 DIAGNOSIS — C642 Malignant neoplasm of left kidney, except renal pelvis: Secondary | ICD-10-CM

## 2021-01-12 NOTE — Telephone Encounter (Signed)
Per 5/16 ph note from Bethena Roys: Cabometyx will be filled through [facility] and mailed 01/10/21 for delivery 01/11/21.  Message sent to scheduling pool to shedule lab/MD 1 week after start per MD.

## 2021-01-12 NOTE — Telephone Encounter (Signed)
-----   Message from Darl Pikes, [place] sent at 01/06/2021  3:52 PM EDT ----- We will get to work on the cabozantinib!  -Alyson ----- Message ----- From: Evelina Dun, RN Sent: 01/06/2021   3:41 PM EDT To: Sheliah Hatch, CPhT, Vanice Sarah, CMA, #   ----- Message ----- From: Earlie Server, MD Sent: 01/06/2021  12:38 PM EDT To: Sheliah Hatch, CPhT, Evelina Dun, RN, #  Teryl Lucy,  I plan to start him on cabozatinib 40mg  daily for RCC. Please check coverage and give him chemo education. He can start when he receives  Team, I will see him 1 week after start.  lab cbc cmp MD   Thanks  Talbert Cage

## 2021-01-12 NOTE — Telephone Encounter (Addendum)
Please schedule pt for lab/MD on 5/27 and notify pt/ pt caregiver of appt. Thanks

## 2021-01-12 NOTE — Telephone Encounter (Signed)
Left VM for caregiver to contact office to schedule a lab and MD follow-up for 5/25 per scheduling message on 5/19.

## 2021-01-13 NOTE — Telephone Encounter (Signed)
01/13/2021 Spoke w/ pt's caregiver and informed her of f/u appt on 5/25 (okay per elizabeth) for lab/MD. Caregiver confirmed the appts  SRW

## 2021-01-17 DIAGNOSIS — N184 Chronic kidney disease, stage 4 (severe): Secondary | ICD-10-CM | POA: Diagnosis not present

## 2021-01-17 DIAGNOSIS — E119 Type 2 diabetes mellitus without complications: Secondary | ICD-10-CM | POA: Diagnosis not present

## 2021-01-17 DIAGNOSIS — C649 Malignant neoplasm of unspecified kidney, except renal pelvis: Secondary | ICD-10-CM | POA: Diagnosis not present

## 2021-01-17 DIAGNOSIS — I10 Essential (primary) hypertension: Secondary | ICD-10-CM | POA: Diagnosis not present

## 2021-01-18 ENCOUNTER — Encounter: Payer: Self-pay | Admitting: Oncology

## 2021-01-18 ENCOUNTER — Inpatient Hospital Stay: Payer: Medicare Other

## 2021-01-18 ENCOUNTER — Inpatient Hospital Stay (HOSPITAL_BASED_OUTPATIENT_CLINIC_OR_DEPARTMENT_OTHER): Payer: Medicare Other | Admitting: Oncology

## 2021-01-18 ENCOUNTER — Other Ambulatory Visit: Payer: Self-pay

## 2021-01-18 VITALS — BP 162/81 | HR 43 | Temp 96.7°F | Resp 18 | Wt 236.2 lb

## 2021-01-18 DIAGNOSIS — I129 Hypertensive chronic kidney disease with stage 1 through stage 4 chronic kidney disease, or unspecified chronic kidney disease: Secondary | ICD-10-CM | POA: Diagnosis not present

## 2021-01-18 DIAGNOSIS — D631 Anemia in chronic kidney disease: Secondary | ICD-10-CM

## 2021-01-18 DIAGNOSIS — Z7189 Other specified counseling: Secondary | ICD-10-CM

## 2021-01-18 DIAGNOSIS — N189 Chronic kidney disease, unspecified: Secondary | ICD-10-CM | POA: Insufficient documentation

## 2021-01-18 DIAGNOSIS — N184 Chronic kidney disease, stage 4 (severe): Secondary | ICD-10-CM | POA: Diagnosis not present

## 2021-01-18 DIAGNOSIS — C642 Malignant neoplasm of left kidney, except renal pelvis: Secondary | ICD-10-CM | POA: Diagnosis not present

## 2021-01-18 DIAGNOSIS — Z5111 Encounter for antineoplastic chemotherapy: Secondary | ICD-10-CM | POA: Insufficient documentation

## 2021-01-18 DIAGNOSIS — E1122 Type 2 diabetes mellitus with diabetic chronic kidney disease: Secondary | ICD-10-CM | POA: Diagnosis not present

## 2021-01-18 LAB — CBC WITH DIFFERENTIAL/PLATELET
Abs Immature Granulocytes: 0.02 10*3/uL (ref 0.00–0.07)
Basophils Absolute: 0 10*3/uL (ref 0.0–0.1)
Basophils Relative: 0 %
Eosinophils Absolute: 0 10*3/uL (ref 0.0–0.5)
Eosinophils Relative: 0 %
HCT: 35.2 % — ABNORMAL LOW (ref 39.0–52.0)
Hemoglobin: 11.9 g/dL — ABNORMAL LOW (ref 13.0–17.0)
Immature Granulocytes: 0 %
Lymphocytes Relative: 22 %
Lymphs Abs: 1 10*3/uL (ref 0.7–4.0)
MCH: 30.4 pg (ref 26.0–34.0)
MCHC: 33.8 g/dL (ref 30.0–36.0)
MCV: 90 fL (ref 80.0–100.0)
Monocytes Absolute: 0.4 10*3/uL (ref 0.1–1.0)
Monocytes Relative: 9 %
Neutro Abs: 3.1 10*3/uL (ref 1.7–7.7)
Neutrophils Relative %: 69 %
Platelets: 247 10*3/uL (ref 150–400)
RBC: 3.91 MIL/uL — ABNORMAL LOW (ref 4.22–5.81)
RDW: 15.9 % — ABNORMAL HIGH (ref 11.5–15.5)
WBC: 4.6 10*3/uL (ref 4.0–10.5)
nRBC: 0 % (ref 0.0–0.2)

## 2021-01-18 LAB — COMPREHENSIVE METABOLIC PANEL
ALT: 13 U/L (ref 0–44)
AST: 17 U/L (ref 15–41)
Albumin: 3.8 g/dL (ref 3.5–5.0)
Alkaline Phosphatase: 57 U/L (ref 38–126)
Anion gap: 10 (ref 5–15)
BUN: 32 mg/dL — ABNORMAL HIGH (ref 8–23)
CO2: 24 mmol/L (ref 22–32)
Calcium: 8.7 mg/dL — ABNORMAL LOW (ref 8.9–10.3)
Chloride: 108 mmol/L (ref 98–111)
Creatinine, Ser: 2.89 mg/dL — ABNORMAL HIGH (ref 0.61–1.24)
GFR, Estimated: 23 mL/min — ABNORMAL LOW (ref >60)
Glucose, Bld: 108 mg/dL — ABNORMAL HIGH (ref 70–99)
Potassium: 4 mmol/L (ref 3.5–5.1)
Sodium: 142 mmol/L (ref 135–145)
Total Bilirubin: 0.4 mg/dL (ref 0.3–1.2)
Total Protein: 6.9 g/dL (ref 6.5–8.1)

## 2021-01-18 NOTE — Progress Notes (Signed)
Patient denies new problems/concerns today.   °

## 2021-01-18 NOTE — Progress Notes (Signed)
Hematology/Oncology Consult note [name] [phone] [phone]   Patient Care Team: Jodi Marble, MD as PCP - General (Internal Medicine) Marden Noble, MD (Internal Medicine) Tyler Pita, MD as Consulting Physician (Radiation Oncology)  REFERRING PROVIDER: Jodi Marble, MD  CHIEF COMPLAINTS/REASON FOR VISIT:  Follow-up for treatment of recurrent RCC  HISTORY OF PRESENTING ILLNESS:   Riley Stuart is a  70 y.o.  male with PMH listed below was seen in consultation at the request of  Jodi Marble, MD  for evaluation of [place] Patient has schizoaffective disorder and lives in the group home.  He is quite functional at baseline and makes his own medical decision. 11/27/2018 CT abdomen pelvis without contrast showed solid-appearing mass of the lower pole of the left kidney measuring up to 6.8 cm concerning for RCC. 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 08/10/2019, patient underwent partial left nephrectomy and results showed papillary renal cell carcinoma, type II, nuclear grade 3.  Size 9 cm.  Tumor invades perirenal fat (pT3a). Patient went image surveillance. 10/10/2020 , MRI abdomen with and without contrast showed new 2.4 x 1.8 cm faintly enhancing nodularity along the left kidney lower pole partial nephrectomy sites.  Suspicious for local recurrence.  New pathologically enlarged left periaortic lymph node, highly suspicious for malignancy in this context.  Numerous bilateral renal cysts.:  Diverticulosis lumbar spondylosis and degenerative disease 1.4 gallstone in the gallbladder.  Per note, urology Dr. Alinda Money has offered patient definitive surgical resection as well as regional lymph node dissection however patient declined the surgery due to his chronic kidney insufficiency and potential need of requiring dialysis after radical nephrectomy.  12/09/2020 status post SBRT to the  periaortic lymph node radiation oncology Dr. Tammi Klippel .  He was also seen by Dr. Pascal Lux for possible cryoablation.  Dr. Pascal Lux recommend repeat CT abdomen which has been ordered.  # 12/15/2020, CT abdomen without contrast showed nodular changes of the left retroperitoneum in the surgical bed suspicious for areas of disease recurrence in the left retroperitoneum and along the left flank.  Signs of increased soft tissue density about the inferior left renal sinus fat bowel following partial nephrectomy. Other scattered small nodules seen inferiorly along the plane of the sigmoid mesocolon not present on preoperative evaluations may serve as additional evidence of disease.    # Initially there was plan for cryoablation.  Patient was evaluated by IR Dr. Pascal Lux, however due to the disease progression on recent CT, Dr. Pascal Lux does not feel that patient will benefit from cryoablation at this point.   01/20/2021, started on cabozantinib 40 mg daily.  INTERVAL HISTORY Riley Stuart is a 70 y.o. male who has above history reviewed by me today presents for follow up visit for management of recurrent RCC Problems and complaints are listed below: Started on cabozantinib 40 mg daily since last week.  Overall tolerates well. Review of Systems  Constitutional: Negative for appetite change, chills, fatigue, fever and unexpected weight change.  HENT:   Negative for hearing loss and voice change.   Eyes: Negative for eye problems and icterus.  Respiratory: Negative for chest tightness, cough and shortness of breath.   Cardiovascular: Negative for chest pain and leg swelling.  Gastrointestinal: Negative for abdominal distention and abdominal pain.  Endocrine: Negative for hot flashes.  Genitourinary: Negative for difficulty urinating, dysuria and frequency.   Musculoskeletal: Negative for arthralgias.  Skin: Negative for itching and rash.  Neurological: Negative for  light-headedness and numbness.   Hematological: Negative for adenopathy. Does not bruise/bleed easily.  Psychiatric/Behavioral: Negative for confusion.    MEDICAL HISTORY:  Past Medical History:  Diagnosis Date  . Chronic kidney disease    renal insufficiency  . Diabetes mellitus without complication ([name])    Pt takes Insulin  . Hypertension   . Mental disorder    schizoaffective  . Renal cell carcinoma ([place])     SURGICAL HISTORY: Past Surgical History:  Procedure Laterality Date  . ANKLE FRACTURE SURGERY    . COLONOSCOPY WITH PROPOFOL N/A 04/15/2020   Procedure: COLONOSCOPY WITH PROPOFOL;  Surgeon: Virgel Manifold, MD;  Location: [facility] ENDOSCOPY;  Service: Endoscopy;  Laterality: N/A;  . CRANIOTOMY Left 10/27/2013   Procedure: Craniotomy for Aneurysm Clipping;  Surgeon: Consuella Lose, MD;  Location: [place] NEURO ORS;  Service: Neurosurgery;  Laterality: Left;  . IR RADIOLOGIST EVAL & MGMT  11/22/2020  . IR RADIOLOGIST EVAL & MGMT  12/21/2020  . KNEE SURGERY     due to fracture.   . ROBOTIC ASSITED PARTIAL NEPHRECTOMY Left 08/10/2019   Procedure: XI ROBOTIC ASSITED LAPAROSCOPIC  PARTIAL NEPHRECTOMY;  Surgeon: Raynelle Bring, MD;  Location: [facility] ORS;  Service: Urology;  Laterality: Left;    SOCIAL HISTORY: Social History   Socioeconomic History  . Marital status: Single    Spouse name: Not on file  . Number of children: 0  . Years of education: Not on file  . Highest education level: Not on file  Occupational History  . Not on file  Tobacco Use  . Smoking status: Current Every Day Smoker    Packs/day: 0.50    Years: 48.00    Pack years: 24.00    Types: Cigarettes  . Smokeless tobacco: Never Used  Vaping Use  . Vaping Use: Never used  Substance and Sexual Activity  . Alcohol use: Not Currently  . Drug use: Never  . Sexual activity: Not Currently  Other Topics Concern  . Not on file  Social History Narrative  . Not on file   Social Determinants of Health   Financial Resource Strain: Not on  file  Food Insecurity: Not on file  Transportation Needs: Not on file  Physical Activity: Not on file  Stress: Not on file  Social Connections: Not on file  Intimate Partner Violence: Not on file    FAMILY HISTORY: Family History  Problem Relation Age of Onset  . Breast cancer Mother   . Throat cancer Brother   . Prostate cancer Neg Hx   . Colon cancer Neg Hx   . Pancreatic cancer Neg Hx     ALLERGIES:  has No Known Allergies.  MEDICATIONS:  Current Outpatient Medications  Medication Sig Dispense Refill  . acetaminophen (TYLENOL) 650 MG CR tablet Take by mouth.    Marland Kitchen amLODipine (NORVASC) 10 MG tablet Take 10 mg by mouth daily.    Marland Kitchen atorvastatin (LIPITOR) 10 MG tablet Take 1 tablet by mouth daily.    . cabozantinib (CABOMETYX) 40 MG tablet Take 1 tablet (40 mg total) by mouth daily. Take on an empty stomach, 1 hour before or 2 hours after meals. 30 tablet 1  . carvedilol (COREG) 25 MG tablet Take 25 mg by mouth 2 (two) times daily.    . fenofibrate (TRICOR) 145 MG tablet Take 145 mg by mouth daily.    . furosemide (LASIX) 20 MG tablet Take 10 mg by mouth daily.    Marland Kitchen lamoTRIgine (LAMICTAL) 100 MG tablet Take  100 mg by mouth 2 (two) times daily.    Marland Kitchen NOVOLOG MIX 70/30 FLEXPEN (70-30) 100 UNIT/ML FlexPen Inject 30 Units as directed daily.    Marland Kitchen omeprazole (PRILOSEC) 40 MG capsule Take 40 mg by mouth daily.    . ondansetron (ZOFRAN) 8 MG tablet Take 1 tablet (8 mg total) by mouth every 8 (eight) hours as needed for nausea or vomiting. 20 tablet 0  . tamsulosin (FLOMAX) 0.4 MG CAPS capsule Take 0.4 mg by mouth daily.     Marland Kitchen ULTICARE PEN NEEDLES 29G X 12.7MM MISC     . Valbenazine Tosylate (INGREZZA) 40 MG CAPS Take 40 mg by mouth daily.     . valsartan (DIOVAN) 160 MG tablet Take 160 mg by mouth daily.    . ziprasidone (GEODON) 80 MG capsule Take 80 mg by mouth 2 (two) times daily.     No current facility-administered medications for this visit.     PHYSICAL EXAMINATION: ECOG  PERFORMANCE STATUS: 1 - Symptomatic but completely ambulatory Vitals:   01/18/21 1120  BP: (!) 162/81  Pulse: (!) 43  Resp: 18  Temp: (!) 96.7 F (35.9 C)   Filed Weights   01/18/21 1120  Weight: 236 lb 3.2 oz (107.1 kg)    Physical Exam Constitutional:      General: He is not in acute distress. HENT:     Head: Normocephalic and atraumatic.  Eyes:     General: No scleral icterus. Cardiovascular:     Rate and Rhythm: Normal rate and regular rhythm.     Heart sounds: Normal heart sounds.  Pulmonary:     Effort: Pulmonary effort is normal. No respiratory distress.     Breath sounds: No wheezing.  Abdominal:     General: Bowel sounds are normal. There is no distension.     Palpations: Abdomen is soft.  Musculoskeletal:        General: No deformity. Normal range of motion.     Cervical back: Normal range of motion and neck supple.  Skin:    General: Skin is warm and dry.     Findings: No erythema or rash.  Neurological:     Mental Status: He is alert and oriented to person, place, and time. Mental status is at baseline.     Comments: Patient talks slowly  Psychiatric:        Mood and Affect: Mood normal.     LABORATORY DATA:  I have reviewed the data as listed Lab Results  Component Value Date   WBC 4.6 01/18/2021   HGB 11.9 (L) 01/18/2021   HCT 35.2 (L) 01/18/2021   MCV 90.0 01/18/2021   PLT 247 01/18/2021   Recent Labs    11/28/20 1146 01/06/21 0959 01/18/21 1101  NA 143 143 142  K 3.8 3.7 4.0  CL 108 110 108  CO2 '26 24 24  ' GLUCOSE 107* 97 108*  BUN 22 29* 32*  CREATININE 2.95* 3.15* 2.89*  CALCIUM 8.9 8.8* 8.7*  GFRNONAA 22* 20* 23*  PROT 6.9 6.8 6.9  ALBUMIN 3.6 3.7 3.8  AST 14* 16 17  ALT '11 11 13  ' ALKPHOS 49 54 57  BILITOT 0.6 0.5 0.4   Iron/TIBC/Ferritin/ %Sat No results found for: IRON, TIBC, FERRITIN, IRONPCTSAT    RADIOGRAPHIC STUDIES: I have personally reviewed the radiological images as listed and agreed with the findings in the  report. CT CHEST ABDOMEN PELVIS WO CONTRAST  Result Date: 12/31/2020 CLINICAL DATA:  Partial left nephrectomy 08/10/2019 for  renal cell carcinoma. Suspected local recurrence and metastatic disease on recent imaging. Restaging. EXAM: CT CHEST, ABDOMEN AND PELVIS WITHOUT CONTRAST TECHNIQUE: Multidetector CT imaging of the chest, abdomen and pelvis was performed following the standard protocol without IV contrast. COMPARISON:  12/15/2020 unenhanced CT abdomen. 10/10/2020 MRI abdomen. 09/26/2020 chest CT. FINDINGS: CT CHEST FINDINGS Cardiovascular: Normal heart size. No significant pericardial effusion/thickening. Three-vessel coronary atherosclerosis. Atherosclerotic nonaneurysmal thoracic aorta. Normal caliber pulmonary arteries. Mediastinum/Nodes: Stable hypodense 1.9 cm anterior right thyroid nodule. Unremarkable esophagus. No pathologically enlarged axillary, mediastinal or hilar lymph nodes, noting limited sensitivity for the detection of hilar adenopathy on this noncontrast study. Lungs/Pleura: No pneumothorax. No pleural effusion. Moderate to severe centrilobular and paraseptal emphysema with diffuse bronchial wall thickening. Patchy clustered solid lobulated pulmonary nodules in posterior right lower lobe, largest 1.2 cm (series 4/image 76), new. No acute consolidative airspace disease or lung masses. Two right middle lobe tiny pulmonary nodules, largest 0.3 cm (series 4/image 73), both stable. Musculoskeletal: No aggressive appearing focal osseous lesions. Moderate thoracic spondylosis. CT ABDOMEN PELVIS FINDINGS Hepatobiliary: Normal liver size. A few scattered tiny subcentimeter hypodense liver lesions are too small to characterize, are unchanged from prior MRI studies, where they were characterized as benign liver cysts. No appreciable new liver lesions. Cholelithiasis. No biliary ductal dilatation. Pancreas: Normal, with no mass or duct dilation. Spleen: Normal size. No mass. Adrenals/Urinary Tract: No  discrete adrenal nodules. No renal stones. No hydronephrosis. Solid 2.7 x 2.1 cm mass in the lower left kidney partial nephrectomy bed (series 2/image 73), previously 2.6 x 1.8 cm on 12/15/2020 CT, slightly increased. Several simple renal cysts scattered in both kidneys, largest 3.6 cm in the lower right kidney. Scattered small soft tissue nodules throughout the left retroperitoneum as previously detailed, not substantially changed from recent unenhanced CT, for example measuring 1.0 cm near the descending colon (series 2/image 74), 0.8 cm in the lateral fat (series 2/image 73) and 0.7 cm more inferiorly (series 2/image 88). Normal bladder. Stomach/Bowel: Normal non-distended stomach. Normal caliber small bowel with no small bowel wall thickening. Normal appendix. Moderate diffuse colonic diverticulosis with no large bowel wall thickening or significant pericolonic fat stranding. Vascular/Lymphatic: Atherosclerotic nonaneurysmal abdominal aorta. Enlarged 2.7 cm short axis diameter left periaortic node (series 2/image 72), slightly increased from 2.6 cm. No additional sites of adenopathy. Reproductive: Normal size prostate. Other: No pneumoperitoneum, ascites or focal fluid collection. Musculoskeletal: No aggressive appearing focal osseous lesions. Marked lumbar spondylosis. IMPRESSION: 1. Enlarging locally recurrent solid 2.7 cm neoplasm in the lower left kidney partial nephrectomy bed. 2. Enlarging left para-aortic nodal metastasis. 3. Scattered small soft tissue nodules throughout the left retroperitoneum are not substantially changed from recent unenhanced CT studies, suspicious for scattered left retroperitoneal metastases. 4. No findings highly suspicious for metastatic disease in the chest. 5. Patchy clustered solid lobulated pulmonary nodules in the posterior right lower lobe, largest 1.2 cm, new, indeterminate, favor infectious or inflammatory etiology. Two tiny right middle lobe nodules up to 0.3 cm are  unchanged. Follow-up chest CT suggested in 3 months. 6. Moderate colonic diverticulosis. 7. Three-vessel coronary atherosclerosis. 8. Aortic Atherosclerosis (ICD10-I70.0) and Emphysema (ICD10-J43.9). Electronically Signed   By: Ilona Sorrel M.D.   On: 12/31/2020 09:49   IR Radiologist Eval & Mgmt  Result Date: 12/21/2020 Please refer to notes tab for details about interventional procedure. (Op Note)     ASSESSMENT & PLAN:  1. Cancer of kidney, left ([place])   2. Encounter for antineoplastic chemotherapy   3. Stage 4  chronic kidney disease ([place])   4. Goals of care, counseling/discussion   5. Anemia due to stage 4 chronic kidney disease [facility])   Cancer Staging Cancer of kidney, left [name]) Staging form: Kidney, AJCC 8th Edition - Clinical stage from 11/28/2020: Stage III (rcT1a, cN1, cM0) - Signed by Earlie Server, MD on 11/28/2020   Recurrent left papillary renal cell carcinoma, with nodal involvement- SBRT 11/2020 by Dr.Manning. Patient declines surgery.  Labs are reviewed and discussed with patient. Clinically he tolerates cabozantinib 86m- palliative intent. Continue current regimen.  CKD stage IV, Encourage oral hydration. Check multiple myeloma panel at next vist Anemia due to CKD, stable Hb  Follow up in 3 weeks.   Orders Placed This Encounter  Procedures  . CBC with Differential/Platelet    Standing Status:   Future    Standing Expiration Date:   01/18/2022  . Comprehensive metabolic panel    Standing Status:   Future    Standing Expiration Date:   01/18/2022  . Lactate dehydrogenase    Standing Status:   Future    Standing Expiration Date:   01/18/2022    All questions were answered. The patient knows to call the clinic with any problems questions or concerns.  cc TJodi Marble MD    Return of visit: 3 weeks  ZEarlie Server MD, PhD Hematology Oncology CAscension Seton Northwest Hospitalat ACoryell Memorial HospitalPager- [phone]/25/2022

## 2021-01-27 ENCOUNTER — Other Ambulatory Visit (HOSPITAL_COMMUNITY): Payer: Self-pay

## 2021-02-01 ENCOUNTER — Other Ambulatory Visit (HOSPITAL_COMMUNITY): Payer: Self-pay

## 2021-02-01 DIAGNOSIS — I129 Hypertensive chronic kidney disease with stage 1 through stage 4 chronic kidney disease, or unspecified chronic kidney disease: Secondary | ICD-10-CM | POA: Diagnosis not present

## 2021-02-01 DIAGNOSIS — N184 Chronic kidney disease, stage 4 (severe): Secondary | ICD-10-CM | POA: Diagnosis not present

## 2021-02-01 DIAGNOSIS — R809 Proteinuria, unspecified: Secondary | ICD-10-CM | POA: Diagnosis not present

## 2021-02-01 DIAGNOSIS — E1122 Type 2 diabetes mellitus with diabetic chronic kidney disease: Secondary | ICD-10-CM | POA: Diagnosis not present

## 2021-02-06 ENCOUNTER — Other Ambulatory Visit (HOSPITAL_COMMUNITY): Payer: Self-pay

## 2021-02-06 DIAGNOSIS — E119 Type 2 diabetes mellitus without complications: Secondary | ICD-10-CM | POA: Diagnosis not present

## 2021-02-06 DIAGNOSIS — N184 Chronic kidney disease, stage 4 (severe): Secondary | ICD-10-CM | POA: Diagnosis not present

## 2021-02-06 DIAGNOSIS — C649 Malignant neoplasm of unspecified kidney, except renal pelvis: Secondary | ICD-10-CM | POA: Diagnosis not present

## 2021-02-06 DIAGNOSIS — I10 Essential (primary) hypertension: Secondary | ICD-10-CM | POA: Diagnosis not present

## 2021-02-07 ENCOUNTER — Other Ambulatory Visit (HOSPITAL_COMMUNITY): Payer: Self-pay

## 2021-02-08 ENCOUNTER — Telehealth: Payer: Self-pay | Admitting: Podiatry

## 2021-02-08 DIAGNOSIS — R809 Proteinuria, unspecified: Secondary | ICD-10-CM | POA: Diagnosis not present

## 2021-02-08 DIAGNOSIS — N2889 Other specified disorders of kidney and ureter: Secondary | ICD-10-CM | POA: Diagnosis not present

## 2021-02-08 DIAGNOSIS — I129 Hypertensive chronic kidney disease with stage 1 through stage 4 chronic kidney disease, or unspecified chronic kidney disease: Secondary | ICD-10-CM | POA: Diagnosis not present

## 2021-02-08 DIAGNOSIS — D631 Anemia in chronic kidney disease: Secondary | ICD-10-CM | POA: Insufficient documentation

## 2021-02-08 DIAGNOSIS — E1122 Type 2 diabetes mellitus with diabetic chronic kidney disease: Secondary | ICD-10-CM | POA: Diagnosis not present

## 2021-02-08 DIAGNOSIS — N184 Chronic kidney disease, stage 4 (severe): Secondary | ICD-10-CM | POA: Diagnosis not present

## 2021-02-08 NOTE — Telephone Encounter (Signed)
Pt called checking status of diabetic shoes. I explained that they should be shipping anytime that there was a delay from manufacturer.

## 2021-02-10 ENCOUNTER — Inpatient Hospital Stay (HOSPITAL_BASED_OUTPATIENT_CLINIC_OR_DEPARTMENT_OTHER): Payer: Medicare Other | Admitting: Oncology

## 2021-02-10 ENCOUNTER — Encounter: Payer: Self-pay | Admitting: Oncology

## 2021-02-10 ENCOUNTER — Inpatient Hospital Stay: Payer: Medicare Other | Attending: Oncology

## 2021-02-10 VITALS — BP 153/93 | HR 56 | Temp 96.3°F | Resp 16 | Ht 72.0 in | Wt 231.8 lb

## 2021-02-10 DIAGNOSIS — N184 Chronic kidney disease, stage 4 (severe): Secondary | ICD-10-CM

## 2021-02-10 DIAGNOSIS — I10 Essential (primary) hypertension: Secondary | ICD-10-CM

## 2021-02-10 DIAGNOSIS — I129 Hypertensive chronic kidney disease with stage 1 through stage 4 chronic kidney disease, or unspecified chronic kidney disease: Secondary | ICD-10-CM | POA: Insufficient documentation

## 2021-02-10 DIAGNOSIS — C642 Malignant neoplasm of left kidney, except renal pelvis: Secondary | ICD-10-CM

## 2021-02-10 DIAGNOSIS — D631 Anemia in chronic kidney disease: Secondary | ICD-10-CM | POA: Insufficient documentation

## 2021-02-10 DIAGNOSIS — Z5111 Encounter for antineoplastic chemotherapy: Secondary | ICD-10-CM

## 2021-02-10 LAB — CBC WITH DIFFERENTIAL/PLATELET
Abs Immature Granulocytes: 0.01 10*3/uL (ref 0.00–0.07)
Basophils Absolute: 0 10*3/uL (ref 0.0–0.1)
Basophils Relative: 1 %
Eosinophils Absolute: 0 10*3/uL (ref 0.0–0.5)
Eosinophils Relative: 0 %
HCT: 38.9 % — ABNORMAL LOW (ref 39.0–52.0)
Hemoglobin: 13.3 g/dL (ref 13.0–17.0)
Immature Granulocytes: 0 %
Lymphocytes Relative: 24 %
Lymphs Abs: 1 10*3/uL (ref 0.7–4.0)
MCH: 30.9 pg (ref 26.0–34.0)
MCHC: 34.2 g/dL (ref 30.0–36.0)
MCV: 90.5 fL (ref 80.0–100.0)
Monocytes Absolute: 0.3 10*3/uL (ref 0.1–1.0)
Monocytes Relative: 6 %
Neutro Abs: 2.8 10*3/uL (ref 1.7–7.7)
Neutrophils Relative %: 69 %
Platelets: 248 10*3/uL (ref 150–400)
RBC: 4.3 MIL/uL (ref 4.22–5.81)
RDW: 16.6 % — ABNORMAL HIGH (ref 11.5–15.5)
WBC: 4.1 10*3/uL (ref 4.0–10.5)
nRBC: 0 % (ref 0.0–0.2)

## 2021-02-10 LAB — COMPREHENSIVE METABOLIC PANEL
ALT: 20 U/L (ref 0–44)
AST: 22 U/L (ref 15–41)
Albumin: 3.8 g/dL (ref 3.5–5.0)
Alkaline Phosphatase: 69 U/L (ref 38–126)
Anion gap: 10 (ref 5–15)
BUN: 33 mg/dL — ABNORMAL HIGH (ref 8–23)
CO2: 26 mmol/L (ref 22–32)
Calcium: 8.7 mg/dL — ABNORMAL LOW (ref 8.9–10.3)
Chloride: 104 mmol/L (ref 98–111)
Creatinine, Ser: 2.94 mg/dL — ABNORMAL HIGH (ref 0.61–1.24)
GFR, Estimated: 22 mL/min — ABNORMAL LOW (ref >60)
Glucose, Bld: 147 mg/dL — ABNORMAL HIGH (ref 70–99)
Potassium: 3.7 mmol/L (ref 3.5–5.1)
Sodium: 140 mmol/L (ref 135–145)
Total Bilirubin: 0.5 mg/dL (ref 0.3–1.2)
Total Protein: 7.2 g/dL (ref 6.5–8.1)

## 2021-02-10 LAB — LACTATE DEHYDROGENASE: LDH: 194 U/L — ABNORMAL HIGH (ref 98–192)

## 2021-02-10 NOTE — Progress Notes (Signed)
Hematology/Oncology follow up note [facility] [phone] Fax:([phone]) [phone]   Patient Care Team: Jodi Marble, MD as PCP - General (Internal Medicine) Marden Noble, MD (Internal Medicine) Tyler Pita, MD as Consulting Physician (Radiation Oncology)  REFERRING PROVIDER: Jodi Marble, MD  CHIEF COMPLAINTS/REASON FOR VISIT:  Follow-up for treatment of recurrent RCC  HISTORY OF PRESENTING ILLNESS:   Riley Stuart is a  70 y.o.  male with PMH listed below was seen in consultation at the request of  Jodi Marble, MD  for evaluation of Riley Stuart Patient has schizoaffective disorder and lives in the group home.  He is quite functional at baseline and makes his own medical decision. 11/27/2018 CT abdomen pelvis without contrast showed solid-appearing mass of the lower pole of the left kidney measuring up to 6.8 cm concerning for RCC. 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 08/10/2019, patient underwent partial left nephrectomy and results showed papillary renal cell carcinoma, type II, nuclear grade 3.  Size 9 cm.  Tumor invades perirenal fat (pT3a). Patient went image surveillance. 10/10/2020 , MRI abdomen with and without contrast showed new 2.4 x 1.8 cm faintly enhancing nodularity along the left kidney lower pole partial nephrectomy sites.  Suspicious for local recurrence.  New pathologically enlarged left periaortic lymph node, highly suspicious for malignancy in this context.  Numerous bilateral renal cysts.:  Diverticulosis lumbar spondylosis and degenerative disease 1.4 gallstone in the gallbladder.  Per note, urology Dr. Alinda Money has offered patient definitive surgical resection as well as regional lymph node dissection however patient declined the surgery due to his chronic kidney insufficiency and potential need of requiring dialysis after radical nephrectomy.  12/09/2020 status post SBRT to the  periaortic lymph node radiation oncology Dr. Tammi Klippel .  He was also seen by Dr. Pascal Lux for possible cryoablation.  Dr. Pascal Lux recommend repeat CT abdomen which has been ordered.  # 12/15/2020, CT abdomen without contrast showed nodular changes of the left retroperitoneum in the surgical bed suspicious for areas of disease recurrence in the left retroperitoneum and along the left flank.  Signs of increased soft tissue density about the inferior left renal sinus fat bowel following partial nephrectomy. Other scattered small nodules seen inferiorly along the plane of the sigmoid mesocolon not present on preoperative evaluations may serve as additional evidence of disease.    # Initially there was plan for cryoablation.  Patient was evaluated by IR Dr. Pascal Lux, however due to the disease progression on recent CT, Dr. Pascal Lux does not feel that patient will benefit from cryoablation at this point.   01/20/2021, started on cabozantinib 40 mg daily.  INTERVAL HISTORY Riley Stuart is a 70 y.o. male who has above history reviewed by me today presents for follow up visit for management of recurrent RCC Problems and complaints are listed below: on cabozantinib 40 mg daily since last week.  Overall tolerates well. Patient's blood pressure has been elevated and blood pressure medication has been adjusted recently. Otherwise no new complaints.    Review of Systems  Constitutional:  Negative for appetite change, chills, fatigue, fever and unexpected weight change.  HENT:   Negative for hearing loss and voice change.   Eyes:  Negative for eye problems and icterus.  Respiratory:  Negative for chest tightness, cough and shortness of breath.   Cardiovascular:  Negative for chest pain and leg swelling.  Gastrointestinal:  Negative for abdominal distention and abdominal pain.  Endocrine: Negative for hot flashes.  Genitourinary:  Negative for difficulty urinating, dysuria and frequency.   Musculoskeletal:   Negative for arthralgias.  Skin:  Negative for itching and rash.  Neurological:  Negative for light-headedness and numbness.  Hematological:  Negative for adenopathy. Does not bruise/bleed easily.  Psychiatric/Behavioral:  Negative for confusion.    MEDICAL HISTORY:  Past Medical History:  Diagnosis Date   Chronic kidney disease    renal insufficiency   Diabetes mellitus without complication ([place])    Pt takes Insulin   Hypertension    Mental disorder    schizoaffective   Renal cell carcinoma ([place])     SURGICAL HISTORY: Past Surgical History:  Procedure Laterality Date   ANKLE FRACTURE SURGERY     COLONOSCOPY WITH PROPOFOL N/A 04/15/2020   Procedure: COLONOSCOPY WITH PROPOFOL;  Surgeon: Virgel Manifold, MD;  Location: [facility] ENDOSCOPY;  Service: Endoscopy;  Laterality: N/A;   CRANIOTOMY Left 10/27/2013   Procedure: Craniotomy for Aneurysm Clipping;  Surgeon: Consuella Lose, MD;  Location: [facility];  Service: Neurosurgery;  Laterality: Left;   IR RADIOLOGIST EVAL & MGMT  11/22/2020   IR RADIOLOGIST EVAL & MGMT  12/21/2020   KNEE SURGERY     due to fracture.    ROBOTIC ASSITED PARTIAL NEPHRECTOMY Left 08/10/2019   Procedure: XI ROBOTIC ASSITED LAPAROSCOPIC  PARTIAL NEPHRECTOMY;  Surgeon: Raynelle Bring, MD;  Location: [facility];  Service: Urology;  Laterality: Left;    SOCIAL HISTORY: Social History   Socioeconomic History   Marital status: Single    Spouse name: Not on file   Number of children: 0   Years of education: Not on file   Highest education level: Not on file  Occupational History   Not on file  Tobacco Use   Smoking status: Every Day    Packs/day: 0.50    Years: 48.00    Pack years: 24.00    Types: Cigarettes   Smokeless tobacco: Never  Vaping Use   Vaping Use: Never used  Substance and Sexual Activity   Alcohol use: Not Currently   Drug use: Never   Sexual activity: Not Currently  Other Topics Concern   Not on file  Social History Narrative    Not on file   Social Determinants of Health   Financial Resource Strain: Not on file  Food Insecurity: Not on file  Transportation Needs: Not on file  Physical Activity: Not on file  Stress: Not on file  Social Connections: Not on file  Intimate Partner Violence: Not on file    FAMILY HISTORY: Family History  Problem Relation Age of Onset   Breast cancer Mother    Throat cancer Brother    Prostate cancer Neg Hx    Colon cancer Neg Hx    Pancreatic cancer Neg Hx     ALLERGIES:  has No Known Allergies.  MEDICATIONS:  Current Outpatient Medications  Medication Sig Dispense Refill   acetaminophen (TYLENOL) 650 MG CR tablet Take by mouth.     amLODipine-valsartan (EXFORGE) 10-320 MG tablet Take 1 tablet by mouth daily.     atorvastatin (LIPITOR) 10 MG tablet Take 1 tablet by mouth daily.     cabozantinib (CABOMETYX) 40 MG tablet Take 1 tablet (40 mg total) by mouth daily. Take on an empty stomach, 1 hour before or 2 hours after meals. 30 tablet 1   carvedilol (COREG) 25 MG tablet Take 25 mg by mouth 2 (two) times daily.     chlorthalidone (HYGROTON) 25 MG tablet Take 25 mg  by mouth daily.     fenofibrate (TRICOR) 145 MG tablet Take 145 mg by mouth daily.     furosemide (LASIX) 20 MG tablet Take 10 mg by mouth daily.     lamoTRIgine (LAMICTAL) 100 MG tablet Take 100 mg by mouth 2 (two) times daily.     NOVOLOG MIX 70/30 FLEXPEN (70-30) 100 UNIT/ML FlexPen Inject 30 Units as directed daily.     omeprazole (PRILOSEC) 40 MG capsule Take 40 mg by mouth daily.     ondansetron (ZOFRAN) 8 MG tablet Take 1 tablet (8 mg total) by mouth every 8 (eight) hours as needed for nausea or vomiting. 20 tablet 0   tamsulosin (FLOMAX) 0.4 MG CAPS capsule Take 0.4 mg by mouth daily.      ULTICARE PEN NEEDLES 29G X 12.7MM MISC      Valbenazine Tosylate (INGREZZA) 40 MG CAPS Take 40 mg by mouth daily.      ziprasidone (GEODON) 80 MG capsule Take 80 mg by mouth 2 (two) times daily.     No current  facility-administered medications for this visit.     PHYSICAL EXAMINATION: ECOG PERFORMANCE STATUS: 1 - Symptomatic but completely ambulatory Vitals:   02/10/21 1138  BP: (!) 153/93  Pulse: (!) 56  Resp: 16  Temp: (!) 96.3 F (35.7 C)  SpO2: 99%   Filed Weights   02/10/21 1138  Weight: 231 lb 12.8 oz (105.1 kg)    Physical Exam Constitutional:      General: He is not in acute distress. HENT:     Head: Normocephalic and atraumatic.  Eyes:     General: No scleral icterus. Cardiovascular:     Rate and Rhythm: Normal rate and regular rhythm.     Heart sounds: Normal heart sounds.  Pulmonary:     Effort: Pulmonary effort is normal. No respiratory distress.     Breath sounds: No wheezing.  Abdominal:     General: Bowel sounds are normal. There is no distension.     Palpations: Abdomen is soft.  Musculoskeletal:        General: No deformity. Normal range of motion.     Cervical back: Normal range of motion and neck supple.  Skin:    General: Skin is warm and dry.     Findings: No erythema or rash.  Neurological:     Mental Status: He is alert and oriented to person, place, and time. Mental status is at baseline.     Comments: Patient talks slowly  Psychiatric:        Mood and Affect: Mood normal.    LABORATORY DATA:  I have reviewed the data as listed Lab Results  Component Value Date   WBC 4.1 02/10/2021   HGB 13.3 02/10/2021   HCT 38.9 (L) 02/10/2021   MCV 90.5 02/10/2021   PLT 248 02/10/2021   Recent Labs    01/06/21 0959 01/18/21 1101 02/10/21 1121  NA 143 142 140  K 3.7 4.0 3.7  CL 110 108 104  CO2 _0 GLUCOSE 97 108* 147*  BUN 29* 32* 33*  CREATININE 3.15* 2.89* 2.94*  CALCIUM 8.8* 8.7* 8.7*  GFRNONAA 20* 23* 22*  PROT 6.8 6.9 7.2  ALBUMIN 3.7 3.8 3.8  AST _1 ALT _2 ALKPHOS 54 57 69  BILITOT 0.5 0.4 0.5    Iron/TIBC/Ferritin/ %Sat No results found for: IRON, TIBC, FERRITIN, IRONPCTSAT    RADIOGRAPHIC STUDIES: I  have personally reviewed the  radiological images as listed and agreed with the findings in the report. CT ABDOMEN WO CONTRAST  Result Date: 12/15/2020 CLINICAL DATA:  Pre embolization planning. History of papillary renal cell carcinoma. EXAM: CT ABDOMEN WITHOUT CONTRAST TECHNIQUE: Multidetector CT imaging of the abdomen was performed following the standard protocol without IV contrast. COMPARISON:  MRI of October 10, 2020 FINDINGS: Lower chest: Lung bases are clear.  No effusion.  No consolidation. Hepatobiliary: Liver with smooth contours. Signs of hepatic cysts seen on recent MRI. Cholelithiasis. No pericholecystic stranding. Pancreas: Pancreas without signs of ductal dilation grossly or evidence of inflammation. Spleen: Spleen normal size and contour. Adrenals/Urinary Tract: Adrenal glands are normal. Cysts arising from the RIGHT kidney are stable grossly on this noncontrast assessment. Central area of added density in the lower renal sinus fat following partial nephrectomy measures approximately 2 cm this is in the area of concern raised on the previous MRI. In the adjacent retroperitoneum there are soft tissue nodules measuring between 5 and 7 mm best seen on image 38 of series 2. On image 39 of series 2 there is a nodular density along postoperative plane measuring 10 mm Bulky para-aortic adenopathy in the LEFT retroperitoneum 2.5 cm short axis lymph node along the LEFT periaortic chain previously 2.0 cm. No hydronephrosis.  No perinephric stranding. Stomach/Bowel: Small hiatal hernia. No acute gastrointestinal process. Colonic diverticulosis. Bowel incompletely imaged on this abdominal CT. Appendix is normal. Vascular/Lymphatic: Calcified atheromatous plaque in the abdominal aorta without aneurysmal dilation. Smooth contour of the IVC. Adenopathy in the retroperitoneum as described. Other: Nodular changes in the LEFT retroperitoneum. Also small nodules or lymph nodes present along the retroperitoneum or  potentially within the sigmoid mesentery measuring between 5 and 6 mm seen on image 49 in 51 respectively with other small nodules or lymph nodes in this space. Most suspicious areas are along the lateral and posterior LEFT flank in the posterior pararenal space and along the transversalis fascia on image 38 described in the renal section. Musculoskeletal: No acute musculoskeletal process. No destructive bone finding. Spinal degenerative changes. IMPRESSION: 1. Nodular changes about the LEFT retroperitoneum in the surgical bed suspicious for areas of disease recurrence in the LEFT retroperitoneum and along the LEFT flank. 2. Signs of increased soft tissue density about the inferior LEFT renal sinus fat pole following partial nephrectomy again suspicious for recurrent disease locally. 3. Other scattered small nodules seen inferiorly along the plane of the sigmoid mesocolon not present on preoperative evaluations may serve as additional evidence of disease. 4. Cholelithiasis. 5. Colonic diverticulosis. 6. Small hiatal hernia. 7. Aortic atherosclerosis. These results will be called to the ordering clinician or representative by the Radiologist Assistant, and communication documented in the PACS or [employer]. Electronically Signed   By: Zetta Bills M.D.   On: 12/15/2020 14:24   CT CHEST ABDOMEN PELVIS WO CONTRAST  Result Date: 12/31/2020 CLINICAL DATA:  Partial left nephrectomy 08/10/2019 for renal cell carcinoma. Suspected local recurrence and metastatic disease on recent imaging. Restaging. EXAM: CT CHEST, ABDOMEN AND PELVIS WITHOUT CONTRAST TECHNIQUE: Multidetector CT imaging of the chest, abdomen and pelvis was performed following the standard protocol without IV contrast. COMPARISON:  12/15/2020 unenhanced CT abdomen. 10/10/2020 MRI abdomen. 09/26/2020 chest CT. FINDINGS: CT CHEST FINDINGS Cardiovascular: Normal heart size. No significant pericardial effusion/thickening. Three-vessel coronary  atherosclerosis. Atherosclerotic nonaneurysmal thoracic aorta. Normal caliber pulmonary arteries. Mediastinum/Nodes: Stable hypodense 1.9 cm anterior right thyroid nodule. Unremarkable esophagus. No pathologically enlarged axillary, mediastinal or hilar lymph nodes, noting limited sensitivity  for the detection of hilar adenopathy on this noncontrast study. Lungs/Pleura: No pneumothorax. No pleural effusion. Moderate to severe centrilobular and paraseptal emphysema with diffuse bronchial wall thickening. Patchy clustered solid lobulated pulmonary nodules in posterior right lower lobe, largest 1.2 cm (series 4/image 76), new. No acute consolidative airspace disease or lung masses. Two right middle lobe tiny pulmonary nodules, largest 0.3 cm (series 4/image 73), both stable. Musculoskeletal: No aggressive appearing focal osseous lesions. Moderate thoracic spondylosis. CT ABDOMEN PELVIS FINDINGS Hepatobiliary: Normal liver size. A few scattered tiny subcentimeter hypodense liver lesions are too small to characterize, are unchanged from prior MRI studies, where they were characterized as benign liver cysts. No appreciable new liver lesions. Cholelithiasis. No biliary ductal dilatation. Pancreas: Normal, with no mass or duct dilation. Spleen: Normal size. No mass. Adrenals/Urinary Tract: No discrete adrenal nodules. No renal stones. No hydronephrosis. Solid 2.7 x 2.1 cm mass in the lower left kidney partial nephrectomy bed (series 2/image 73), previously 2.6 x 1.8 cm on 12/15/2020 CT, slightly increased. Several simple renal cysts scattered in both kidneys, largest 3.6 cm in the lower right kidney. Scattered small soft tissue nodules throughout the left retroperitoneum as previously detailed, not substantially changed from recent unenhanced CT, for example measuring 1.0 cm near the descending colon (series 2/image 74), 0.8 cm in the lateral fat (series 2/image 73) and 0.7 cm more inferiorly (series 2/image 88). Normal  bladder. Stomach/Bowel: Normal non-distended stomach. Normal caliber small bowel with no small bowel wall thickening. Normal appendix. Moderate diffuse colonic diverticulosis with no large bowel wall thickening or significant pericolonic fat stranding. Vascular/Lymphatic: Atherosclerotic nonaneurysmal abdominal aorta. Enlarged 2.7 cm short axis diameter left periaortic node (series 2/image 72), slightly increased from 2.6 cm. No additional sites of adenopathy. Reproductive: Normal size prostate. Other: No pneumoperitoneum, ascites or focal fluid collection. Musculoskeletal: No aggressive appearing focal osseous lesions. Marked lumbar spondylosis. IMPRESSION: 1. Enlarging locally recurrent solid 2.7 cm neoplasm in the lower left kidney partial nephrectomy bed. 2. Enlarging left para-aortic nodal metastasis. 3. Scattered small soft tissue nodules throughout the left retroperitoneum are not substantially changed from recent unenhanced CT studies, suspicious for scattered left retroperitoneal metastases. 4. No findings highly suspicious for metastatic disease in the chest. 5. Patchy clustered solid lobulated pulmonary nodules in the posterior right lower lobe, largest 1.2 cm, new, indeterminate, favor infectious or inflammatory etiology. Two tiny right middle lobe nodules up to 0.3 cm are unchanged. Follow-up chest CT suggested in 3 months. 6. Moderate colonic diverticulosis. 7. Three-vessel coronary atherosclerosis. 8. Aortic Atherosclerosis (ICD10-I70.0) and Emphysema (ICD10-J43.9). Electronically Signed   By: Ilona Sorrel M.D.   On: 12/31/2020 09:49   IR Radiologist Eval & Mgmt  Result Date: 12/21/2020 Please refer to notes tab for details about interventional procedure. (Op Note)  IR Radiologist Eval & Mgmt  Result Date: 11/22/2020 Please refer to notes tab for details about interventional procedure. (Op Note)       ASSESSMENT & PLAN:  1. Cancer of kidney, left ([place])   2. Encounter for antineoplastic  chemotherapy   3. Stage 4 chronic kidney disease ([place])   4. Primary hypertension   Cancer Staging Cancer of kidney, left [name]) Staging form: Kidney, AJCC 8th Edition - Clinical stage from 11/28/2020: Stage III (rcT1a, cN1, cM0) - Signed by Earlie Server, MD on 11/28/2020   Recurrent left papillary renal cell carcinoma, with nodal involvement- SBRT 11/2020 by Dr.Manning. Patient declines surgery.  Labs are reviewed and discussed with patient Continue cabozantinib 40 mg-palliative intent  Hypertension, continue follow-up with primary care provider.  Elevation of blood pressure is likely due to cabozantinib side effects. CKD stage IV, Encourage oral hydration. Check multiple myeloma panel at next vist Anemia due to CKD, stable Hb  Follow up in 3 weeks.   Orders Placed This Encounter  Procedures   CBC with Differential/Platelet    Standing Status:   Future    Standing Expiration Date:   02/10/2022   Comprehensive metabolic panel    Standing Status:   Future    Standing Expiration Date:   02/10/2022   Lactate dehydrogenase    Standing Status:   Future    Standing Expiration Date:   02/10/2022    All questions were answered. The patient knows to call the clinic with any problems questions or concerns.  cc Jodi Marble, MD    Return of visit: 4 weeks  Earlie Server, MD, PhD Hematology Oncology [facility] Pager- [phone] 02/10/2021

## 2021-02-13 LAB — PROTEIN ELECTRO, RANDOM URINE
Albumin ELP, Urine: 81.4 %
Alpha-1-Globulin, U: 1.2 %
Alpha-2-Globulin, U: 4 %
Beta Globulin, U: 8.5 %
Gamma Globulin, U: 4.9 %
Total Protein, Urine: 246.5 mg/dL

## 2021-02-13 LAB — KAPPA/LAMBDA LIGHT CHAINS
Kappa free light chain: 50.1 mg/L — ABNORMAL HIGH (ref 3.3–19.4)
Kappa, lambda light chain ratio: 1.86 — ABNORMAL HIGH (ref 0.26–1.65)
Lambda free light chains: 27 mg/L — ABNORMAL HIGH (ref 5.7–26.3)

## 2021-02-14 DIAGNOSIS — H2512 Age-related nuclear cataract, left eye: Secondary | ICD-10-CM | POA: Diagnosis not present

## 2021-02-14 LAB — MULTIPLE MYELOMA PANEL, SERUM
Albumin SerPl Elph-Mcnc: 3.7 g/dL (ref 2.9–4.4)
Albumin/Glob SerPl: 1.3 (ref 0.7–1.7)
Alpha 1: 0.2 g/dL (ref 0.0–0.4)
Alpha2 Glob SerPl Elph-Mcnc: 0.7 g/dL (ref 0.4–1.0)
B-Globulin SerPl Elph-Mcnc: 1.2 g/dL (ref 0.7–1.3)
Gamma Glob SerPl Elph-Mcnc: 0.8 g/dL (ref 0.4–1.8)
Globulin, Total: 2.9 g/dL (ref 2.2–3.9)
IgA: 215 mg/dL (ref 61–437)
IgG (Immunoglobin G), Serum: 899 mg/dL (ref 603–1613)
IgM (Immunoglobulin M), Srm: 74 mg/dL (ref 20–172)
Total Protein ELP: 6.6 g/dL (ref 6.0–8.5)

## 2021-02-20 ENCOUNTER — Telehealth: Payer: Self-pay | Admitting: Podiatry

## 2021-02-20 NOTE — Telephone Encounter (Signed)
Pt called checking on diabetic shoes status.  I explained that we did get the documents needed and that they should be shipping any time that the manufacturer moved the warehouse so there has been a little delay but he would get a call when they come in to schedule an appt to pick them up.

## 2021-02-23 ENCOUNTER — Encounter: Payer: Self-pay | Admitting: Ophthalmology

## 2021-02-23 ENCOUNTER — Other Ambulatory Visit: Payer: Self-pay

## 2021-02-23 ENCOUNTER — Ambulatory Visit (INDEPENDENT_AMBULATORY_CARE_PROVIDER_SITE_OTHER): Payer: Medicare Other | Admitting: Podiatry

## 2021-02-23 ENCOUNTER — Encounter: Payer: Self-pay | Admitting: Podiatry

## 2021-02-23 DIAGNOSIS — M79674 Pain in right toe(s): Secondary | ICD-10-CM | POA: Diagnosis not present

## 2021-02-23 DIAGNOSIS — B351 Tinea unguium: Secondary | ICD-10-CM | POA: Diagnosis not present

## 2021-02-23 DIAGNOSIS — E0843 Diabetes mellitus due to underlying condition with diabetic autonomic (poly)neuropathy: Secondary | ICD-10-CM

## 2021-02-23 DIAGNOSIS — M79675 Pain in left toe(s): Secondary | ICD-10-CM | POA: Diagnosis not present

## 2021-02-23 DIAGNOSIS — N183 Chronic kidney disease, stage 3 unspecified: Secondary | ICD-10-CM

## 2021-02-23 NOTE — Progress Notes (Signed)
This patient returns to my office for at risk foot care.  This patient requires this care by a professional since this patient will be at risk due to having type 2 diabetes and stage 3 kidney disease  This patient is unable to cut nails himself since the patient cannot reach his nails.These nails are painful walking and wearing shoes. Painful callus both feet.  This patient presents for at risk foot care today.  General Appearance  Alert, conversant and in no acute stress.  Vascular  Dorsalis pedis and posterior tibial  pulses are palpable  bilaterally.  Capillary return is within normal limits  bilaterally. Temperature is within normal limits  bilaterally.  Neurologic  Senn-Weinstein monofilament wire test absent bilaterally. Muscle power within normal limits bilaterally.  Nails Thick disfigured discolored nails with subungual debris  from second to fifth toes bilaterally. No evidence of bacterial infection or drainage bilaterally.  Orthopedic  No limitations of motion  feet .  No crepitus or effusions noted.  No bony pathology or digital deformities noted.  HAV  B/L.  Hammer toes  B/L.  Skin  normotropic skin with no porokeratosis noted bilaterally.  No signs of infections or ulcers noted.   Callus 1st MPJ and callus under right hallux.  Onychomycosis  Pain in right toes  Pain in left toes  Callus  B/L  Consent was obtained for treatment procedures.   Mechanical debridement of nails 1-5  bilaterally performed with a nail nipper.  Filed with dremel without incident.  Debride callus with # 15 blade followed by dremel tool.     Return office visit   3 months                  Told patient to return for periodic foot care and evaluation due to potential at risk complications.   Gardiner Barefoot DPM

## 2021-02-28 ENCOUNTER — Other Ambulatory Visit: Payer: Self-pay | Admitting: Urology

## 2021-02-28 ENCOUNTER — Other Ambulatory Visit: Payer: Self-pay | Admitting: Oncology

## 2021-02-28 ENCOUNTER — Other Ambulatory Visit (HOSPITAL_COMMUNITY): Payer: Self-pay

## 2021-02-28 DIAGNOSIS — C642 Malignant neoplasm of left kidney, except renal pelvis: Secondary | ICD-10-CM

## 2021-02-28 DIAGNOSIS — E113591 Type 2 diabetes mellitus with proliferative diabetic retinopathy without macular edema, right eye: Secondary | ICD-10-CM | POA: Diagnosis not present

## 2021-02-28 MED ORDER — CABOMETYX 40 MG PO TABS
40.0000 mg | ORAL_TABLET | Freq: Every day | ORAL | 1 refills | Status: DC
  Filled 2021-02-28: qty 30, 30d supply, fill #0
  Filled 2021-03-28: qty 30, 30d supply, fill #1

## 2021-03-01 ENCOUNTER — Other Ambulatory Visit (HOSPITAL_COMMUNITY): Payer: Self-pay

## 2021-03-02 ENCOUNTER — Other Ambulatory Visit (HOSPITAL_COMMUNITY): Payer: Self-pay

## 2021-03-03 DIAGNOSIS — E119 Type 2 diabetes mellitus without complications: Secondary | ICD-10-CM | POA: Diagnosis not present

## 2021-03-03 DIAGNOSIS — C649 Malignant neoplasm of unspecified kidney, except renal pelvis: Secondary | ICD-10-CM | POA: Diagnosis not present

## 2021-03-03 DIAGNOSIS — N184 Chronic kidney disease, stage 4 (severe): Secondary | ICD-10-CM | POA: Diagnosis not present

## 2021-03-07 NOTE — Anesthesia Preprocedure Evaluation (Addendum)
Anesthesia Evaluation  Patient identified by MRN, date of birth, ID band Patient awake    Reviewed: Allergy & Precautions, NPO status , Patient's Chart, lab work & pertinent test results  History of Anesthesia Complications Negative for: history of anesthetic complications  Airway Mallampati: III   Neck ROM: Full    Dental  (+) Edentulous Upper, Edentulous Lower   Pulmonary Current Smoker (smokes 1 ppd) and Patient abstained from smoking.,    Pulmonary exam normal breath sounds clear to auscultation       Cardiovascular hypertension, Normal cardiovascular exam Rhythm:Regular Rate:Normal     Neuro/Psych PSYCHIATRIC DISORDERS (schizoaffective disorder) 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    GI/Hepatic negative GI ROS,   Endo/Other  diabetes, Type 2, Insulin Dependent  Renal/GU Renal disease (stage IV CKD, RCC)     Musculoskeletal   Abdominal   Peds  Hematology negative hematology ROS (+)   Anesthesia Other Findings   Reproductive/Obstetrics                            Anesthesia Physical Anesthesia Plan  ASA: 3  Anesthesia Plan: MAC   Post-op Pain Management:    Induction: Intravenous  PONV Risk Score and Plan: 0 and TIVA, Midazolam and Treatment may vary due to age or medical condition  Airway Management Planned: Nasal Cannula  Additional Equipment:   Intra-op Plan:   Post-operative Plan:   Informed Consent: I have reviewed the patients History and Physical, chart, labs and discussed the procedure including the risks, benefits and alternatives for the proposed anesthesia with the patient or authorized representative who has indicated his/her understanding and acceptance.       Plan Discussed with: CRNA  Anesthesia Plan Comments:        Anesthesia Quick Evaluation

## 2021-03-08 ENCOUNTER — Ambulatory Visit: Payer: Medicare Other | Admitting: Anesthesiology

## 2021-03-08 ENCOUNTER — Ambulatory Visit
Admission: RE | Admit: 2021-03-08 | Discharge: 2021-03-08 | Disposition: A | Discharge status: Home / Self Care | Payer: Medicare Other | Attending: Ophthalmology | Admitting: Ophthalmology

## 2021-03-08 ENCOUNTER — Other Ambulatory Visit: Payer: Self-pay

## 2021-03-08 ENCOUNTER — Encounter
Admission: RE | Disposition: A | Discharge status: Home / Self Care | Payer: Self-pay | Source: Home / Self Care | Attending: Ophthalmology

## 2021-03-08 ENCOUNTER — Encounter: Payer: Self-pay | Admitting: Ophthalmology

## 2021-03-08 DIAGNOSIS — E1136 Type 2 diabetes mellitus with diabetic cataract: Secondary | ICD-10-CM | POA: Diagnosis not present

## 2021-03-08 DIAGNOSIS — H2512 Age-related nuclear cataract, left eye: Secondary | ICD-10-CM | POA: Insufficient documentation

## 2021-03-08 DIAGNOSIS — F17210 Nicotine dependence, cigarettes, uncomplicated: Secondary | ICD-10-CM | POA: Diagnosis not present

## 2021-03-08 DIAGNOSIS — Z85528 Personal history of other malignant neoplasm of kidney: Secondary | ICD-10-CM | POA: Diagnosis not present

## 2021-03-08 DIAGNOSIS — Z79899 Other long term (current) drug therapy: Secondary | ICD-10-CM | POA: Insufficient documentation

## 2021-03-08 DIAGNOSIS — Z794 Long term (current) use of insulin: Secondary | ICD-10-CM | POA: Insufficient documentation

## 2021-03-08 DIAGNOSIS — H25812 Combined forms of age-related cataract, left eye: Secondary | ICD-10-CM | POA: Diagnosis not present

## 2021-03-08 HISTORY — DX: Presence of dental prosthetic device (complete) (partial): Z97.2

## 2021-03-08 HISTORY — PX: CATARACT EXTRACTION W/PHACO: SHX586

## 2021-03-08 LAB — GLUCOSE, CAPILLARY
Glucose-Capillary: 144 mg/dL — ABNORMAL HIGH (ref 70–99)
Glucose-Capillary: 142 mg/dL — ABNORMAL HIGH (ref 70–99)

## 2021-03-08 SURGERY — PHACOEMULSIFICATION, CATARACT, WITH IOL INSERTION
Anesthesia: Monitor Anesthesia Care | Site: Eye | Laterality: Left

## 2021-03-08 MED ORDER — PHENYLEPHRINE HCL 10 % OP SOLN
1.0000 [drp] | OPHTHALMIC | Status: DC | PRN
  Administered 2021-03-08 (×3): 1 [drp] via OPHTHALMIC

## 2021-03-08 MED ORDER — BRIMONIDINE TARTRATE-TIMOLOL 0.2-0.5 % OP SOLN
OPHTHALMIC | Status: DC | PRN
  Administered 2021-03-08: 1 [drp] via OPHTHALMIC

## 2021-03-08 MED ORDER — TETRACAINE HCL 0.5 % OP SOLN
1.0000 [drp] | OPHTHALMIC | Status: DC | PRN
  Administered 2021-03-08 (×3): 1 [drp] via OPHTHALMIC

## 2021-03-08 MED ORDER — SIGHTPATH DOSE#1 NA HYALUR & NA CHOND-NA HYALUR IO KIT
PACK | INTRAOCULAR | Status: DC | PRN
  Administered 2021-03-08: 1 via OPHTHALMIC

## 2021-03-08 MED ORDER — LACTATED RINGERS IV SOLN: INTRAVENOUS | Status: DC

## 2021-03-08 MED ORDER — SIGHTPATH DOSE#1 BSS IO SOLN
INTRAOCULAR | Status: DC | PRN
  Administered 2021-03-08: 73 mL via OPHTHALMIC

## 2021-03-08 MED ORDER — ONDANSETRON HCL 4 MG/2ML IJ SOLN: 4.0000 mg | Freq: Once | INTRAMUSCULAR | Status: DC | PRN

## 2021-03-08 MED ORDER — ACETAMINOPHEN 325 MG PO TABS: 650.0000 mg | ORAL_TABLET | Freq: Once | ORAL | Status: DC | PRN

## 2021-03-08 MED ORDER — MIDAZOLAM HCL 2 MG/2ML IJ SOLN
INTRAMUSCULAR | Status: DC | PRN
  Administered 2021-03-08: 1 mg via INTRAVENOUS

## 2021-03-08 MED ORDER — ACETAMINOPHEN 160 MG/5ML PO SOLN: 325.0000 mg | ORAL | Status: DC | PRN

## 2021-03-08 MED ORDER — CYCLOPENTOLATE HCL 2 % OP SOLN
1.0000 [drp] | OPHTHALMIC | Status: DC | PRN
  Administered 2021-03-08 (×3): 1 [drp] via OPHTHALMIC

## 2021-03-08 MED ORDER — FENTANYL CITRATE (PF) 100 MCG/2ML IJ SOLN
INTRAMUSCULAR | Status: DC | PRN
  Administered 2021-03-08: 50 ug via INTRAVENOUS

## 2021-03-08 MED ORDER — LIDOCAINE HCL (PF) 2 % IJ SOLN
INTRAOCULAR | Status: DC | PRN
  Administered 2021-03-08: 1 mL

## 2021-03-08 MED ORDER — CEFUROXIME OPHTHALMIC INJECTION 1 MG/0.1 ML
INJECTION | OPHTHALMIC | Status: DC | PRN
  Administered 2021-03-08: 0.1 mL via INTRACAMERAL

## 2021-03-08 SURGICAL SUPPLY — 15 items
CANNULA ANT/CHMB 27GA (MISCELLANEOUS) ×2 IMPLANT
GLOVE SURG ENC TEXT LTX SZ7.5 (GLOVE) ×2 IMPLANT
GLOVE SURG TRIUMPH 8.0 PF LTX (GLOVE) ×2 IMPLANT
GOWN STRL REUS W/ TWL LRG LVL3 (GOWN DISPOSABLE) ×2 IMPLANT
GOWN STRL REUS W/TWL LRG LVL3 (GOWN DISPOSABLE) ×4
LENS IOL TECNIS EYHANCE 18.0 (Intraocular Lens) ×2 IMPLANT
MARKER SKIN DUAL TIP RULER LAB (MISCELLANEOUS) ×2 IMPLANT
NEEDLE CAPSULORHEX 25GA (NEEDLE) ×2 IMPLANT
NEEDLE FILTER BLUNT 18X 1/2SAF (NEEDLE) ×2
NEEDLE FILTER BLUNT 18X1 1/2 (NEEDLE) ×2 IMPLANT
PACK EYE AFTER SURG (MISCELLANEOUS) ×2 IMPLANT
SYR 3ML LL SCALE MARK (SYRINGE) ×4 IMPLANT
SYR TB 1ML LUER SLIP (SYRINGE) ×2 IMPLANT
WATER STERILE IRR 250ML POUR (IV SOLUTION) ×2 IMPLANT
WIPE NON LINTING 3.25X3.25 (MISCELLANEOUS) ×2 IMPLANT

## 2021-03-08 NOTE — H&P (Signed)
[facility]   Primary Care Physician:  Jodi Marble, MD Ophthalmologist: Dr. Leandrew Koyanagi  Pre-Procedure History & Physical: HPI:  Riley Stuart is a 70 y.o. male here for ophthalmic surgery.   Past Medical History:  Diagnosis Date   Chronic kidney disease    renal insufficiency   Diabetes mellitus without complication ([name])    Pt takes Insulin   Hypertension    Mental disorder    schizoaffective   Renal cell carcinoma ([name])    Wears dentures    full upper and lower    Past Surgical History:  Procedure Laterality Date   ANKLE FRACTURE SURGERY     COLONOSCOPY WITH PROPOFOL N/A 04/15/2020   Procedure: COLONOSCOPY WITH PROPOFOL;  Surgeon: Virgel Manifold, MD;  Location: [facility] ENDOSCOPY;  Service: Endoscopy;  Laterality: N/A;   CRANIOTOMY Left 10/27/2013   Procedure: Craniotomy for Aneurysm Clipping;  Surgeon: Consuella Lose, MD;  Location: [place] [facility];  Service: Neurosurgery;  Laterality: Left;   IR RADIOLOGIST EVAL & MGMT  11/22/2020   IR RADIOLOGIST EVAL & MGMT  12/21/2020   KNEE SURGERY     due to fracture.    ROBOTIC ASSITED PARTIAL NEPHRECTOMY Left 08/10/2019   Procedure: XI ROBOTIC ASSITED LAPAROSCOPIC  PARTIAL NEPHRECTOMY;  Surgeon: Raynelle Bring, MD;  Location: [facility] ORS;  Service: Urology;  Laterality: Left;    Prior to Admission medications   Medication Sig Start Date End Date Taking? Authorizing Provider  acetaminophen (TYLENOL) 650 MG CR tablet Take by mouth.   Yes [provider]  amLODipine-valsartan (EXFORGE) 10-320 MG tablet Take 1 tablet by mouth daily. 02/06/21  Yes [provider]  atorvastatin (LIPITOR) 10 MG tablet Take 1 tablet by mouth daily. 12/28/20  Yes [provider]  cabozantinib (CABOMETYX) 40 MG tablet Take 1 tablet (40 mg total) by mouth daily. Take on an empty stomach, 1 hour before or 2 hours after meals. 02/28/21  Yes Earlie Server, MD  carvedilol (COREG) 25 MG tablet Take 25 mg by mouth 2  (two) times daily. 07/16/19  Yes [provider]  chlorthalidone (HYGROTON) 25 MG tablet Take 25 mg by mouth daily. 02/06/21  Yes [provider]  fenofibrate (TRICOR) 145 MG tablet Take 145 mg by mouth daily.   Yes [provider]  furosemide (LASIX) 20 MG tablet Take 10 mg by mouth daily. 07/21/19  Yes [provider]  lamoTRIgine (LAMICTAL) 100 MG tablet Take 100 mg by mouth 2 (two) times daily.   Yes [provider]  NOVOLOG MIX 70/30 FLEXPEN (70-30) 100 UNIT/ML FlexPen Inject 30 Units as directed daily. 06/24/19  Yes [provider]  omeprazole (PRILOSEC) 40 MG capsule Take 40 mg by mouth daily. 10/05/20  Yes [provider]  ondansetron (ZOFRAN) 8 MG tablet Take 1 tablet (8 mg total) by mouth every 8 (eight) hours as needed for nausea or vomiting. 01/09/21  Yes Darl Pikes, RPH-CPP  tamsulosin (FLOMAX) 0.4 MG CAPS capsule Take 0.4 mg by mouth daily.  02/06/19  Yes [provider]  Valbenazine Tosylate (INGREZZA) 40 MG CAPS Take 40 mg by mouth daily.    Yes [provider]  ziprasidone (GEODON) 80 MG capsule Take 80 mg by mouth 2 (two) times daily. 07/27/19  Yes [provider]  Flossie Buffy PEN NEEDLES 29G X 12.7MM [place]  11/02/19   [provider]    Allergies as of 12/19/2020   (No Known Allergies)    Family History  Problem  Relation Age of Onset   Breast cancer Mother    Throat cancer Brother    Prostate cancer Neg Hx    Colon cancer Neg Hx    Pancreatic cancer Neg Hx     Social History   Socioeconomic History   Marital status: Single    Spouse name: Not on file   Number of children: 0   Years of education: Not on file   Highest education level: Not on file  Occupational History   Not on file  Tobacco Use   Smoking status: Every Day    Packs/day: 0.50    Years: 48.00    Pack years: 24.00    Types: Cigarettes   Smokeless tobacco: Never  Vaping Use   Vaping Use: Never used   Substance and Sexual Activity   Alcohol use: Not Currently   Drug use: Never   Sexual activity: Not Currently  Other Topics Concern   Not on file  Social History Narrative   Not on file   Social Determinants of Health   Financial Resource Strain: Not on file  Food Insecurity: Not on file  Transportation Needs: Not on file  Physical Activity: Not on file  Stress: Not on file  Social Connections: Not on file  Intimate Partner Violence: Not on file    Review of Systems: See HPI, otherwise negative ROS  Physical Exam: BP (!) 155/97   Pulse (!) 53   Temp (!) 97.2 F (36.2 C) (Temporal)   Ht 6' (1.829 m)   Wt 103 kg   SpO2 96%   BMI 30.79 kg/m  General:   Alert,  pleasant and cooperative in NAD Head:  Normocephalic and atraumatic. Lungs:  Clear to auscultation.    Heart:  Regular rate and rhythm.   Impression/Plan: Riley Stuart is here for ophthalmic surgery.  Risks, benefits, limitations, and alternatives regarding ophthalmic surgery have been reviewed with the patient.  Questions have been answered.  All parties agreeable.   Leandrew Koyanagi, MD  03/08/2021, 8:15 AM

## 2021-03-08 NOTE — Anesthesia Procedure Notes (Signed)
Procedure Name: MAC Date/Time: 03/08/2021 8:52 AM Performed by: Jeannene Patella, CRNA Pre-anesthesia Checklist: Patient identified, Emergency Drugs available, Suction available, Timeout performed and Patient being monitored Patient Re-evaluated:Patient Re-evaluated prior to induction Oxygen Delivery Method: Nasal cannula Placement Confirmation: positive ETCO2

## 2021-03-08 NOTE — Anesthesia Postprocedure Evaluation (Signed)
Anesthesia Post Note  Patient: Riley Stuart  Procedure(s) Performed: CATARACT EXTRACTION PHACO AND INTRAOCULAR LENS PLACEMENT (IOC) LEFT DIABETIC 8.48 01:06.9 (Left: Eye)     Patient location during evaluation: PACU Anesthesia Type: MAC Level of consciousness: awake and alert, oriented and patient cooperative Pain management: pain level controlled Vital Signs Assessment: post-procedure vital signs reviewed and stable Respiratory status: spontaneous breathing, nonlabored ventilation and respiratory function stable Cardiovascular status: blood pressure returned to baseline and stable Postop Assessment: adequate PO intake Anesthetic complications: no   No notable events documented.  Darrin Nipper

## 2021-03-08 NOTE — Transfer of Care (Signed)
Immediate Anesthesia Transfer of Care Note  Patient: Riley Stuart  Procedure(s) Performed: CATARACT EXTRACTION PHACO AND INTRAOCULAR LENS PLACEMENT (IOC) LEFT DIABETIC 8.48 01:06.9 (Left: Eye)  Patient Location: PACU  Anesthesia Type: MAC  Level of Consciousness: awake, alert  and patient cooperative  Airway and Oxygen Therapy: Patient Spontanous Breathing and Patient connected to supplemental oxygen  Post-op Assessment: Post-op Vital signs reviewed, Patient's Cardiovascular Status Stable, Respiratory Function Stable, Patent Airway and No signs of Nausea or vomiting  Post-op Vital Signs: Reviewed and stable  Complications: No notable events documented.

## 2021-03-08 NOTE — Op Note (Signed)
  OPERATIVE NOTE  CRIT OBREMSKI [id number] 03/08/2021   PREOPERATIVE DIAGNOSIS:  Nuclear sclerotic cataract left eye. H25.12   POSTOPERATIVE DIAGNOSIS:    Nuclear sclerotic cataract left eye.     PROCEDURE:  Phacoemusification with posterior chamber intraocular lens placement of the left eye  Ultrasound time: Procedure(s) with comments: CATARACT EXTRACTION PHACO AND INTRAOCULAR LENS PLACEMENT (IOC) LEFT DIABETIC 8.48 01:06.9 (Left) - Diabetic - insulin  LENS:   Implant Name Type Inv. Item Serial No. Manufacturer Lot No. LRB No. Used Action  LENS IOL TECNIS EYHANCE 18.0 - [id number] Intraocular Lens LENS IOL TECNIS EYHANCE 18.0 [id number] JOHNSON   Left 1 Implanted      SURGEON:  [name], MD   ANESTHESIA:  Topical with tetracaine drops and 2% Xylocaine jelly, augmented with 1% preservative-free intracameral lidocaine.    COMPLICATIONS:  None.   DESCRIPTION OF PROCEDURE:  The patient was identified in the holding room and transported to the operating room and placed in the supine position under the operating microscope.  The left eye was identified as the operative eye and it was prepped and draped in the usual sterile ophthalmic fashion.   A 1 millimeter clear-corneal paracentesis was made at the 1:30 position.  0.5 ml of preservative-free 1% lidocaine was injected into the anterior chamber.  The anterior chamber was filled with Viscoat viscoelastic.  A 2.4 millimeter keratome was used to make a near-clear corneal incision at the 10:30 position.  .  A curvilinear capsulorrhexis was made with a cystotome and capsulorrhexis forceps.  Balanced salt solution was used to hydrodissect and hydrodelineate the nucleus.   Phacoemulsification was then used in stop and chop fashion to remove the lens nucleus and epinucleus.  The remaining cortex was then removed using the irrigation and aspiration handpiece. Provisc was then placed into the capsular bag to distend it for lens  placement.  A lens was then injected into the capsular bag.  The remaining viscoelastic was aspirated.   Wounds were hydrated with balanced salt solution.  The anterior chamber was inflated to a physiologic pressure with balanced salt solution.  No wound leaks were noted. Cefuroxime 0.1 ml of a 10mg /ml solution was injected into the anterior chamber for a dose of 1 mg of intracameral antibiotic at the completion of the case.   Timolol and Brimonidine drops were applied to the eye.  The patient was taken to the recovery room in stable condition without complications of anesthesia or surgery.  Riley Stuart 03/08/2021, 9:10 AM

## 2021-03-10 ENCOUNTER — Inpatient Hospital Stay (HOSPITAL_BASED_OUTPATIENT_CLINIC_OR_DEPARTMENT_OTHER): Payer: Medicare Other | Admitting: Oncology

## 2021-03-10 ENCOUNTER — Encounter: Payer: Self-pay | Admitting: Oncology

## 2021-03-10 ENCOUNTER — Other Ambulatory Visit: Payer: Self-pay

## 2021-03-10 ENCOUNTER — Encounter: Payer: Self-pay | Admitting: Ophthalmology

## 2021-03-10 ENCOUNTER — Inpatient Hospital Stay: Payer: Medicare Other | Attending: Oncology

## 2021-03-10 VITALS — BP 127/78 | HR 62 | Temp 97.2°F | Resp 18 | Wt 228.2 lb

## 2021-03-10 DIAGNOSIS — R197 Diarrhea, unspecified: Secondary | ICD-10-CM | POA: Diagnosis not present

## 2021-03-10 DIAGNOSIS — Z5111 Encounter for antineoplastic chemotherapy: Secondary | ICD-10-CM

## 2021-03-10 DIAGNOSIS — I10 Essential (primary) hypertension: Secondary | ICD-10-CM

## 2021-03-10 DIAGNOSIS — N184 Chronic kidney disease, stage 4 (severe): Secondary | ICD-10-CM | POA: Insufficient documentation

## 2021-03-10 DIAGNOSIS — I129 Hypertensive chronic kidney disease with stage 1 through stage 4 chronic kidney disease, or unspecified chronic kidney disease: Secondary | ICD-10-CM | POA: Insufficient documentation

## 2021-03-10 DIAGNOSIS — D631 Anemia in chronic kidney disease: Secondary | ICD-10-CM | POA: Insufficient documentation

## 2021-03-10 DIAGNOSIS — C642 Malignant neoplasm of left kidney, except renal pelvis: Secondary | ICD-10-CM | POA: Insufficient documentation

## 2021-03-10 LAB — CBC WITH DIFFERENTIAL/PLATELET
Abs Immature Granulocytes: 0.02 10*3/uL (ref 0.00–0.07)
Basophils Absolute: 0 10*3/uL (ref 0.0–0.1)
Basophils Relative: 0 %
Eosinophils Absolute: 0 10*3/uL (ref 0.0–0.5)
Eosinophils Relative: 0 %
HCT: 35.3 % — ABNORMAL LOW (ref 39.0–52.0)
Hemoglobin: 12.2 g/dL — ABNORMAL LOW (ref 13.0–17.0)
Immature Granulocytes: 0 %
Lymphocytes Relative: 24 %
Lymphs Abs: 1.2 10*3/uL (ref 0.7–4.0)
MCH: 31.7 pg (ref 26.0–34.0)
MCHC: 34.6 g/dL (ref 30.0–36.0)
MCV: 91.7 fL (ref 80.0–100.0)
Monocytes Absolute: 0.4 10*3/uL (ref 0.1–1.0)
Monocytes Relative: 7 %
Neutro Abs: 3.4 10*3/uL (ref 1.7–7.7)
Neutrophils Relative %: 69 %
Platelets: 243 10*3/uL (ref 150–400)
RBC: 3.85 MIL/uL — ABNORMAL LOW (ref 4.22–5.81)
RDW: 17.7 % — ABNORMAL HIGH (ref 11.5–15.5)
WBC: 4.9 10*3/uL (ref 4.0–10.5)
nRBC: 0 % (ref 0.0–0.2)

## 2021-03-10 LAB — COMPREHENSIVE METABOLIC PANEL
ALT: 16 U/L (ref 0–44)
AST: 21 U/L (ref 15–41)
Albumin: 3.5 g/dL (ref 3.5–5.0)
Alkaline Phosphatase: 47 U/L (ref 38–126)
Anion gap: 11 (ref 5–15)
BUN: 41 mg/dL — ABNORMAL HIGH (ref 8–23)
CO2: 24 mmol/L (ref 22–32)
Calcium: 8.6 mg/dL — ABNORMAL LOW (ref 8.9–10.3)
Chloride: 106 mmol/L (ref 98–111)
Creatinine, Ser: 3.88 mg/dL — ABNORMAL HIGH (ref 0.61–1.24)
GFR, Estimated: 16 mL/min — ABNORMAL LOW (ref >60)
Glucose, Bld: 155 mg/dL — ABNORMAL HIGH (ref 70–99)
Potassium: 3.4 mmol/L — ABNORMAL LOW (ref 3.5–5.1)
Sodium: 141 mmol/L (ref 135–145)
Total Bilirubin: 0.5 mg/dL (ref 0.3–1.2)
Total Protein: 7.2 g/dL (ref 6.5–8.1)

## 2021-03-10 LAB — LACTATE DEHYDROGENASE: LDH: 172 U/L (ref 98–192)

## 2021-03-10 MED ORDER — FUROSEMIDE 20 MG PO TABS: 10.0000 mg | ORAL_TABLET | Freq: Every day | ORAL | 0 refills | Status: DC

## 2021-03-10 MED ORDER — LOPERAMIDE HCL 2 MG PO TABS: 2.0000 mg | ORAL_TABLET | ORAL | 1 refills | Status: AC

## 2021-03-10 NOTE — Progress Notes (Signed)
Hematology/Oncology follow up note [facility] Telephone:([phone]) [phone] Fax:(336) [phone]   Patient Care Team: Jodi Marble, MD as PCP - General (Internal Medicine) Marden Noble, MD (Internal Medicine) Tyler Pita, MD as Consulting Physician (Radiation Oncology)  REFERRING PROVIDER: Jodi Marble, MD  CHIEF COMPLAINTS/REASON FOR VISIT:  Follow-up for treatment of recurrent RCC  HISTORY OF PRESENTING ILLNESS:   Riley Stuart is a  70 y.o.  male with PMH listed below was seen in consultation at the request of  Jodi Marble, MD  for evaluation of [name] Patient has schizoaffective disorder and lives in the group home.  He is quite functional at baseline and makes his own medical decision. 11/27/2018 CT abdomen pelvis without contrast showed solid-appearing mass of the lower pole of the left kidney measuring up to 6.8 cm concerning for RCC. 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 08/10/2019, patient underwent partial left nephrectomy and results showed papillary renal cell carcinoma, type II, nuclear grade 3.  Size 9 cm.  Tumor invades perirenal fat (pT3a). Patient went image surveillance. 10/10/2020 , MRI abdomen with and without contrast showed new 2.4 x 1.8 cm faintly enhancing nodularity along the left kidney lower pole partial nephrectomy sites.  Suspicious for local recurrence.  New pathologically enlarged left periaortic lymph node, highly suspicious for malignancy in this context.  Numerous bilateral renal cysts.:  Diverticulosis lumbar spondylosis and degenerative disease 1.4 gallstone in the gallbladder.  Per note, urology Dr. Alinda Money has offered patient definitive surgical resection as well as regional lymph node dissection however patient declined the surgery due to his chronic kidney insufficiency and potential need of requiring dialysis after radical nephrectomy.  12/09/2020 status post SBRT to the  periaortic lymph node radiation oncology Dr. Tammi Klippel .  He was also seen by Dr. Pascal Lux for possible cryoablation.  Dr. Pascal Lux recommend repeat CT abdomen which has been ordered.  # 12/15/2020, CT abdomen without contrast showed nodular changes of the left retroperitoneum in the surgical bed suspicious for areas of disease recurrence in the left retroperitoneum and along the left flank.  Signs of increased soft tissue density about the inferior left renal sinus fat bowel following partial nephrectomy. Other scattered small nodules seen inferiorly along the plane of the sigmoid mesocolon not present on preoperative evaluations may serve as additional evidence of disease.    # Initially there was plan for cryoablation.  Patient was evaluated by IR Dr. Pascal Lux, however due to the disease progression on recent CT, Dr. Pascal Lux does not feel that patient will benefit from cryoablation at this point.   01/20/2021, started on cabozantinib 40 mg daily.  INTERVAL HISTORY Riley Stuart is a 70 y.o. male who has above history reviewed by me today presents for follow up visit for management of recurrent RCC Problems and complaints are listed below: on cabozantinib 40 mg daily.  He reports no new complaints.  BP has been better controlled. He is on coreg, amlodipine -valsartan, chlorthialidone and lasix.  He reports intermittent diarrhea, and group home has given him "diarrhea solution" PRN, no details about the anti diarrhea medication. No nausea, vomiting.    Review of Systems  Constitutional:  Negative for appetite change, chills, fatigue, fever and unexpected weight change.  HENT:   Negative for hearing loss and voice change.   Eyes:  Negative for eye problems and icterus.  Respiratory:  Negative for chest tightness, cough and shortness of breath.   Cardiovascular:  Negative for chest  pain and leg swelling.  Gastrointestinal:  Negative for abdominal distention and abdominal pain.  Endocrine: Negative  for hot flashes.  Genitourinary:  Negative for difficulty urinating, dysuria and frequency.   Musculoskeletal:  Negative for arthralgias.  Skin:  Negative for itching and rash.  Neurological:  Negative for light-headedness and numbness.  Hematological:  Negative for adenopathy. Does not bruise/bleed easily.  Psychiatric/Behavioral:  Negative for confusion.    MEDICAL HISTORY:  Past Medical History:  Diagnosis Date   Chronic kidney disease    renal insufficiency   Diabetes mellitus without complication ([place])    Pt takes Insulin   Hypertension    Mental disorder    schizoaffective   Renal cell carcinoma ([place])    Wears dentures    full upper and lower    SURGICAL HISTORY: Past Surgical History:  Procedure Laterality Date   ANKLE FRACTURE SURGERY     CATARACT EXTRACTION W/PHACO Left 03/08/2021   Procedure: CATARACT EXTRACTION PHACO AND INTRAOCULAR LENS PLACEMENT ([place]) LEFT DIABETIC 8.48 01:06.9;  Surgeon: Leandrew Koyanagi, MD;  Location: [place];  Service: Ophthalmology;  Laterality: Left;  Diabetic - insulin   COLONOSCOPY WITH PROPOFOL N/A 04/15/2020   Procedure: COLONOSCOPY WITH PROPOFOL;  Surgeon: Virgel Manifold, MD;  Location: [facility] ENDOSCOPY;  Service: Endoscopy;  Laterality: N/A;   CRANIOTOMY Left 10/27/2013   Procedure: Craniotomy for Aneurysm Clipping;  Surgeon: Consuella Lose, MD;  Location: [facility];  Service: Neurosurgery;  Laterality: Left;   IR RADIOLOGIST EVAL & MGMT  11/22/2020   IR RADIOLOGIST EVAL & MGMT  12/21/2020   KNEE SURGERY     due to fracture.    ROBOTIC ASSITED PARTIAL NEPHRECTOMY Left 08/10/2019   Procedure: XI ROBOTIC ASSITED LAPAROSCOPIC  PARTIAL NEPHRECTOMY;  Surgeon: Raynelle Bring, MD;  Location: [facility];  Service: Urology;  Laterality: Left;    SOCIAL HISTORY: Social History   Socioeconomic History   Marital status: Single    Spouse name: Not on file   Number of children: 0   Years of education: Not on file   Highest  education level: Not on file  Occupational History   Not on file  Tobacco Use   Smoking status: Every Day    Packs/day: 0.50    Years: 48.00    Pack years: 24.00    Types: Cigarettes   Smokeless tobacco: Never  Vaping Use   Vaping Use: Never used  Substance and Sexual Activity   Alcohol use: Not Currently   Drug use: Never   Sexual activity: Not Currently  Other Topics Concern   Not on file  Social History Narrative   Not on file   Social Determinants of Health   Financial Resource Strain: Not on file  Food Insecurity: Not on file  Transportation Needs: Not on file  Physical Activity: Not on file  Stress: Not on file  Social Connections: Not on file  Intimate Partner Violence: Not on file    FAMILY HISTORY: Family History  Problem Relation Age of Onset   Breast cancer Mother    Throat cancer Brother    Prostate cancer Neg Hx    Colon cancer Neg Hx    Pancreatic cancer Neg Hx     ALLERGIES:  has No Known Allergies.  MEDICATIONS:  Current Outpatient Medications  Medication Sig Dispense Refill   acetaminophen (TYLENOL) 650 MG CR tablet Take by mouth.     amLODipine-valsartan (EXFORGE) 10-320 MG tablet Take 1 tablet by mouth daily.  atorvastatin (LIPITOR) 10 MG tablet Take 1 tablet by mouth daily.     cabozantinib (CABOMETYX) 40 MG tablet Take 1 tablet (40 mg total) by mouth daily. Take on an empty stomach, 1 hour before or 2 hours after meals. 30 tablet 1   carvedilol (COREG) 25 MG tablet Take 25 mg by mouth 2 (two) times daily.     chlorthalidone (HYGROTON) 25 MG tablet Take 25 mg by mouth daily.     fenofibrate (TRICOR) 145 MG tablet Take 145 mg by mouth daily.     lamoTRIgine (LAMICTAL) 100 MG tablet Take 100 mg by mouth 2 (two) times daily.     loperamide (IMODIUM A-D) 2 MG tablet Take 1 tablet (2 mg total) by mouth See admin instructions. Take 4mg  with the onset of diarrhea, then take 2mg  after every loose bowel movements. Maximum 16mg  per 24 hours. 90  tablet 1   NOVOLOG MIX 70/30 FLEXPEN (70-30) 100 UNIT/ML FlexPen Inject 30 Units as directed daily.     omeprazole (PRILOSEC) 40 MG capsule Take 40 mg by mouth daily.     ondansetron (ZOFRAN) 8 MG tablet Take 1 tablet (8 mg total) by mouth every 8 (eight) hours as needed for nausea or vomiting. 20 tablet 0   tamsulosin (FLOMAX) 0.4 MG CAPS capsule Take 0.4 mg by mouth daily.      ULTICARE PEN NEEDLES 29G X 12.7MM MISC      Valbenazine Tosylate (INGREZZA) 40 MG CAPS Take 40 mg by mouth daily.      ziprasidone (GEODON) 80 MG capsule Take 80 mg by mouth 2 (two) times daily.     furosemide (LASIX) 20 MG tablet Take 0.5 tablets (10 mg total) by mouth daily. 30 tablet 0   No current facility-administered medications for this visit.     PHYSICAL EXAMINATION: ECOG PERFORMANCE STATUS: 1 - Symptomatic but completely ambulatory Vitals:   03/10/21 1146  BP: 127/78  Pulse: 62  Resp: 18  Temp: (!) 97.2 F (36.2 C)  SpO2: 97%   Filed Weights   03/10/21 1146  Weight: 228 lb 3.2 oz (103.5 kg)    Physical Exam Constitutional:      General: He is not in acute distress. HENT:     Head: Normocephalic and atraumatic.  Eyes:     General: No scleral icterus. Cardiovascular:     Rate and Rhythm: Normal rate and regular rhythm.     Heart sounds: Normal heart sounds.  Pulmonary:     Effort: Pulmonary effort is normal. No respiratory distress.     Breath sounds: No wheezing.  Abdominal:     General: Bowel sounds are normal. There is no distension.     Palpations: Abdomen is soft.  Musculoskeletal:        General: No deformity. Normal range of motion.     Cervical back: Normal range of motion and neck supple.  Skin:    General: Skin is warm and dry.     Findings: No erythema or rash.  Neurological:     Mental Status: He is alert and oriented to person, place, and time. Mental status is at baseline.     Comments: Patient talks slowly  Psychiatric:        Mood and Affect: Mood normal.     LABORATORY DATA:  I have reviewed the data as listed Lab Results  Component Value Date   WBC 4.9 03/10/2021   HGB 12.2 (L) 03/10/2021   HCT 35.3 (L) 03/10/2021   MCV  91.7 03/10/2021   PLT 243 03/10/2021   Recent Labs    01/18/21 1101 02/10/21 1121 03/10/21 1132  NA 142 140 141  K 4.0 3.7 3.4*  CL 108 104 106  CO2 24 26 24   GLUCOSE 108* 147* 155*  BUN 32* 33* 41*  CREATININE 2.89* 2.94* 3.88*  CALCIUM 8.7* 8.7* 8.6*  GFRNONAA 23* 22* 16*  PROT 6.9 7.2 7.2  ALBUMIN 3.8 3.8 3.5  AST 17 22 21   ALT 13 20 16   ALKPHOS 57 69 47  BILITOT 0.4 0.5 0.5    Iron/TIBC/Ferritin/ %Sat No results found for: IRON, TIBC, FERRITIN, IRONPCTSAT    RADIOGRAPHIC STUDIES: I have personally reviewed the radiological images as listed and agreed with the findings in the report. CT ABDOMEN WO CONTRAST  Result Date: 12/15/2020 CLINICAL DATA:  Pre embolization planning. History of papillary renal cell carcinoma. EXAM: CT ABDOMEN WITHOUT CONTRAST TECHNIQUE: Multidetector CT imaging of the abdomen was performed following the standard protocol without IV contrast. COMPARISON:  MRI of October 10, 2020 FINDINGS: Lower chest: Lung bases are clear.  No effusion.  No consolidation. Hepatobiliary: Liver with smooth contours. Signs of hepatic cysts seen on recent MRI. Cholelithiasis. No pericholecystic stranding. Pancreas: Pancreas without signs of ductal dilation grossly or evidence of inflammation. Spleen: Spleen normal size and contour. Adrenals/Urinary Tract: Adrenal glands are normal. Cysts arising from the RIGHT kidney are stable grossly on this noncontrast assessment. Central area of added density in the lower renal sinus fat following partial nephrectomy measures approximately 2 cm this is in the area of concern raised on the previous MRI. In the adjacent retroperitoneum there are soft tissue nodules measuring between 5 and 7 mm best seen on image 38 of series 2. On image 39 of series 2 there is a  nodular density along postoperative plane measuring 10 mm Bulky para-aortic adenopathy in the LEFT retroperitoneum 2.5 cm short axis lymph node along the LEFT periaortic chain previously 2.0 cm. No hydronephrosis.  No perinephric stranding. Stomach/Bowel: Small hiatal hernia. No acute gastrointestinal process. Colonic diverticulosis. Bowel incompletely imaged on this abdominal CT. Appendix is normal. Vascular/Lymphatic: Calcified atheromatous plaque in the abdominal aorta without aneurysmal dilation. Smooth contour of the IVC. Adenopathy in the retroperitoneum as described. Other: Nodular changes in the LEFT retroperitoneum. Also small nodules or lymph nodes present along the retroperitoneum or potentially within the sigmoid mesentery measuring between 5 and 6 mm seen on image 49 in 51 respectively with other small nodules or lymph nodes in this space. Most suspicious areas are along the lateral and posterior LEFT flank in the posterior pararenal space and along the transversalis fascia on image 38 described in the renal section. Musculoskeletal: No acute musculoskeletal process. No destructive bone finding. Spinal degenerative changes. IMPRESSION: 1. Nodular changes about the LEFT retroperitoneum in the surgical bed suspicious for areas of disease recurrence in the LEFT retroperitoneum and along the LEFT flank. 2. Signs of increased soft tissue density about the inferior LEFT renal sinus fat pole following partial nephrectomy again suspicious for recurrent disease locally. 3. Other scattered small nodules seen inferiorly along the plane of the sigmoid mesocolon not present on preoperative evaluations may serve as additional evidence of disease. 4. Cholelithiasis. 5. Colonic diverticulosis. 6. Small hiatal hernia. 7. Aortic atherosclerosis. These results will be called to the ordering clinician or representative by the Radiologist Assistant, and communication documented in the PACS or [employer].  Electronically Signed   By: Zetta Bills M.D.   On: 12/15/2020 14:24  CT CHEST ABDOMEN PELVIS WO CONTRAST  Result Date: 12/31/2020 CLINICAL DATA:  Partial left nephrectomy 08/10/2019 for renal cell carcinoma. Suspected local recurrence and metastatic disease on recent imaging. Restaging. EXAM: CT CHEST, ABDOMEN AND PELVIS WITHOUT CONTRAST TECHNIQUE: Multidetector CT imaging of the chest, abdomen and pelvis was performed following the standard protocol without IV contrast. COMPARISON:  12/15/2020 unenhanced CT abdomen. 10/10/2020 MRI abdomen. 09/26/2020 chest CT. FINDINGS: CT CHEST FINDINGS Cardiovascular: Normal heart size. No significant pericardial effusion/thickening. Three-vessel coronary atherosclerosis. Atherosclerotic nonaneurysmal thoracic aorta. Normal caliber pulmonary arteries. Mediastinum/Nodes: Stable hypodense 1.9 cm anterior right thyroid nodule. Unremarkable esophagus. No pathologically enlarged axillary, mediastinal or hilar lymph nodes, noting limited sensitivity for the detection of hilar adenopathy on this noncontrast study. Lungs/Pleura: No pneumothorax. No pleural effusion. Moderate to severe centrilobular and paraseptal emphysema with diffuse bronchial wall thickening. Patchy clustered solid lobulated pulmonary nodules in posterior right lower lobe, largest 1.2 cm (series 4/image 76), new. No acute consolidative airspace disease or lung masses. Two right middle lobe tiny pulmonary nodules, largest 0.3 cm (series 4/image 73), both stable. Musculoskeletal: No aggressive appearing focal osseous lesions. Moderate thoracic spondylosis. CT ABDOMEN PELVIS FINDINGS Hepatobiliary: Normal liver size. A few scattered tiny subcentimeter hypodense liver lesions are too small to characterize, are unchanged from prior MRI studies, where they were characterized as benign liver cysts. No appreciable new liver lesions. Cholelithiasis. No biliary ductal dilatation. Pancreas: Normal, with no mass or duct  dilation. Spleen: Normal size. No mass. Adrenals/Urinary Tract: No discrete adrenal nodules. No renal stones. No hydronephrosis. Solid 2.7 x 2.1 cm mass in the lower left kidney partial nephrectomy bed (series 2/image 73), previously 2.6 x 1.8 cm on 12/15/2020 CT, slightly increased. Several simple renal cysts scattered in both kidneys, largest 3.6 cm in the lower right kidney. Scattered small soft tissue nodules throughout the left retroperitoneum as previously detailed, not substantially changed from recent unenhanced CT, for example measuring 1.0 cm near the descending colon (series 2/image 74), 0.8 cm in the lateral fat (series 2/image 73) and 0.7 cm more inferiorly (series 2/image 88). Normal bladder. Stomach/Bowel: Normal non-distended stomach. Normal caliber small bowel with no small bowel wall thickening. Normal appendix. Moderate diffuse colonic diverticulosis with no large bowel wall thickening or significant pericolonic fat stranding. Vascular/Lymphatic: Atherosclerotic nonaneurysmal abdominal aorta. Enlarged 2.7 cm short axis diameter left periaortic node (series 2/image 72), slightly increased from 2.6 cm. No additional sites of adenopathy. Reproductive: Normal size prostate. Other: No pneumoperitoneum, ascites or focal fluid collection. Musculoskeletal: No aggressive appearing focal osseous lesions. Marked lumbar spondylosis. IMPRESSION: 1. Enlarging locally recurrent solid 2.7 cm neoplasm in the lower left kidney partial nephrectomy bed. 2. Enlarging left para-aortic nodal metastasis. 3. Scattered small soft tissue nodules throughout the left retroperitoneum are not substantially changed from recent unenhanced CT studies, suspicious for scattered left retroperitoneal metastases. 4. No findings highly suspicious for metastatic disease in the chest. 5. Patchy clustered solid lobulated pulmonary nodules in the posterior right lower lobe, largest 1.2 cm, new, indeterminate, favor infectious or  inflammatory etiology. Two tiny right middle lobe nodules up to 0.3 cm are unchanged. Follow-up chest CT suggested in 3 months. 6. Moderate colonic diverticulosis. 7. Three-vessel coronary atherosclerosis. 8. Aortic Atherosclerosis (ICD10-I70.0) and Emphysema (ICD10-J43.9). Electronically Signed   By: Ilona Sorrel M.D.   On: 12/31/2020 09:49   IR Radiologist Eval & Mgmt  Result Date: 12/21/2020 Please refer to notes tab for details about interventional procedure. (Op Note)       ASSESSMENT &  PLAN:  1. Cancer of kidney, left ([name])   2. Stage 4 chronic kidney disease ([place])   3. Encounter for antineoplastic chemotherapy   4. Primary hypertension   5. Anemia due to stage 4 chronic kidney disease [facility])   Cancer Staging Cancer of kidney, left [facility]) Staging form: Kidney, AJCC 8th Edition - Clinical stage from 11/28/2020: Stage III (rcT1a, cN1, cM0) - Signed by Earlie Server, MD on 11/28/2020   Recurrent left papillary renal cell carcinoma, with nodal involvement- SBRT 11/2020 by Dr.Manning. Patient declines surgery.  Labs are reviewed and discussed with patient. Continue Cabozantinib 40mg  daily.  Consider to repeat CT scan in 4-6 weeks for evaluation of response.    Hypertension, continue follow-up with primary care provider.  Better controlled.   Diarrhea, possibly due to chemotherapy. Recommend imodium PRN as instructed. Rx sent.   AKI on CKD stage IV, Encourage oral hydration. SPEP negative, light chain ratio was slightly elevated, -expected in CKD. Cr has increased. Possible dehydration from diarrhea. IV NS 1L x 1 today. Repeat BMP in 1 week.  ? Secondary to new BP medication.Staff member from group home says she will contact his pcp. Patient has appt with Pcp next week.  If kidney function is persistently worse, consider obtain CT scan early for evaluation response.  Anemia due to CKD, stable Hb  Follow up in 5 weeks.   Orders Placed This Encounter  Procedures   CBC with  Differential/Platelet    Standing Status:   Future    Standing Expiration Date:   03/10/2022   Comprehensive metabolic panel    Standing Status:   Future    Standing Expiration Date:   2/33/0076   Basic metabolic panel    Standing Status:   Future    Standing Expiration Date:   03/10/2022    All questions were answered. The patient knows to call the clinic with any problems questions or concerns.  cc Jodi Marble, MD    Return of visit: 4 weeks  Earlie Server, MD, PhD Hematology Oncology [name] at [facility] Pager- [phone] 03/10/2021

## 2021-03-13 DIAGNOSIS — H2511 Age-related nuclear cataract, right eye: Secondary | ICD-10-CM | POA: Diagnosis not present

## 2021-03-14 DIAGNOSIS — E119 Type 2 diabetes mellitus without complications: Secondary | ICD-10-CM | POA: Diagnosis not present

## 2021-03-14 DIAGNOSIS — E785 Hyperlipidemia, unspecified: Secondary | ICD-10-CM | POA: Diagnosis not present

## 2021-03-14 DIAGNOSIS — N184 Chronic kidney disease, stage 4 (severe): Secondary | ICD-10-CM | POA: Diagnosis not present

## 2021-03-17 ENCOUNTER — Inpatient Hospital Stay: Payer: Medicare Other

## 2021-03-17 DIAGNOSIS — N184 Chronic kidney disease, stage 4 (severe): Secondary | ICD-10-CM

## 2021-03-17 DIAGNOSIS — R197 Diarrhea, unspecified: Secondary | ICD-10-CM | POA: Diagnosis not present

## 2021-03-17 DIAGNOSIS — E119 Type 2 diabetes mellitus without complications: Secondary | ICD-10-CM | POA: Diagnosis not present

## 2021-03-17 DIAGNOSIS — C642 Malignant neoplasm of left kidney, except renal pelvis: Secondary | ICD-10-CM | POA: Diagnosis not present

## 2021-03-17 DIAGNOSIS — E785 Hyperlipidemia, unspecified: Secondary | ICD-10-CM | POA: Diagnosis not present

## 2021-03-17 DIAGNOSIS — C649 Malignant neoplasm of unspecified kidney, except renal pelvis: Secondary | ICD-10-CM | POA: Diagnosis not present

## 2021-03-17 DIAGNOSIS — D631 Anemia in chronic kidney disease: Secondary | ICD-10-CM | POA: Diagnosis not present

## 2021-03-17 DIAGNOSIS — I129 Hypertensive chronic kidney disease with stage 1 through stage 4 chronic kidney disease, or unspecified chronic kidney disease: Secondary | ICD-10-CM | POA: Diagnosis not present

## 2021-03-17 LAB — BASIC METABOLIC PANEL
Anion gap: 8 (ref 5–15)
BUN: 46 mg/dL — ABNORMAL HIGH (ref 8–23)
CO2: 27 mmol/L (ref 22–32)
Calcium: 8.9 mg/dL (ref 8.9–10.3)
Chloride: 107 mmol/L (ref 98–111)
Creatinine, Ser: 3.77 mg/dL — ABNORMAL HIGH (ref 0.61–1.24)
GFR, Estimated: 16 mL/min — ABNORMAL LOW (ref >60)
Glucose, Bld: 80 mg/dL (ref 70–99)
Potassium: 3.9 mmol/L (ref 3.5–5.1)
Sodium: 142 mmol/L (ref 135–145)

## 2021-03-20 NOTE — Discharge Instructions (Signed)

## 2021-03-22 ENCOUNTER — Ambulatory Visit: Payer: Medicare Other | Admitting: Anesthesiology

## 2021-03-22 ENCOUNTER — Encounter
Admission: RE | Disposition: A | Discharge status: Home / Self Care | Payer: Self-pay | Source: Home / Self Care | Attending: Ophthalmology

## 2021-03-22 ENCOUNTER — Other Ambulatory Visit: Payer: Self-pay

## 2021-03-22 ENCOUNTER — Encounter: Payer: Self-pay | Admitting: Ophthalmology

## 2021-03-22 ENCOUNTER — Ambulatory Visit
Admission: RE | Admit: 2021-03-22 | Discharge: 2021-03-22 | Disposition: A | Discharge status: Home / Self Care | Payer: Medicare Other | Attending: Ophthalmology | Admitting: Ophthalmology

## 2021-03-22 DIAGNOSIS — E1122 Type 2 diabetes mellitus with diabetic chronic kidney disease: Secondary | ICD-10-CM | POA: Insufficient documentation

## 2021-03-22 DIAGNOSIS — Z79899 Other long term (current) drug therapy: Secondary | ICD-10-CM | POA: Insufficient documentation

## 2021-03-22 DIAGNOSIS — E1136 Type 2 diabetes mellitus with diabetic cataract: Secondary | ICD-10-CM | POA: Diagnosis not present

## 2021-03-22 DIAGNOSIS — H25811 Combined forms of age-related cataract, right eye: Secondary | ICD-10-CM | POA: Diagnosis not present

## 2021-03-22 DIAGNOSIS — H2511 Age-related nuclear cataract, right eye: Secondary | ICD-10-CM | POA: Insufficient documentation

## 2021-03-22 DIAGNOSIS — F17210 Nicotine dependence, cigarettes, uncomplicated: Secondary | ICD-10-CM | POA: Diagnosis not present

## 2021-03-22 DIAGNOSIS — Z794 Long term (current) use of insulin: Secondary | ICD-10-CM | POA: Insufficient documentation

## 2021-03-22 DIAGNOSIS — N189 Chronic kidney disease, unspecified: Secondary | ICD-10-CM | POA: Diagnosis not present

## 2021-03-22 DIAGNOSIS — Z85528 Personal history of other malignant neoplasm of kidney: Secondary | ICD-10-CM | POA: Insufficient documentation

## 2021-03-22 DIAGNOSIS — I129 Hypertensive chronic kidney disease with stage 1 through stage 4 chronic kidney disease, or unspecified chronic kidney disease: Secondary | ICD-10-CM | POA: Diagnosis not present

## 2021-03-22 HISTORY — PX: CATARACT EXTRACTION W/PHACO: SHX586

## 2021-03-22 LAB — GLUCOSE, CAPILLARY
Glucose-Capillary: 123 mg/dL — ABNORMAL HIGH (ref 70–99)
Glucose-Capillary: 117 mg/dL — ABNORMAL HIGH (ref 70–99)

## 2021-03-22 SURGERY — PHACOEMULSIFICATION, CATARACT, WITH IOL INSERTION
Anesthesia: Monitor Anesthesia Care | Site: Eye | Laterality: Right

## 2021-03-22 MED ORDER — SIGHTPATH DOSE#1 BSS IO SOLN
INTRAOCULAR | Status: DC | PRN
  Administered 2021-03-22: 74 mL via OPHTHALMIC

## 2021-03-22 MED ORDER — MIDAZOLAM HCL 2 MG/2ML IJ SOLN
INTRAMUSCULAR | Status: DC | PRN
  Administered 2021-03-22: 1 mg via INTRAVENOUS

## 2021-03-22 MED ORDER — TETRACAINE HCL 0.5 % OP SOLN
1.0000 [drp] | OPHTHALMIC | Status: DC | PRN
  Administered 2021-03-22 (×4): 1 [drp] via OPHTHALMIC

## 2021-03-22 MED ORDER — MOXIFLOXACIN HCL 0.5 % OP SOLN
OPHTHALMIC | Status: DC | PRN
  Administered 2021-03-22: 0.2 mL via OPHTHALMIC

## 2021-03-22 MED ORDER — BRIMONIDINE TARTRATE-TIMOLOL 0.2-0.5 % OP SOLN
OPHTHALMIC | Status: DC | PRN
  Administered 2021-03-22: 1 [drp] via OPHTHALMIC

## 2021-03-22 MED ORDER — LACTATED RINGERS IV SOLN: INTRAVENOUS | Status: DC

## 2021-03-22 MED ORDER — SIGHTPATH DOSE#1 NA HYALUR & NA CHOND-NA HYALUR IO KIT
PACK | INTRAOCULAR | Status: DC | PRN
  Administered 2021-03-22: 1 via OPHTHALMIC

## 2021-03-22 MED ORDER — CYCLOPENTOLATE HCL 2 % OP SOLN
1.0000 [drp] | OPHTHALMIC | Status: DC | PRN
  Administered 2021-03-22 (×3): 1 [drp] via OPHTHALMIC

## 2021-03-22 MED ORDER — SIGHTPATH DOSE#1 BSS IO SOLN
INTRAOCULAR | Status: DC | PRN
  Administered 2021-03-22: 15 mL via INTRAOCULAR

## 2021-03-22 MED ORDER — FENTANYL CITRATE (PF) 100 MCG/2ML IJ SOLN
INTRAMUSCULAR | Status: DC | PRN
  Administered 2021-03-22: 50 ug via INTRAVENOUS

## 2021-03-22 MED ORDER — SIGHTPATH DOSE#1 BSS IO SOLN
INTRAOCULAR | Status: DC | PRN
  Administered 2021-03-22: 2 mL

## 2021-03-22 MED ORDER — PHENYLEPHRINE HCL 10 % OP SOLN
1.0000 [drp] | OPHTHALMIC | Status: DC | PRN
  Administered 2021-03-22 (×3): 1 [drp] via OPHTHALMIC

## 2021-03-22 SURGICAL SUPPLY — 15 items
CANNULA ANT/CHMB 27GA (MISCELLANEOUS) ×2 IMPLANT
GLOVE SURG GAMMEX PI TX LF 7.5 (GLOVE) ×2 IMPLANT
GLOVE SURG TRIUMPH 8.0 PF LTX (GLOVE) ×2 IMPLANT
GOWN STRL REUS W/ TWL LRG LVL3 (GOWN DISPOSABLE) ×2 IMPLANT
GOWN STRL REUS W/TWL LRG LVL3 (GOWN DISPOSABLE) ×4
LENS IOL TECNIS EYHANCE 17.5 (Intraocular Lens) ×2 IMPLANT
MARKER SKIN DUAL TIP RULER LAB (MISCELLANEOUS) ×2 IMPLANT
NEEDLE CAPSULORHEX 25GA (NEEDLE) ×2 IMPLANT
NEEDLE FILTER BLUNT 18X 1/2SAF (NEEDLE) ×2
NEEDLE FILTER BLUNT 18X1 1/2 (NEEDLE) ×2 IMPLANT
PACK EYE AFTER SURG (MISCELLANEOUS) ×2 IMPLANT
SYR 3ML LL SCALE MARK (SYRINGE) ×4 IMPLANT
SYR TB 1ML LUER SLIP (SYRINGE) ×2 IMPLANT
WATER STERILE IRR 250ML POUR (IV SOLUTION) ×2 IMPLANT
WIPE NON LINTING 3.25X3.25 (MISCELLANEOUS) ×2 IMPLANT

## 2021-03-22 NOTE — Op Note (Signed)
  LOCATION:  [place]   PREOPERATIVE DIAGNOSIS:    Nuclear sclerotic cataract right eye. H25.11   POSTOPERATIVE DIAGNOSIS:  Nuclear sclerotic cataract right eye.     PROCEDURE:  Phacoemusification with posterior chamber intraocular lens placement of the right eye   ULTRASOUND TIME: Procedure(s) with comments: CATARACT EXTRACTION PHACO AND INTRAOCULAR LENS PLACEMENT ([facility]) RIGHT DIABETIC 6.14 01:08.8 (Right) - Diabetic - insulin  LENS:   Implant Name Type Inv. Item Serial No. Manufacturer Lot No. LRB No. Used Action  LENS IOL TECNIS EYHANCE 17.5 - [id number] Intraocular Lens LENS IOL TECNIS EYHANCE 17.5 [id number] JOHNSON   Right 1 Implanted         SURGEON:  [name], MD   ANESTHESIA:  Topical with tetracaine drops and 2% Xylocaine jelly, augmented with 1% preservative-free intracameral lidocaine.    COMPLICATIONS:  None.   DESCRIPTION OF PROCEDURE:  The patient was identified in the holding room and transported to the operating room and placed in the supine position under the operating microscope.  The right eye was identified as the operative eye and it was prepped and draped in the usual sterile ophthalmic fashion.   A 1 millimeter clear-corneal paracentesis was made at the 12:00 position.  0.5 ml of preservative-free 1% lidocaine was injected into the anterior chamber. The anterior chamber was filled with Viscoat viscoelastic.  A 2.4 millimeter keratome was used to make a near-clear corneal incision at the 9:00 position.  A curvilinear capsulorrhexis was made with a cystotome and capsulorrhexis forceps.  Balanced salt solution was used to hydrodissect and hydrodelineate the nucleus.   Phacoemulsification was then used in stop and chop fashion to remove the lens nucleus and epinucleus.  The remaining cortex was then removed using the irrigation and aspiration handpiece. Provisc was then placed into the capsular bag to distend it for lens placement.  A lens  was then injected into the capsular bag.  The remaining viscoelastic was aspirated.   Wounds were hydrated with balanced salt solution.  The anterior chamber was inflated to a physiologic pressure with balanced salt solution.  No wound leaks were noted. Vigamox 0.2 ml of a 1mg  per ml solution was injected into the anterior chamber for a dose of 0.2 mg of intracameral antibiotic at the completion of the case.   Timolol and Brimonidine drops were applied to the eye.  The patient was taken to the recovery room in stable condition without complications of anesthesia or surgery.   Jermari Tamargo 03/22/2021, 11:00 AM

## 2021-03-22 NOTE — H&P (Signed)
[facility]   Primary Care Physician:  Jodi Marble, MD Ophthalmologist: Dr. Leandrew Koyanagi  Pre-Procedure History & Physical: HPI:  Riley Stuart is a 70 y.o. male here for ophthalmic surgery.   Past Medical History:  Diagnosis Date   Chronic kidney disease    renal insufficiency   Diabetes mellitus without complication ([place])    Pt takes Insulin   Hypertension    Mental disorder    schizoaffective   Renal cell carcinoma ([place])    Wears dentures    full upper and lower    Past Surgical History:  Procedure Laterality Date   ANKLE FRACTURE SURGERY     CATARACT EXTRACTION W/PHACO Left 03/08/2021   Procedure: CATARACT EXTRACTION PHACO AND INTRAOCULAR LENS PLACEMENT ([place]) LEFT DIABETIC 8.48 01:06.9;  Surgeon: Leandrew Koyanagi, MD;  Location: [place];  Service: Ophthalmology;  Laterality: Left;  Diabetic - insulin   COLONOSCOPY WITH PROPOFOL N/A 04/15/2020   Procedure: COLONOSCOPY WITH PROPOFOL;  Surgeon: Virgel Manifold, MD;  Location: [facility] ENDOSCOPY;  Service: Endoscopy;  Laterality: N/A;   CRANIOTOMY Left 10/27/2013   Procedure: Craniotomy for Aneurysm Clipping;  Surgeon: Consuella Lose, MD;  Location: [place] [facility]UR[facility];  Service: Neurosurgery;  Laterality: Left;   IR RADIOLOGIST EVAL & MGMT  11/22/2020   IR RADIOLOGIST EVAL & MGMT  12/21/2020   KNEE SURGERY     due to fracture.    ROBOTIC ASSITED PARTIAL NEPHRECTOMY Left 08/10/2019   Procedure: XI ROBOTIC ASSITED LAPAROSCOPIC  PARTIAL NEPHRECTOMY;  Surgeon: Raynelle Bring, MD;  [facility];  Service: Urology;  Laterality: Left;    Prior to Admission medications   Medication Sig Start Date End Date Taking? Authorizing Provider  amLODipine-valsartan (EXFORGE) 10-320 MG tablet Take 1 tablet by mouth daily. 02/06/21  Yes [provider]  atorvastatin (LIPITOR) 10 MG tablet Take 1 tablet by mouth daily. 12/28/20  Yes [provider]  cabozantinib (CABOMETYX) 40 MG tablet  Take 1 tablet (40 mg total) by mouth daily. Take on an empty stomach, 1 hour before or 2 hours after meals. 02/28/21  Yes Earlie Server, MD  carvedilol (COREG) 25 MG tablet Take 25 mg by mouth 2 (two) times daily. 07/16/19  Yes [provider]  chlorthalidone (HYGROTON) 25 MG tablet Take 25 mg by mouth daily. 02/06/21  Yes [provider]  fenofibrate (TRICOR) 145 MG tablet Take 145 mg by mouth daily.   Yes [provider]  furosemide (LASIX) 20 MG tablet Take 0.5 tablets (10 mg total) by mouth daily. 03/10/21  Yes Earlie Server, MD  lamoTRIgine (LAMICTAL) 100 MG tablet Take 100 mg by mouth 2 (two) times daily.   Yes [provider]  loperamide (IMODIUM A-D) 2 MG tablet Take 1 tablet (2 mg total) by mouth See admin instructions. Take 4mg  with the onset of diarrhea, then take 2mg  after every loose bowel movements. Maximum 16mg  per 24 hours. 03/10/21  Yes Earlie Server, MD  NOVOLOG MIX 70/30 FLEXPEN (70-30) 100 UNIT/ML FlexPen Inject 30 Units as directed daily. 06/24/19  Yes [provider]  omeprazole (PRILOSEC) 40 MG capsule Take 40 mg by mouth daily. 10/05/20  Yes [provider]  tamsulosin (FLOMAX) 0.4 MG CAPS capsule Take 0.4 mg by mouth daily.  02/06/19  Yes [provider]  Valbenazine Tosylate (INGREZZA) 40 MG CAPS Take 40 mg by mouth daily.    Yes [provider]  ziprasidone (GEODON) 80 MG capsule Take 80 mg by mouth 2 (two)  times daily. 07/27/19  Yes [provider]  acetaminophen (TYLENOL) 650 MG CR tablet Take by mouth.    [provider]  ondansetron (ZOFRAN) 8 MG tablet Take 1 tablet (8 mg total) by mouth every 8 (eight) hours as needed for nausea or vomiting. 01/09/21   Darl Pikes, RPH-CPP  ULTICARE PEN NEEDLES 29G X 12.7MM [place]  11/02/19   [provider]    Allergies as of 12/19/2020   (No Known Allergies)    Family History  Problem Relation Age of Onset   Breast cancer Mother    Throat cancer  Brother    Prostate cancer Neg Hx    Colon cancer Neg Hx    Pancreatic cancer Neg Hx     Social History   Socioeconomic History   Marital status: Single    Spouse name: Not on file   Number of children: 0   Years of education: Not on file   Highest education level: Not on file  Occupational History   Not on file  Tobacco Use   Smoking status: Every Day    Packs/day: 0.50    Years: 48.00    Pack years: 24.00    Types: Cigarettes   Smokeless tobacco: Never  Vaping Use   Vaping Use: Never used  Substance and Sexual Activity   Alcohol use: Not Currently   Drug use: Never   Sexual activity: Not Currently  Other Topics Concern   Not on file  Social History Narrative   Not on file   Social Determinants of Health   Financial Resource Strain: Not on file  Food Insecurity: Not on file  Transportation Needs: Not on file  Physical Activity: Not on file  Stress: Not on file  Social Connections: Not on file  Intimate Partner Violence: Not on file    Review of Systems: See HPI, otherwise negative ROS  Physical Exam: BP (!) 172/100   Pulse (!) 48   Temp (!) 97.1 F (36.2 C) (Temporal)   Ht 6' (1.829 m)   Wt 103.5 kg   SpO2 97%   BMI 30.95 kg/m  General:   Alert,  pleasant and cooperative in NAD Head:  Normocephalic and atraumatic. Lungs:  Clear to auscultation.    Heart:  Regular rate and rhythm.   Impression/Plan: Riley Stuart is here for ophthalmic surgery.  Risks, benefits, limitations, and alternatives regarding ophthalmic surgery have been reviewed with the patient.  Questions have been answered.  All parties agreeable.   Leandrew Koyanagi, MD  03/22/2021, 7:38 AM

## 2021-03-22 NOTE — Anesthesia Postprocedure Evaluation (Signed)
Anesthesia Post Note  Patient: Riley Stuart  Procedure(s) Performed: CATARACT EXTRACTION PHACO AND INTRAOCULAR LENS PLACEMENT ([facility]) RIGHT DIABETIC 6.14 01:08.8 (Right: Eye)     Patient location during evaluation: PACU Anesthesia Type: MAC Level of consciousness: awake and alert Pain management: pain level controlled Vital Signs Assessment: post-procedure vital signs reviewed and stable Respiratory status: spontaneous breathing Cardiovascular status: stable Anesthetic complications: no   No notable events documented.  Gillian Scarce

## 2021-03-22 NOTE — Anesthesia Preprocedure Evaluation (Addendum)
Anesthesia Evaluation  Patient identified by MRN, date of birth, ID band Patient awake    Reviewed: Allergy & Precautions, H&P , NPO status , Patient's Chart, lab work & pertinent test results  Airway Mallampati: I  TM Distance: >3 FB Neck ROM: full    Dental  (+) Edentulous Upper, Edentulous Lower   Pulmonary Current Smoker and Patient abstained from smoking.,    Pulmonary exam normal breath sounds clear to auscultation       Cardiovascular hypertension, On Medications Normal cardiovascular exam Rhythm:regular Rate:Normal     Neuro/Psych PSYCHIATRIC DISORDERS    GI/Hepatic negative GI ROS, Neg liver ROS,   Endo/Other  diabetes, Well Controlled, Type 2, Insulin Dependent  Renal/GU   negative genitourinary   Musculoskeletal   Abdominal   Peds  Hematology negative hematology ROS (+)   Anesthesia Other Findings   Reproductive/Obstetrics                           Anesthesia Physical Anesthesia Plan  ASA: 3  Anesthesia Plan: MAC   Post-op Pain Management:    Induction:   PONV Risk Score and Plan: 0 and Treatment may vary due to age or medical condition  Airway Management Planned:   Additional Equipment:   Intra-op Plan:   Post-operative Plan:   Informed Consent: I have reviewed the patients History and Physical, chart, labs and discussed the procedure including the risks, benefits and alternatives for the proposed anesthesia with the patient or authorized representative who has indicated his/her understanding and acceptance.       Plan Discussed with:   Anesthesia Plan Comments:        Anesthesia Quick Evaluation

## 2021-03-22 NOTE — Transfer of Care (Signed)
Immediate Anesthesia Transfer of Care Note  Patient: Riley Stuart  Procedure(s) Performed: CATARACT EXTRACTION PHACO AND INTRAOCULAR LENS PLACEMENT ([facility]) RIGHT DIABETIC 6.14 01:08.8 (Right: Eye)  Patient Location: PACU  Anesthesia Type: MAC  Level of Consciousness: awake, alert  and patient cooperative  Airway and Oxygen Therapy: Patient Spontanous Breathing and Patient connected to supplemental oxygen  Post-op Assessment: Post-op Vital signs reviewed, Patient's Cardiovascular Status Stable, Respiratory Function Stable, Patent Airway and No signs of Nausea or vomiting  Post-op Vital Signs: Reviewed and stable  Complications: No notable events documented.

## 2021-03-22 NOTE — Anesthesia Procedure Notes (Signed)
Procedure Name: MAC Date/Time: 03/22/2021 10:39 AM Performed by: Dionne Bucy, CRNA Pre-anesthesia Checklist: Patient identified, Emergency Drugs available, Suction available, Patient being monitored and Timeout performed Patient Re-evaluated:Patient Re-evaluated prior to induction Oxygen Delivery Method: Nasal cannula Placement Confirmation: positive ETCO2

## 2021-03-23 ENCOUNTER — Encounter: Payer: Self-pay | Admitting: Ophthalmology

## 2021-03-28 ENCOUNTER — Telehealth: Payer: Self-pay | Admitting: Podiatry

## 2021-03-28 ENCOUNTER — Other Ambulatory Visit (HOSPITAL_COMMUNITY): Payer: Self-pay

## 2021-03-28 NOTE — Telephone Encounter (Signed)
Received voicemail from [name] @ [facility] checking to see if pts diabetic shoes/inserts are in...  Upon checking they are in the boxes that I am opening. I called and left message that I have scheduled pt to pick them up on 8.17 @ 245 and to call to confirm. We have a very limited spaces available so I held that spot for him.Riley Stuart

## 2021-03-30 ENCOUNTER — Other Ambulatory Visit (HOSPITAL_COMMUNITY): Payer: Self-pay

## 2021-04-03 ENCOUNTER — Other Ambulatory Visit (HOSPITAL_COMMUNITY): Payer: Self-pay

## 2021-04-03 ENCOUNTER — Other Ambulatory Visit: Payer: Medicare Other

## 2021-04-06 ENCOUNTER — Other Ambulatory Visit (HOSPITAL_COMMUNITY): Payer: Self-pay

## 2021-04-12 ENCOUNTER — Ambulatory Visit (INDEPENDENT_AMBULATORY_CARE_PROVIDER_SITE_OTHER): Payer: Medicare Other | Admitting: Podiatry

## 2021-04-12 ENCOUNTER — Other Ambulatory Visit: Payer: Self-pay

## 2021-04-12 ENCOUNTER — Encounter: Payer: Self-pay | Admitting: Podiatry

## 2021-04-12 DIAGNOSIS — M2012 Hallux valgus (acquired), left foot: Secondary | ICD-10-CM | POA: Diagnosis not present

## 2021-04-12 DIAGNOSIS — M2042 Other hammer toe(s) (acquired), left foot: Secondary | ICD-10-CM

## 2021-04-12 DIAGNOSIS — M2041 Other hammer toe(s) (acquired), right foot: Secondary | ICD-10-CM | POA: Diagnosis not present

## 2021-04-12 DIAGNOSIS — M2011 Hallux valgus (acquired), right foot: Secondary | ICD-10-CM | POA: Diagnosis not present

## 2021-04-12 DIAGNOSIS — E1140 Type 2 diabetes mellitus with diabetic neuropathy, unspecified: Secondary | ICD-10-CM | POA: Diagnosis not present

## 2021-04-12 DIAGNOSIS — E0843 Diabetes mellitus due to underlying condition with diabetic autonomic (poly)neuropathy: Secondary | ICD-10-CM

## 2021-04-12 DIAGNOSIS — M2010 Hallux valgus (acquired), unspecified foot: Secondary | ICD-10-CM

## 2021-04-12 NOTE — Progress Notes (Addendum)
The patient presented to the office to day to pick up diabetic shoes and 3 pr diabetic custom inserts.   1 pr of  inserts were put in the shoes and the shoes were fitted to the patient.  The patient states they are comfortable and free of defect. He was satisfied with the fit of the shoe.  Instructions for break in and wear were dispensed.  The patient signed the delivery documentation and break in instruction form.   If any questions or concerns arise, she is instructed to call.  Otherwise she will be seen back for her next scheduled appointment   Gardiner Barefoot DPM

## 2021-04-14 ENCOUNTER — Inpatient Hospital Stay (HOSPITAL_BASED_OUTPATIENT_CLINIC_OR_DEPARTMENT_OTHER): Payer: Medicare Other | Admitting: Oncology

## 2021-04-14 ENCOUNTER — Inpatient Hospital Stay: Payer: Medicare Other | Attending: Oncology

## 2021-04-14 ENCOUNTER — Other Ambulatory Visit (HOSPITAL_COMMUNITY): Payer: Self-pay

## 2021-04-14 ENCOUNTER — Other Ambulatory Visit: Payer: Self-pay

## 2021-04-14 VITALS — BP 139/73 | HR 57 | Temp 97.8°F | Wt 223.0 lb

## 2021-04-14 DIAGNOSIS — I129 Hypertensive chronic kidney disease with stage 1 through stage 4 chronic kidney disease, or unspecified chronic kidney disease: Secondary | ICD-10-CM | POA: Insufficient documentation

## 2021-04-14 DIAGNOSIS — N184 Chronic kidney disease, stage 4 (severe): Secondary | ICD-10-CM

## 2021-04-14 DIAGNOSIS — R197 Diarrhea, unspecified: Secondary | ICD-10-CM | POA: Diagnosis not present

## 2021-04-14 DIAGNOSIS — I10 Essential (primary) hypertension: Secondary | ICD-10-CM | POA: Diagnosis not present

## 2021-04-14 DIAGNOSIS — C642 Malignant neoplasm of left kidney, except renal pelvis: Secondary | ICD-10-CM

## 2021-04-14 DIAGNOSIS — Z5111 Encounter for antineoplastic chemotherapy: Secondary | ICD-10-CM

## 2021-04-14 DIAGNOSIS — D631 Anemia in chronic kidney disease: Secondary | ICD-10-CM | POA: Diagnosis not present

## 2021-04-14 LAB — CBC WITH DIFFERENTIAL/PLATELET
Abs Immature Granulocytes: 0.01 10*3/uL (ref 0.00–0.07)
Basophils Absolute: 0 10*3/uL (ref 0.0–0.1)
Basophils Relative: 1 %
Eosinophils Absolute: 0 10*3/uL (ref 0.0–0.5)
Eosinophils Relative: 0 %
HCT: 35.5 % — ABNORMAL LOW (ref 39.0–52.0)
Hemoglobin: 12.2 g/dL — ABNORMAL LOW (ref 13.0–17.0)
Immature Granulocytes: 0 %
Lymphocytes Relative: 21 %
Lymphs Abs: 1.2 10*3/uL (ref 0.7–4.0)
MCH: 32.3 pg (ref 26.0–34.0)
MCHC: 34.4 g/dL (ref 30.0–36.0)
MCV: 93.9 fL (ref 80.0–100.0)
Monocytes Absolute: 0.4 10*3/uL (ref 0.1–1.0)
Monocytes Relative: 8 %
Neutro Abs: 4.1 10*3/uL (ref 1.7–7.7)
Neutrophils Relative %: 70 %
Platelets: 266 10*3/uL (ref 150–400)
RBC: 3.78 MIL/uL — ABNORMAL LOW (ref 4.22–5.81)
RDW: 18.2 % — ABNORMAL HIGH (ref 11.5–15.5)
WBC: 5.8 10*3/uL (ref 4.0–10.5)
nRBC: 0 % (ref 0.0–0.2)

## 2021-04-14 LAB — COMPREHENSIVE METABOLIC PANEL
ALT: 21 U/L (ref 0–44)
AST: 22 U/L (ref 15–41)
Albumin: 3.6 g/dL (ref 3.5–5.0)
Alkaline Phosphatase: 44 U/L (ref 38–126)
Anion gap: 9 (ref 5–15)
BUN: 36 mg/dL — ABNORMAL HIGH (ref 8–23)
CO2: 26 mmol/L (ref 22–32)
Calcium: 8.8 mg/dL — ABNORMAL LOW (ref 8.9–10.3)
Chloride: 109 mmol/L (ref 98–111)
Creatinine, Ser: 3.61 mg/dL — ABNORMAL HIGH (ref 0.61–1.24)
GFR, Estimated: 17 mL/min — ABNORMAL LOW (ref >60)
Glucose, Bld: 82 mg/dL (ref 70–99)
Potassium: 3.8 mmol/L (ref 3.5–5.1)
Sodium: 144 mmol/L (ref 135–145)
Total Bilirubin: 0.7 mg/dL (ref 0.3–1.2)
Total Protein: 7.5 g/dL (ref 6.5–8.1)

## 2021-04-15 ENCOUNTER — Encounter: Payer: Self-pay | Admitting: Oncology

## 2021-04-15 NOTE — Progress Notes (Signed)
Hematology/Oncology follow up note [facility] [phone] [phone]   Patient Care Team: Jodi Marble, MD as PCP - General (Internal Medicine) Marden Noble, MD (Internal Medicine) Tyler Pita, MD as Consulting Physician (Radiation Oncology)  REFERRING PROVIDER: Jodi Marble, MD  CHIEF COMPLAINTS/REASON FOR VISIT:  Follow-up for treatment of recurrent RCC  HISTORY OF PRESENTING ILLNESS:   Riley Stuart is a  70 y.o.  male with PMH listed below was seen in consultation at the request of  Jodi Marble, MD  for evaluation of [place] Patient has schizoaffective disorder and lives in the group home.  He is quite functional at baseline and makes his own medical decision. 11/27/2018 CT abdomen pelvis without contrast showed solid-appearing mass of the lower pole of the left kidney measuring up to 6.8 cm concerning for RCC. 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 08/10/2019, patient underwent partial left nephrectomy and results showed papillary renal cell carcinoma, type II, nuclear grade 3.  Size 9 cm.  Tumor invades perirenal fat (pT3a). Patient went image surveillance. 10/10/2020 , MRI abdomen with and without contrast showed new 2.4 x 1.8 cm faintly enhancing nodularity along the left kidney lower pole partial nephrectomy sites.  Suspicious for local recurrence.  New pathologically enlarged left periaortic lymph node, highly suspicious for malignancy in this context.  Numerous bilateral renal cysts.:  Diverticulosis lumbar spondylosis and degenerative disease 1.4 gallstone in the gallbladder.  Per note, urology Dr. Alinda Money has offered patient definitive surgical resection as well as regional lymph node dissection however patient declined the surgery due to his chronic kidney insufficiency and potential need of requiring dialysis after radical nephrectomy.  12/09/2020 status post SBRT to the  periaortic lymph node radiation oncology Dr. Tammi Klippel .  He was also seen by Dr. Pascal Lux for possible cryoablation.  Dr. Pascal Lux recommend repeat CT abdomen which has been ordered.  # 12/15/2020, CT abdomen without contrast showed nodular changes of the left retroperitoneum in the surgical bed suspicious for areas of disease recurrence in the left retroperitoneum and along the left flank.  Signs of increased soft tissue density about the inferior left renal sinus fat bowel following partial nephrectomy. Other scattered small nodules seen inferiorly along the plane of the sigmoid mesocolon not present on preoperative evaluations may serve as additional evidence of disease.    # Initially there was plan for cryoablation.  Patient was evaluated by IR Dr. Pascal Lux, however due to the disease progression on recent CT, Dr. Pascal Lux does not feel that patient will benefit from cryoablation at this point.   01/20/2021, started on cabozantinib 40 mg daily.  INTERVAL HISTORY Riley Stuart is a 70 y.o. male who has above history reviewed by me today presents for follow up visit for management of recurrent RCC Problems and complaints are listed below: He reports taking well. He is on Cabozantinib 40mg  daily.  He follows up with PCP closely to adjust his BP medication.  He is on coreg, amlodipine -valsartan, chlorthialidone and lasix.  He reports intermittent diarrhea, and group home has given him "diarrhea solution" PRN, no details about the anti diarrhea medication. No nausea, vomiting.    Review of Systems  Constitutional:  Negative for appetite change, chills, fatigue, fever and unexpected weight change.  HENT:   Negative for hearing loss and voice change.   Eyes:  Negative for eye problems and icterus.  Respiratory:  Negative for chest tightness, cough and shortness of breath.  Cardiovascular:  Negative for chest pain and leg swelling.  Gastrointestinal:  Negative for abdominal distention and abdominal  pain.  Endocrine: Negative for hot flashes.  Genitourinary:  Negative for difficulty urinating, dysuria and frequency.   Musculoskeletal:  Negative for arthralgias.  Skin:  Negative for itching and rash.  Neurological:  Negative for light-headedness and numbness.  Hematological:  Negative for adenopathy. Does not bruise/bleed easily.  Psychiatric/Behavioral:  Negative for confusion.    MEDICAL HISTORY:  Past Medical History:  Diagnosis Date   Chronic kidney disease    renal insufficiency   Diabetes mellitus without complication ([place])    Pt takes Insulin   Hypertension    Mental disorder    schizoaffective   Renal cell carcinoma ([place])    Wears dentures    full upper and lower    SURGICAL HISTORY: Past Surgical History:  Procedure Laterality Date   ANKLE FRACTURE SURGERY     CATARACT EXTRACTION W/PHACO Left 03/08/2021   Procedure: CATARACT EXTRACTION PHACO AND INTRAOCULAR LENS PLACEMENT ([place]) LEFT DIABETIC 8.48 01:06.9;  Surgeon: Leandrew Koyanagi, MD;  Location: [place];  Service: Ophthalmology;  Laterality: Left;  Diabetic - insulin   CATARACT EXTRACTION W/PHACO Right 03/22/2021   Procedure: CATARACT EXTRACTION PHACO AND INTRAOCULAR LENS PLACEMENT ([facility]) RIGHT DIABETIC 6.14 01:08.8;  Surgeon: Leandrew Koyanagi, MD;  Location: [place];  Service: Ophthalmology;  Laterality: Right;  Diabetic - insulin   COLONOSCOPY WITH PROPOFOL N/A 04/15/2020   Procedure: COLONOSCOPY WITH PROPOFOL;  Surgeon: Virgel Manifold, MD;  Location: [facility] ENDOSCOPY;  Service: Endoscopy;  Laterality: N/A;   CRANIOTOMY Left 10/27/2013   Procedure: Craniotomy for Aneurysm Clipping;  Surgeon: Consuella Lose, MD;  Location: [place] [facility]UR[facility] ORS;  Service: Neurosurgery;  Laterality: Left;   IR RADIOLOGIST EVAL & MGMT  11/22/2020   IR RADIOLOGIST EVAL & MGMT  12/21/2020   KNEE SURGERY     due to fracture.    ROBOTIC ASSITED PARTIAL NEPHRECTOMY Left 08/10/2019   Procedure: XI ROBOTIC ASSITED  LAPAROSCOPIC  PARTIAL NEPHRECTOMY;  Surgeon: Raynelle Bring, MD;  [facility];  Service: Urology;  Laterality: Left;    SOCIAL HISTORY: Social History   Socioeconomic History   Marital status: Single    Spouse name: Not on file   Number of children: 0   Years of education: Not on file   Highest education level: Not on file  Occupational History   Not on file  Tobacco Use   Smoking status: Every Day    Packs/day: 0.50    Years: 48.00    Pack years: 24.00    Types: Cigarettes   Smokeless tobacco: Never  Vaping Use   Vaping Use: Never used  Substance and Sexual Activity   Alcohol use: Not Currently   Drug use: Never   Sexual activity: Not Currently  Other Topics Concern   Not on file  Social History Narrative   Not on file   Social Determinants of Health   Financial Resource Strain: Not on file  Food Insecurity: Not on file  Transportation Needs: Not on file  Physical Activity: Not on file  Stress: Not on file  Social Connections: Not on file  Intimate Partner Violence: Not on file    FAMILY HISTORY: Family History  Problem Relation Age of Onset   Breast cancer Mother    Throat cancer Brother    Prostate cancer Neg Hx    Colon cancer Neg Hx    Pancreatic cancer Neg Hx  ALLERGIES:  has No Known Allergies.  MEDICATIONS:  Current Outpatient Medications  Medication Sig Dispense Refill   acetaminophen (TYLENOL) 650 MG CR tablet Take by mouth.     amLODipine-valsartan (EXFORGE) 10-320 MG tablet Take 1 tablet by mouth daily.     atorvastatin (LIPITOR) 10 MG tablet Take 1 tablet by mouth daily.     carvedilol (COREG) 25 MG tablet Take 25 mg by mouth 2 (two) times daily.     chlorthalidone (HYGROTON) 25 MG tablet Take 25 mg by mouth daily.     fenofibrate (TRICOR) 145 MG tablet Take 145 mg by mouth daily.     lamoTRIgine (LAMICTAL) 100 MG tablet Take 100 mg by mouth 2 (two) times daily.     loperamide (IMODIUM A-D) 2 MG tablet Take 1 tablet (2 mg total) by  mouth See admin instructions. Take 4mg  with the onset of diarrhea, then take 2mg  after every loose bowel movements. Maximum 16mg  per 24 hours. 90 tablet 1   NOVOLOG MIX 70/30 FLEXPEN (70-30) 100 UNIT/ML FlexPen Inject 30 Units as directed daily.     omeprazole (PRILOSEC) 40 MG capsule Take 40 mg by mouth daily.     ondansetron (ZOFRAN) 8 MG tablet Take 1 tablet (8 mg total) by mouth every 8 (eight) hours as needed for nausea or vomiting. 20 tablet 0   tamsulosin (FLOMAX) 0.4 MG CAPS capsule Take 0.4 mg by mouth daily.      ULTICARE PEN NEEDLES 29G X 12.7MM MISC      Valbenazine Tosylate (INGREZZA) 40 MG CAPS Take 40 mg by mouth daily.      ziprasidone (GEODON) 80 MG capsule Take 80 mg by mouth 2 (two) times daily.     cabozantinib (CABOMETYX) 40 MG tablet Take 1 tablet (40 mg total) by mouth daily. Take on an empty stomach, 1 hour before or 2 hours after meals. 30 tablet 1   furosemide (LASIX) 20 MG tablet Take 0.5 tablets (10 mg total) by mouth daily. 30 tablet 0   No current facility-administered medications for this visit.     PHYSICAL EXAMINATION: ECOG PERFORMANCE STATUS: 1 - Symptomatic but completely ambulatory Vitals:   04/14/21 1159 04/14/21 1227  BP: (!) 201/59 139/73  Pulse: 61 (!) 57  Temp: 97.8 F (36.6 C)    Filed Weights   04/14/21 1159  Weight: 223 lb (101.2 kg)    Physical Exam Constitutional:      General: He is not in acute distress.    Comments: He ambulates independently  HENT:     Head: Normocephalic and atraumatic.  Eyes:     General: No scleral icterus. Cardiovascular:     Rate and Rhythm: Normal rate and regular rhythm.     Heart sounds: Normal heart sounds.  Pulmonary:     Effort: Pulmonary effort is normal. No respiratory distress.     Breath sounds: No wheezing.  Abdominal:     General: Bowel sounds are normal. There is no distension.     Palpations: Abdomen is soft.  Musculoskeletal:        General: No deformity. Normal range of motion.      Cervical back: Normal range of motion and neck supple.  Skin:    General: Skin is warm and dry.     Findings: No erythema or rash.  Neurological:     Mental Status: He is alert and oriented to person, place, and time. Mental status is at baseline.     Comments: Patient talks slowly  Psychiatric:        Mood and Affect: Mood normal.    LABORATORY DATA:  I have reviewed the data as listed Lab Results  Component Value Date   WBC 5.8 04/14/2021   HGB 12.2 (L) 04/14/2021   HCT 35.5 (L) 04/14/2021   MCV 93.9 04/14/2021   PLT 266 04/14/2021   Recent Labs    02/10/21 1121 03/10/21 1132 03/17/21 1117 04/14/21 1149  NA 140 141 142 144  K 3.7 3.4* 3.9 3.8  CL 104 106 107 109  CO2 26 24 27 26   GLUCOSE 147* 155* 80 82  BUN 33* 41* 46* 36*  CREATININE 2.94* 3.88* 3.77* 3.61*  CALCIUM 8.7* 8.6* 8.9 8.8*  GFRNONAA 22* 16* 16* 17*  PROT 7.2 7.2  --  7.5  ALBUMIN 3.8 3.5  --  3.6  AST 22 21  --  22  ALT 20 16  --  21  ALKPHOS 69 47  --  44  BILITOT 0.5 0.5  --  0.7    Iron/TIBC/Ferritin/ %Sat No results found for: IRON, TIBC, FERRITIN, IRONPCTSAT    RADIOGRAPHIC STUDIES: I have personally reviewed the radiological images as listed and agreed with the findings in the report. No results found.      ASSESSMENT & PLAN:  1. Stage 4 chronic kidney disease ([place])   2. Encounter for antineoplastic chemotherapy   3. Cancer of kidney, left ([place])   4. Primary hypertension   Cancer Staging Cancer of kidney, left [facility]) Staging form: Kidney, AJCC 8th Edition - Clinical stage from 11/28/2020: Stage III (rcT1a, cN1, cM0) - Signed by Earlie Server, MD on 11/28/2020   Recurrent left papillary renal cell carcinoma, with nodal involvement- SBRT 11/2020 by Dr.Manning. Patient declines surgery.  Labs are reviewed and discussed with patient. Continue Cabozantinib 40mg  daily.  Obtain CT chest abdomen pelvis to evaluate treatment response.    Hypertension, continue follow-up with primary care  provider.  Better controlled.   Diarrhea, Recommend imodium PRN as instructed.   CKD stage IV, Encourage oral hydration. SPEP negative, light chain ratio was slightly elevated, -expected in CKD. Creatinine is slightly better than last visit.    Anemia due to CKD, stable Hb  Follow up in 4 weeks.   Orders Placed This Encounter  Procedures   CT CHEST ABDOMEN PELVIS WO CONTRAST    Standing Status:   Future    Standing Expiration Date:   04/14/2022    Order Specific Question:   If indicated for the ordered procedure, I authorize the administration of contrast media per Radiology protocol    Answer:   Yes    Order Specific Question:   Preferred imaging location?    Answer:   [facility]    Order Specific Question:   Is Oral Contrast requested for this exam?    Answer:   Yes, Per Radiology protocol    Order Specific Question:   Reason for Exam (SYMPTOM  OR DIAGNOSIS REQUIRED)    Answer:   RCC follow up   CBC with Differential/Platelet    Standing Status:   Future    Standing Expiration Date:   04/14/2022   Comprehensive metabolic panel    Standing Status:   Future    Standing Expiration Date:   04/14/2022    All questions were answered. The patient knows to call the clinic with any problems questions or concerns.  cc Jodi Marble, MD    Return of visit: 4 weeks  Earlie Server,  MD, PhD Hematology Oncology [facility] Pager- [phone] 04/15/2021

## 2021-04-25 ENCOUNTER — Other Ambulatory Visit (HOSPITAL_COMMUNITY): Payer: Self-pay

## 2021-04-25 DIAGNOSIS — Z961 Presence of intraocular lens: Secondary | ICD-10-CM | POA: Diagnosis not present

## 2021-04-25 DIAGNOSIS — E113212 Type 2 diabetes mellitus with mild nonproliferative diabetic retinopathy with macular edema, left eye: Secondary | ICD-10-CM | POA: Diagnosis not present

## 2021-04-27 ENCOUNTER — Other Ambulatory Visit: Payer: Self-pay | Admitting: Oncology

## 2021-04-27 ENCOUNTER — Other Ambulatory Visit (HOSPITAL_COMMUNITY): Payer: Self-pay

## 2021-04-27 DIAGNOSIS — C642 Malignant neoplasm of left kidney, except renal pelvis: Secondary | ICD-10-CM

## 2021-04-27 MED ORDER — CABOMETYX 40 MG PO TABS
40.0000 mg | ORAL_TABLET | Freq: Every day | ORAL | 1 refills | Status: DC
  Filled 2021-05-04: qty 30, 30d supply, fill #0
  Filled 2021-05-31: qty 30, 30d supply, fill #1

## 2021-05-02 ENCOUNTER — Other Ambulatory Visit (HOSPITAL_COMMUNITY): Payer: Self-pay

## 2021-05-04 ENCOUNTER — Other Ambulatory Visit (HOSPITAL_COMMUNITY): Payer: Self-pay

## 2021-05-04 ENCOUNTER — Ambulatory Visit
Admission: RE | Admit: 2021-05-04 | Discharge: 2021-05-04 | Disposition: A | Discharge status: Home / Self Care | Payer: Medicare Other | Source: Ambulatory Visit | Attending: Oncology | Admitting: Oncology

## 2021-05-04 ENCOUNTER — Telehealth: Payer: Self-pay | Admitting: Oncology

## 2021-05-04 DIAGNOSIS — K5730 Diverticulosis of large intestine without perforation or abscess without bleeding: Secondary | ICD-10-CM | POA: Diagnosis not present

## 2021-05-04 DIAGNOSIS — N184 Chronic kidney disease, stage 4 (severe): Secondary | ICD-10-CM | POA: Insufficient documentation

## 2021-05-04 DIAGNOSIS — K8020 Calculus of gallbladder without cholecystitis without obstruction: Secondary | ICD-10-CM | POA: Diagnosis not present

## 2021-05-04 DIAGNOSIS — J432 Centrilobular emphysema: Secondary | ICD-10-CM | POA: Diagnosis not present

## 2021-05-04 DIAGNOSIS — C642 Malignant neoplasm of left kidney, except renal pelvis: Secondary | ICD-10-CM | POA: Diagnosis not present

## 2021-05-04 DIAGNOSIS — K4020 Bilateral inguinal hernia, without obstruction or gangrene, not specified as recurrent: Secondary | ICD-10-CM | POA: Diagnosis not present

## 2021-05-04 DIAGNOSIS — I2510 Atherosclerotic heart disease of native coronary artery without angina pectoris: Secondary | ICD-10-CM | POA: Diagnosis not present

## 2021-05-04 DIAGNOSIS — I700 Atherosclerosis of aorta: Secondary | ICD-10-CM | POA: Diagnosis not present

## 2021-05-04 NOTE — Telephone Encounter (Signed)
Called patient's care giver Elige Ko and recommend patient to go to ER for concerning CT scan done today.  Possible lung infarction, need to rule out pulmonary embolism.  Called ER triage and provided patient's info.

## 2021-05-08 ENCOUNTER — Other Ambulatory Visit (HOSPITAL_COMMUNITY): Payer: Self-pay

## 2021-05-11 DIAGNOSIS — E1122 Type 2 diabetes mellitus with diabetic chronic kidney disease: Secondary | ICD-10-CM | POA: Diagnosis not present

## 2021-05-11 DIAGNOSIS — R809 Proteinuria, unspecified: Secondary | ICD-10-CM | POA: Diagnosis not present

## 2021-05-11 DIAGNOSIS — N2889 Other specified disorders of kidney and ureter: Secondary | ICD-10-CM | POA: Diagnosis not present

## 2021-05-11 DIAGNOSIS — N184 Chronic kidney disease, stage 4 (severe): Secondary | ICD-10-CM | POA: Diagnosis not present

## 2021-05-11 DIAGNOSIS — I129 Hypertensive chronic kidney disease with stage 1 through stage 4 chronic kidney disease, or unspecified chronic kidney disease: Secondary | ICD-10-CM | POA: Diagnosis not present

## 2021-05-12 ENCOUNTER — Ambulatory Visit
Admission: RE | Admit: 2021-05-12 | Discharge: 2021-05-12 | Disposition: A | Discharge status: Home / Self Care | Payer: Medicare Other | Source: Ambulatory Visit | Attending: Oncology | Admitting: Oncology

## 2021-05-12 ENCOUNTER — Other Ambulatory Visit: Payer: Self-pay

## 2021-05-12 ENCOUNTER — Encounter
Admission: RE | Admit: 2021-05-12 | Discharge: 2021-05-12 | Disposition: A | Discharge status: Home / Self Care | Payer: Medicare Other | Source: Ambulatory Visit | Attending: Oncology | Admitting: Oncology

## 2021-05-12 ENCOUNTER — Encounter: Payer: Self-pay | Admitting: Oncology

## 2021-05-12 ENCOUNTER — Other Ambulatory Visit (HOSPITAL_COMMUNITY): Payer: Self-pay

## 2021-05-12 ENCOUNTER — Telehealth: Payer: Self-pay | Admitting: *Deleted

## 2021-05-12 ENCOUNTER — Telehealth: Payer: Self-pay

## 2021-05-12 ENCOUNTER — Inpatient Hospital Stay: Payer: Medicare Other | Attending: Oncology

## 2021-05-12 ENCOUNTER — Inpatient Hospital Stay (HOSPITAL_BASED_OUTPATIENT_CLINIC_OR_DEPARTMENT_OTHER): Payer: Medicare Other | Admitting: Oncology

## 2021-05-12 VITALS — BP 147/81 | HR 50 | Temp 97.5°F | Resp 17 | Wt 219.0 lb

## 2021-05-12 DIAGNOSIS — Z808 Family history of malignant neoplasm of other organs or systems: Secondary | ICD-10-CM | POA: Diagnosis not present

## 2021-05-12 DIAGNOSIS — I2699 Other pulmonary embolism without acute cor pulmonale: Secondary | ICD-10-CM | POA: Diagnosis not present

## 2021-05-12 DIAGNOSIS — K4020 Bilateral inguinal hernia, without obstruction or gangrene, not specified as recurrent: Secondary | ICD-10-CM | POA: Diagnosis not present

## 2021-05-12 DIAGNOSIS — N400 Enlarged prostate without lower urinary tract symptoms: Secondary | ICD-10-CM | POA: Insufficient documentation

## 2021-05-12 DIAGNOSIS — Z803 Family history of malignant neoplasm of breast: Secondary | ICD-10-CM | POA: Insufficient documentation

## 2021-05-12 DIAGNOSIS — D631 Anemia in chronic kidney disease: Secondary | ICD-10-CM | POA: Diagnosis not present

## 2021-05-12 DIAGNOSIS — N184 Chronic kidney disease, stage 4 (severe): Secondary | ICD-10-CM

## 2021-05-12 DIAGNOSIS — Z86711 Personal history of pulmonary embolism: Secondary | ICD-10-CM | POA: Diagnosis not present

## 2021-05-12 DIAGNOSIS — K8020 Calculus of gallbladder without cholecystitis without obstruction: Secondary | ICD-10-CM | POA: Diagnosis not present

## 2021-05-12 DIAGNOSIS — C642 Malignant neoplasm of left kidney, except renal pelvis: Secondary | ICD-10-CM | POA: Diagnosis not present

## 2021-05-12 DIAGNOSIS — Z905 Acquired absence of kidney: Secondary | ICD-10-CM | POA: Insufficient documentation

## 2021-05-12 DIAGNOSIS — I700 Atherosclerosis of aorta: Secondary | ICD-10-CM | POA: Insufficient documentation

## 2021-05-12 DIAGNOSIS — K5730 Diverticulosis of large intestine without perforation or abscess without bleeding: Secondary | ICD-10-CM | POA: Diagnosis not present

## 2021-05-12 DIAGNOSIS — M47816 Spondylosis without myelopathy or radiculopathy, lumbar region: Secondary | ICD-10-CM | POA: Insufficient documentation

## 2021-05-12 DIAGNOSIS — F17210 Nicotine dependence, cigarettes, uncomplicated: Secondary | ICD-10-CM | POA: Insufficient documentation

## 2021-05-12 DIAGNOSIS — Z5111 Encounter for antineoplastic chemotherapy: Secondary | ICD-10-CM | POA: Diagnosis not present

## 2021-05-12 DIAGNOSIS — Z79899 Other long term (current) drug therapy: Secondary | ICD-10-CM | POA: Diagnosis not present

## 2021-05-12 DIAGNOSIS — J432 Centrilobular emphysema: Secondary | ICD-10-CM | POA: Insufficient documentation

## 2021-05-12 DIAGNOSIS — R0602 Shortness of breath: Secondary | ICD-10-CM | POA: Diagnosis not present

## 2021-05-12 DIAGNOSIS — I2510 Atherosclerotic heart disease of native coronary artery without angina pectoris: Secondary | ICD-10-CM | POA: Insufficient documentation

## 2021-05-12 DIAGNOSIS — R001 Bradycardia, unspecified: Secondary | ICD-10-CM | POA: Insufficient documentation

## 2021-05-12 DIAGNOSIS — C7800 Secondary malignant neoplasm of unspecified lung: Secondary | ICD-10-CM | POA: Insufficient documentation

## 2021-05-12 DIAGNOSIS — F259 Schizoaffective disorder, unspecified: Secondary | ICD-10-CM | POA: Insufficient documentation

## 2021-05-12 DIAGNOSIS — Z0389 Encounter for observation for other suspected diseases and conditions ruled out: Secondary | ICD-10-CM | POA: Diagnosis not present

## 2021-05-12 DIAGNOSIS — J9811 Atelectasis: Secondary | ICD-10-CM | POA: Diagnosis not present

## 2021-05-12 DIAGNOSIS — R918 Other nonspecific abnormal finding of lung field: Secondary | ICD-10-CM | POA: Diagnosis not present

## 2021-05-12 DIAGNOSIS — Z7901 Long term (current) use of anticoagulants: Secondary | ICD-10-CM | POA: Diagnosis not present

## 2021-05-12 LAB — COMPREHENSIVE METABOLIC PANEL
ALT: 21 U/L (ref 0–44)
AST: 24 U/L (ref 15–41)
Albumin: 3.5 g/dL (ref 3.5–5.0)
Alkaline Phosphatase: 46 U/L (ref 38–126)
Anion gap: 10 (ref 5–15)
BUN: 33 mg/dL — ABNORMAL HIGH (ref 8–23)
CO2: 22 mmol/L (ref 22–32)
Calcium: 8.4 mg/dL — ABNORMAL LOW (ref 8.9–10.3)
Chloride: 110 mmol/L (ref 98–111)
Creatinine, Ser: 3.72 mg/dL — ABNORMAL HIGH (ref 0.61–1.24)
GFR, Estimated: 17 mL/min — ABNORMAL LOW (ref >60)
Glucose, Bld: 86 mg/dL (ref 70–99)
Potassium: 4.2 mmol/L (ref 3.5–5.1)
Sodium: 142 mmol/L (ref 135–145)
Total Bilirubin: 0.5 mg/dL (ref 0.3–1.2)
Total Protein: 6.9 g/dL (ref 6.5–8.1)

## 2021-05-12 LAB — CBC WITH DIFFERENTIAL/PLATELET
Abs Immature Granulocytes: 0.01 10*3/uL (ref 0.00–0.07)
Basophils Absolute: 0 10*3/uL (ref 0.0–0.1)
Basophils Relative: 1 %
Eosinophils Absolute: 0 10*3/uL (ref 0.0–0.5)
Eosinophils Relative: 0 %
HCT: 34.2 % — ABNORMAL LOW (ref 39.0–52.0)
Hemoglobin: 11.9 g/dL — ABNORMAL LOW (ref 13.0–17.0)
Immature Granulocytes: 0 %
Lymphocytes Relative: 28 %
Lymphs Abs: 1.2 10*3/uL (ref 0.7–4.0)
MCH: 32.9 pg (ref 26.0–34.0)
MCHC: 34.8 g/dL (ref 30.0–36.0)
MCV: 94.5 fL (ref 80.0–100.0)
Monocytes Absolute: 0.3 10*3/uL (ref 0.1–1.0)
Monocytes Relative: 7 %
Neutro Abs: 2.6 10*3/uL (ref 1.7–7.7)
Neutrophils Relative %: 64 %
Platelets: 219 10*3/uL (ref 150–400)
RBC: 3.62 MIL/uL — ABNORMAL LOW (ref 4.22–5.81)
RDW: 18.3 % — ABNORMAL HIGH (ref 11.5–15.5)
WBC: 4.1 10*3/uL (ref 4.0–10.5)
nRBC: 0 % (ref 0.0–0.2)

## 2021-05-12 MED ORDER — TECHNETIUM TO 99M ALBUMIN AGGREGATED
4.0000 | Freq: Once | INTRAVENOUS | Status: AC | PRN
  Administered 2021-05-12: 4.08 via INTRAVENOUS

## 2021-05-12 MED ORDER — APIXABAN (ELIQUIS) VTE STARTER PACK (10MG AND 5MG): ORAL_TABLET | ORAL | 0 refills | Status: DC

## 2021-05-12 NOTE — Telephone Encounter (Signed)
Stated on PA and this message was received: This medication or product is on your plan's list of covered drugs. Prior authorization is not required at this time. If your pharmacy has questions regarding the processing of your prescription, please have them call the [facility] pharmacy help desk at [phone].

## 2021-05-12 NOTE — Telephone Encounter (Signed)
Call from pharmacy stating that they do not have the Apixaban starter pack and they tried to run it through with regular tabs and it will need a PA. Asking if we have a coupon for patient or what needs to be done

## 2021-05-12 NOTE — Telephone Encounter (Signed)
Please schedule patient for lab/Md in 2 weeks (cbc,cmp).

## 2021-05-12 NOTE — Telephone Encounter (Signed)
Patient notified, updated AVS mailed.

## 2021-05-12 NOTE — Progress Notes (Signed)
Hematology/Oncology follow up note [facility] Telephone:([phone] [phone]   Patient Care Team: Jodi Marble, MD as PCP - General (Internal Medicine) Marden Noble, MD (Internal Medicine) Tyler Pita, MD as Consulting Physician (Radiation Oncology)  REFERRING PROVIDER: Jodi Marble, MD  CHIEF COMPLAINTS/REASON FOR VISIT:  Follow-up for treatment of recurrent RCC  HISTORY OF PRESENTING ILLNESS:   Riley Stuart is a  70 y.o.  male with PMH listed below was seen in consultation at the request of  Jodi Marble, MD  for evaluation of [place] Patient has schizoaffective disorder and lives in the group home.  He is quite functional at baseline and makes his own medical decision. 11/27/2018 CT abdomen pelvis without contrast showed solid-appearing mass of the lower pole of the left kidney measuring up to 6.8 cm concerning for RCC. 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 08/10/2019, patient underwent partial left nephrectomy and results showed papillary renal cell carcinoma, type II, nuclear grade 3.  Size 9 cm.  Tumor invades perirenal fat (pT3a). Patient went image surveillance. 10/10/2020 , MRI abdomen with and without contrast showed new 2.4 x 1.8 cm faintly enhancing nodularity along the left kidney lower pole partial nephrectomy sites.  Suspicious for local recurrence.  New pathologically enlarged left periaortic lymph node, highly suspicious for malignancy in this context.  Numerous bilateral renal cysts.:  Diverticulosis lumbar spondylosis and degenerative disease 1.4 gallstone in the gallbladder.  Per note, urology Dr. Alinda Money has offered patient definitive surgical resection as well as regional lymph node dissection however patient declined the surgery due to his chronic kidney insufficiency and potential need of requiring dialysis after radical nephrectomy.  12/09/2020 status post SBRT to the  periaortic lymph node radiation oncology Dr. Tammi Klippel .  He was also seen by Dr. Pascal Lux for possible cryoablation.  Dr. Pascal Lux recommend repeat CT abdomen which has been ordered.  # 12/15/2020, CT abdomen without contrast showed nodular changes of the left retroperitoneum in the surgical bed suspicious for areas of disease recurrence in the left retroperitoneum and along the left flank.  Signs of increased soft tissue density about the inferior left renal sinus fat bowel following partial nephrectomy. Other scattered small nodules seen inferiorly along the plane of the sigmoid mesocolon not present on preoperative evaluations may serve as additional evidence of disease.    # Initially there was plan for cryoablation.  Patient was evaluated by IR Dr. Pascal Lux, however due to the disease progression on recent CT, Dr. Pascal Lux does not feel that patient will benefit from cryoablation at this point.   01/20/2021, started on cabozantinib 40 mg daily.  INTERVAL HISTORY Riley Stuart is a 70 y.o. male who has above history reviewed by me today presents for follow up visit for management of recurrent RCC Problems and complaints are listed below: He reports taking well. He is on Cabozantinib 40mg  daily.   Patient had surveillance CT chest abdomen pelvis without contrast scan done on 05/04/2021.  There is concerning features for pulmonary infarcts. I called patient's caregiver [name] on 05/04/2021 recommend patient to go to emergency room for evaluation of pulmonary embolism. Patient declined ER evaluation.  He denies any shortness of breath, chest pain or lower extremity swelling. He has a remote intracranial bleeding history in 2015 due to aneurysm.  No recent acute bleeding events.  Review of Systems  Constitutional:  Negative for appetite change, chills, fatigue, fever and unexpected weight change.  HENT:   Negative  for hearing loss and voice change.   Eyes:  Negative for eye problems and icterus.   Respiratory:  Negative for chest tightness, cough and shortness of breath.   Cardiovascular:  Negative for chest pain and leg swelling.  Gastrointestinal:  Negative for abdominal distention and abdominal pain.  Endocrine: Negative for hot flashes.  Genitourinary:  Negative for difficulty urinating, dysuria and frequency.   Musculoskeletal:  Negative for arthralgias.  Skin:  Negative for itching and rash.  Neurological:  Negative for light-headedness and numbness.  Hematological:  Negative for adenopathy. Does not bruise/bleed easily.  Psychiatric/Behavioral:  Negative for confusion.    MEDICAL HISTORY:  Past Medical History:  Diagnosis Date   Chronic kidney disease    renal insufficiency   Diabetes mellitus without complication ([place])    Pt takes Insulin   Hypertension    Mental disorder    schizoaffective   Renal cell carcinoma ([place])    Wears dentures    full upper and lower    SURGICAL HISTORY: Past Surgical History:  Procedure Laterality Date   ANKLE FRACTURE SURGERY     CATARACT EXTRACTION W/PHACO Left 03/08/2021   Procedure: CATARACT EXTRACTION PHACO AND INTRAOCULAR LENS PLACEMENT ([place]) LEFT DIABETIC 8.48 01:06.9;  Surgeon: Leandrew Koyanagi, MD;  Location: [place];  Service: Ophthalmology;  Laterality: Left;  Diabetic - insulin   CATARACT EXTRACTION W/PHACO Right 03/22/2021   Procedure: CATARACT EXTRACTION PHACO AND INTRAOCULAR LENS PLACEMENT ([facility]) RIGHT DIABETIC 6.14 01:08.8;  Surgeon: Leandrew Koyanagi, MD;  Location: [place];  Service: Ophthalmology;  Laterality: Right;  Diabetic - insulin   COLONOSCOPY WITH PROPOFOL N/A 04/15/2020   Procedure: COLONOSCOPY WITH PROPOFOL;  Surgeon: Virgel Manifold, MD;  Location: [facility] END[facility]COPY;  Service: Endoscopy;  Laterality: N/A;   CRANIOTOMY Left 10/27/2013   Procedure: Craniotomy for Aneurysm Clipping;  Surgeon: Consuella Lose, MD;  Location: [place] [facility];  Service: Neurosurgery;  Laterality: Left;    IR RADIOLOGIST EVAL & MGMT  11/22/2020   IR RADIOLOGIST EVAL & MGMT  12/21/2020   KNEE SURGERY     due to fracture.    ROBOTIC ASSITED PARTIAL NEPHRECTOMY Left 08/10/2019   Procedure: XI ROBOTIC ASSITED LAPAROSCOPIC  PARTIAL NEPHRECTOMY;  Surgeon: Raynelle Bring, MD;  Location: [facility];  Service: Urology;  Laterality: Left;    SOCIAL HISTORY: Social History   Socioeconomic History   Marital status: Single    Spouse name: Not on file   Number of children: 0   Years of education: Not on file   Highest education level: Not on file  Occupational History   Not on file  Tobacco Use   Smoking status: Every Day    Packs/day: 0.50    Years: 48.00    Pack years: 24.00    Types: Cigarettes   Smokeless tobacco: Never  Vaping Use   Vaping Use: Never used  Substance and Sexual Activity   Alcohol use: Not Currently   Drug use: Never   Sexual activity: Not Currently  Other Topics Concern   Not on file  Social History Narrative   Not on file   Social Determinants of Health   Financial Resource Strain: Not on file  Food Insecurity: Not on file  Transportation Needs: Not on file  Physical Activity: Not on file  Stress: Not on file  Social Connections: Not on file  Intimate Partner Violence: Not on file    FAMILY HISTORY: Family History  Problem Relation Age of Onset   Breast cancer Mother  Throat cancer Brother    Prostate cancer Neg Hx    Colon cancer Neg Hx    Pancreatic cancer Neg Hx     ALLERGIES:  has No Known Allergies.  MEDICATIONS:  Current Outpatient Medications  Medication Sig Dispense Refill   acetaminophen (TYLENOL) 650 MG CR tablet Take by mouth.     amLODipine-valsartan (EXFORGE) 10-320 MG tablet Take 1 tablet by mouth daily.     APIXABAN (ELIQUIS) VTE STARTER PACK (10MG  AND 5MG ) Take as directed on package: start with two-5mg  tablets twice daily for 7 days. On day 8, switch to one-5mg  tablet twice daily. 1 each 0   atorvastatin (LIPITOR) 10 MG tablet  Take 1 tablet by mouth daily.     cabozantinib (CABOMETYX) 40 MG tablet Take 1 tablet (40 mg total) by mouth daily. Take on an empty stomach, 1 hour before or 2 hours after meals. 30 tablet 1   carvedilol (COREG) 25 MG tablet Take 25 mg by mouth 2 (two) times daily.     chlorthalidone (HYGROTON) 25 MG tablet Take 25 mg by mouth daily.     fenofibrate (TRICOR) 145 MG tablet Take 145 mg by mouth daily.     lamoTRIgine (LAMICTAL) 100 MG tablet Take 100 mg by mouth 2 (two) times daily.     loperamide (IMODIUM A-D) 2 MG tablet Take 1 tablet (2 mg total) by mouth See admin instructions. Take 4mg  with the onset of diarrhea, then take 2mg  after every loose bowel movements. Maximum 16mg  per 24 hours. 90 tablet 1   omeprazole (PRILOSEC) 40 MG capsule Take 40 mg by mouth daily.     ondansetron (ZOFRAN) 8 MG tablet Take 1 tablet (8 mg total) by mouth every 8 (eight) hours as needed for nausea or vomiting. 20 tablet 0   tamsulosin (FLOMAX) 0.4 MG CAPS capsule Take 0.4 mg by mouth daily.      ziprasidone (GEODON) 80 MG capsule Take 80 mg by mouth 2 (two) times daily.     furosemide (LASIX) 20 MG tablet Take 0.5 tablets (10 mg total) by mouth daily. (Patient not taking: Reported on 05/12/2021) 30 tablet 0   NOVOLOG MIX 70/30 FLEXPEN (70-30) 100 UNIT/ML FlexPen Inject 30 Units as directed daily. (Patient not taking: Reported on 05/12/2021)     ULTICARE PEN NEEDLES 29G X 12.7MM MISC  (Patient not taking: Reported on 05/12/2021)     Valbenazine Tosylate (INGREZZA) 40 MG CAPS Take 40 mg by mouth daily.  (Patient not taking: Reported on 05/12/2021)     No current facility-administered medications for this visit.     PHYSICAL EXAMINATION: ECOG PERFORMANCE STATUS: 1 - Symptomatic but completely ambulatory Vitals:   05/12/21 1035  BP: (!) 147/81  Pulse: (!) 50  Resp: 17  Temp: (!) 97.5 F (36.4 C)  SpO2: 100%   Filed Weights   05/12/21 1035  Weight: 219 lb (99.3 kg)    Physical Exam Constitutional:       General: He is not in acute distress.    Comments: He ambulates independently  HENT:     Head: Normocephalic and atraumatic.  Eyes:     General: No scleral icterus. Cardiovascular:     Rate and Rhythm: Normal rate and regular rhythm.     Heart sounds: Normal heart sounds.  Pulmonary:     Effort: Pulmonary effort is normal. No respiratory distress.     Breath sounds: No wheezing.  Abdominal:     General: Bowel sounds are normal. There is  no distension.     Palpations: Abdomen is soft.  Musculoskeletal:        General: No deformity. Normal range of motion.     Cervical back: Normal range of motion and neck supple.  Skin:    General: Skin is warm and dry.     Findings: No erythema or rash.  Neurological:     Mental Status: He is alert and oriented to person, place, and time. Mental status is at baseline.     Comments: Patient talks slowly  Psychiatric:        Mood and Affect: Mood normal.    LABORATORY DATA:  I have reviewed the data as listed Lab Results  Component Value Date   WBC 4.1 05/12/2021   HGB 11.9 (L) 05/12/2021   HCT 34.2 (L) 05/12/2021   MCV 94.5 05/12/2021   PLT 219 05/12/2021   Recent Labs    03/10/21 1132 03/17/21 1117 04/14/21 1149 05/12/21 0952  NA 141 142 144 142  K 3.4* 3.9 3.8 4.2  CL 106 107 109 110  CO2 24 27 26 22   GLUCOSE 155* 80 82 86  BUN 41* 46* 36* 33*  CREATININE 3.88* 3.77* 3.61* 3.72*  CALCIUM 8.6* 8.9 8.8* 8.4*  GFRNONAA 16* 16* 17* 17*  PROT 7.2  --  7.5 6.9  ALBUMIN 3.5  --  3.6 3.5  AST 21  --  22 24  ALT 16  --  21 21  ALKPHOS 47  --  44 46  BILITOT 0.5  --  0.7 0.5    Iron/TIBC/Ferritin/ %Sat No results found for: IRON, TIBC, FERRITIN, IRONPCTSAT    RADIOGRAPHIC STUDIES: I have personally reviewed the radiological images as listed and agreed with the findings in the report. DG Chest 2 View  Result Date: 05/12/2021 CLINICAL DATA:  Post NM pulmonary profusion EXAM: CHEST - 2 VIEW COMPARISON:  CT 05/04/2021 FINDINGS:  Subsegmental atelectasis/consolidation in the right middle lobe. Nodular opacity laterally in the lingula as seen on previous CT. No new airspace opacity. No overt edema. Heart size and mediastinal contours are within normal limits. No effusion.  No pneumothorax. Visualized bones unremarkable. IMPRESSION: 1. Lingular nodular opacity and right middle lobe atelectasis/consolidation, without convincing change compared to prior CT. Electronically Signed   By: Lucrezia Europe M.D.   On: 05/12/2021 13:52   NM Pulmonary Perfusion  Result Date: 05/12/2021 CLINICAL DATA:  Shortness of breath. History of renal cell carcinoma. Recent noncontrast chest CT demonstrated lingular and right middle lobe opacity suspicious for pulmonary infarcts. Question pulmonary embolism. EXAM: NUCLEAR MEDICINE PERFUSION LUNG SCAN TECHNIQUE: Perfusion images were obtained in multiple projections after intravenous injection of radiopharmaceutical. Ventilation scans intentionally deferred if perfusion scan and chest x-ray adequate for interpretation during COVID 19 epidemic. RADIOPHARMACEUTICALS:  4.08 mCi Tc-64m MAA IV COMPARISON:  Chest CT 05/04/2021. FINDINGS: The pulmonary perfusion is diffusely heterogeneous with multiple small to moderate perfusion defects, most notably posteriorly in the right upper lobe and within the lingula. Without a ventilation scan, the etiology for these perfusion defects is indeterminate. IMPRESSION: There are multiple perfusion defects in both lungs which are indeterminate without a ventilatory examination. Findings could definitely reflect the sequela of recent pulmonary embolism based on recent noncontrast CT. If the patient is unable to undergo chest CTA with contrast, consider further evaluation with lower extremity Doppler ultrasound to assess for DVT. These results will be called to the ordering clinician or representative by the Radiologist Assistant, and communication documented in the PACS  or [employer]. Electronically Signed   By: Richardean Sale M.D.   On: 05/12/2021 13:05   CT CHEST ABDOMEN PELVIS WO CONTRAST  Result Date: 05/04/2021 CLINICAL DATA:  Renal cell carcinoma restaging, status post partial left nephrectomy with suspected local recurrence and metastatic disease EXAM: CT CHEST, ABDOMEN AND PELVIS WITHOUT CONTRAST TECHNIQUE: Multidetector CT imaging of the chest, abdomen and pelvis was performed following the standard protocol without IV contrast. COMPARISON:  CT chest abdomen pelvis, 12/29/2020, MR abdomen, 10/10/2020 FINDINGS: CT CHEST FINDINGS Cardiovascular: Scattered aortic atherosclerosis. Normal heart size. Three-vessel coronary artery calcifications. No pericardial effusion. Mediastinum/Nodes: No enlarged mediastinal, hilar, or axillary lymph nodes. Thyroid gland, trachea, and esophagus demonstrate no significant findings. Lungs/Pleura: Mild centrilobular and paraseptal emphysema. There is a new nodule or consolidation of the posterior peripheral lingula abutting the fissure, measuring 2.9 x 2.8 cm (series 4, image 72). There is a new, flattened subpleural nodular consolidation of the anterior medial segment right middle lobe, with some evidence of central clearing, measuring 4.8 x 3.4 cm (series 4, image 86). No pleural effusion or pneumothorax. Musculoskeletal: No chest wall mass or suspicious bone lesions identified. CT ABDOMEN PELVIS FINDINGS Hepatobiliary: No solid liver abnormality is seen. Rim calcified gallstone in the dependent gallbladder. No gallbladder wall thickening or biliary dilatation. Pancreas: Unremarkable. No pancreatic ductal dilatation or surrounding inflammatory changes. Spleen: Normal in size without significant abnormality. Adrenals/Urinary Tract: Adrenal glands are unremarkable. Interval decrease in size of a slightly hyperattenuating nodule of the inferior pole of the left kidney, measuring approximately 1.9 x 1.8 cm, previously 2.7 x 2.1 cm (series 2,  image 71). Bladder is unremarkable. Stomach/Bowel: Stomach is within normal limits. Appendix appears normal. No evidence of bowel wall thickening, distention, or inflammatory changes. Pancolonic diverticulosis. Vascular/Lymphatic: Aortic atherosclerosis. Significant interval decrease in size in enlarged left retroperitoneal lymph nodes, largest node measuring 1.5 x 1.2 cm, previously 3.0 x 2.7 cm (series 2, image 71). Reproductive: Mild prostatomegaly. Other: Fat containing bilateral inguinal hernias. No abdominopelvic ascites. Soft tissue nodules about the left retroperitoneum and left paracolic gutter are stable or diminished compared to prior examination, for example a nodule adjacent to the inferior pole of the left kidney and adjacent descending colon, which is difficult to discretely appreciate on current examination although new measuring no greater than 0.5 cm, previously 1.0 cm (series 2, image 71). Additional nodules of the left retroperitoneum and paracolic gutter are stable (series 2, image 95). Musculoskeletal: No acute or significant osseous findings. IMPRESSION: 1. Interval decrease in size of a slightly hyperattenuating nodule of the inferior pole of the left kidney, consistent with treatment response of locally recurrent disease, better characterized by prior contrast enhanced MR. 2. Significant interval decrease in size in enlarged left retroperitoneal lymph nodes, consistent with treatment response of nodal metastatic disease. 3. Soft tissue nodules about the left retroperitoneum and left paracolic gutter are stable or diminished compared to prior examination, consistent with stable or improved soft tissue metastases. 4. There is a new nodule or consolidation of the posterior peripheral lingula abutting the fissure, measuring 2.9 x 2.8 cm, as well as a new, flattened subpleural nodular consolidation of the anterior medial segment right middle lobe, with some evidence of central clearing, measuring  4.8 x 3.4 cm. Although lung metastases are a significant differential consideration, morphologically, these findings do not favor metastasis and are suggestive of pulmonary infarctions. Consider CT angiogram to exclude pulmonary embolism. 5. Cholelithiasis. 6. Emphysema. 7. Coronary artery disease. These results will be called  to the ordering clinician or representative by the Radiologist Assistant, and communication documented in the PACS or [employer]. Aortic Atherosclerosis (ICD10-I70.0) and Emphysema (ICD10-J43.9). Electronically Signed   By: Riley Stuart M.D.   On: 05/04/2021 16:44        ASSESSMENT & PLAN:  1. Encounter for antineoplastic chemotherapy   2. Stage 4 chronic kidney disease ([place])   3. Anemia due to stage 4 chronic kidney disease ([place])   4. Other acute pulmonary embolism without acute cor pulmonale ([place])   Cancer Staging Cancer of kidney, left [facility]) Staging form: Kidney, AJCC 8th Edition - Clinical stage from 11/28/2020: Stage III (rcT1a, cN1, cM0) - Signed by Earlie Server, MD on 11/28/2020   Recurrent left papillary renal cell carcinoma, with nodal involvement- SBRT 11/2020 by Dr.Manning. Patient declines surgery.  Labs reviewed and discussed with patient Continue cabozantinib 40 mg daily. 05/04/2021, CT chest abdomen pelvis showed interval decrease of the size of slightly hypoattenuating nodule of the inferior pole of the left kidney.  Consistent with treatment response.  Significant interval decrease in size and enlarged left retroperitoneal lymph nodes.  Soft tissue nodules of the left retroperitoneum in the left paracolic gutter are stable or diminished compared to prior examination.  #3.9 x 2.8 cm new consolidation of the posterior peripheral lingula abutting the tissue.  Near flattened subpleural nodular consolidation of the anterior medial segment right middle lobe, 4.8 x 3.2 cm.  Favoring pulmonary infarction. Patient declined ER evaluation. A stat VQ scan was obtained.   Results showed multiple perfusion defects in both months which were indeterminate without ventilation examination.  Findings could definitely reflect the sequela of recent pulmonary embolism based on recent noncontrast CT. Stat bilateral lower extremity Doppler ultrasound showed no DVT.  Discussed with patient that given his underlying malignancy and CT and VQ scan findings, I recommend patient to be started on empiric Eliquis 10 mg twice daily for 1 week followed by Eliquis 5 mg twice daily.  I discussed the rationale and potential side effects of Eliquis with patient and her caregiver Stacha.  Rx has been sent to pharmacy and patient knows to start today.   Hypertension, continue follow-up with primary care provider.  BP is controlled.  Diarrhea, Recommend imodium PRN as instructed.   CKD stage IV, Encourage oral hydration. SPEP negative, light chain ratio was slightly elevated, -expected in CKD. Creatinine is stable.   Anemia due to CKD, stable Hb  Follow up in 2 weeks.   Orders Placed This Encounter  Procedures   DG Chest 2 View    Standing Status:   Future    Number of Occurrences:   1    Standing Expiration Date:   05/12/2022    Order Specific Question:   Reason for Exam (SYMPTOM  OR DIAGNOSIS REQUIRED)    Answer:   pulmonary infarct    Order Specific Question:   Preferred imaging location?    Answer:   [facility]   NM Pulmonary Perfusion    Standing Status:   Future    Number of Occurrences:   1    Standing Expiration Date:   05/12/2022    Order Specific Question:   If indicated for the ordered procedure, I authorize the administration of a radiopharmaceutical per Radiology protocol    Answer:   Yes    Order Specific Question:   Preferred imaging location?    Answer[facility]   US Venous Img Lower Bilateral    Standing Status:  Future    Number of Occurrences:   1    Standing Expiration Date:   05/12/2022    Order Specific Question:   Reason for Exam  (SYMPTOM  OR DIAGNOSIS REQUIRED)    Answer:   pulmonary infarct/ rule out dvt    Order Specific Question:   Preferred imaging location?    Answer[facility]    [facility]    Order Specific Question:   Call Results- Best Contact Number?    Answer:   [phone]    All questions were answered. The patient knows to call the clinic with any problems questions or concerns.  cc Jodi Marble, MD    Return of visit: 4 weeks  Earlie Server, MD, PhD Hematology Oncology [facility] Pager- [phone] 05/12/2021

## 2021-05-12 NOTE — Progress Notes (Signed)
Patient here for oncology follow-up appointment, expresses no complaints or concerns at this time.    

## 2021-05-18 DIAGNOSIS — I129 Hypertensive chronic kidney disease with stage 1 through stage 4 chronic kidney disease, or unspecified chronic kidney disease: Secondary | ICD-10-CM | POA: Diagnosis not present

## 2021-05-18 DIAGNOSIS — E1122 Type 2 diabetes mellitus with diabetic chronic kidney disease: Secondary | ICD-10-CM | POA: Diagnosis not present

## 2021-05-18 DIAGNOSIS — C642 Malignant neoplasm of left kidney, except renal pelvis: Secondary | ICD-10-CM | POA: Diagnosis not present

## 2021-05-18 DIAGNOSIS — C649 Malignant neoplasm of unspecified kidney, except renal pelvis: Secondary | ICD-10-CM | POA: Insufficient documentation

## 2021-05-18 DIAGNOSIS — R809 Proteinuria, unspecified: Secondary | ICD-10-CM | POA: Diagnosis not present

## 2021-05-18 DIAGNOSIS — N2889 Other specified disorders of kidney and ureter: Secondary | ICD-10-CM | POA: Diagnosis not present

## 2021-05-18 DIAGNOSIS — N184 Chronic kidney disease, stage 4 (severe): Secondary | ICD-10-CM | POA: Diagnosis not present

## 2021-05-18 DIAGNOSIS — D631 Anemia in chronic kidney disease: Secondary | ICD-10-CM | POA: Diagnosis not present

## 2021-05-25 ENCOUNTER — Ambulatory Visit (INDEPENDENT_AMBULATORY_CARE_PROVIDER_SITE_OTHER): Payer: Medicare Other | Admitting: Podiatry

## 2021-05-25 ENCOUNTER — Other Ambulatory Visit: Payer: Self-pay

## 2021-05-25 ENCOUNTER — Encounter: Payer: Self-pay | Admitting: Podiatry

## 2021-05-25 DIAGNOSIS — B351 Tinea unguium: Secondary | ICD-10-CM | POA: Diagnosis not present

## 2021-05-25 DIAGNOSIS — M2042 Other hammer toe(s) (acquired), left foot: Secondary | ICD-10-CM

## 2021-05-25 DIAGNOSIS — M2041 Other hammer toe(s) (acquired), right foot: Secondary | ICD-10-CM | POA: Diagnosis not present

## 2021-05-25 DIAGNOSIS — M79675 Pain in left toe(s): Secondary | ICD-10-CM | POA: Diagnosis not present

## 2021-05-25 DIAGNOSIS — M79674 Pain in right toe(s): Secondary | ICD-10-CM | POA: Diagnosis not present

## 2021-05-25 DIAGNOSIS — N183 Chronic kidney disease, stage 3 unspecified: Secondary | ICD-10-CM | POA: Diagnosis not present

## 2021-05-25 DIAGNOSIS — M2010 Hallux valgus (acquired), unspecified foot: Secondary | ICD-10-CM

## 2021-05-25 DIAGNOSIS — E0843 Diabetes mellitus due to underlying condition with diabetic autonomic (poly)neuropathy: Secondary | ICD-10-CM | POA: Diagnosis not present

## 2021-05-25 NOTE — Progress Notes (Addendum)
This patient returns to my office for at risk foot care.  This patient requires this care by a professional since this patient will be at risk due to having type 2 diabetes and stage 3 kidney disease  This patient is unable to cut nails himself since the patient cannot reach his nails.These nails are painful walking and wearing shoes. Painful callus both feet.  This patient presents for at risk foot care today.  Patient says he is doing well with his new diabetic shoes.  General Appearance  Alert, conversant and in no acute stress.  Vascular  Dorsalis pedis and posterior tibial  pulses are palpable  bilaterally.  Capillary return is within normal limits  bilaterally. Temperature is within normal limits  bilaterally.  Neurologic  Senn-Weinstein monofilament wire test absent bilaterally. Muscle power within normal limits bilaterally.  Nails Thick disfigured discolored nails with subungual debris  from second to fifth toes bilaterally. No evidence of bacterial infection or drainage bilaterally.  Orthopedic  No limitations of motion  feet .  No crepitus or effusions noted.  No bony pathology or digital deformities noted.  HAV  B/L.  Hammer toes  B/L.  Skin  normotropic skin with no porokeratosis noted bilaterally.  No signs of infections or ulcers noted.   Callus 1st MPJ and callus under right hallux.  Callus sub 5th  B/l.  Onychomycosis  Pain in right toes  Pain in left toes  Callus  B/L  Consent was obtained for treatment procedures.   Mechanical debridement of nails 1-5  bilaterally performed with a nail nipper.  Filed with dremel without incident.  Debride callus with # 15 blade followed by dremel tool.     Return office visit   3 months                  Told patient to return for periodic foot care and evaluation due to potential at risk complications.   Gardiner Barefoot DPM

## 2021-05-26 ENCOUNTER — Encounter: Payer: Self-pay | Admitting: Oncology

## 2021-05-26 ENCOUNTER — Inpatient Hospital Stay: Payer: Medicare Other

## 2021-05-26 ENCOUNTER — Inpatient Hospital Stay (HOSPITAL_BASED_OUTPATIENT_CLINIC_OR_DEPARTMENT_OTHER): Payer: Medicare Other | Admitting: Oncology

## 2021-05-26 VITALS — BP 126/67 | HR 49 | Temp 97.5°F | Resp 18 | Wt 221.8 lb

## 2021-05-26 DIAGNOSIS — C642 Malignant neoplasm of left kidney, except renal pelvis: Secondary | ICD-10-CM

## 2021-05-26 DIAGNOSIS — Z905 Acquired absence of kidney: Secondary | ICD-10-CM | POA: Diagnosis not present

## 2021-05-26 DIAGNOSIS — N184 Chronic kidney disease, stage 4 (severe): Secondary | ICD-10-CM

## 2021-05-26 DIAGNOSIS — Z5111 Encounter for antineoplastic chemotherapy: Secondary | ICD-10-CM

## 2021-05-26 DIAGNOSIS — I2699 Other pulmonary embolism without acute cor pulmonale: Secondary | ICD-10-CM | POA: Diagnosis not present

## 2021-05-26 DIAGNOSIS — K5730 Diverticulosis of large intestine without perforation or abscess without bleeding: Secondary | ICD-10-CM | POA: Diagnosis not present

## 2021-05-26 DIAGNOSIS — K4020 Bilateral inguinal hernia, without obstruction or gangrene, not specified as recurrent: Secondary | ICD-10-CM | POA: Diagnosis not present

## 2021-05-26 DIAGNOSIS — Z86711 Personal history of pulmonary embolism: Secondary | ICD-10-CM | POA: Diagnosis not present

## 2021-05-26 DIAGNOSIS — Z79899 Other long term (current) drug therapy: Secondary | ICD-10-CM | POA: Diagnosis not present

## 2021-05-26 DIAGNOSIS — Z7901 Long term (current) use of anticoagulants: Secondary | ICD-10-CM | POA: Diagnosis not present

## 2021-05-26 DIAGNOSIS — M47816 Spondylosis without myelopathy or radiculopathy, lumbar region: Secondary | ICD-10-CM | POA: Diagnosis not present

## 2021-05-26 DIAGNOSIS — I700 Atherosclerosis of aorta: Secondary | ICD-10-CM | POA: Diagnosis not present

## 2021-05-26 DIAGNOSIS — J432 Centrilobular emphysema: Secondary | ICD-10-CM | POA: Diagnosis not present

## 2021-05-26 DIAGNOSIS — Z808 Family history of malignant neoplasm of other organs or systems: Secondary | ICD-10-CM | POA: Diagnosis not present

## 2021-05-26 DIAGNOSIS — D631 Anemia in chronic kidney disease: Secondary | ICD-10-CM

## 2021-05-26 DIAGNOSIS — I2510 Atherosclerotic heart disease of native coronary artery without angina pectoris: Secondary | ICD-10-CM | POA: Diagnosis not present

## 2021-05-26 DIAGNOSIS — F17210 Nicotine dependence, cigarettes, uncomplicated: Secondary | ICD-10-CM | POA: Diagnosis not present

## 2021-05-26 DIAGNOSIS — K8020 Calculus of gallbladder without cholecystitis without obstruction: Secondary | ICD-10-CM | POA: Diagnosis not present

## 2021-05-26 DIAGNOSIS — R001 Bradycardia, unspecified: Secondary | ICD-10-CM | POA: Diagnosis not present

## 2021-05-26 DIAGNOSIS — C7800 Secondary malignant neoplasm of unspecified lung: Secondary | ICD-10-CM | POA: Diagnosis not present

## 2021-05-26 DIAGNOSIS — Z803 Family history of malignant neoplasm of breast: Secondary | ICD-10-CM | POA: Diagnosis not present

## 2021-05-26 LAB — CBC WITH DIFFERENTIAL/PLATELET
Abs Immature Granulocytes: 0.02 10*3/uL (ref 0.00–0.07)
Basophils Absolute: 0 10*3/uL (ref 0.0–0.1)
Basophils Relative: 0 %
Eosinophils Absolute: 0 10*3/uL (ref 0.0–0.5)
Eosinophils Relative: 0 %
HCT: 32.4 % — ABNORMAL LOW (ref 39.0–52.0)
Hemoglobin: 11.3 g/dL — ABNORMAL LOW (ref 13.0–17.0)
Immature Granulocytes: 0 %
Lymphocytes Relative: 23 %
Lymphs Abs: 1.1 10*3/uL (ref 0.7–4.0)
MCH: 33.5 pg (ref 26.0–34.0)
MCHC: 34.9 g/dL (ref 30.0–36.0)
MCV: 96.1 fL (ref 80.0–100.0)
Monocytes Absolute: 0.4 10*3/uL (ref 0.1–1.0)
Monocytes Relative: 8 %
Neutro Abs: 3.1 10*3/uL (ref 1.7–7.7)
Neutrophils Relative %: 69 %
Platelets: 272 10*3/uL (ref 150–400)
RBC: 3.37 MIL/uL — ABNORMAL LOW (ref 4.22–5.81)
RDW: 18.7 % — ABNORMAL HIGH (ref 11.5–15.5)
WBC: 4.5 10*3/uL (ref 4.0–10.5)
nRBC: 0 % (ref 0.0–0.2)

## 2021-05-26 LAB — COMPREHENSIVE METABOLIC PANEL
ALT: 21 U/L (ref 0–44)
AST: 25 U/L (ref 15–41)
Albumin: 3.5 g/dL (ref 3.5–5.0)
Alkaline Phosphatase: 50 U/L (ref 38–126)
Anion gap: 7 (ref 5–15)
BUN: 37 mg/dL — ABNORMAL HIGH (ref 8–23)
CO2: 25 mmol/L (ref 22–32)
Calcium: 8.1 mg/dL — ABNORMAL LOW (ref 8.9–10.3)
Chloride: 110 mmol/L (ref 98–111)
Creatinine, Ser: 3.3 mg/dL — ABNORMAL HIGH (ref 0.61–1.24)
GFR, Estimated: 19 mL/min — ABNORMAL LOW (ref >60)
Glucose, Bld: 106 mg/dL — ABNORMAL HIGH (ref 70–99)
Potassium: 3.8 mmol/L (ref 3.5–5.1)
Sodium: 142 mmol/L (ref 135–145)
Total Bilirubin: 0.4 mg/dL (ref 0.3–1.2)
Total Protein: 6.7 g/dL (ref 6.5–8.1)

## 2021-05-26 MED ORDER — CALCIUM CARBONATE 600 MG PO TABS: 1200.0000 mg | ORAL_TABLET | Freq: Two times a day (BID) | ORAL | 3 refills | Status: DC

## 2021-05-26 MED ORDER — APIXABAN 5 MG PO TABS: 5.0000 mg | ORAL_TABLET | Freq: Two times a day (BID) | ORAL | 2 refills | Status: DC

## 2021-05-26 NOTE — Addendum Note (Signed)
Addended by: Earlie Server on: 05/26/2021 09:03 PM   Modules accepted: Orders

## 2021-05-26 NOTE — Progress Notes (Signed)
Hematology/Oncology follow up note [facility] [phone] [phone]   Patient Care Team: Jodi Marble, MD as PCP - General (Internal Medicine) Marden Noble, MD (Internal Medicine) Tyler Pita, MD as Consulting Physician (Radiation Oncology)  REFERRING PROVIDER: Jodi Marble, MD  CHIEF COMPLAINTS/REASON FOR VISIT:  Follow-up for treatment of recurrent RCC  HISTORY OF PRESENTING ILLNESS:   Riley Stuart is a  70 y.o.  male with PMH listed below was seen in consultation at the request of  Jodi Marble, MD  for evaluation of [place] Patient has schizoaffective disorder and lives in the group home.  He is quite functional at baseline and makes his own medical decision. 11/27/2018 CT abdomen pelvis without contrast showed solid-appearing mass of the lower pole of the left kidney measuring up to 6.8 cm concerning for RCC. 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 08/10/2019, patient underwent partial left nephrectomy and results showed papillary renal cell carcinoma, type II, nuclear grade 3.  Size 9 cm.  Tumor invades perirenal fat (pT3a). Patient went image surveillance. 10/10/2020 , MRI abdomen with and without contrast showed new 2.4 x 1.8 cm faintly enhancing nodularity along the left kidney lower pole partial nephrectomy sites.  Suspicious for local recurrence.  New pathologically enlarged left periaortic lymph node, highly suspicious for malignancy in this context.  Numerous bilateral renal cysts.:  Diverticulosis lumbar spondylosis and degenerative disease 1.4 gallstone in the gallbladder.  Per note, urology Dr. Alinda Money has offered patient definitive surgical resection as well as regional lymph node dissection however patient declined the surgery due to his chronic kidney insufficiency and potential need of requiring dialysis after radical nephrectomy.  12/09/2020 status post SBRT to the  periaortic lymph node radiation oncology Dr. Tammi Klippel .  He was also seen by Dr. Pascal Lux for possible cryoablation.  Dr. Pascal Lux recommend repeat CT abdomen which has been ordered.  # 12/15/2020, CT abdomen without contrast showed nodular changes of the left retroperitoneum in the surgical bed suspicious for areas of disease recurrence in the left retroperitoneum and along the left flank.  Signs of increased soft tissue density about the inferior left renal sinus fat bowel following partial nephrectomy. Other scattered small nodules seen inferiorly along the plane of the sigmoid mesocolon not present on preoperative evaluations may serve as additional evidence of disease.    # Initially there was plan for cryoablation.  Patient was evaluated by IR Dr. Pascal Lux, however due to the disease progression on recent CT, Dr. Pascal Lux does not feel that patient will benefit from cryoablation at this point.   01/20/2021, started on cabozantinib 40 mg daily.  INTERVAL HISTORY Riley Stuart is a 70 y.o. male who has above history reviewed by me today presents for follow up visit for management of recurrent RCC Problems and complaints are listed below: He reports taking well. He is on Cabozantinib 40mg  daily.  Currently on Eliquis 5mg  BID. Tolerates well.  Diarrhea has improved after using imodium PRN.  He denies any shortness of breath, chest pain or lower extremity swelling. He has a remote intracranial bleeding history in 2015 due to aneurysm.  No recent acute bleeding events.  Review of Systems  Constitutional:  Negative for appetite change, chills, fatigue, fever and unexpected weight change.  HENT:   Negative for hearing loss and voice change.   Eyes:  Negative for eye problems and icterus.  Respiratory:  Negative for chest tightness, cough and shortness of breath.  Cardiovascular:  Negative for chest pain and leg swelling.  Gastrointestinal:  Negative for abdominal distention and abdominal pain.   Endocrine: Negative for hot flashes.  Genitourinary:  Negative for difficulty urinating, dysuria and frequency.   Musculoskeletal:  Negative for arthralgias.  Skin:  Negative for itching and rash.  Neurological:  Negative for light-headedness and numbness.  Hematological:  Negative for adenopathy. Does not bruise/bleed easily.  Psychiatric/Behavioral:  Negative for confusion.    MEDICAL HISTORY:  Past Medical History:  Diagnosis Date   Chronic kidney disease    renal insufficiency   Diabetes mellitus without complication ([place])    Pt takes Insulin   Hypertension    Mental disorder    schizoaffective   Renal cell carcinoma ([place])    Wears dentures    full upper and lower    SURGICAL HISTORY: Past Surgical History:  Procedure Laterality Date   ANKLE FRACTURE SURGERY     CATARACT EXTRACTION W/PHACO Left 03/08/2021   Procedure: CATARACT EXTRACTION PHACO AND INTRAOCULAR LENS PLACEMENT ([place]) LEFT DIABETIC 8.48 01:06.9;  Surgeon: Leandrew Koyanagi, MD;  Location: [facility];  Service: Ophthalmology;  Laterality: Left;  Diabetic - insulin   CATARACT EXTRACTION W/PHACO Right 03/22/2021   Procedure: CATARACT EXTRACTION PHACO AND INTRAOCULAR LENS PLACEMENT ([facility]) RIGHT DIABETIC 6.14 01:08.8;  Surgeon: Leandrew Koyanagi, MD;  Location: [facility];  Service: Ophthalmology;  Laterality: Right;  Diabetic - insulin   COLONOSCOPY WITH PROPOFOL N/A 04/15/2020   Procedure: COLONOSCOPY WITH PROPOFOL;  Surgeon: Virgel Manifold, MD;  Location: [facility];  Service: Endoscopy;  Laterality: N/A;   CRANIOTOMY Left 10/27/2013   Procedure: Craniotomy for Aneurysm Clipping;  Surgeon: Consuella Lose, MD;  Location: [facility];  Service: Neurosurgery;  Laterality: Left;   IR RADIOLOGIST EVAL & MGMT  11/22/2020   IR RADIOLOGIST EVAL & MGMT  12/21/2020   KNEE SURGERY     due to fracture.    ROBOTIC ASSITED PARTIAL NEPHRECTOMY Left 08/10/2019   Procedure: XI ROBOTIC ASSITED  LAPAROSCOPIC  PARTIAL NEPHRECTOMY;  Surgeon: Raynelle Bring, MD;  Location: [facility];  Service: Urology;  Laterality: Left;    SOCIAL HISTORY: Social History   Socioeconomic History   Marital status: Single    Spouse name: Not on file   Number of children: 0   Years of education: Not on file   Highest education level: Not on file  Occupational History   Not on file  Tobacco Use   Smoking status: Every Day    Packs/day: 0.50    Years: 48.00    Pack years: 24.00    Types: Cigarettes   Smokeless tobacco: Never  Vaping Use   Vaping Use: Never used  Substance and Sexual Activity   Alcohol use: Not Currently   Drug use: Never   Sexual activity: Not Currently  Other Topics Concern   Not on file  Social History Narrative   Not on file   Social Determinants of Health   Financial Resource Strain: Not on file  Food Insecurity: Not on file  Transportation Needs: Not on file  Physical Activity: Not on file  Stress: Not on file  Social Connections: Not on file  Intimate Partner Violence: Not on file    FAMILY HISTORY: Family History  Problem Relation Age of Onset   Breast cancer Mother    Throat cancer Brother    Prostate cancer Neg Hx    Colon cancer Neg Hx    Pancreatic cancer Neg Hx  ALLERGIES:  has No Known Allergies.  MEDICATIONS:  Current Outpatient Medications  Medication Sig Dispense Refill   acetaminophen (TYLENOL) 650 MG CR tablet Take by mouth.     amLODipine-valsartan (EXFORGE) 10-320 MG tablet Take 1 tablet by mouth daily.     APIXABAN (ELIQUIS) VTE STARTER PACK (10MG  AND 5MG ) Take as directed on package: start with two-5mg  tablets twice daily for 7 days. On day 8, switch to one-5mg  tablet twice daily. 1 each 0   atorvastatin (LIPITOR) 10 MG tablet Take 1 tablet by mouth daily.     cabozantinib (CABOMETYX) 40 MG tablet Take 1 tablet (40 mg total) by mouth daily. Take on an empty stomach, 1 hour before or 2 hours after meals. 30 tablet 1   calcium  carbonate (OS-CAL) 600 MG TABS tablet Take 2 tablets (1,200 mg total) by mouth 2 (two) times daily with a meal. 60 tablet 3   carvedilol (COREG) 25 MG tablet Take 25 mg by mouth 2 (two) times daily.     DUREZOL 0.05 % EMUL Place 1 drop into the right eye 2 (two) times daily.     fenofibrate (TRICOR) 145 MG tablet Take 145 mg by mouth daily.     Insulin Lispro Prot & Lispro (HUMALOG 75/25 MIX) (75-25) 100 UNIT/ML Kwikpen Inject into the skin.     lamoTRIgine (LAMICTAL) 100 MG tablet Take 100 mg by mouth 2 (two) times daily.     loperamide (IMODIUM A-D) 2 MG tablet Take 1 tablet (2 mg total) by mouth See admin instructions. Take 4mg  with the onset of diarrhea, then take 2mg  after every loose bowel movements. Maximum 16mg  per 24 hours. 90 tablet 1   omeprazole (PRILOSEC) 40 MG capsule Take 40 mg by mouth daily.     ondansetron (ZOFRAN) 8 MG tablet Take 1 tablet (8 mg total) by mouth every 8 (eight) hours as needed for nausea or vomiting. 20 tablet 0   tamsulosin (FLOMAX) 0.4 MG CAPS capsule Take 0.4 mg by mouth daily.      ziprasidone (GEODON) 80 MG capsule Take 80 mg by mouth 2 (two) times daily.     chlorthalidone (HYGROTON) 25 MG tablet Take 25 mg by mouth daily. (Patient not taking: Reported on 05/26/2021)     furosemide (LASIX) 20 MG tablet Take 0.5 tablets (10 mg total) by mouth daily. (Patient not taking: No sig reported) 30 tablet 0   NOVOLOG MIX 70/30 FLEXPEN (70-30) 100 UNIT/ML FlexPen Inject 30 Units as directed daily. (Patient not taking: No sig reported)     ULTICARE PEN NEEDLES 29G X 12.7MM MISC  (Patient not taking: No sig reported)     Valbenazine Tosylate (INGREZZA) 40 MG CAPS Take 40 mg by mouth daily.  (Patient not taking: No sig reported)     valsartan (DIOVAN) 320 MG tablet  (Patient not taking: Reported on 05/26/2021)     No current facility-administered medications for this visit.     PHYSICAL EXAMINATION: ECOG PERFORMANCE STATUS: 1 - Symptomatic but completely  ambulatory Vitals:   05/26/21 1154  BP: 126/67  Pulse: (!) 49  Resp: 18  Temp: (!) 97.5 F (36.4 C)  SpO2: 99%   Filed Weights   05/26/21 1154  Weight: 221 lb 12.8 oz (100.6 kg)    Physical Exam Constitutional:      General: He is not in acute distress.    Comments: He ambulates independently  HENT:     Head: Normocephalic and atraumatic.  Eyes:  General: No scleral icterus. Cardiovascular:     Rate and Rhythm: Normal rate and regular rhythm.     Heart sounds: Normal heart sounds.  Pulmonary:     Effort: Pulmonary effort is normal. No respiratory distress.     Breath sounds: No wheezing.  Abdominal:     General: Bowel sounds are normal. There is no distension.     Palpations: Abdomen is soft.  Musculoskeletal:        General: No deformity. Normal range of motion.     Cervical back: Normal range of motion and neck supple.  Skin:    General: Skin is warm and dry.     Findings: No erythema or rash.  Neurological:     Mental Status: He is alert and oriented to person, place, and time. Mental status is at baseline.     Comments: Patient talks slowly  Psychiatric:        Mood and Affect: Mood normal.    LABORATORY DATA:  I have reviewed the data as listed Lab Results  Component Value Date   WBC 4.5 05/26/2021   HGB 11.3 (L) 05/26/2021   HCT 32.4 (L) 05/26/2021   MCV 96.1 05/26/2021   PLT 272 05/26/2021   Recent Labs    04/14/21 1149 05/12/21 0952 05/26/21 1143  NA 144 142 142  K 3.8 4.2 3.8  CL 109 110 110  CO2 26 22 25   GLUCOSE 82 86 106*  BUN 36* 33* 37*  CREATININE 3.61* 3.72* 3.30*  CALCIUM 8.8* 8.4* 8.1*  GFRNONAA 17* 17* 19*  PROT 7.5 6.9 6.7  ALBUMIN 3.6 3.5 3.5  AST 22 24 25   ALT 21 21 21   ALKPHOS 44 46 50  BILITOT 0.7 0.5 0.4    Iron/TIBC/Ferritin/ %Sat No results found for: IRON, TIBC, FERRITIN, IRONPCTSAT    RADIOGRAPHIC STUDIES: I have personally reviewed the radiological images as listed and agreed with the findings in the  report. DG Chest 2 View  Result Date: 05/12/2021 CLINICAL DATA:  Post NM pulmonary profusion EXAM: CHEST - 2 VIEW COMPARISON:  CT 05/04/2021 FINDINGS: Subsegmental atelectasis/consolidation in the right middle lobe. Nodular opacity laterally in the lingula as seen on previous CT. No new airspace opacity. No overt edema. Heart size and mediastinal contours are within normal limits. No effusion.  No pneumothorax. Visualized bones unremarkable. IMPRESSION: 1. Lingular nodular opacity and right middle lobe atelectasis/consolidation, without convincing change compared to prior CT. Electronically Signed   By: Lucrezia Europe M.D.   On: 05/12/2021 13:52   NM Pulmonary Perfusion  Result Date: 05/12/2021 CLINICAL DATA:  Shortness of breath. History of renal cell carcinoma. Recent noncontrast chest CT demonstrated lingular and right middle lobe opacity suspicious for pulmonary infarcts. Question pulmonary embolism. EXAM: NUCLEAR MEDICINE PERFUSION LUNG SCAN TECHNIQUE: Perfusion images were obtained in multiple projections after intravenous injection of radiopharmaceutical. Ventilation scans intentionally deferred if perfusion scan and chest x-ray adequate for interpretation during COVID 19 epidemic. RADIOPHARMACEUTICALS:  4.08 mCi Tc-61m MAA IV COMPARISON:  Chest CT 05/04/2021. FINDINGS: The pulmonary perfusion is diffusely heterogeneous with multiple small to moderate perfusion defects, most notably posteriorly in the right upper lobe and within the lingula. Without a ventilation scan, the etiology for these perfusion defects is indeterminate. IMPRESSION: There are multiple perfusion defects in both lungs which are indeterminate without a ventilatory examination. Findings could definitely reflect the sequela of recent pulmonary embolism based on recent noncontrast CT. If the patient is unable to undergo chest CTA with contrast, consider  further evaluation with lower extremity Doppler ultrasound to assess for DVT. These  results will be called to the ordering clinician or representative by the Radiologist Assistant, and communication documented in the PACS or [employer]. Electronically Signed   By: Richardean Sale M.D.   On: 05/12/2021 13:05   US Venous Img Lower Bilateral  Result Date: 05/12/2021 CLINICAL DATA:  Evaluate for DVT EXAM: BILATERAL LOWER EXTREMITY VENOUS DOPPLER ULTRASOUND TECHNIQUE: Gray-scale sonography with graded compression, as well as color Doppler and duplex ultrasound were performed to evaluate the lower extremity deep venous systems from the level of the common femoral vein and including the common femoral, femoral, profunda femoral, popliteal and calf veins including the posterior tibial, peroneal and gastrocnemius veins when visible. The superficial great saphenous vein was also interrogated. Spectral Doppler was utilized to evaluate flow at rest and with distal augmentation maneuvers in the common femoral, femoral and popliteal veins. COMPARISON:  None. FINDINGS: RIGHT LOWER EXTREMITY Common Femoral Vein: No evidence of thrombus. Normal compressibility, respiratory phasicity and response to augmentation. Saphenofemoral Junction: No evidence of thrombus. Normal compressibility and flow on color Doppler imaging. Profunda Femoral Vein: No evidence of thrombus. Normal compressibility and flow on color Doppler imaging. Femoral Vein: No evidence of thrombus. Normal compressibility, respiratory phasicity and response to augmentation. Popliteal Vein: No evidence of thrombus. Normal compressibility, respiratory phasicity and response to augmentation. Calf Veins: No evidence of thrombus. Normal compressibility and flow on color Doppler imaging. Superficial Great Saphenous Vein: No evidence of thrombus. Normal compressibility. Venous Reflux:  None. Other Findings:  None. LEFT LOWER EXTREMITY Common Femoral Vein: No evidence of thrombus. Normal compressibility, respiratory phasicity and response to  augmentation. Saphenofemoral Junction: No evidence of thrombus. Normal compressibility and flow on color Doppler imaging. Profunda Femoral Vein: No evidence of thrombus. Normal compressibility and flow on color Doppler imaging. Femoral Vein: No evidence of thrombus. Normal compressibility, respiratory phasicity and response to augmentation. Popliteal Vein: No evidence of thrombus. Normal compressibility, respiratory phasicity and response to augmentation. Calf Veins: No evidence of thrombus. Normal compressibility and flow on color Doppler imaging. Somewhat limited visualization of the left peroneal veins due to surgical pin above left ankle. Superficial Great Saphenous Vein: No evidence of thrombus. Normal compressibility. Venous Reflux:  None. Other Findings:  None. IMPRESSION: No evidence of deep venous thrombosis in either lower extremity. Somewhat limited visualization of the left peroneal veins due to surgical pin above left ankle. Electronically Signed   By: Yetta Glassman M.D.   On: 05/12/2021 15:47   CT CHEST ABDOMEN PELVIS WO CONTRAST  Result Date: 05/04/2021 CLINICAL DATA:  Renal cell carcinoma restaging, status post partial left nephrectomy with suspected local recurrence and metastatic disease EXAM: CT CHEST, ABDOMEN AND PELVIS WITHOUT CONTRAST TECHNIQUE: Multidetector CT imaging of the chest, abdomen and pelvis was performed following the standard protocol without IV contrast. COMPARISON:  CT chest abdomen pelvis, 12/29/2020, MR abdomen, 10/10/2020 FINDINGS: CT CHEST FINDINGS Cardiovascular: Scattered aortic atherosclerosis. Normal heart size. Three-vessel coronary artery calcifications. No pericardial effusion. Mediastinum/Nodes: No enlarged mediastinal, hilar, or axillary lymph nodes. Thyroid gland, trachea, and esophagus demonstrate no significant findings. Lungs/Pleura: Mild centrilobular and paraseptal emphysema. There is a new nodule or consolidation of the posterior peripheral lingula  abutting the fissure, measuring 2.9 x 2.8 cm (series 4, image 72). There is a new, flattened subpleural nodular consolidation of the anterior medial segment right middle lobe, with some evidence of central clearing, measuring 4.8 x 3.4 cm (series 4, image 86). No pleural  effusion or pneumothorax. Musculoskeletal: No chest wall mass or suspicious bone lesions identified. CT ABDOMEN PELVIS FINDINGS Hepatobiliary: No solid liver abnormality is seen. Rim calcified gallstone in the dependent gallbladder. No gallbladder wall thickening or biliary dilatation. Pancreas: Unremarkable. No pancreatic ductal dilatation or surrounding inflammatory changes. Spleen: Normal in size without significant abnormality. Adrenals/Urinary Tract: Adrenal glands are unremarkable. Interval decrease in size of a slightly hyperattenuating nodule of the inferior pole of the left kidney, measuring approximately 1.9 x 1.8 cm, previously 2.7 x 2.1 cm (series 2, image 71). Bladder is unremarkable. Stomach/Bowel: Stomach is within normal limits. Appendix appears normal. No evidence of bowel wall thickening, distention, or inflammatory changes. Pancolonic diverticulosis. Vascular/Lymphatic: Aortic atherosclerosis. Significant interval decrease in size in enlarged left retroperitoneal lymph nodes, largest node measuring 1.5 x 1.2 cm, previously 3.0 x 2.7 cm (series 2, image 71). Reproductive: Mild prostatomegaly. Other: Fat containing bilateral inguinal hernias. No abdominopelvic ascites. Soft tissue nodules about the left retroperitoneum and left paracolic gutter are stable or diminished compared to prior examination, for example a nodule adjacent to the inferior pole of the left kidney and adjacent descending colon, which is difficult to discretely appreciate on current examination although new measuring no greater than 0.5 cm, previously 1.0 cm (series 2, image 71). Additional nodules of the left retroperitoneum and paracolic gutter are stable  (series 2, image 95). Musculoskeletal: No acute or significant osseous findings. IMPRESSION: 1. Interval decrease in size of a slightly hyperattenuating nodule of the inferior pole of the left kidney, consistent with treatment response of locally recurrent disease, better characterized by prior contrast enhanced MR. 2. Significant interval decrease in size in enlarged left retroperitoneal lymph nodes, consistent with treatment response of nodal metastatic disease. 3. Soft tissue nodules about the left retroperitoneum and left paracolic gutter are stable or diminished compared to prior examination, consistent with stable or improved soft tissue metastases. 4. There is a new nodule or consolidation of the posterior peripheral lingula abutting the fissure, measuring 2.9 x 2.8 cm, as well as a new, flattened subpleural nodular consolidation of the anterior medial segment right middle lobe, with some evidence of central clearing, measuring 4.8 x 3.4 cm. Although lung metastases are a significant differential consideration, morphologically, these findings do not favor metastasis and are suggestive of pulmonary infarctions. Consider CT angiogram to exclude pulmonary embolism. 5. Cholelithiasis. 6. Emphysema. 7. Coronary artery disease. These results will be called to the ordering clinician or representative by the Radiologist Assistant, and communication documented in the PACS or [employer]. Aortic Atherosclerosis (ICD10-I70.0) and Emphysema (ICD10-J43.9). Electronically Signed   By: Eddie Candle M.D.   On: 05/04/2021 16:44        ASSESSMENT & PLAN:  1. Cancer of kidney, left ([name])   2. Stage 4 chronic kidney disease ([place])   3. Encounter for antineoplastic chemotherapy   4. Anemia due to stage 4 chronic kidney disease ([place])   5. Other acute pulmonary embolism without acute cor pulmonale ([place])   Cancer Staging Cancer of kidney, left [facility]) Staging form: Kidney, AJCC 8th Edition - Clinical stage from  11/28/2020: Stage III (rcT1a, cN1, cM0) - Signed by Earlie Server, MD on 11/28/2020   Recurrent left papillary renal cell carcinoma, with nodal involvement- SBRT 11/2020 by Dr.Manning. Patient declines surgery.  Labs are reviewed and discussed with patient. 05/04/2021 CT showed treatment response. Continue cabozantinib 40 mg daily.  # acute pulmonary embolism Continue Eliquis 5mg  BID  Hypertension, continue follow-up with primary care provider.  BP is  controlled. Bradycardia, recommend patient to discuss with PCP. He is asymptomatic.   Diarrhea, Recommend imodium PRN as instructed.   CKD stage IV, Encourage oral hydration. SPEP negative, light chain ratio was slightly elevated, -expected in CKD. Creatinine is stable.  Anemia, multifactorial, CKD, chemotherapy  Follow up in 4 weeks.   Orders Placed This Encounter  Procedures   CBC with Differential/Platelet    Standing Status:   Future    Standing Expiration Date:   05/26/2022   Comprehensive metabolic panel    Standing Status:   Future    Standing Expiration Date:   05/26/2022    All questions were answered. The patient knows to call the clinic with any problems questions or concerns.  cc Jodi Marble, MD    Return of visit: 4 weeks  Earlie Server, MD, PhD Hematology Oncology [facility] Pager- [phone] 05/26/2021

## 2021-05-31 ENCOUNTER — Other Ambulatory Visit (HOSPITAL_COMMUNITY): Payer: Self-pay

## 2021-06-01 ENCOUNTER — Other Ambulatory Visit (HOSPITAL_COMMUNITY): Payer: Self-pay

## 2021-06-14 DIAGNOSIS — N184 Chronic kidney disease, stage 4 (severe): Secondary | ICD-10-CM | POA: Diagnosis not present

## 2021-06-14 DIAGNOSIS — E119 Type 2 diabetes mellitus without complications: Secondary | ICD-10-CM | POA: Diagnosis not present

## 2021-06-19 DIAGNOSIS — E119 Type 2 diabetes mellitus without complications: Secondary | ICD-10-CM | POA: Diagnosis not present

## 2021-06-19 DIAGNOSIS — N184 Chronic kidney disease, stage 4 (severe): Secondary | ICD-10-CM | POA: Diagnosis not present

## 2021-06-19 DIAGNOSIS — C649 Malignant neoplasm of unspecified kidney, except renal pelvis: Secondary | ICD-10-CM | POA: Diagnosis not present

## 2021-06-19 DIAGNOSIS — I10 Essential (primary) hypertension: Secondary | ICD-10-CM | POA: Diagnosis not present

## 2021-06-21 DIAGNOSIS — N2889 Other specified disorders of kidney and ureter: Secondary | ICD-10-CM | POA: Diagnosis not present

## 2021-06-21 DIAGNOSIS — R809 Proteinuria, unspecified: Secondary | ICD-10-CM | POA: Diagnosis not present

## 2021-06-21 DIAGNOSIS — E1122 Type 2 diabetes mellitus with diabetic chronic kidney disease: Secondary | ICD-10-CM | POA: Diagnosis not present

## 2021-06-21 DIAGNOSIS — N184 Chronic kidney disease, stage 4 (severe): Secondary | ICD-10-CM | POA: Diagnosis not present

## 2021-06-21 DIAGNOSIS — I129 Hypertensive chronic kidney disease with stage 1 through stage 4 chronic kidney disease, or unspecified chronic kidney disease: Secondary | ICD-10-CM | POA: Diagnosis not present

## 2021-06-23 ENCOUNTER — Inpatient Hospital Stay (HOSPITAL_BASED_OUTPATIENT_CLINIC_OR_DEPARTMENT_OTHER): Payer: Medicare Other | Admitting: Oncology

## 2021-06-23 ENCOUNTER — Other Ambulatory Visit: Payer: Self-pay

## 2021-06-23 ENCOUNTER — Encounter: Payer: Self-pay | Admitting: Oncology

## 2021-06-23 ENCOUNTER — Inpatient Hospital Stay: Payer: Medicare Other | Attending: Oncology

## 2021-06-23 VITALS — BP 164/105 | HR 48 | Temp 96.9°F | Wt 225.5 lb

## 2021-06-23 DIAGNOSIS — Z5111 Encounter for antineoplastic chemotherapy: Secondary | ICD-10-CM | POA: Diagnosis not present

## 2021-06-23 DIAGNOSIS — I129 Hypertensive chronic kidney disease with stage 1 through stage 4 chronic kidney disease, or unspecified chronic kidney disease: Secondary | ICD-10-CM | POA: Diagnosis not present

## 2021-06-23 DIAGNOSIS — C642 Malignant neoplasm of left kidney, except renal pelvis: Secondary | ICD-10-CM | POA: Diagnosis not present

## 2021-06-23 DIAGNOSIS — D631 Anemia in chronic kidney disease: Secondary | ICD-10-CM | POA: Diagnosis not present

## 2021-06-23 DIAGNOSIS — Z7901 Long term (current) use of anticoagulants: Secondary | ICD-10-CM | POA: Diagnosis not present

## 2021-06-23 DIAGNOSIS — I2699 Other pulmonary embolism without acute cor pulmonale: Secondary | ICD-10-CM | POA: Insufficient documentation

## 2021-06-23 DIAGNOSIS — N184 Chronic kidney disease, stage 4 (severe): Secondary | ICD-10-CM | POA: Diagnosis not present

## 2021-06-23 LAB — CBC WITH DIFFERENTIAL/PLATELET
Abs Immature Granulocytes: 0.02 10*3/uL (ref 0.00–0.07)
Basophils Absolute: 0 10*3/uL (ref 0.0–0.1)
Basophils Relative: 1 %
Eosinophils Absolute: 0 10*3/uL (ref 0.0–0.5)
Eosinophils Relative: 0 %
HCT: 35.8 % — ABNORMAL LOW (ref 39.0–52.0)
Hemoglobin: 12.3 g/dL — ABNORMAL LOW (ref 13.0–17.0)
Immature Granulocytes: 1 %
Lymphocytes Relative: 27 %
Lymphs Abs: 1.2 10*3/uL (ref 0.7–4.0)
MCH: 34 pg (ref 26.0–34.0)
MCHC: 34.4 g/dL (ref 30.0–36.0)
MCV: 98.9 fL (ref 80.0–100.0)
Monocytes Absolute: 0.4 10*3/uL (ref 0.1–1.0)
Monocytes Relative: 8 %
Neutro Abs: 2.8 10*3/uL (ref 1.7–7.7)
Neutrophils Relative %: 63 %
Platelets: 286 10*3/uL (ref 150–400)
RBC: 3.62 MIL/uL — ABNORMAL LOW (ref 4.22–5.81)
RDW: 18.6 % — ABNORMAL HIGH (ref 11.5–15.5)
WBC: 4.3 10*3/uL (ref 4.0–10.5)
nRBC: 0 % (ref 0.0–0.2)

## 2021-06-23 LAB — COMPREHENSIVE METABOLIC PANEL
ALT: 26 U/L (ref 0–44)
AST: 25 U/L (ref 15–41)
Albumin: 3.7 g/dL (ref 3.5–5.0)
Alkaline Phosphatase: 54 U/L (ref 38–126)
Anion gap: 5 (ref 5–15)
BUN: 26 mg/dL — ABNORMAL HIGH (ref 8–23)
CO2: 27 mmol/L (ref 22–32)
Calcium: 8.6 mg/dL — ABNORMAL LOW (ref 8.9–10.3)
Chloride: 109 mmol/L (ref 98–111)
Creatinine, Ser: 2.82 mg/dL — ABNORMAL HIGH (ref 0.61–1.24)
GFR, Estimated: 23 mL/min — ABNORMAL LOW (ref >60)
Glucose, Bld: 57 mg/dL — ABNORMAL LOW (ref 70–99)
Potassium: 4 mmol/L (ref 3.5–5.1)
Sodium: 141 mmol/L (ref 135–145)
Total Bilirubin: 0.6 mg/dL (ref 0.3–1.2)
Total Protein: 7.1 g/dL (ref 6.5–8.1)

## 2021-06-23 NOTE — Progress Notes (Signed)
Hematology/Oncology follow up note [facility] [phone] [phone]   Patient Care Team: Jodi Marble, MD as PCP - General (Internal Medicine) Marden Noble, MD (Internal Medicine) Tyler Pita, MD as Consulting Physician (Radiation Oncology)  REFERRING PROVIDER: Jodi Marble, MD  CHIEF COMPLAINTS/REASON FOR VISIT:  Follow-up for treatment of recurrent RCC  HISTORY OF PRESENTING ILLNESS:   Riley Stuart is a  70 y.o.  male with PMH listed below was seen in consultation at the request of  Jodi Marble, MD  for evaluation of [place] Patient has schizoaffective disorder and lives in the group home.  He is quite functional at baseline and makes his own medical decision. 11/27/2018 CT abdomen pelvis without contrast showed solid-appearing mass of the lower pole of the left kidney measuring up to 6.8 cm concerning for RCC. 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 08/10/2019, patient underwent partial left nephrectomy and results showed papillary renal cell carcinoma, type II, nuclear grade 3.  Size 9 cm.  Tumor invades perirenal fat (pT3a). Patient went image surveillance. 10/10/2020 , MRI abdomen with and without contrast showed new 2.4 x 1.8 cm faintly enhancing nodularity along the left kidney lower pole partial nephrectomy sites.  Suspicious for local recurrence.  New pathologically enlarged left periaortic lymph node, highly suspicious for malignancy in this context.  Numerous bilateral renal cysts.:  Diverticulosis lumbar spondylosis and degenerative disease 1.4 gallstone in the gallbladder.  Per note, urology Dr. Alinda Money has offered patient definitive surgical resection as well as regional lymph node dissection however patient declined the surgery due to his chronic kidney insufficiency and potential need of requiring dialysis after radical nephrectomy.  12/09/2020 status post SBRT to the  periaortic lymph node radiation oncology Dr. Tammi Klippel .  He was also seen by Dr. Pascal Lux for possible cryoablation.  Dr. Pascal Lux recommend repeat CT abdomen which has been ordered.  # 12/15/2020, CT abdomen without contrast showed nodular changes of the left retroperitoneum in the surgical bed suspicious for areas of disease recurrence in the left retroperitoneum and along the left flank.  Signs of increased soft tissue density about the inferior left renal sinus fat bowel following partial nephrectomy. Other scattered small nodules seen inferiorly along the plane of the sigmoid mesocolon not present on preoperative evaluations may serve as additional evidence of disease.    # Initially there was plan for cryoablation.  Patient was evaluated by IR Dr. Pascal Lux, however due to the disease progression on recent CT, Dr. Pascal Lux does not feel that patient will benefit from cryoablation at this point.   01/20/2021, started on cabozantinib 40 mg daily.  INTERVAL HISTORY Riley Stuart is a 70 y.o. male who has above history reviewed by me today presents for follow up visit for management of recurrent RCC Problems and complaints are listed below: He reports taking well. He is on Cabozantinib 40mg  daily.  Patient was accompanied by facility staff. Patient recently had adjustment of blood pressure medication.  Started on olmesartan. He is also on Eliquis 5 mg twice daily, tolerates well Patient denies any diarrhea No shortness of breath, chest pain, lower extremity swelling or bleeding   Review of Systems  Constitutional:  Negative for appetite change, chills, fatigue, fever and unexpected weight change.  HENT:   Negative for hearing loss and voice change.   Eyes:  Negative for eye problems and icterus.  Respiratory:  Negative for chest tightness, cough and shortness of breath.   Cardiovascular:  Negative for chest pain and leg swelling.  Gastrointestinal:  Negative for abdominal distention and abdominal  pain.  Endocrine: Negative for hot flashes.  Genitourinary:  Negative for difficulty urinating, dysuria and frequency.   Musculoskeletal:  Negative for arthralgias.  Skin:  Negative for itching and rash.  Neurological:  Negative for light-headedness and numbness.  Hematological:  Negative for adenopathy. Does not bruise/bleed easily.  Psychiatric/Behavioral:  Negative for confusion.    MEDICAL HISTORY:  Past Medical History:  Diagnosis Date   Chronic kidney disease    renal insufficiency   Diabetes mellitus without complication ([place])    Pt takes Insulin   Hypertension    Mental disorder    schizoaffective   Renal cell carcinoma ([place])    Wears dentures    full upper and lower    SURGICAL HISTORY: Past Surgical History:  Procedure Laterality Date   ANKLE FRACTURE SURGERY     CATARACT EXTRACTION W/PHACO Left 03/08/2021   Procedure: CATARACT EXTRACTION PHACO AND INTRAOCULAR LENS PLACEMENT ([place]) LEFT DIABETIC 8.48 01:06.9;  Surgeon: Leandrew Koyanagi, MD;  Location: [place];  Service: Ophthalmology;  Laterality: Left;  Diabetic - insulin   CATARACT EXTRACTION W/PHACO Right 03/22/2021   Procedure: CATARACT EXTRACTION PHACO AND INTRAOCULAR LENS PLACEMENT ([facility]) RIGHT DIABETIC 6.14 01:08.8;  Surgeon: Leandrew Koyanagi, MD;  Location: [place];  Service: Ophthalmology;  Laterality: Right;  Diabetic - insulin   COLONOSCOPY WITH PROPOFOL N/A 04/15/2020   Procedure: COLONOSCOPY WITH PROPOFOL;  Surgeon: Virgel Manifold, MD;  Location: [facility];  Service: Endoscopy;  Laterality: N/A;   CRANIOTOMY Left 10/27/2013   Procedure: Craniotomy for Aneurysm Clipping;  Surgeon: Consuella Lose, MD;  Location: [facility];  Service: Neurosurgery;  Laterality: Left;   IR RADIOLOGIST EVAL & MGMT  11/22/2020   IR RADIOLOGIST EVAL & MGMT  12/21/2020   KNEE SURGERY     due to fracture.    ROBOTIC ASSITED PARTIAL NEPHRECTOMY Left 08/10/2019   Procedure: XI ROBOTIC ASSITED  LAPAROSCOPIC  PARTIAL NEPHRECTOMY;  Surgeon: Raynelle Bring, MD;  Location: [facility];  Service: Urology;  Laterality: Left;    SOCIAL HISTORY: Social History   Socioeconomic History   Marital status: Single    Spouse name: Not on file   Number of children: 0   Years of education: Not on file   Highest education level: Not on file  Occupational History   Not on file  Tobacco Use   Smoking status: Every Day    Packs/day: 0.50    Years: 48.00    Pack years: 24.00    Types: Cigarettes   Smokeless tobacco: Never  Vaping Use   Vaping Use: Never used  Substance and Sexual Activity   Alcohol use: Not Currently   Drug use: Never   Sexual activity: Not Currently  Other Topics Concern   Not on file  Social History Narrative   Not on file   Social Determinants of Health   Financial Resource Strain: Not on file  Food Insecurity: Not on file  Transportation Needs: Not on file  Physical Activity: Not on file  Stress: Not on file  Social Connections: Not on file  Intimate Partner Violence: Not on file    FAMILY HISTORY: Family History  Problem Relation Age of Onset   Breast cancer Mother    Throat cancer Brother    Prostate cancer Neg Hx    Colon cancer Neg Hx    Pancreatic cancer Neg Hx     ALLERGIES:  has No Known Allergies.  MEDICATIONS:  Current Outpatient Medications  Medication Sig Dispense Refill   acetaminophen (TYLENOL) 650 MG CR tablet Take by mouth.     amLODipine-valsartan (EXFORGE) 10-320 MG tablet Take 1 tablet by mouth daily.     apixaban (ELIQUIS) 5 MG TABS tablet Take 1 tablet (5 mg total) by mouth 2 (two) times daily. 60 tablet 2   APIXABAN (ELIQUIS) VTE STARTER PACK (10MG  AND 5MG ) Take as directed on package: start with two-5mg  tablets twice daily for 7 days. On day 8, switch to one-5mg  tablet twice daily. 1 each 0   atorvastatin (LIPITOR) 10 MG tablet Take 1 tablet by mouth daily.     cabozantinib (CABOMETYX) 40 MG tablet Take 1 tablet (40 mg total) by  mouth daily. Take on an empty stomach, 1 hour before or 2 hours after meals. 30 tablet 1   calcium carbonate (OS-CAL) 600 MG TABS tablet Take 2 tablets (1,200 mg total) by mouth 2 (two) times daily with a meal. 60 tablet 3   carvedilol (COREG) 25 MG tablet Take 25 mg by mouth 2 (two) times daily.     DUREZOL 0.05 % EMUL Place 1 drop into the right eye 2 (two) times daily.     fenofibrate (TRICOR) 145 MG tablet Take 145 mg by mouth daily.     Insulin Lispro Prot & Lispro (HUMALOG 75/25 MIX) (75-25) 100 UNIT/ML Kwikpen Inject into the skin.     lamoTRIgine (LAMICTAL) 100 MG tablet Take 100 mg by mouth 2 (two) times daily.     loperamide (IMODIUM A-D) 2 MG tablet Take 1 tablet (2 mg total) by mouth See admin instructions. Take 4mg  with the onset of diarrhea, then take 2mg  after every loose bowel movements. Maximum 16mg  per 24 hours. 90 tablet 1   omeprazole (PRILOSEC) 40 MG capsule Take 40 mg by mouth daily.     ondansetron (ZOFRAN) 8 MG tablet Take 1 tablet (8 mg total) by mouth every 8 (eight) hours as needed for nausea or vomiting. 20 tablet 0   tamsulosin (FLOMAX) 0.4 MG CAPS capsule Take 0.4 mg by mouth daily.      ziprasidone (GEODON) 80 MG capsule Take 80 mg by mouth 2 (two) times daily.     furosemide (LASIX) 20 MG tablet Take 0.5 tablets (10 mg total) by mouth daily. (Patient not taking: No sig reported) 30 tablet 0   NOVOLOG MIX 70/30 FLEXPEN (70-30) 100 UNIT/ML FlexPen Inject 30 Units as directed daily. (Patient not taking: No sig reported)     olmesartan (BENICAR) 20 MG tablet Take 20 mg by mouth daily. 1 Tablet(s) By Mouth Daily     ULTICARE PEN NEEDLES 29G X 12.7MM MISC  (Patient not taking: No sig reported)     Valbenazine Tosylate (INGREZZA) 40 MG CAPS Take 40 mg by mouth daily.  (Patient not taking: No sig reported)     No current facility-administered medications for this visit.     PHYSICAL EXAMINATION: ECOG PERFORMANCE STATUS: 1 - Symptomatic but completely ambulatory Vitals:    06/23/21 1023  BP: (!) 164/105  Pulse: (!) 48  Temp: (!) 96.9 F (36.1 C)   Filed Weights   06/23/21 1023  Weight: 225 lb 8 oz (102.3 kg)    Physical Exam Constitutional:      General: He is not in acute distress.    Comments: He ambulates independently  HENT:     Head: Normocephalic and atraumatic.  Eyes:     General:  No scleral icterus. Cardiovascular:     Rate and Rhythm: Normal rate and regular rhythm.     Heart sounds: Normal heart sounds.  Pulmonary:     Effort: Pulmonary effort is normal. No respiratory distress.     Breath sounds: No wheezing.  Abdominal:     General: Bowel sounds are normal. There is no distension.     Palpations: Abdomen is soft.  Musculoskeletal:        General: No deformity. Normal range of motion.     Cervical back: Normal range of motion and neck supple.  Skin:    General: Skin is warm and dry.     Findings: No erythema or rash.  Neurological:     Mental Status: He is alert and oriented to person, place, and time. Mental status is at baseline.     Comments: Speech is slow  Psychiatric:        Mood and Affect: Mood normal.    LABORATORY DATA:  I have reviewed the data as listed Lab Results  Component Value Date   WBC 4.3 06/23/2021   HGB 12.3 (L) 06/23/2021   HCT 35.8 (L) 06/23/2021   MCV 98.9 06/23/2021   PLT 286 06/23/2021   Recent Labs    05/12/21 0952 05/26/21 1143 06/23/21 0938  NA 142 142 141  K 4.2 3.8 4.0  CL 110 110 109  CO2 22 25 27   GLUCOSE 86 106* 57*  BUN 33* 37* 26*  CREATININE 3.72* 3.30* 2.82*  CALCIUM 8.4* 8.1* 8.6*  GFRNONAA 17* 19* 23*  PROT 6.9 6.7 7.1  ALBUMIN 3.5 3.5 3.7  AST 24 25 25   ALT 21 21 26   ALKPHOS 46 50 54  BILITOT 0.5 0.4 0.6    Iron/TIBC/Ferritin/ %Sat No results found for: IRON, TIBC, FERRITIN, IRONPCTSAT    RADIOGRAPHIC STUDIES: I have personally reviewed the radiological images as listed and agreed with the findings in the report. DG Chest 2 View  Result Date:  05/12/2021 CLINICAL DATA:  Post NM pulmonary profusion EXAM: CHEST - 2 VIEW COMPARISON:  CT 05/04/2021 FINDINGS: Subsegmental atelectasis/consolidation in the right middle lobe. Nodular opacity laterally in the lingula as seen on previous CT. No new airspace opacity. No overt edema. Heart size and mediastinal contours are within normal limits. No effusion.  No pneumothorax. Visualized bones unremarkable. IMPRESSION: 1. Lingular nodular opacity and right middle lobe atelectasis/consolidation, without convincing change compared to prior CT. Electronically Signed   By: Lucrezia Europe M.D.   On: 05/12/2021 13:52   NM Pulmonary Perfusion  Result Date: 05/12/2021 CLINICAL DATA:  Shortness of breath. History of renal cell carcinoma. Recent noncontrast chest CT demonstrated lingular and right middle lobe opacity suspicious for pulmonary infarcts. Question pulmonary embolism. EXAM: NUCLEAR MEDICINE PERFUSION LUNG SCAN TECHNIQUE: Perfusion images were obtained in multiple projections after intravenous injection of radiopharmaceutical. Ventilation scans intentionally deferred if perfusion scan and chest x-ray adequate for interpretation during COVID 19 epidemic. RADIOPHARMACEUTICALS:  4.08 mCi Tc-2m MAA IV COMPARISON:  Chest CT 05/04/2021. FINDINGS: The pulmonary perfusion is diffusely heterogeneous with multiple small to moderate perfusion defects, most notably posteriorly in the right upper lobe and within the lingula. Without a ventilation scan, the etiology for these perfusion defects is indeterminate. IMPRESSION: There are multiple perfusion defects in both lungs which are indeterminate without a ventilatory examination. Findings could definitely reflect the sequela of recent pulmonary embolism based on recent noncontrast CT. If the patient is unable to undergo chest CTA with contrast, consider further  evaluation with lower extremity Doppler ultrasound to assess for DVT. These results will be called to the ordering  clinician or representative by the Radiologist Assistant, and communication documented in the PACS or [employer]. Electronically Signed   By: Richardean Sale M.D.   On: 05/12/2021 13:05   US Venous Img Lower Bilateral  Result Date: 05/12/2021 CLINICAL DATA:  Evaluate for DVT EXAM: BILATERAL LOWER EXTREMITY VENOUS DOPPLER ULTRASOUND TECHNIQUE: Gray-scale sonography with graded compression, as well as color Doppler and duplex ultrasound were performed to evaluate the lower extremity deep venous systems from the level of the common femoral vein and including the common femoral, femoral, profunda femoral, popliteal and calf veins including the posterior tibial, peroneal and gastrocnemius veins when visible. The superficial great saphenous vein was also interrogated. Spectral Doppler was utilized to evaluate flow at rest and with distal augmentation maneuvers in the common femoral, femoral and popliteal veins. COMPARISON:  None. FINDINGS: RIGHT LOWER EXTREMITY Common Femoral Vein: No evidence of thrombus. Normal compressibility, respiratory phasicity and response to augmentation. Saphenofemoral Junction: No evidence of thrombus. Normal compressibility and flow on color Doppler imaging. Profunda Femoral Vein: No evidence of thrombus. Normal compressibility and flow on color Doppler imaging. Femoral Vein: No evidence of thrombus. Normal compressibility, respiratory phasicity and response to augmentation. Popliteal Vein: No evidence of thrombus. Normal compressibility, respiratory phasicity and response to augmentation. Calf Veins: No evidence of thrombus. Normal compressibility and flow on color Doppler imaging. Superficial Great Saphenous Vein: No evidence of thrombus. Normal compressibility. Venous Reflux:  None. Other Findings:  None. LEFT LOWER EXTREMITY Common Femoral Vein: No evidence of thrombus. Normal compressibility, respiratory phasicity and response to augmentation. Saphenofemoral Junction: No  evidence of thrombus. Normal compressibility and flow on color Doppler imaging. Profunda Femoral Vein: No evidence of thrombus. Normal compressibility and flow on color Doppler imaging. Femoral Vein: No evidence of thrombus. Normal compressibility, respiratory phasicity and response to augmentation. Popliteal Vein: No evidence of thrombus. Normal compressibility, respiratory phasicity and response to augmentation. Calf Veins: No evidence of thrombus. Normal compressibility and flow on color Doppler imaging. Somewhat limited visualization of the left peroneal veins due to surgical pin above left ankle. Superficial Great Saphenous Vein: No evidence of thrombus. Normal compressibility. Venous Reflux:  None. Other Findings:  None. IMPRESSION: No evidence of deep venous thrombosis in either lower extremity. Somewhat limited visualization of the left peroneal veins due to surgical pin above left ankle. Electronically Signed   By: Yetta Glassman M.D.   On: 05/12/2021 15:47   CT CHEST ABDOMEN PELVIS WO CONTRAST  Result Date: 05/04/2021 CLINICAL DATA:  Renal cell carcinoma restaging, status post partial left nephrectomy with suspected local recurrence and metastatic disease EXAM: CT CHEST, ABDOMEN AND PELVIS WITHOUT CONTRAST TECHNIQUE: Multidetector CT imaging of the chest, abdomen and pelvis was performed following the standard protocol without IV contrast. COMPARISON:  CT chest abdomen pelvis, 12/29/2020, MR abdomen, 10/10/2020 FINDINGS: CT CHEST FINDINGS Cardiovascular: Scattered aortic atherosclerosis. Normal heart size. Three-vessel coronary artery calcifications. No pericardial effusion. Mediastinum/Nodes: No enlarged mediastinal, hilar, or axillary lymph nodes. Thyroid gland, trachea, and esophagus demonstrate no significant findings. Lungs/Pleura: Mild centrilobular and paraseptal emphysema. There is a new nodule or consolidation of the posterior peripheral lingula abutting the fissure, measuring 2.9 x 2.8 cm  (series 4, image 72). There is a new, flattened subpleural nodular consolidation of the anterior medial segment right middle lobe, with some evidence of central clearing, measuring 4.8 x 3.4 cm (series 4, image 86). No pleural effusion  or pneumothorax. Musculoskeletal: No chest wall mass or suspicious bone lesions identified. CT ABDOMEN PELVIS FINDINGS Hepatobiliary: No solid liver abnormality is seen. Rim calcified gallstone in the dependent gallbladder. No gallbladder wall thickening or biliary dilatation. Pancreas: Unremarkable. No pancreatic ductal dilatation or surrounding inflammatory changes. Spleen: Normal in size without significant abnormality. Adrenals/Urinary Tract: Adrenal glands are unremarkable. Interval decrease in size of a slightly hyperattenuating nodule of the inferior pole of the left kidney, measuring approximately 1.9 x 1.8 cm, previously 2.7 x 2.1 cm (series 2, image 71). Bladder is unremarkable. Stomach/Bowel: Stomach is within normal limits. Appendix appears normal. No evidence of bowel wall thickening, distention, or inflammatory changes. Pancolonic diverticulosis. Vascular/Lymphatic: Aortic atherosclerosis. Significant interval decrease in size in enlarged left retroperitoneal lymph nodes, largest node measuring 1.5 x 1.2 cm, previously 3.0 x 2.7 cm (series 2, image 71). Reproductive: Mild prostatomegaly. Other: Fat containing bilateral inguinal hernias. No abdominopelvic ascites. Soft tissue nodules about the left retroperitoneum and left paracolic gutter are stable or diminished compared to prior examination, for example a nodule adjacent to the inferior pole of the left kidney and adjacent descending colon, which is difficult to discretely appreciate on current examination although new measuring no greater than 0.5 cm, previously 1.0 cm (series 2, image 71). Additional nodules of the left retroperitoneum and paracolic gutter are stable (series 2, image 95). Musculoskeletal: No acute or  significant osseous findings. IMPRESSION: 1. Interval decrease in size of a slightly hyperattenuating nodule of the inferior pole of the left kidney, consistent with treatment response of locally recurrent disease, better characterized by prior contrast enhanced MR. 2. Significant interval decrease in size in enlarged left retroperitoneal lymph nodes, consistent with treatment response of nodal metastatic disease. 3. Soft tissue nodules about the left retroperitoneum and left paracolic gutter are stable or diminished compared to prior examination, consistent with stable or improved soft tissue metastases. 4. There is a new nodule or consolidation of the posterior peripheral lingula abutting the fissure, measuring 2.9 x 2.8 cm, as well as a new, flattened subpleural nodular consolidation of the anterior medial segment right middle lobe, with some evidence of central clearing, measuring 4.8 x 3.4 cm. Although lung metastases are a significant differential consideration, morphologically, these findings do not favor metastasis and are suggestive of pulmonary infarctions. Consider CT angiogram to exclude pulmonary embolism. 5. Cholelithiasis. 6. Emphysema. 7. Coronary artery disease. These results will be called to the ordering clinician or representative by the Radiologist Assistant, and communication documented in the PACS or [employer]. Aortic Atherosclerosis (ICD10-I70.0) and Emphysema (ICD10-J43.9). Electronically Signed   By: Eddie Candle M.D.   On: 05/04/2021 16:44        ASSESSMENT & PLAN:  1. Encounter for antineoplastic chemotherapy   2. Other acute pulmonary embolism without acute cor pulmonale ([name])   3. Cancer of kidney, left ([place])   4. Anemia due to stage 4 chronic kidney disease [facility])   Cancer Staging Cancer of kidney, left [name]) Staging form: Kidney, AJCC 8th Edition - Clinical stage from 11/28/2020: Stage III (rcT1a, cN1, cM0) - Signed by Earlie Server, MD on 11/28/2020   Recurrent left  papillary renal cell carcinoma, with nodal involvement- SBRT 11/2020 by Dr.Manning. Patient declines surgery.  Labs are reviewed and discussed with patient Continue cabozantinib 40 mg daily.  05/04/2021 CT showed treatment response.   # acute pulmonary embolism Continue Eliquis 5 mg twice daily.  Plan 3 to 6 months of anticoagulation and switch to low-dose for prophylaxis.  Hypertension, cabozantinib  40 mg daily follow-up with primary care provider.  Blood pressure slightly high today.  Appreciate primary care provider input.  Diarrhea, Recommend imodium PRN as instructed.  Symptoms has resolved.  CKD stage IV, Encourage oral hydration. SPEP negative, light chain ratio was slightly elevated, -expected in CKD. Creatinine is stable.  Anemia, multifactorial, CKD, chemotherapy  Follow up in 6 weeks.   Orders Placed This Encounter  Procedures   CBC with Differential/Platelet    Standing Status:   Future    Standing Expiration Date:   06/23/2022   Comprehensive metabolic panel    Standing Status:   Future    Standing Expiration Date:   06/23/2022    All questions were answered. The patient knows to call the clinic with any problems questions or concerns.  cc Jodi Marble, MD    Return of visit: 4 weeks  Earlie Server, MD, PhD Hematology Oncology [facility] Pager- [phone] 06/23/2021

## 2021-06-26 ENCOUNTER — Other Ambulatory Visit (HOSPITAL_COMMUNITY): Payer: Self-pay

## 2021-06-26 ENCOUNTER — Other Ambulatory Visit: Payer: Self-pay | Admitting: Oncology

## 2021-06-26 DIAGNOSIS — C642 Malignant neoplasm of left kidney, except renal pelvis: Secondary | ICD-10-CM

## 2021-06-26 MED ORDER — CABOMETYX 40 MG PO TABS
40.0000 mg | ORAL_TABLET | Freq: Every day | ORAL | 1 refills | Status: DC
  Filled 2021-06-26: qty 30, 30d supply, fill #0
  Filled 2021-08-02: qty 30, 30d supply, fill #1

## 2021-06-27 ENCOUNTER — Other Ambulatory Visit (HOSPITAL_COMMUNITY): Payer: Self-pay

## 2021-06-28 ENCOUNTER — Other Ambulatory Visit (HOSPITAL_COMMUNITY): Payer: Self-pay

## 2021-06-28 DIAGNOSIS — D631 Anemia in chronic kidney disease: Secondary | ICD-10-CM | POA: Diagnosis not present

## 2021-06-28 DIAGNOSIS — N2889 Other specified disorders of kidney and ureter: Secondary | ICD-10-CM | POA: Diagnosis not present

## 2021-06-28 DIAGNOSIS — R809 Proteinuria, unspecified: Secondary | ICD-10-CM | POA: Diagnosis not present

## 2021-06-28 DIAGNOSIS — E1122 Type 2 diabetes mellitus with diabetic chronic kidney disease: Secondary | ICD-10-CM | POA: Diagnosis not present

## 2021-06-28 DIAGNOSIS — N184 Chronic kidney disease, stage 4 (severe): Secondary | ICD-10-CM | POA: Diagnosis not present

## 2021-06-28 DIAGNOSIS — C649 Malignant neoplasm of unspecified kidney, except renal pelvis: Secondary | ICD-10-CM | POA: Diagnosis not present

## 2021-06-28 DIAGNOSIS — I129 Hypertensive chronic kidney disease with stage 1 through stage 4 chronic kidney disease, or unspecified chronic kidney disease: Secondary | ICD-10-CM | POA: Diagnosis not present

## 2021-06-29 DIAGNOSIS — N184 Chronic kidney disease, stage 4 (severe): Secondary | ICD-10-CM | POA: Diagnosis not present

## 2021-07-03 ENCOUNTER — Other Ambulatory Visit (HOSPITAL_COMMUNITY): Payer: Self-pay

## 2021-07-03 DIAGNOSIS — I10 Essential (primary) hypertension: Secondary | ICD-10-CM | POA: Diagnosis not present

## 2021-07-03 DIAGNOSIS — C649 Malignant neoplasm of unspecified kidney, except renal pelvis: Secondary | ICD-10-CM | POA: Diagnosis not present

## 2021-07-03 DIAGNOSIS — N184 Chronic kidney disease, stage 4 (severe): Secondary | ICD-10-CM | POA: Diagnosis not present

## 2021-07-03 DIAGNOSIS — E785 Hyperlipidemia, unspecified: Secondary | ICD-10-CM | POA: Diagnosis not present

## 2021-07-03 DIAGNOSIS — E119 Type 2 diabetes mellitus without complications: Secondary | ICD-10-CM | POA: Diagnosis not present

## 2021-07-24 ENCOUNTER — Other Ambulatory Visit (HOSPITAL_COMMUNITY): Payer: Self-pay

## 2021-07-25 ENCOUNTER — Other Ambulatory Visit (HOSPITAL_COMMUNITY): Payer: Self-pay

## 2021-08-02 ENCOUNTER — Other Ambulatory Visit (HOSPITAL_COMMUNITY): Payer: Self-pay

## 2021-08-03 ENCOUNTER — Inpatient Hospital Stay (HOSPITAL_BASED_OUTPATIENT_CLINIC_OR_DEPARTMENT_OTHER): Payer: Medicare Other | Admitting: Oncology

## 2021-08-03 ENCOUNTER — Other Ambulatory Visit: Payer: Self-pay

## 2021-08-03 ENCOUNTER — Encounter: Payer: Self-pay | Admitting: Oncology

## 2021-08-03 ENCOUNTER — Inpatient Hospital Stay: Payer: Medicare Other | Attending: Oncology

## 2021-08-03 VITALS — BP 158/93 | HR 52 | Temp 97.4°F | Resp 18 | Wt 224.7 lb

## 2021-08-03 DIAGNOSIS — D631 Anemia in chronic kidney disease: Secondary | ICD-10-CM | POA: Insufficient documentation

## 2021-08-03 DIAGNOSIS — C642 Malignant neoplasm of left kidney, except renal pelvis: Secondary | ICD-10-CM

## 2021-08-03 DIAGNOSIS — I2699 Other pulmonary embolism without acute cor pulmonale: Secondary | ICD-10-CM

## 2021-08-03 DIAGNOSIS — K521 Toxic gastroenteritis and colitis: Secondary | ICD-10-CM | POA: Diagnosis not present

## 2021-08-03 DIAGNOSIS — Z5111 Encounter for antineoplastic chemotherapy: Secondary | ICD-10-CM

## 2021-08-03 DIAGNOSIS — I129 Hypertensive chronic kidney disease with stage 1 through stage 4 chronic kidney disease, or unspecified chronic kidney disease: Secondary | ICD-10-CM | POA: Insufficient documentation

## 2021-08-03 DIAGNOSIS — N184 Chronic kidney disease, stage 4 (severe): Secondary | ICD-10-CM | POA: Diagnosis not present

## 2021-08-03 DIAGNOSIS — T451X5A Adverse effect of antineoplastic and immunosuppressive drugs, initial encounter: Secondary | ICD-10-CM | POA: Diagnosis not present

## 2021-08-03 DIAGNOSIS — Z7901 Long term (current) use of anticoagulants: Secondary | ICD-10-CM | POA: Insufficient documentation

## 2021-08-03 LAB — CBC WITH DIFFERENTIAL/PLATELET
Abs Immature Granulocytes: 0.02 10*3/uL (ref 0.00–0.07)
Basophils Absolute: 0 10*3/uL (ref 0.0–0.1)
Basophils Relative: 1 %
Eosinophils Absolute: 0 10*3/uL (ref 0.0–0.5)
Eosinophils Relative: 0 %
HCT: 34.3 % — ABNORMAL LOW (ref 39.0–52.0)
Hemoglobin: 11.9 g/dL — ABNORMAL LOW (ref 13.0–17.0)
Immature Granulocytes: 0 %
Lymphocytes Relative: 25 %
Lymphs Abs: 1.2 10*3/uL (ref 0.7–4.0)
MCH: 34.2 pg — ABNORMAL HIGH (ref 26.0–34.0)
MCHC: 34.7 g/dL (ref 30.0–36.0)
MCV: 98.6 fL (ref 80.0–100.0)
Monocytes Absolute: 0.3 10*3/uL (ref 0.1–1.0)
Monocytes Relative: 7 %
Neutro Abs: 3.1 10*3/uL (ref 1.7–7.7)
Neutrophils Relative %: 67 %
Platelets: 253 10*3/uL (ref 150–400)
RBC: 3.48 MIL/uL — ABNORMAL LOW (ref 4.22–5.81)
RDW: 17.4 % — ABNORMAL HIGH (ref 11.5–15.5)
WBC: 4.7 10*3/uL (ref 4.0–10.5)
nRBC: 0 % (ref 0.0–0.2)

## 2021-08-03 LAB — COMPREHENSIVE METABOLIC PANEL
ALT: 18 U/L (ref 0–44)
AST: 22 U/L (ref 15–41)
Albumin: 3.6 g/dL (ref 3.5–5.0)
Alkaline Phosphatase: 49 U/L (ref 38–126)
Anion gap: 11 (ref 5–15)
BUN: 32 mg/dL — ABNORMAL HIGH (ref 8–23)
CO2: 26 mmol/L (ref 22–32)
Calcium: 8.3 mg/dL — ABNORMAL LOW (ref 8.9–10.3)
Chloride: 106 mmol/L (ref 98–111)
Creatinine, Ser: 3.12 mg/dL — ABNORMAL HIGH (ref 0.61–1.24)
GFR, Estimated: 21 mL/min — ABNORMAL LOW (ref >60)
Glucose, Bld: 91 mg/dL (ref 70–99)
Potassium: 3.9 mmol/L (ref 3.5–5.1)
Sodium: 143 mmol/L (ref 135–145)
Total Bilirubin: 0.5 mg/dL (ref 0.3–1.2)
Total Protein: 6.6 g/dL (ref 6.5–8.1)

## 2021-08-03 NOTE — Progress Notes (Signed)
Patient here for follow up. Pt reports that he had diarrhea, but it is improving.

## 2021-08-03 NOTE — Progress Notes (Signed)
Hematology/Oncology follow up note [phone] [phone]   Patient Care Team: Jodi Marble, MD as PCP - General (Internal Medicine) Marden Noble, MD (Internal Medicine) Tyler Pita, MD as Consulting Physician (Radiation Oncology)  REFERRING PROVIDER: Jodi Marble, MD  CHIEF COMPLAINTS/REASON FOR VISIT:  Follow-up for treatment of recurrent RCC  HISTORY OF PRESENTING ILLNESS:   Riley Stuart is a  70 y.o.  male with PMH listed below was seen in consultation at the request of  Jodi Marble, MD  for evaluation of [place] Patient has schizoaffective disorder and lives in the group home.  He is quite functional at baseline and makes his own medical decision. 11/27/2018 CT abdomen pelvis without contrast showed solid-appearing mass of the lower pole of the left kidney measuring up to 6.8 cm concerning for RCC. 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 08/10/2019, patient underwent partial left nephrectomy and results showed papillary renal cell carcinoma, type II, nuclear grade 3.  Size 9 cm.  Tumor invades perirenal fat (pT3a). Patient went image surveillance. 10/10/2020 , MRI abdomen with and without contrast showed new 2.4 x 1.8 cm faintly enhancing nodularity along the left kidney lower pole partial nephrectomy sites.  Suspicious for local recurrence.  New pathologically enlarged left periaortic lymph node, highly suspicious for malignancy in this context.  Numerous bilateral renal cysts.:  Diverticulosis lumbar spondylosis and degenerative disease 1.4 gallstone in the gallbladder.  Per note, urology Dr. Alinda Money has offered patient definitive surgical resection as well as regional lymph node dissection however patient declined the surgery due to his chronic kidney insufficiency and potential need of requiring dialysis after radical nephrectomy.  12/09/2020 status post SBRT to the periaortic lymph node radiation  oncology Dr. Tammi Klippel .  He was also seen by Dr. Pascal Lux for possible cryoablation.  Dr. Pascal Lux recommend repeat CT abdomen which has been ordered.  # 12/15/2020, CT abdomen without contrast showed nodular changes of the left retroperitoneum in the surgical bed suspicious for areas of disease recurrence in the left retroperitoneum and along the left flank.  Signs of increased soft tissue density about the inferior left renal sinus fat bowel following partial nephrectomy. Other scattered small nodules seen inferiorly along the plane of the sigmoid mesocolon not present on preoperative evaluations may serve as additional evidence of disease.    # Initially there was plan for cryoablation.  Patient was evaluated by IR Dr. Pascal Lux, however due to the disease progression on recent CT, Dr. Pascal Lux does not feel that patient will benefit from cryoablation at this point.   01/20/2021, started on cabozantinib 40 mg daily.  INTERVAL HISTORY Riley Stuart is a 70 y.o. male who has above history reviewed by me today presents for follow up visit for management of recurrent RCC Problems and complaints are listed below: He reports taking well. He is on Cabozantinib 40mg  daily.  Patient was accompanied by facility staff. Denies any nausea vomiting diarrhea.  He is doing well clinically.  Denies any active bleeding events.  No shortness of breath.  Review of Systems  Constitutional:  Negative for appetite change, chills, fatigue, fever and unexpected weight change.  HENT:   Negative for hearing loss and voice change.   Eyes:  Negative for eye problems and icterus.  Respiratory:  Negative for chest tightness, cough and shortness of breath.   Cardiovascular:  Negative for chest pain and leg swelling.  Gastrointestinal:  Negative for abdominal distention and abdominal pain.  Endocrine: Negative  for hot flashes.  Genitourinary:  Negative for difficulty urinating, dysuria and frequency.   Musculoskeletal:  Negative  for arthralgias.  Skin:  Negative for itching and rash.  Neurological:  Negative for light-headedness and numbness.  Hematological:  Negative for adenopathy. Does not bruise/bleed easily.  Psychiatric/Behavioral:  Negative for confusion.    MEDICAL HISTORY:  Past Medical History:  Diagnosis Date   Chronic kidney disease    renal insufficiency   Diabetes mellitus without complication ([place])    Pt takes Insulin   Hypertension    Mental disorder    schizoaffective   Renal cell carcinoma ([place])    Wears dentures    full upper and lower    SURGICAL HISTORY: Past Surgical History:  Procedure Laterality Date   ANKLE FRACTURE SURGERY     CATARACT EXTRACTION W/PHACO Left 03/08/2021   Procedure: CATARACT EXTRACTION PHACO AND INTRAOCULAR LENS PLACEMENT ([place]) LEFT DIABETIC 8.48 01:06.9;  Surgeon: Leandrew Koyanagi, MD;  Location: [place];  Service: Ophthalmology;  Laterality: Left;  Diabetic - insulin   CATARACT EXTRACTION W/PHACO Right 03/22/2021   Procedure: CATARACT EXTRACTION PHACO AND INTRAOCULAR LENS PLACEMENT ([facility]) RIGHT DIABETIC 6.14 01:08.8;  Surgeon: Leandrew Koyanagi, MD;  Location: [place];  Service: Ophthalmology;  Laterality: Right;  Diabetic - insulin   COLONOSCOPY WITH PROPOFOL N/A 04/15/2020   Procedure: COLONOSCOPY WITH PROPOFOL;  Surgeon: Virgel Manifold, MD;  Location: [facility];  Service: Endoscopy;  Laterality: N/A;   CRANIOTOMY Left 10/27/2013   Procedure: Craniotomy for Aneurysm Clipping;  Surgeon: Consuella Lose, MD;  Location: [facility];  Service: Neurosurgery;  Laterality: Left;   IR RADIOLOGIST EVAL & MGMT  11/22/2020   IR RADIOLOGIST EVAL & MGMT  12/21/2020   KNEE SURGERY     due to fracture.    ROBOTIC ASSITED PARTIAL NEPHRECTOMY Left 08/10/2019   Procedure: XI ROBOTIC ASSITED LAPAROSCOPIC  PARTIAL NEPHRECTOMY;  Surgeon: Raynelle Bring, MD;  Location: [facility];  Service: Urology;  Laterality: Left;    SOCIAL  HISTORY: Social History   Socioeconomic History   Marital status: Single    Spouse name: Not on file   Number of children: 0   Years of education: Not on file   Highest education level: Not on file  Occupational History   Not on file  Tobacco Use   Smoking status: Every Day    Packs/day: 0.50    Years: 48.00    Pack years: 24.00    Types: Cigarettes   Smokeless tobacco: Never  Vaping Use   Vaping Use: Never used  Substance and Sexual Activity   Alcohol use: Not Currently   Drug use: Never   Sexual activity: Not Currently  Other Topics Concern   Not on file  Social History Narrative   Not on file   Social Determinants of Health   Financial Resource Strain: Not on file  Food Insecurity: Not on file  Transportation Needs: Not on file  Physical Activity: Not on file  Stress: Not on file  Social Connections: Not on file  Intimate Partner Violence: Not on file    FAMILY HISTORY: Family History  Problem Relation Age of Onset   Breast cancer Mother    Throat cancer Brother    Prostate cancer Neg Hx    Colon cancer Neg Hx    Pancreatic cancer Neg Hx     ALLERGIES:  has No Known Allergies.  MEDICATIONS:  Current Outpatient Medications  Medication Sig Dispense Refill   acetaminophen (TYLENOL)  650 MG CR tablet Take by mouth.     amLODipine-valsartan (EXFORGE) 10-320 MG tablet Take 1 tablet by mouth daily.     apixaban (ELIQUIS) 5 MG TABS tablet Take 1 tablet (5 mg total) by mouth 2 (two) times daily. 60 tablet 2   atorvastatin (LIPITOR) 10 MG tablet Take 1 tablet by mouth daily.     cabozantinib (CABOMETYX) 40 MG tablet Take 1 tablet (40 mg total) by mouth daily. Take on an empty stomach, 1 hour before or 2 hours after meals. 30 tablet 1   calcium carbonate (OS-CAL) 600 MG TABS tablet Take 2 tablets (1,200 mg total) by mouth 2 (two) times daily with a meal. 60 tablet 3   carvedilol (COREG) 25 MG tablet Take 25 mg by mouth 2 (two) times daily.     fenofibrate (TRICOR)  145 MG tablet Take 145 mg by mouth daily.     Insulin Lispro Prot & Lispro (HUMALOG 75/25 MIX) (75-25) 100 UNIT/ML Kwikpen Inject 30 Units into the skin every morning.     lamoTRIgine (LAMICTAL) 100 MG tablet Take 100 mg by mouth 2 (two) times daily.     loperamide (IMODIUM A-D) 2 MG tablet Take 1 tablet (2 mg total) by mouth See admin instructions. Take 4mg  with the onset of diarrhea, then take 2mg  after every loose bowel movements. Maximum 16mg  per 24 hours. 90 tablet 1   olmesartan (BENICAR) 20 MG tablet Take 20 mg by mouth daily. 1 Tablet(s) By Mouth Daily     omeprazole (PRILOSEC) 40 MG capsule Take 40 mg by mouth daily.     ondansetron (ZOFRAN) 8 MG tablet Take 1 tablet (8 mg total) by mouth every 8 (eight) hours as needed for nausea or vomiting. 20 tablet 0   tamsulosin (FLOMAX) 0.4 MG CAPS capsule Take 0.4 mg by mouth daily.      torsemide (DEMADEX) 20 MG tablet Take 20 mg by mouth daily.     ULTICARE PEN NEEDLES 29G X 12.7MM MISC      Valbenazine Tosylate (INGREZZA) 40 MG CAPS Take 40 mg by mouth daily.     ziprasidone (GEODON) 80 MG capsule Take 80 mg by mouth 2 (two) times daily.     DUREZOL 0.05 % EMUL Place 1 drop into the right eye 2 (two) times daily. (Patient not taking: Reported on 08/03/2021)     furosemide (LASIX) 20 MG tablet Take 0.5 tablets (10 mg total) by mouth daily. (Patient not taking: Reported on 08/03/2021) 30 tablet 0   NOVOLOG MIX 70/30 FLEXPEN (70-30) 100 UNIT/ML FlexPen Inject 30 Units as directed daily. (Patient not taking: Reported on 08/03/2021)     No current facility-administered medications for this visit.     PHYSICAL EXAMINATION: ECOG PERFORMANCE STATUS: 1 - Symptomatic but completely ambulatory Vitals:   08/03/21 1034  BP: (!) 158/93  Pulse: (!) 52  Resp: 18  Temp: (!) 97.4 F (36.3 C)   Filed Weights   08/03/21 1034  Weight: 224 lb 11.2 oz (101.9 kg)    Physical Exam Constitutional:      General: He is not in acute distress.    Comments:  He ambulates independently  HENT:     Head: Normocephalic and atraumatic.  Eyes:     General: No scleral icterus. Cardiovascular:     Rate and Rhythm: Normal rate and regular rhythm.     Heart sounds: Normal heart sounds.  Pulmonary:     Effort: Pulmonary effort is normal. No respiratory distress.  Breath sounds: No wheezing.  Abdominal:     General: Bowel sounds are normal. There is no distension.     Palpations: Abdomen is soft.  Musculoskeletal:        General: No deformity. Normal range of motion.     Cervical back: Normal range of motion and neck supple.  Skin:    General: Skin is warm and dry.     Findings: No erythema or rash.  Neurological:     Mental Status: He is alert and oriented to person, place, and time. Mental status is at baseline.     Comments: Speech is slow  Psychiatric:        Mood and Affect: Mood normal.    LABORATORY DATA:  I have reviewed the data as listed Lab Results  Component Value Date   WBC 4.7 08/03/2021   HGB 11.9 (L) 08/03/2021   HCT 34.3 (L) 08/03/2021   MCV 98.6 08/03/2021   PLT 253 08/03/2021   Recent Labs    05/26/21 1143 06/23/21 0938 08/03/21 1012  NA 142 141 143  K 3.8 4.0 3.9  CL 110 109 106  CO2 25 27 26   GLUCOSE 106* 57* 91  BUN 37* 26* 32*  CREATININE 3.30* 2.82* 3.12*  CALCIUM 8.1* 8.6* 8.3*  GFRNONAA 19* 23* 21*  PROT 6.7 7.1 6.6  ALBUMIN 3.5 3.7 3.6  AST 25 25 22   ALT 21 26 18   ALKPHOS 50 54 49  BILITOT 0.4 0.6 0.5    Iron/TIBC/Ferritin/ %Sat No results found for: IRON, TIBC, FERRITIN, IRONPCTSAT    RADIOGRAPHIC STUDIES: I have personally reviewed the radiological images as listed and agreed with the findings in the report. DG Chest 2 View  Result Date: 05/12/2021 CLINICAL DATA:  Post NM pulmonary profusion EXAM: CHEST - 2 VIEW COMPARISON:  CT 05/04/2021 FINDINGS: Subsegmental atelectasis/consolidation in the right middle lobe. Nodular opacity laterally in the lingula as seen on previous CT. No new  airspace opacity. No overt edema. Heart size and mediastinal contours are within normal limits. No effusion.  No pneumothorax. Visualized bones unremarkable. IMPRESSION: 1. Lingular nodular opacity and right middle lobe atelectasis/consolidation, without convincing change compared to prior CT. Electronically Signed   By: Lucrezia Europe M.D.   On: 05/12/2021 13:52   NM Pulmonary Perfusion  Result Date: 05/12/2021 CLINICAL DATA:  Shortness of breath. History of renal cell carcinoma. Recent noncontrast chest CT demonstrated lingular and right middle lobe opacity suspicious for pulmonary infarcts. Question pulmonary embolism. EXAM: NUCLEAR MEDICINE PERFUSION LUNG SCAN TECHNIQUE: Perfusion images were obtained in multiple projections after intravenous injection of radiopharmaceutical. Ventilation scans intentionally deferred if perfusion scan and chest x-ray adequate for interpretation during COVID 19 epidemic. RADIOPHARMACEUTICALS:  4.08 mCi Tc-43m MAA IV COMPARISON:  Chest CT 05/04/2021. FINDINGS: The pulmonary perfusion is diffusely heterogeneous with multiple small to moderate perfusion defects, most notably posteriorly in the right upper lobe and within the lingula. Without a ventilation scan, the etiology for these perfusion defects is indeterminate. IMPRESSION: There are multiple perfusion defects in both lungs which are indeterminate without a ventilatory examination. Findings could definitely reflect the sequela of recent pulmonary embolism based on recent noncontrast CT. If the patient is unable to undergo chest CTA with contrast, consider further evaluation with lower extremity Doppler ultrasound to assess for DVT. These results will be called to the ordering clinician or representative by the Radiologist Assistant, and communication documented in the PACS or [employer]. Electronically Signed   By: Caryl Comes.D.  On: 05/12/2021 13:05   US Venous Img Lower Bilateral  Result Date:  05/12/2021 CLINICAL DATA:  Evaluate for DVT EXAM: BILATERAL LOWER EXTREMITY VENOUS DOPPLER ULTRASOUND TECHNIQUE: Gray-scale sonography with graded compression, as well as color Doppler and duplex ultrasound were performed to evaluate the lower extremity deep venous systems from the level of the common femoral vein and including the common femoral, femoral, profunda femoral, popliteal and calf veins including the posterior tibial, peroneal and gastrocnemius veins when visible. The superficial great saphenous vein was also interrogated. Spectral Doppler was utilized to evaluate flow at rest and with distal augmentation maneuvers in the common femoral, femoral and popliteal veins. COMPARISON:  None. FINDINGS: RIGHT LOWER EXTREMITY Common Femoral Vein: No evidence of thrombus. Normal compressibility, respiratory phasicity and response to augmentation. Saphenofemoral Junction: No evidence of thrombus. Normal compressibility and flow on color Doppler imaging. Profunda Femoral Vein: No evidence of thrombus. Normal compressibility and flow on color Doppler imaging. Femoral Vein: No evidence of thrombus. Normal compressibility, respiratory phasicity and response to augmentation. Popliteal Vein: No evidence of thrombus. Normal compressibility, respiratory phasicity and response to augmentation. Calf Veins: No evidence of thrombus. Normal compressibility and flow on color Doppler imaging. Superficial Great Saphenous Vein: No evidence of thrombus. Normal compressibility. Venous Reflux:  None. Other Findings:  None. LEFT LOWER EXTREMITY Common Femoral Vein: No evidence of thrombus. Normal compressibility, respiratory phasicity and response to augmentation. Saphenofemoral Junction: No evidence of thrombus. Normal compressibility and flow on color Doppler imaging. Profunda Femoral Vein: No evidence of thrombus. Normal compressibility and flow on color Doppler imaging. Femoral Vein: No evidence of thrombus. Normal compressibility,  respiratory phasicity and response to augmentation. Popliteal Vein: No evidence of thrombus. Normal compressibility, respiratory phasicity and response to augmentation. Calf Veins: No evidence of thrombus. Normal compressibility and flow on color Doppler imaging. Somewhat limited visualization of the left peroneal veins due to surgical pin above left ankle. Superficial Great Saphenous Vein: No evidence of thrombus. Normal compressibility. Venous Reflux:  None. Other Findings:  None. IMPRESSION: No evidence of deep venous thrombosis in either lower extremity. Somewhat limited visualization of the left peroneal veins due to surgical pin above left ankle. Electronically Signed   By: Yetta Glassman M.D.   On: 05/12/2021 15:47        ASSESSMENT & PLAN:  1. Encounter for antineoplastic chemotherapy   2. Other acute pulmonary embolism without acute cor pulmonale ([name])   3. Cancer of kidney, left ([place])   4. Anemia due to stage 4 chronic kidney disease ([place])   5. Chemotherapy induced diarrhea    Cancer Staging  Cancer of kidney, left [facility]) Staging form: Kidney, AJCC 8th Edition - Clinical stage from 11/28/2020: Stage III (rcT1a, cN1, cM0) - Signed by Earlie Server, MD on 11/28/2020   Recurrent left papillary renal cell carcinoma, with nodal involvement- SBRT 11/2020 by Dr.Manning. Patient declines surgery.  Labs reviewed and discussed with patient.  Continue cabozantinib 40 mg  05/04/2021 CT showed treatment response.  I will repeat CT chest abdomen without contrast for evaluation of treatment response.  # acute pulmonary embolism Continue Eliquis 5 mg twice daily.  Patient has finished about 3 months therapeutic anticoagulation.  If CT showed no clot burden  given his history of bleeding, plan to decrease to 2.5 mg twice daily.  We will decide at the next visit   Hypertension, cabozantinib 40 mg daily follow-up with primary care provider.  Blood pressure has been stable.  Appreciate primary care provider  input.  Diarrhea, Recommend imodium PRN as instructed.  Symptoms has resolved.  CKD stage IV, Encourage oral hydration. SPEP negative, light chain ratio was slightly elevated, -expected in CKD. Creatinine is stable.  Avoid nephrotoxins.  Encourage oral hydration. Anemia, multifactorial, CKD, chemotherapy  Follow up in 6 weeks.   Orders Placed This Encounter  Procedures   CT CHEST ABDOMEN PELVIS WO CONTRAST    Standing Status:   Future    Standing Expiration Date:   08/03/2022    Order Specific Question:   If indicated for the ordered procedure, I authorize the administration of contrast media per Radiology protocol    Answer:   Yes    Order Specific Question:   Preferred imaging location?    Answer:   [facility]    Order Specific Question:   Is Oral Contrast requested for this exam?    Answer:   Yes, Per Radiology protocol   Comprehensive metabolic panel    Standing Status:   Future    Standing Expiration Date:   08/03/2022   CBC with Differential/Platelet    Standing Status:   Future    Standing Expiration Date:   08/03/2022    All questions were answered. The patient knows to call the clinic with any problems questions or concerns.  cc Jodi Marble, MD    Earlie Server, MD, PhD  08/03/2021

## 2021-08-09 DIAGNOSIS — I129 Hypertensive chronic kidney disease with stage 1 through stage 4 chronic kidney disease, or unspecified chronic kidney disease: Secondary | ICD-10-CM | POA: Diagnosis not present

## 2021-08-09 DIAGNOSIS — N2889 Other specified disorders of kidney and ureter: Secondary | ICD-10-CM | POA: Diagnosis not present

## 2021-08-09 DIAGNOSIS — E1122 Type 2 diabetes mellitus with diabetic chronic kidney disease: Secondary | ICD-10-CM | POA: Diagnosis not present

## 2021-08-09 DIAGNOSIS — N184 Chronic kidney disease, stage 4 (severe): Secondary | ICD-10-CM | POA: Diagnosis not present

## 2021-08-09 DIAGNOSIS — C649 Malignant neoplasm of unspecified kidney, except renal pelvis: Secondary | ICD-10-CM | POA: Diagnosis not present

## 2021-08-11 ENCOUNTER — Other Ambulatory Visit: Payer: Self-pay | Admitting: Oncology

## 2021-08-13 ENCOUNTER — Other Ambulatory Visit: Payer: Self-pay | Admitting: Oncology

## 2021-08-13 MED ORDER — CALCIUM CARBONATE 600 MG PO TABS: 1200.0000 mg | ORAL_TABLET | Freq: Two times a day (BID) | ORAL | 3 refills | Status: DC

## 2021-08-14 ENCOUNTER — Ambulatory Visit (INDEPENDENT_AMBULATORY_CARE_PROVIDER_SITE_OTHER): Payer: Medicare Other | Admitting: Podiatry

## 2021-08-14 ENCOUNTER — Encounter: Payer: Self-pay | Admitting: Podiatry

## 2021-08-14 ENCOUNTER — Other Ambulatory Visit: Payer: Self-pay | Admitting: Oncology

## 2021-08-14 ENCOUNTER — Other Ambulatory Visit: Payer: Self-pay

## 2021-08-14 DIAGNOSIS — M79674 Pain in right toe(s): Secondary | ICD-10-CM

## 2021-08-14 DIAGNOSIS — L84 Corns and callosities: Secondary | ICD-10-CM

## 2021-08-14 DIAGNOSIS — N2889 Other specified disorders of kidney and ureter: Secondary | ICD-10-CM | POA: Diagnosis not present

## 2021-08-14 DIAGNOSIS — M79675 Pain in left toe(s): Secondary | ICD-10-CM | POA: Diagnosis not present

## 2021-08-14 DIAGNOSIS — D631 Anemia in chronic kidney disease: Secondary | ICD-10-CM | POA: Diagnosis not present

## 2021-08-14 DIAGNOSIS — C649 Malignant neoplasm of unspecified kidney, except renal pelvis: Secondary | ICD-10-CM | POA: Diagnosis not present

## 2021-08-14 DIAGNOSIS — E1122 Type 2 diabetes mellitus with diabetic chronic kidney disease: Secondary | ICD-10-CM | POA: Diagnosis not present

## 2021-08-14 DIAGNOSIS — B351 Tinea unguium: Secondary | ICD-10-CM | POA: Diagnosis not present

## 2021-08-14 DIAGNOSIS — N183 Chronic kidney disease, stage 3 unspecified: Secondary | ICD-10-CM

## 2021-08-14 DIAGNOSIS — I129 Hypertensive chronic kidney disease with stage 1 through stage 4 chronic kidney disease, or unspecified chronic kidney disease: Secondary | ICD-10-CM | POA: Diagnosis not present

## 2021-08-14 DIAGNOSIS — E0843 Diabetes mellitus due to underlying condition with diabetic autonomic (poly)neuropathy: Secondary | ICD-10-CM

## 2021-08-14 DIAGNOSIS — N184 Chronic kidney disease, stage 4 (severe): Secondary | ICD-10-CM | POA: Diagnosis not present

## 2021-08-14 DIAGNOSIS — R809 Proteinuria, unspecified: Secondary | ICD-10-CM | POA: Diagnosis not present

## 2021-08-14 DIAGNOSIS — M216X2 Other acquired deformities of left foot: Secondary | ICD-10-CM

## 2021-08-14 NOTE — Progress Notes (Signed)
This patient returns to my office for at risk foot care.  This patient requires this care by a professional since this patient will be at risk due to having type 2 diabetes and stage 3 kidney disease  This patient is unable to cut nails himself since the patient cannot reach his nails.These nails are painful walking and wearing shoes. Painful callus outside ball of left foot.  This patient presents for at risk foot care today.  General Appearance  Alert, conversant and in no acute stress.  Vascular  Dorsalis pedis and posterior tibial  pulses are palpable  bilaterally.  Capillary return is within normal limits  bilaterally. Temperature is within normal limits  bilaterally.  Neurologic  Senn-Weinstein monofilament wire test absent bilaterally. Muscle power within normal limits bilaterally.  Nails Thick disfigured discolored nails with subungual debris  from second to fifth toes bilaterally. No evidence of bacterial infection or drainage bilaterally.  Orthopedic  No limitations of motion  feet .  No crepitus or effusions noted.  No bony pathology or digital deformities noted.  HAV  B/L.  Hammer toes  B/L.  Skin  normotropic skin with no porokeratosis noted bilaterally.  No signs of infections or ulcers noted.   Callus sub 5th met left foot.  Onychomycosis  Pain in right toes  Pain in left toes  Callus  B/L  Consent was obtained for treatment procedures.   Mechanical debridement of nails 1-5  bilaterally performed with a nail nipper.  Filed with dremel without incident.  Debride callus with # 15 blade followed by dremel tool.  Padding applied to insoles.   Return office visit   3 months                  Told patient to return for periodic foot care and evaluation due to potential at risk complications.   Gardiner Barefoot DPM

## 2021-08-24 ENCOUNTER — Ambulatory Visit: Payer: Medicare Other | Admitting: Podiatry

## 2021-08-25 ENCOUNTER — Other Ambulatory Visit (HOSPITAL_COMMUNITY): Payer: Self-pay

## 2021-08-25 ENCOUNTER — Other Ambulatory Visit: Payer: Self-pay | Admitting: Oncology

## 2021-08-25 DIAGNOSIS — C642 Malignant neoplasm of left kidney, except renal pelvis: Secondary | ICD-10-CM

## 2021-08-25 MED ORDER — CABOMETYX 40 MG PO TABS
40.0000 mg | ORAL_TABLET | Freq: Every day | ORAL | 1 refills | Status: DC
  Filled 2021-08-25: qty 30, 30d supply, fill #0
  Filled 2021-09-25: qty 30, 30d supply, fill #1

## 2021-08-29 ENCOUNTER — Other Ambulatory Visit: Payer: Self-pay | Admitting: Oncology

## 2021-08-30 ENCOUNTER — Ambulatory Visit: Payer: Medicare Other

## 2021-08-31 ENCOUNTER — Other Ambulatory Visit (HOSPITAL_COMMUNITY): Payer: Self-pay

## 2021-09-14 ENCOUNTER — Ambulatory Visit
Admission: RE | Admit: 2021-09-14 | Discharge: 2021-09-14 | Disposition: A | Discharge status: Home / Self Care | Payer: Medicare Other | Source: Ambulatory Visit | Attending: Oncology | Admitting: Oncology

## 2021-09-14 ENCOUNTER — Other Ambulatory Visit: Payer: Self-pay

## 2021-09-14 DIAGNOSIS — K8020 Calculus of gallbladder without cholecystitis without obstruction: Secondary | ICD-10-CM | POA: Diagnosis not present

## 2021-09-14 DIAGNOSIS — M5136 Other intervertebral disc degeneration, lumbar region: Secondary | ICD-10-CM | POA: Diagnosis not present

## 2021-09-14 DIAGNOSIS — K7689 Other specified diseases of liver: Secondary | ICD-10-CM | POA: Diagnosis not present

## 2021-09-14 DIAGNOSIS — I2510 Atherosclerotic heart disease of native coronary artery without angina pectoris: Secondary | ICD-10-CM | POA: Diagnosis not present

## 2021-09-14 DIAGNOSIS — Z85528 Personal history of other malignant neoplasm of kidney: Secondary | ICD-10-CM | POA: Diagnosis not present

## 2021-09-14 DIAGNOSIS — C642 Malignant neoplasm of left kidney, except renal pelvis: Secondary | ICD-10-CM | POA: Diagnosis not present

## 2021-09-14 DIAGNOSIS — N281 Cyst of kidney, acquired: Secondary | ICD-10-CM | POA: Diagnosis not present

## 2021-09-14 DIAGNOSIS — J439 Emphysema, unspecified: Secondary | ICD-10-CM | POA: Diagnosis not present

## 2021-09-15 ENCOUNTER — Encounter: Payer: Self-pay | Admitting: Oncology

## 2021-09-15 ENCOUNTER — Inpatient Hospital Stay: Payer: Medicare Other | Attending: Oncology

## 2021-09-15 ENCOUNTER — Inpatient Hospital Stay (HOSPITAL_BASED_OUTPATIENT_CLINIC_OR_DEPARTMENT_OTHER): Payer: Medicare Other | Admitting: Oncology

## 2021-09-15 VITALS — BP 150/81 | HR 55 | Temp 97.4°F | Wt 226.0 lb

## 2021-09-15 DIAGNOSIS — I10 Essential (primary) hypertension: Secondary | ICD-10-CM

## 2021-09-15 DIAGNOSIS — N184 Chronic kidney disease, stage 4 (severe): Secondary | ICD-10-CM | POA: Diagnosis not present

## 2021-09-15 DIAGNOSIS — D631 Anemia in chronic kidney disease: Secondary | ICD-10-CM | POA: Insufficient documentation

## 2021-09-15 DIAGNOSIS — I129 Hypertensive chronic kidney disease with stage 1 through stage 4 chronic kidney disease, or unspecified chronic kidney disease: Secondary | ICD-10-CM | POA: Diagnosis not present

## 2021-09-15 DIAGNOSIS — Z7901 Long term (current) use of anticoagulants: Secondary | ICD-10-CM | POA: Diagnosis not present

## 2021-09-15 DIAGNOSIS — Z5111 Encounter for antineoplastic chemotherapy: Secondary | ICD-10-CM

## 2021-09-15 DIAGNOSIS — C642 Malignant neoplasm of left kidney, except renal pelvis: Secondary | ICD-10-CM

## 2021-09-15 DIAGNOSIS — T451X5A Adverse effect of antineoplastic and immunosuppressive drugs, initial encounter: Secondary | ICD-10-CM | POA: Diagnosis not present

## 2021-09-15 DIAGNOSIS — I2699 Other pulmonary embolism without acute cor pulmonale: Secondary | ICD-10-CM | POA: Insufficient documentation

## 2021-09-15 DIAGNOSIS — K521 Toxic gastroenteritis and colitis: Secondary | ICD-10-CM | POA: Diagnosis not present

## 2021-09-15 LAB — CBC WITH DIFFERENTIAL/PLATELET
Abs Immature Granulocytes: 0.02 10*3/uL (ref 0.00–0.07)
Basophils Absolute: 0 10*3/uL (ref 0.0–0.1)
Basophils Relative: 1 %
Eosinophils Absolute: 0 10*3/uL (ref 0.0–0.5)
Eosinophils Relative: 0 %
HCT: 35.1 % — ABNORMAL LOW (ref 39.0–52.0)
Hemoglobin: 12.4 g/dL — ABNORMAL LOW (ref 13.0–17.0)
Immature Granulocytes: 1 %
Lymphocytes Relative: 32 %
Lymphs Abs: 1.2 10*3/uL (ref 0.7–4.0)
MCH: 34.8 pg — ABNORMAL HIGH (ref 26.0–34.0)
MCHC: 35.3 g/dL (ref 30.0–36.0)
MCV: 98.6 fL (ref 80.0–100.0)
Monocytes Absolute: 0.3 10*3/uL (ref 0.1–1.0)
Monocytes Relative: 7 %
Neutro Abs: 2.2 10*3/uL (ref 1.7–7.7)
Neutrophils Relative %: 59 %
Platelets: 254 10*3/uL (ref 150–400)
RBC: 3.56 MIL/uL — ABNORMAL LOW (ref 4.22–5.81)
RDW: 15.9 % — ABNORMAL HIGH (ref 11.5–15.5)
WBC: 3.6 10*3/uL — ABNORMAL LOW (ref 4.0–10.5)
nRBC: 0 % (ref 0.0–0.2)

## 2021-09-15 LAB — COMPREHENSIVE METABOLIC PANEL
ALT: 18 U/L (ref 0–44)
AST: 23 U/L (ref 15–41)
Albumin: 3.6 g/dL (ref 3.5–5.0)
Alkaline Phosphatase: 42 U/L (ref 38–126)
Anion gap: 7 (ref 5–15)
BUN: 30 mg/dL — ABNORMAL HIGH (ref 8–23)
CO2: 26 mmol/L (ref 22–32)
Calcium: 8 mg/dL — ABNORMAL LOW (ref 8.9–10.3)
Chloride: 108 mmol/L (ref 98–111)
Creatinine, Ser: 3.29 mg/dL — ABNORMAL HIGH (ref 0.61–1.24)
GFR, Estimated: 19 mL/min — ABNORMAL LOW (ref >60)
Glucose, Bld: 150 mg/dL — ABNORMAL HIGH (ref 70–99)
Potassium: 3.3 mmol/L — ABNORMAL LOW (ref 3.5–5.1)
Sodium: 141 mmol/L (ref 135–145)
Total Bilirubin: 0.3 mg/dL (ref 0.3–1.2)
Total Protein: 6.7 g/dL (ref 6.5–8.1)

## 2021-09-15 NOTE — Progress Notes (Signed)
Hematology/Oncology follow up note [phone] [phone]   Patient Care Team: Jodi Marble, MD as PCP - General (Internal Medicine) Marden Noble, MD (Internal Medicine) Tyler Pita, MD as Consulting Physician (Radiation Oncology)  REFERRING PROVIDER: Jodi Marble, MD  CHIEF COMPLAINTS/REASON FOR VISIT:  Follow-up for treatment of recurrent RCC  HISTORY OF PRESENTING ILLNESS:   Riley Stuart is a  71 y.o.  male with PMH listed below was seen in consultation at the request of  Jodi Marble, MD  for evaluation of [place] Patient has schizoaffective disorder and lives in the group home.  He is quite functional at baseline and makes his own medical decision. 11/27/2018 CT abdomen pelvis without contrast showed solid-appearing mass of the lower pole of the left kidney measuring up to 6.8 cm concerning for RCC. 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 08/10/2019, patient underwent partial left nephrectomy and results showed papillary renal cell carcinoma, type II, nuclear grade 3.  Size 9 cm.  Tumor invades perirenal fat (pT3a). Patient went image surveillance. 10/10/2020 , MRI abdomen with and without contrast showed new 2.4 x 1.8 cm faintly enhancing nodularity along the left kidney lower pole partial nephrectomy sites.  Suspicious for local recurrence.  New pathologically enlarged left periaortic lymph node, highly suspicious for malignancy in this context.  Numerous bilateral renal cysts.:  Diverticulosis lumbar spondylosis and degenerative disease 1.4 gallstone in the gallbladder.  Per note, urology Dr. Alinda Money has offered patient definitive surgical resection as well as regional lymph node dissection however patient declined the surgery due to his chronic kidney insufficiency and potential need of requiring dialysis after radical nephrectomy.  12/09/2020 status post SBRT to the periaortic lymph node radiation  oncology Dr. Tammi Klippel .  He was also seen by Dr. Pascal Lux for possible cryoablation.  Dr. Pascal Lux recommend repeat CT abdomen which has been ordered.  # 12/15/2020, CT abdomen without contrast showed nodular changes of the left retroperitoneum in the surgical bed suspicious for areas of disease recurrence in the left retroperitoneum and along the left flank.  Signs of increased soft tissue density about the inferior left renal sinus fat bowel following partial nephrectomy. Other scattered small nodules seen inferiorly along the plane of the sigmoid mesocolon not present on preoperative evaluations may serve as additional evidence of disease.    # Initially there was plan for cryoablation.  Patient was evaluated by IR Dr. Pascal Lux, however due to the disease progression on recent CT, Dr. Pascal Lux does not feel that patient will benefit from cryoablation at this point.   01/20/2021, started on cabozantinib 40 mg daily.  INTERVAL HISTORY Riley Stuart is a 71 y.o. male who has above history reviewed by me today presents for follow up visit for management of recurrent RCC Problems and complaints are listed below: He reports taking well. He is on Cabozantinib 40mg  daily.  Patient was accompanied by facility staff.  Patient reports feeling well.  No nausea vomiting diarrhea.  Denies any active bleeding episodes.   Review of Systems  Constitutional:  Negative for appetite change, chills, fatigue, fever and unexpected weight change.  HENT:   Negative for hearing loss and voice change.   Eyes:  Negative for eye problems and icterus.  Respiratory:  Negative for chest tightness, cough and shortness of breath.   Cardiovascular:  Negative for chest pain and leg swelling.  Gastrointestinal:  Negative for abdominal distention and abdominal pain.  Endocrine: Negative for hot flashes.  Genitourinary:  Negative for difficulty urinating, dysuria and frequency.   Musculoskeletal:  Negative for arthralgias.  Skin:   Negative for itching and rash.  Neurological:  Negative for light-headedness and numbness.  Hematological:  Negative for adenopathy. Does not bruise/bleed easily.  Psychiatric/Behavioral:  Negative for confusion.    MEDICAL HISTORY:  Past Medical History:  Diagnosis Date   Chronic kidney disease    renal insufficiency   Diabetes mellitus without complication ([place])    Pt takes Insulin   Hypertension    Mental disorder    schizoaffective   Renal cell carcinoma ([place])    Wears dentures    full upper and lower    SURGICAL HISTORY: Past Surgical History:  Procedure Laterality Date   ANKLE FRACTURE SURGERY     CATARACT EXTRACTION W/PHACO Left 03/08/2021   Procedure: CATARACT EXTRACTION PHACO AND INTRAOCULAR LENS PLACEMENT ([place]) LEFT DIABETIC 8.48 01:06.9;  Surgeon: Leandrew Koyanagi, MD;  Location: [facility];  Service: Ophthalmology;  Laterality: Left;  Diabetic - insulin   CATARACT EXTRACTION W/PHACO Right 03/22/2021   Procedure: CATARACT EXTRACTION PHACO AND INTRAOCULAR LENS PLACEMENT ([facility]) RIGHT DIABETIC 6.14 01:08.8;  Surgeon: Leandrew Koyanagi, MD;  Location: [facility];  Service: Ophthalmology;  Laterality: Right;  Diabetic - insulin   COLONOSCOPY WITH PROPOFOL N/A 04/15/2020   Procedure: COLONOSCOPY WITH PROPOFOL;  Surgeon: Virgel Manifold, MD;  Location: [facility];  Service: Endoscopy;  Laterality: N/A;   CRANIOTOMY Left 10/27/2013   Procedure: Craniotomy for Aneurysm Clipping;  Surgeon: Consuella Lose, MD;  Location: [facility];  Service: Neurosurgery;  Laterality: Left;   IR RADIOLOGIST EVAL & MGMT  11/22/2020   IR RADIOLOGIST EVAL & MGMT  12/21/2020   KNEE SURGERY     due to fracture.    ROBOTIC ASSITED PARTIAL NEPHRECTOMY Left 08/10/2019   Procedure: XI ROBOTIC ASSITED LAPAROSCOPIC  PARTIAL NEPHRECTOMY;  Surgeon: Raynelle Bring, MD;  Location: [facility];  Service: Urology;  Laterality: Left;    SOCIAL HISTORY: Social History    Socioeconomic History   Marital status: Single    Spouse name: Not on file   Number of children: 0   Years of education: Not on file   Highest education level: Not on file  Occupational History   Not on file  Tobacco Use   Smoking status: Every Day    Packs/day: 0.50    Years: 48.00    Pack years: 24.00    Types: Cigarettes   Smokeless tobacco: Never  Vaping Use   Vaping Use: Never used  Substance and Sexual Activity   Alcohol use: Not Currently   Drug use: Never   Sexual activity: Not Currently  Other Topics Concern   Not on file  Social History Narrative   Not on file   Social Determinants of Health   Financial Resource Strain: Not on file  Food Insecurity: Not on file  Transportation Needs: Not on file  Physical Activity: Not on file  Stress: Not on file  Social Connections: Not on file  Intimate Partner Violence: Not on file    FAMILY HISTORY: Family History  Problem Relation Age of Onset   Breast cancer Mother    Throat cancer Brother    Prostate cancer Neg Hx    Colon cancer Neg Hx    Pancreatic cancer Neg Hx     ALLERGIES:  has No Known Allergies.  MEDICATIONS:  Current Outpatient Medications  Medication Sig Dispense Refill   acetaminophen (TYLENOL) 650 MG CR tablet Take by  mouth.     amLODipine-valsartan (EXFORGE) 10-320 MG tablet Take 1 tablet by mouth daily.     atorvastatin (LIPITOR) 10 MG tablet Take 1 tablet by mouth daily.     cabozantinib (CABOMETYX) 40 MG tablet Take 1 tablet (40 mg total) by mouth daily. Take on an empty stomach, 1 hour before or 2 hours after meals. 30 tablet 1   calcium carbonate (OS-CAL) 600 MG TABS tablet Take 2 tablets (1,200 mg total) by mouth 2 (two) times daily with a meal. 120 tablet 3   carvedilol (COREG) 25 MG tablet Take 25 mg by mouth 2 (two) times daily.     ELIQUIS 5 MG TABS tablet TAKE 1 TABLET BY MOUTH TWICE A DAY 60 tablet 2   fenofibrate (TRICOR) 145 MG tablet Take 145 mg by mouth daily.      furosemide (LASIX) 20 MG tablet Take 0.5 tablets (10 mg total) by mouth daily. 30 tablet 0   Insulin Lispro Prot & Lispro (HUMALOG 75/25 MIX) (75-25) 100 UNIT/ML Kwikpen Inject 30 Units into the skin every morning.     lamoTRIgine (LAMICTAL) 100 MG tablet Take 100 mg by mouth 2 (two) times daily.     loperamide (IMODIUM A-D) 2 MG tablet Take 1 tablet (2 mg total) by mouth See admin instructions. Take 4mg  with the onset of diarrhea, then take 2mg  after every loose bowel movements. Maximum 16mg  per 24 hours. 90 tablet 1   olmesartan (BENICAR) 20 MG tablet Take 20 mg by mouth daily. 1 Tablet(s) By Mouth Daily     omeprazole (PRILOSEC) 40 MG capsule Take 40 mg by mouth daily.     ondansetron (ZOFRAN) 8 MG tablet Take 1 tablet (8 mg total) by mouth every 8 (eight) hours as needed for nausea or vomiting. 20 tablet 0   tamsulosin (FLOMAX) 0.4 MG CAPS capsule Take 0.4 mg by mouth daily.      torsemide (DEMADEX) 20 MG tablet Take 20 mg by mouth daily.     ULTICARE PEN NEEDLES 29G X 12.7MM MISC      Valbenazine Tosylate (INGREZZA) 40 MG CAPS Take 40 mg by mouth daily.     ziprasidone (GEODON) 80 MG capsule Take 80 mg by mouth 2 (two) times daily.     DUREZOL 0.05 % EMUL Place 1 drop into the right eye 2 (two) times daily. (Patient not taking: Reported on 08/03/2021)     NOVOLOG MIX 70/30 FLEXPEN (70-30) 100 UNIT/ML FlexPen Inject 30 Units as directed daily. (Patient not taking: Reported on 08/03/2021)     No current facility-administered medications for this visit.     PHYSICAL EXAMINATION: ECOG PERFORMANCE STATUS: 1 - Symptomatic but completely ambulatory Vitals:   09/15/21 1144  BP: (!) 150/81  Pulse: (!) 55  Temp: (!) 97.4 F (36.3 C)   Filed Weights   09/15/21 1144  Weight: 226 lb (102.5 kg)    Physical Exam Constitutional:      General: He is not in acute distress.    Comments: He ambulates independently  HENT:     Head: Normocephalic and atraumatic.  Eyes:     General: No scleral  icterus. Cardiovascular:     Rate and Rhythm: Normal rate and regular rhythm.     Heart sounds: Normal heart sounds.  Pulmonary:     Effort: Pulmonary effort is normal. No respiratory distress.     Breath sounds: No wheezing.  Abdominal:     General: Bowel sounds are normal. There is no distension.  Palpations: Abdomen is soft.  Musculoskeletal:        General: No deformity. Normal range of motion.     Cervical back: Normal range of motion and neck supple.  Skin:    General: Skin is warm and dry.     Findings: No erythema or rash.  Neurological:     Mental Status: He is alert and oriented to person, place, and time. Mental status is at baseline.     Comments: Speech is slow  Psychiatric:        Mood and Affect: Mood normal.    LABORATORY DATA:  I have reviewed the data as listed Lab Results  Component Value Date   WBC 3.6 (L) 09/15/2021   HGB 12.4 (L) 09/15/2021   HCT 35.1 (L) 09/15/2021   MCV 98.6 09/15/2021   PLT 254 09/15/2021   Recent Labs    06/23/21 0938 08/03/21 1012 09/15/21 1131  NA 141 143 141  K 4.0 3.9 3.3*  CL 109 106 108  CO2 27 26 26   GLUCOSE 57* 91 150*  BUN 26* 32* 30*  CREATININE 2.82* 3.12* 3.29*  CALCIUM 8.6* 8.3* 8.0*  GFRNONAA 23* 21* 19*  PROT 7.1 6.6 6.7  ALBUMIN 3.7 3.6 3.6  AST 25 22 23   ALT 26 18 18   ALKPHOS 54 49 42  BILITOT 0.6 0.5 0.3    Iron/TIBC/Ferritin/ %Sat No results found for: IRON, TIBC, FERRITIN, IRONPCTSAT    RADIOGRAPHIC STUDIES: I have personally reviewed the radiological images as listed and agreed with the findings in the report. CT CHEST ABDOMEN PELVIS WO CONTRAST  Result Date: 09/15/2021 CLINICAL DATA:  History of renal cell carcinoma status post partial left nephrectomy. EXAM: CT CHEST, ABDOMEN AND PELVIS WITHOUT CONTRAST TECHNIQUE: Multidetector CT imaging of the chest, abdomen and pelvis was performed following the standard protocol without IV contrast. RADIATION DOSE REDUCTION: This exam was performed  according to the departmental dose-optimization program which includes automated exposure control, adjustment of the mA and/or kV according to patient size and/or use of iterative reconstruction technique. COMPARISON:  05/04/2021 FINDINGS: CT CHEST FINDINGS Cardiovascular: The heart is normal in size. No pericardial effusion. The aorta is normal in caliber. Scattered atherosclerotic calcifications. Stable coronary artery calcifications. Mediastinum/Nodes: No mediastinal or hilar mass or adenopathy. The esophagus is grossly. Stable multinodular thyroid goiter. Largest nodule in the right lobe measures 19 mm. Recommend thyroid US (ref: J Am Coll Radiol. 2015 Feb;12(2): 143-50). Lungs/Pleura: Stable underlying emphysematous changes and areas of pulmonary scarring. Stable appearing rounded subpleural densities in the right middle lobe and in the left upper lobe. These are likely areas of rounded atelectasis or remote pulmonary infarcts. No new or progressive findings. No worrisome pulmonary nodules to suggest pulmonary metastatic disease. No pleural effusions pleural nodules. Patchy areas of tree-in-bud type findings are noted the right lower lobe likely reflecting an area of inflammation. Stable right basilar atelectasis or scarring. Musculoskeletal: No significant bony findings. CT ABDOMEN PELVIS FINDINGS Hepatobiliary: There are a few scattered small low-attenuation liver lesions. These appear to be stable simple cysts based on the prior MRI from 10/10/2020. No intrahepatic biliary dilatation. Stable cholelithiasis. No common bile duct dilatation. Pancreas: No mass, inflammation or ductal dilatation. Stable calcifications in the lower pancreatic head. Spleen: Normal size. No focal lesions. Adrenals/Urinary Tract: Adrenal glands are unremarkable and stable. Stable simple appearing bilateral renal cysts. 2.8 x 2.2 x 2.1 cm rounded higher attenuation lesion in the lower pole region of left kidney previously measured 2.4  x  19 x 17 mm. Stable very small high attenuation lesion in the midpole region of the right kidney posteriorly on image 74/2 most consistent with a hemorrhagic cyst. The bladder is unremarkable. Stomach/Bowel: The stomach, duodenum, small bowel and colon are grossly normal. Stable colonic diverticulosis. The terminal ileum and appendix are normal. Vascular/Lymphatic: Stable aortic calcifications but no aneurysm. The left para-aortic node on image number 74/2 measures 13.5 x 8 mm. It previously measured 15.5 x 12.5 mm. No new or progressive retroperitoneal adenopathy. Reproductive: The prostate gland and seminal vesicles are unremarkable. Other: No pelvic mass or adenopathy. No free pelvic fluid collections. No inguinal mass or adenopathy. No abdominal wall hernia or subcutaneous lesions. Musculoskeletal: No significant bony findings. Stable advanced degenerative disc disease the lower lumbar spine. No worrisome bone lesions. IMPRESSION: 1. Stable emphysematous changes and pulmonary scarring. 2. Stable rounded subpleural densities in the right middle lobe and left upper lobe, likely areas of rounded atelectasis or remote pulmonary infarcts. No new or progressive findings. 3. Slight interval enlargement the rounded hyperdense lesion in the lower pole region of the left kidney. 4. Slight interval decrease in size of the left para-aortic nodes. 5. Stable multinodular thyroid goiter. 19 mm right thyroid nodule. Recommend thyroid ultrasound. 6. Stable hepatic and renal cysts. 7. Cholelithiasis. Aortic Atherosclerosis (ICD10-I70.0) and Emphysema (ICD10-J43.9). Electronically Signed   By: Marijo Sanes M.D.   On: 09/15/2021 09:16        ASSESSMENT & PLAN:  1. Encounter for antineoplastic chemotherapy   2. Cancer of kidney, left ([place])   3. Anemia due to stage 4 chronic kidney disease ([place])   4. Chemotherapy induced diarrhea   5. Primary hypertension    Cancer Staging  Cancer of kidney, left [facility]) Staging form:  Kidney, AJCC 8th Edition - Clinical stage from 11/28/2020: Stage III (rcT1a, cN1, cM0) - Signed by Earlie Server, MD on 11/28/2020   Recurrent left papillary renal cell carcinoma, Patient declines surgery.  with nodal involvement- SBRT to nodal station in 11/2020 by Dr.Manning.  Labs reviewed and are discussed with patient.   09/14/21 CT chest abdomen pelvis without contrast showed mixed response.   Enlarging left kidney mass, 2.8 x 2.2 x 2.1cm. Slight interval decrease in size of left periaortic nodes. Stable rounded subpleural densities in the right middle lobe and left upper lobe. For now, continue cabozantinib 40 mg I am concerned about the progression of the kidney mass.  we will touch base with interventional radiology to see if cryoablation at this point is feasible. If not, will consider to switch to second lines of treatments.-    # acute pulmonary embolism Continue Eliquis 5 mg twice daily for now.  Hypertension, cabozantinib 40 mg daily follow-up with primary care provider.  Blood pressure has been stable.  Appreciate primary care provider input.  Diarrhea, Recommend imodium PRN as instructed.  Symptoms has resolved.  CKD stage IV, Encourage oral hydration. SPEP negative, light chain ratio was slightly elevated, -expected in CKD. Creatinine is stable.  Avoid nephrotoxins.  Encourage oral hydration. Anemia, multifactorial, CKD, chemotherapy  Follow up in 6 weeks.   Orders Placed This Encounter  Procedures   CBC with Differential/Platelet    Standing Status:   Future    Standing Expiration Date:   09/15/2022   Comprehensive metabolic panel    Standing Status:   Future    Standing Expiration Date:   09/15/2022    All questions were answered. The patient knows to call the clinic with any problems  questions or concerns.  cc Jodi Marble, MD    Earlie Server, MD, PhD  09/15/2021

## 2021-09-18 DIAGNOSIS — E119 Type 2 diabetes mellitus without complications: Secondary | ICD-10-CM | POA: Diagnosis not present

## 2021-09-18 DIAGNOSIS — I10 Essential (primary) hypertension: Secondary | ICD-10-CM | POA: Diagnosis not present

## 2021-09-20 DIAGNOSIS — C649 Malignant neoplasm of unspecified kidney, except renal pelvis: Secondary | ICD-10-CM | POA: Diagnosis not present

## 2021-09-20 DIAGNOSIS — N184 Chronic kidney disease, stage 4 (severe): Secondary | ICD-10-CM | POA: Diagnosis not present

## 2021-09-20 DIAGNOSIS — E119 Type 2 diabetes mellitus without complications: Secondary | ICD-10-CM | POA: Diagnosis not present

## 2021-09-20 DIAGNOSIS — I10 Essential (primary) hypertension: Secondary | ICD-10-CM | POA: Diagnosis not present

## 2021-09-25 ENCOUNTER — Other Ambulatory Visit (HOSPITAL_COMMUNITY): Payer: Self-pay

## 2021-09-27 ENCOUNTER — Other Ambulatory Visit (HOSPITAL_COMMUNITY): Payer: Self-pay

## 2021-10-02 ENCOUNTER — Other Ambulatory Visit (HOSPITAL_COMMUNITY): Payer: Self-pay

## 2021-10-06 DIAGNOSIS — E785 Hyperlipidemia, unspecified: Secondary | ICD-10-CM | POA: Diagnosis not present

## 2021-10-06 DIAGNOSIS — C649 Malignant neoplasm of unspecified kidney, except renal pelvis: Secondary | ICD-10-CM | POA: Diagnosis not present

## 2021-10-06 DIAGNOSIS — I10 Essential (primary) hypertension: Secondary | ICD-10-CM | POA: Diagnosis not present

## 2021-10-06 DIAGNOSIS — R040 Epistaxis: Secondary | ICD-10-CM | POA: Diagnosis not present

## 2021-10-06 DIAGNOSIS — E119 Type 2 diabetes mellitus without complications: Secondary | ICD-10-CM | POA: Diagnosis not present

## 2021-10-06 DIAGNOSIS — N184 Chronic kidney disease, stage 4 (severe): Secondary | ICD-10-CM | POA: Diagnosis not present

## 2021-10-20 ENCOUNTER — Other Ambulatory Visit (HOSPITAL_COMMUNITY): Payer: Self-pay

## 2021-10-20 ENCOUNTER — Other Ambulatory Visit: Payer: Self-pay | Admitting: Oncology

## 2021-10-20 DIAGNOSIS — C642 Malignant neoplasm of left kidney, except renal pelvis: Secondary | ICD-10-CM

## 2021-10-20 NOTE — Telephone Encounter (Signed)
Notes           Component Ref Range & Units 1 mo ago 2 mo ago 3 mo ago 4 mo ago 5 mo ago 6 mo ago 7 mo ago  WBC 4.0 - 10.5 K/uL 3.6 Low   4.7  4.3  4.5  4.1  5.8  4.9   RBC 4.22 - 5.81 MIL/uL 3.56 Low   3.48 Low   3.62 Low   3.37 Low   3.62 Low   3.78 Low   3.85 Low    Hemoglobin 13.0 - 17.0 g/dL 12.4 Low   11.9 Low   12.3 Low   11.3 Low   11.9 Low   12.2 Low   12.2 Low    HCT 39.0 - 52.0 % 35.1 Low   34.3 Low   35.8 Low   32.4 Low   34.2 Low   35.5 Low   35.3 Low    MCV 80.0 - 100.0 fL 98.6  98.6  98.9  96.1  94.5  93.9  91.7   MCH 26.0 - 34.0 pg 34.8 High   34.2 High   34.0  33.5  32.9  32.3  31.7   MCHC 30.0 - 36.0 g/dL 35.3  34.7  34.4  34.9  34.8  34.4  34.6   RDW 11.5 - 15.5 % 15.9 High   17.4 High   18.6 High   18.7 High   18.3 High   18.2 High   17.7 High    Platelets 150 - 400 K/uL 254  253  286  272  219  266  243   nRBC 0.0 - 0.2 % 0.0  0.0  0.0  0.0  0.0  0.0  0.0   Neutrophils Relative % % 59  67  63  69  64  70  69   Neutro Abs 1.7 - 7.7 K/uL 2.2  3.1  2.8  3.1  2.6  4.1  3.4   Lymphocytes Relative % 32  25  27  23  28  21  24    Lymphs Abs 0.7 - 4.0 K/uL 1.2  1.2  1.2  1.1  1.2  1.2  1.2   Monocytes Relative % 7  7  8  8  7  8  7    Monocytes Absolute 0.1 - 1.0 K/uL 0.3  0.3  0.4  0.4  0.3  0.4  0.4   Eosinophils Relative % 0  0  0  0  0  0  0   Eosinophils Absolute 0.0 - 0.5 K/uL 0.0  0.0  0.0  0.0  0.0  0.0  0.0   Basophils Relative % 1  1  1   0  1  1  0   Basophils Absolute 0.0 - 0.1 K/uL 0.0  0.0  0.0  0.0  0.0  0.0  0.0   Immature Granulocytes % 1  0  1  0  0  0  0   Abs Immature Granulocytes 0.00 - 0.07 K/uL 0.02  0.02 CM  0.02 CM  0.02 CM  0.01 CM  0.01 CM  0.02 CM   Comment: Performed at [facility], [address] [place], [place] [address]  Resulting Agency  [facility] [place] [facility] [place] [facility] [place] [facility] [place] [facility] [place] [facility] [place] [facility]         Specimen Collected: 09/15/21 11:31 Last Resulted: 09/15/21 11:40  Lab Theme park manager and Collection Details    Routing    Result History    View Encounter Conversation      CM=Additional comments      Result Care Coordination   Patient Communication   Add Comments   Not seen Back to Top       Other Results from 09/15/2021   Contains abnormal data Comprehensive metabolic panel Order: [id number] Status: Final result    Visible to patient: No (inaccessible in MyChart)    Next appt: 10/27/2021 at 11:45 AM in Oncology ([facility])    Dx: Cancer of kidney, left ([place])    0 Result Notes           Component Ref Range & Units 1 mo ago 2 mo ago 3 mo ago 4 mo ago 5 mo ago 6 mo ago 7 mo ago  Sodium 135 - 145 mmol/L 141  143  141  142  142  144  142   Potassium 3.5 - 5.1 mmol/L 3.3 Low   3.9  4.0  3.8  4.2  3.8  3.9   Chloride 98 - 111 mmol/L 108  106  109  110  110  109  107   CO2 22 - 32 mmol/L 26  26  27  25  22  26  27    Glucose, Bld 70 - 99 mg/dL 150 High   91 CM  57 Low  CM  106 High  CM  86 CM  82 CM  80 CM   Comment: Glucose reference range applies only to samples taken after fasting for at least 8 hours.  BUN 8 - 23 mg/dL 30 High   32 High   26 High   37 High   33 High   36 High   46 High    Creatinine, Ser 0.61 - 1.24 mg/dL 3.29 High   3.12 High   2.82 High   3.30 High   3.72 High   3.61 High   3.77 High    Calcium 8.9 - 10.3 mg/dL 8.0 Low   8.3 Low   8.6 Low   8.1 Low   8.4 Low   8.8 Low   8.9   Total Protein 6.5 - 8.1 g/dL 6.7  6.6  7.1  6.7  6.9  7.5    Albumin 3.5 - 5.0 g/dL 3.6  3.6  3.7  3.5  3.5  3.6    AST 15 - 41 U/L 23  22  25  25  24  22     ALT 0 - 44 U/L 18  18  26  21  21  21     Alkaline Phosphatase 38 - 126 U/L 42  49  54  50  46  44    Total Bilirubin 0.3 - 1.2 mg/dL 0.3  0.5  0.6  0.4  0.5  0.7    GFR, Estimated >60 mL/min 19 Low   21 Low  CM  23 Low  CM  19 Low  CM  17 Low  CM  17 Low  CM  16 Low  CM   Comment: (NOTE)  Calculated using the CKD-EPI Creatinine Equation (2021)   Anion gap 5 - 15 7  11  CM  5 CM  7 CM  10 CM   9 CM  8 CM   Comment: Performed at [facility], [address] [place], [place] [address]  Resulting Agency  [place] [facility] [place] [facility] [place] [facility] [place] [facility] [place] [facility] [place] [facility] [place] [facility]         Specimen Collected: 09/15/21 11:31 Last Resulted: 09/15/21 11:53

## 2021-10-23 ENCOUNTER — Other Ambulatory Visit (HOSPITAL_COMMUNITY): Payer: Self-pay

## 2021-10-24 ENCOUNTER — Other Ambulatory Visit (HOSPITAL_COMMUNITY): Payer: Self-pay

## 2021-10-24 MED ORDER — CABOMETYX 40 MG PO TABS
40.0000 mg | ORAL_TABLET | Freq: Every day | ORAL | 1 refills | Status: DC
  Filled 2021-10-24: qty 30, 30d supply, fill #0
  Filled 2021-11-28: qty 30, 30d supply, fill #1

## 2021-10-25 DIAGNOSIS — E119 Type 2 diabetes mellitus without complications: Secondary | ICD-10-CM | POA: Diagnosis not present

## 2021-10-25 DIAGNOSIS — E785 Hyperlipidemia, unspecified: Secondary | ICD-10-CM | POA: Diagnosis not present

## 2021-10-25 DIAGNOSIS — I10 Essential (primary) hypertension: Secondary | ICD-10-CM | POA: Diagnosis not present

## 2021-10-25 DIAGNOSIS — N184 Chronic kidney disease, stage 4 (severe): Secondary | ICD-10-CM | POA: Diagnosis not present

## 2021-10-25 DIAGNOSIS — C649 Malignant neoplasm of unspecified kidney, except renal pelvis: Secondary | ICD-10-CM | POA: Diagnosis not present

## 2021-10-26 ENCOUNTER — Other Ambulatory Visit (HOSPITAL_COMMUNITY): Payer: Self-pay

## 2021-10-27 ENCOUNTER — Other Ambulatory Visit: Payer: Self-pay

## 2021-10-27 ENCOUNTER — Inpatient Hospital Stay: Payer: Medicare Other | Attending: Oncology

## 2021-10-27 ENCOUNTER — Inpatient Hospital Stay (HOSPITAL_BASED_OUTPATIENT_CLINIC_OR_DEPARTMENT_OTHER): Payer: Medicare Other | Admitting: Oncology

## 2021-10-27 ENCOUNTER — Encounter: Payer: Self-pay | Admitting: Oncology

## 2021-10-27 VITALS — BP 165/90 | HR 54 | Temp 96.5°F | Wt 227.0 lb

## 2021-10-27 DIAGNOSIS — N184 Chronic kidney disease, stage 4 (severe): Secondary | ICD-10-CM | POA: Insufficient documentation

## 2021-10-27 DIAGNOSIS — Z79899 Other long term (current) drug therapy: Secondary | ICD-10-CM | POA: Diagnosis not present

## 2021-10-27 DIAGNOSIS — D631 Anemia in chronic kidney disease: Secondary | ICD-10-CM

## 2021-10-27 DIAGNOSIS — Z905 Acquired absence of kidney: Secondary | ICD-10-CM | POA: Insufficient documentation

## 2021-10-27 DIAGNOSIS — Z86711 Personal history of pulmonary embolism: Secondary | ICD-10-CM | POA: Insufficient documentation

## 2021-10-27 DIAGNOSIS — C642 Malignant neoplasm of left kidney, except renal pelvis: Secondary | ICD-10-CM

## 2021-10-27 DIAGNOSIS — I10 Essential (primary) hypertension: Secondary | ICD-10-CM | POA: Diagnosis not present

## 2021-10-27 DIAGNOSIS — Z5111 Encounter for antineoplastic chemotherapy: Secondary | ICD-10-CM

## 2021-10-27 DIAGNOSIS — I129 Hypertensive chronic kidney disease with stage 1 through stage 4 chronic kidney disease, or unspecified chronic kidney disease: Secondary | ICD-10-CM | POA: Diagnosis not present

## 2021-10-27 DIAGNOSIS — I2699 Other pulmonary embolism without acute cor pulmonale: Secondary | ICD-10-CM | POA: Diagnosis not present

## 2021-10-27 DIAGNOSIS — K521 Toxic gastroenteritis and colitis: Secondary | ICD-10-CM | POA: Insufficient documentation

## 2021-10-27 DIAGNOSIS — T451X5A Adverse effect of antineoplastic and immunosuppressive drugs, initial encounter: Secondary | ICD-10-CM

## 2021-10-27 DIAGNOSIS — F17210 Nicotine dependence, cigarettes, uncomplicated: Secondary | ICD-10-CM | POA: Insufficient documentation

## 2021-10-27 DIAGNOSIS — Z7901 Long term (current) use of anticoagulants: Secondary | ICD-10-CM | POA: Insufficient documentation

## 2021-10-27 DIAGNOSIS — E113291 Type 2 diabetes mellitus with mild nonproliferative diabetic retinopathy without macular edema, right eye: Secondary | ICD-10-CM | POA: Diagnosis not present

## 2021-10-27 LAB — CBC WITH DIFFERENTIAL/PLATELET
Abs Immature Granulocytes: 0.04 10*3/uL (ref 0.00–0.07)
Basophils Absolute: 0 10*3/uL (ref 0.0–0.1)
Basophils Relative: 1 %
Eosinophils Absolute: 0 10*3/uL (ref 0.0–0.5)
Eosinophils Relative: 0 %
HCT: 31.7 % — ABNORMAL LOW (ref 39.0–52.0)
Hemoglobin: 11.1 g/dL — ABNORMAL LOW (ref 13.0–17.0)
Immature Granulocytes: 1 %
Lymphocytes Relative: 22 %
Lymphs Abs: 1.1 10*3/uL (ref 0.7–4.0)
MCH: 35.2 pg — ABNORMAL HIGH (ref 26.0–34.0)
MCHC: 35 g/dL (ref 30.0–36.0)
MCV: 100.6 fL — ABNORMAL HIGH (ref 80.0–100.0)
Monocytes Absolute: 0.4 10*3/uL (ref 0.1–1.0)
Monocytes Relative: 8 %
Neutro Abs: 3.5 10*3/uL (ref 1.7–7.7)
Neutrophils Relative %: 68 %
Platelets: 262 10*3/uL (ref 150–400)
RBC: 3.15 MIL/uL — ABNORMAL LOW (ref 4.22–5.81)
RDW: 16.7 % — ABNORMAL HIGH (ref 11.5–15.5)
WBC: 5.1 10*3/uL (ref 4.0–10.5)
nRBC: 0 % (ref 0.0–0.2)

## 2021-10-27 LAB — COMPREHENSIVE METABOLIC PANEL
ALT: 19 U/L (ref 0–44)
AST: 26 U/L (ref 15–41)
Albumin: 3.4 g/dL — ABNORMAL LOW (ref 3.5–5.0)
Alkaline Phosphatase: 33 U/L — ABNORMAL LOW (ref 38–126)
Anion gap: 9 (ref 5–15)
BUN: 28 mg/dL — ABNORMAL HIGH (ref 8–23)
CO2: 24 mmol/L (ref 22–32)
Calcium: 7.4 mg/dL — ABNORMAL LOW (ref 8.9–10.3)
Chloride: 105 mmol/L (ref 98–111)
Creatinine, Ser: 3.12 mg/dL — ABNORMAL HIGH (ref 0.61–1.24)
GFR, Estimated: 21 mL/min — ABNORMAL LOW (ref >60)
Glucose, Bld: 110 mg/dL — ABNORMAL HIGH (ref 70–99)
Potassium: 3.3 mmol/L — ABNORMAL LOW (ref 3.5–5.1)
Sodium: 138 mmol/L (ref 135–145)
Total Bilirubin: 0.2 mg/dL — ABNORMAL LOW (ref 0.3–1.2)
Total Protein: 6.5 g/dL (ref 6.5–8.1)

## 2021-10-27 MED ORDER — APIXABAN 2.5 MG PO TABS: 2.5000 mg | ORAL_TABLET | Freq: Two times a day (BID) | ORAL | 3 refills | Status: DC

## 2021-10-27 NOTE — Progress Notes (Signed)
Hematology/Oncology follow up note [phone] [phone]   Patient Care Team: Jodi Marble, MD as PCP - General (Internal Medicine) Marden Noble, MD (Internal Medicine) Tyler Pita, MD as Consulting Physician (Radiation Oncology)  REFERRING PROVIDER: Jodi Marble, MD  CHIEF COMPLAINTS/REASON FOR VISIT:  Follow-up for treatment of recurrent RCC  HISTORY OF PRESENTING ILLNESS:   Riley Stuart is a  71 y.o.  male with PMH listed below was seen in consultation at the request of  Jodi Marble, MD  for evaluation of [place] Patient has schizoaffective disorder and lives in the group home.  He is quite functional at baseline and makes his own medical decision. 11/27/2018 CT abdomen pelvis without contrast showed solid-appearing mass of the lower pole of the left kidney measuring up to 6.8 cm concerning for RCC. 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 08/10/2019, patient underwent partial left nephrectomy and results showed papillary renal cell carcinoma, type II, nuclear grade 3.  Size 9 cm.  Tumor invades perirenal fat (pT3a). Patient went image surveillance. 10/10/2020 , MRI abdomen with and without contrast showed new 2.4 x 1.8 cm faintly enhancing nodularity along the left kidney lower pole partial nephrectomy sites.  Suspicious for local recurrence.  New pathologically enlarged left periaortic lymph node, highly suspicious for malignancy in this context.  Numerous bilateral renal cysts.:  Diverticulosis lumbar spondylosis and degenerative disease 1.4 gallstone in the gallbladder.  Per note, urology Dr. Alinda Money has offered patient definitive surgical resection as well as regional lymph node dissection however patient declined the surgery due to his chronic kidney insufficiency and potential need of requiring dialysis after radical nephrectomy.  12/09/2020 status post SBRT to the periaortic lymph node radiation  oncology Dr. Tammi Klippel .  He was also seen by Dr. Pascal Lux for possible cryoablation.  Dr. Pascal Lux recommend repeat CT abdomen which has been ordered.  # 12/15/2020, CT abdomen without contrast showed nodular changes of the left retroperitoneum in the surgical bed suspicious for areas of disease recurrence in the left retroperitoneum and along the left flank.  Signs of increased soft tissue density about the inferior left renal sinus fat bowel following partial nephrectomy. Other scattered small nodules seen inferiorly along the plane of the sigmoid mesocolon not present on preoperative evaluations may serve as additional evidence of disease.    # Initially there was plan for cryoablation.  Patient was evaluated by IR Dr. Pascal Lux, however due to the disease progression on recent CT, Dr. Pascal Lux does not feel that patient will benefit from cryoablation at this point.   01/20/2021, started on cabozantinib 40 mg daily.  INTERVAL HISTORY Riley Stuart is a 71 y.o. male who has above history reviewed by me today presents for follow up visit for management of recurrent RCC Reports doing well. No new complaitns.  He is on Cabozantinib 40mg  daily.  Patient was accompanied by facility staff.    No nausea vomiting diarrhea.  Denies any active bleeding episodes.   Review of Systems  Constitutional:  Negative for appetite change, chills, fatigue, fever and unexpected weight change.  HENT:   Negative for hearing loss and voice change.   Eyes:  Negative for eye problems and icterus.  Respiratory:  Negative for chest tightness, cough and shortness of breath.   Cardiovascular:  Negative for chest pain and leg swelling.  Gastrointestinal:  Negative for abdominal distention and abdominal pain.  Endocrine: Negative for hot flashes.  Genitourinary:  Negative for difficulty urinating, dysuria  and frequency.   Musculoskeletal:  Negative for arthralgias.  Skin:  Negative for itching and rash.  Neurological:  Negative  for light-headedness and numbness.  Hematological:  Negative for adenopathy. Does not bruise/bleed easily.  Psychiatric/Behavioral:  Negative for confusion.    MEDICAL HISTORY:  Past Medical History:  Diagnosis Date   Chronic kidney disease    renal insufficiency   Diabetes mellitus without complication ([place])    Pt takes Insulin   Hypertension    Mental disorder    schizoaffective   Renal cell carcinoma ([place])    Wears dentures    full upper and lower    SURGICAL HISTORY: Past Surgical History:  Procedure Laterality Date   ANKLE FRACTURE SURGERY     CATARACT EXTRACTION W/PHACO Left 03/08/2021   Procedure: CATARACT EXTRACTION PHACO AND INTRAOCULAR LENS PLACEMENT ([place]) LEFT DIABETIC 8.48 01:06.9;  Surgeon: Leandrew Koyanagi, MD;  Location: [facility];  Service: Ophthalmology;  Laterality: Left;  Diabetic - insulin   CATARACT EXTRACTION W/PHACO Right 03/22/2021   Procedure: CATARACT EXTRACTION PHACO AND INTRAOCULAR LENS PLACEMENT ([facility]) RIGHT DIABETIC 6.14 01:08.8;  Surgeon: Leandrew Koyanagi, MD;  Location: [facility];  Service: Ophthalmology;  Laterality: Right;  Diabetic - insulin   COLONOSCOPY WITH PROPOFOL N/A 04/15/2020   Procedure: COLONOSCOPY WITH PROPOFOL;  Surgeon: Virgel Manifold, MD;  Location: [facility];  Service: Endoscopy;  Laterality: N/A;   CRANIOTOMY Left 10/27/2013   Procedure: Craniotomy for Aneurysm Clipping;  Surgeon: Consuella Lose, MD;  Location: [facility];  Service: Neurosurgery;  Laterality: Left;   IR RADIOLOGIST EVAL & MGMT  11/22/2020   IR RADIOLOGIST EVAL & MGMT  12/21/2020   KNEE SURGERY     due to fracture.    ROBOTIC ASSITED PARTIAL NEPHRECTOMY Left 08/10/2019   Procedure: XI ROBOTIC ASSITED LAPAROSCOPIC  PARTIAL NEPHRECTOMY;  Surgeon: Raynelle Bring, MD;  Location: [facility];  Service: Urology;  Laterality: Left;    SOCIAL HISTORY: Social History   Socioeconomic History   Marital status: Single    Spouse name: Not  on file   Number of children: 0   Years of education: Not on file   Highest education level: Not on file  Occupational History   Not on file  Tobacco Use   Smoking status: Every Day    Packs/day: 0.50    Years: 48.00    Pack years: 24.00    Types: Cigarettes   Smokeless tobacco: Never  Vaping Use   Vaping Use: Never used  Substance and Sexual Activity   Alcohol use: Not Currently   Drug use: Never   Sexual activity: Not Currently  Other Topics Concern   Not on file  Social History Narrative   Not on file   Social Determinants of Health   Financial Resource Strain: Not on file  Food Insecurity: Not on file  Transportation Needs: Not on file  Physical Activity: Not on file  Stress: Not on file  Social Connections: Not on file  Intimate Partner Violence: Not on file    FAMILY HISTORY: Family History  Problem Relation Age of Onset   Breast cancer Mother    Throat cancer Brother    Prostate cancer Neg Hx    Colon cancer Neg Hx    Pancreatic cancer Neg Hx     ALLERGIES:  has No Known Allergies.  MEDICATIONS:  Current Outpatient Medications  Medication Sig Dispense Refill   acetaminophen (TYLENOL) 650 MG CR tablet Take by mouth.  amLODipine-valsartan (EXFORGE) 10-320 MG tablet Take 1 tablet by mouth daily.     apixaban (ELIQUIS) 2.5 MG TABS tablet Take 1 tablet (2.5 mg total) by mouth 2 (two) times daily. 60 tablet 3   atorvastatin (LIPITOR) 10 MG tablet Take 1 tablet by mouth daily.     cabozantinib (CABOMETYX) 40 MG tablet Take 1 tablet (40 mg total) by mouth daily. Take on an empty stomach, 1 hour before or 2 hours after meals. 30 tablet 1   calcium carbonate (OS-CAL) 600 MG TABS tablet Take 2 tablets (1,200 mg total) by mouth 2 (two) times daily with a meal. 120 tablet 3   carvedilol (COREG) 25 MG tablet Take 25 mg by mouth 2 (two) times daily.     fenofibrate (TRICOR) 145 MG tablet Take 145 mg by mouth daily.     furosemide (LASIX) 20 MG tablet Take 0.5  tablets (10 mg total) by mouth daily. 30 tablet 0   Insulin Lispro Prot & Lispro (HUMALOG 75/25 MIX) (75-25) 100 UNIT/ML Kwikpen Inject 30 Units into the skin every morning.     lamoTRIgine (LAMICTAL) 100 MG tablet Take 100 mg by mouth 2 (two) times daily.     loperamide (IMODIUM A-D) 2 MG tablet Take 1 tablet (2 mg total) by mouth See admin instructions. Take 4mg  with the onset of diarrhea, then take 2mg  after every loose bowel movements. Maximum 16mg  per 24 hours. 90 tablet 1   olmesartan (BENICAR) 20 MG tablet Take 20 mg by mouth daily. 1 Tablet(s) By Mouth Daily     omeprazole (PRILOSEC) 40 MG capsule Take 40 mg by mouth daily.     ondansetron (ZOFRAN) 8 MG tablet Take 1 tablet (8 mg total) by mouth every 8 (eight) hours as needed for nausea or vomiting. 20 tablet 0   tamsulosin (FLOMAX) 0.4 MG CAPS capsule Take 0.4 mg by mouth daily.      torsemide (DEMADEX) 20 MG tablet Take 20 mg by mouth daily.     ULTICARE PEN NEEDLES 29G X 12.7MM MISC      Valbenazine Tosylate (INGREZZA) 40 MG CAPS Take 40 mg by mouth daily.     ziprasidone (GEODON) 80 MG capsule Take 80 mg by mouth 2 (two) times daily.     DUREZOL 0.05 % EMUL Place 1 drop into the right eye 2 (two) times daily. (Patient not taking: Reported on 08/03/2021)     NOVOLOG MIX 70/30 FLEXPEN (70-30) 100 UNIT/ML FlexPen Inject 30 Units as directed daily. (Patient not taking: Reported on 08/03/2021)     No current facility-administered medications for this visit.     PHYSICAL EXAMINATION: ECOG PERFORMANCE STATUS: 1 - Symptomatic but completely ambulatory Vitals:   10/27/21 1202  BP: (!) 165/90  Pulse: (!) 54  Temp: (!) 96.5 F (35.8 C)   Filed Weights   10/27/21 1202  Weight: 227 lb (103 kg)    Physical Exam Constitutional:      General: He is not in acute distress.    Comments: He ambulates independently  HENT:     Head: Normocephalic and atraumatic.  Eyes:     General: No scleral icterus. Cardiovascular:     Rate and  Rhythm: Normal rate and regular rhythm.     Heart sounds: Normal heart sounds.  Pulmonary:     Effort: Pulmonary effort is normal. No respiratory distress.     Breath sounds: No wheezing.  Abdominal:     General: Bowel sounds are normal. There is no distension.  Palpations: Abdomen is soft.  Musculoskeletal:        General: No deformity. Normal range of motion.     Cervical back: Normal range of motion and neck supple.  Skin:    General: Skin is warm and dry.     Findings: No erythema or rash.  Neurological:     Mental Status: He is alert and oriented to person, place, and time. Mental status is at baseline.     Comments: Speech is slow  Psychiatric:        Mood and Affect: Mood normal.    LABORATORY DATA:  I have reviewed the data as listed Lab Results  Component Value Date   WBC 5.1 10/27/2021   HGB 11.1 (L) 10/27/2021   HCT 31.7 (L) 10/27/2021   MCV 100.6 (H) 10/27/2021   PLT 262 10/27/2021   Recent Labs    08/03/21 1012 09/15/21 1131 10/27/21 1151  NA 143 141 138  K 3.9 3.3* 3.3*  CL 106 108 105  CO2 26 26 24   GLUCOSE 91 150* 110*  BUN 32* 30* 28*  CREATININE 3.12* 3.29* 3.12*  CALCIUM 8.3* 8.0* 7.4*  GFRNONAA 21* 19* 21*  PROT 6.6 6.7 6.5  ALBUMIN 3.6 3.6 3.4*  AST 22 23 26   ALT 18 18 19   ALKPHOS 49 42 33*  BILITOT 0.5 0.3 0.2*    Iron/TIBC/Ferritin/ %Sat No results found for: IRON, TIBC, FERRITIN, IRONPCTSAT    RADIOGRAPHIC STUDIES: I have personally reviewed the radiological images as listed and agreed with the findings in the report. CT CHEST ABDOMEN PELVIS WO CONTRAST  Result Date: 09/15/2021 CLINICAL DATA:  History of renal cell carcinoma status post partial left nephrectomy. EXAM: CT CHEST, ABDOMEN AND PELVIS WITHOUT CONTRAST TECHNIQUE: Multidetector CT imaging of the chest, abdomen and pelvis was performed following the standard protocol without IV contrast. RADIATION DOSE REDUCTION: This exam was performed according to the departmental  dose-optimization program which includes automated exposure control, adjustment of the mA and/or kV according to patient size and/or use of iterative reconstruction technique. COMPARISON:  05/04/2021 FINDINGS: CT CHEST FINDINGS Cardiovascular: The heart is normal in size. No pericardial effusion. The aorta is normal in caliber. Scattered atherosclerotic calcifications. Stable coronary artery calcifications. Mediastinum/Nodes: No mediastinal or hilar mass or adenopathy. The esophagus is grossly. Stable multinodular thyroid goiter. Largest nodule in the right lobe measures 19 mm. Recommend thyroid US (ref: J Am Coll Radiol. 2015 Feb;12(2): 143-50). Lungs/Pleura: Stable underlying emphysematous changes and areas of pulmonary scarring. Stable appearing rounded subpleural densities in the right middle lobe and in the left upper lobe. These are likely areas of rounded atelectasis or remote pulmonary infarcts. No new or progressive findings. No worrisome pulmonary nodules to suggest pulmonary metastatic disease. No pleural effusions pleural nodules. Patchy areas of tree-in-bud type findings are noted the right lower lobe likely reflecting an area of inflammation. Stable right basilar atelectasis or scarring. Musculoskeletal: No significant bony findings. CT ABDOMEN PELVIS FINDINGS Hepatobiliary: There are a few scattered small low-attenuation liver lesions. These appear to be stable simple cysts based on the prior MRI from 10/10/2020. No intrahepatic biliary dilatation. Stable cholelithiasis. No common bile duct dilatation. Pancreas: No mass, inflammation or ductal dilatation. Stable calcifications in the lower pancreatic head. Spleen: Normal size. No focal lesions. Adrenals/Urinary Tract: Adrenal glands are unremarkable and stable. Stable simple appearing bilateral renal cysts. 2.8 x 2.2 x 2.1 cm rounded higher attenuation lesion in the lower pole region of left kidney previously measured 2.4 x 19  x 17 mm. Stable very  small high attenuation lesion in the midpole region of the right kidney posteriorly on image 74/2 most consistent with a hemorrhagic cyst. The bladder is unremarkable. Stomach/Bowel: The stomach, duodenum, small bowel and colon are grossly normal. Stable colonic diverticulosis. The terminal ileum and appendix are normal. Vascular/Lymphatic: Stable aortic calcifications but no aneurysm. The left para-aortic node on image number 74/2 measures 13.5 x 8 mm. It previously measured 15.5 x 12.5 mm. No new or progressive retroperitoneal adenopathy. Reproductive: The prostate gland and seminal vesicles are unremarkable. Other: No pelvic mass or adenopathy. No free pelvic fluid collections. No inguinal mass or adenopathy. No abdominal wall hernia or subcutaneous lesions. Musculoskeletal: No significant bony findings. Stable advanced degenerative disc disease the lower lumbar spine. No worrisome bone lesions. IMPRESSION: 1. Stable emphysematous changes and pulmonary scarring. 2. Stable rounded subpleural densities in the right middle lobe and left upper lobe, likely areas of rounded atelectasis or remote pulmonary infarcts. No new or progressive findings. 3. Slight interval enlargement the rounded hyperdense lesion in the lower pole region of the left kidney. 4. Slight interval decrease in size of the left para-aortic nodes. 5. Stable multinodular thyroid goiter. 19 mm right thyroid nodule. Recommend thyroid ultrasound. 6. Stable hepatic and renal cysts. 7. Cholelithiasis. Aortic Atherosclerosis (ICD10-I70.0) and Emphysema (ICD10-J43.9). Electronically Signed   By: Marijo Sanes M.D.   On: 09/15/2021 09:16        ASSESSMENT & PLAN:  1. Encounter for antineoplastic chemotherapy   2. Cancer of kidney, left ([place])   3. Anemia due to stage 4 chronic kidney disease ([place])   4. Chemotherapy induced diarrhea   5. Primary hypertension   6. Other acute pulmonary embolism without acute cor pulmonale ([place])    Cancer Staging   Cancer of kidney, left [facility]) Staging form: Kidney, AJCC 8th Edition - Clinical stage from 11/28/2020: Stage III (rcT1a, cN1, cM0) - Signed by Earlie Server, MD on 11/28/2020   Recurrent left papillary renal cell carcinoma, Patient declines surgery.  with nodal involvement- SBRT to nodal station in 11/2020 by Dr.Manning.  Labs are reviewed and discussed with patient. 09/14/21 CT chest abdomen pelvis without contrast showed mixed response.   Enlarging left kidney mass, 2.8 x 2.2 x 2.1cm, previously 2.4 x 1.9 x 1.7 Slight interval decrease in size of left periaortic nodes. Stable rounded subpleural densities in the right middle lobe and left upper lobe. For now, continue cabozantinib 40 mg I am concerned about the progression of the kidney mass.  we will touch base with interventional radiology to see if cryoablation at this point is feasible. Short term follow up with CT and switch to second line treatments.     # acute pulmonary embolism. He has finished 6 months of Eliquis 5mg  BID, recommend to switch to 2.5mg  BID. New Rx sent.    Hypertension, cabozantinib 40 mg daily follow-up with primary care provider.  Blood pressure has been stable.  Appreciate primary care provider input.  Diarrhea, Recommend imodium PRN as instructed.  Symptoms has resolved.  CKD stage IV, Encourage oral hydration. SPEP negative, light chain ratio was slightly elevated, -expected in CKD. Creatinine is stable.  Avoid nephrotoxins.  Encourage oral hydration. Anemia, multifactorial, CKD, chemotherapy  Follow up in 5-6  weeks.   Orders Placed This Encounter  Procedures   CT CHEST ABDOMEN PELVIS WO CONTRAST    Standing Status:   Future    Standing Expiration Date:   10/28/2022    Order Specific  Question:   If indicated for the ordered procedure, I authorize the administration of contrast media per Radiology protocol    Answer:   Yes    Order Specific Question:   Preferred imaging location?    Answer:   [facility]     Order Specific Question:   Is Oral Contrast requested for this exam?    Answer:   Yes, Per Radiology protocol   CBC with Differential/Platelet    Standing Status:   Future    Standing Expiration Date:   10/28/2022   Comprehensive metabolic panel    Standing Status:   Future    Standing Expiration Date:   10/28/2022    All questions were answered. The patient knows to call the clinic with any problems questions or concerns.  cc Jodi Marble, MD    Earlie Server, MD, PhD  10/27/2021

## 2021-10-30 ENCOUNTER — Telehealth: Payer: Self-pay

## 2021-10-30 DIAGNOSIS — C642 Malignant neoplasm of left kidney, except renal pelvis: Secondary | ICD-10-CM

## 2021-10-30 NOTE — Telephone Encounter (Signed)
Please cancel CT on 3/31 and schedule MRI abdomen ASAP. Please inform Aniceto Boss, caregiver [phone]) of appts. She is aware of plan. Keep lab/MD as scheduled.  ?

## 2021-10-30 NOTE — Telephone Encounter (Signed)
----- Message from Earlie Server, MD sent at 10/30/2021  1:43 PM EST ----- ?Regarding: FW: mutual patient ?See below. Please cancel CT, arrange  abdominal MRI. I will talk to you for more details.  ?----- Message ----- ?From: Sandi Mariscal, MD ?Sent: 10/30/2021   1:25 PM EST ?To: Raynelle Bring, MD, Tyler Pita, MD, # ?Subject: RE: mutual patient                            ? ?Appreciate everyone's thoughts. ? ?Dr. Tasia Catchings - please send Dr. Tammi Klippel and me a note once the MRI is completed so we can discuss and come up with a plan. ? ?Thx, ?Ulice Dash ? ? ? ?----- Message ----- ?From: Raynelle Bring, MD ?Sent: 10/30/2021   1:05 PM EST ?To: Tyler Pita, MD, Sandi Mariscal, MD, # ?Subject: RE: mutual patient                            ? ?He's already refused any surgical options so I'll let you and Ulice Dash decide.  I think SBRT might be the least morbid which is probably a key deciding factor in this patient who is not likely to be cured. ?----- Message ----- ?From: Tyler Pita, MD ?Sent: 10/30/2021  11:07 AM EST ?To: Raynelle Bring, MD, Sandi Mariscal, MD, Earlie Server, MD ?Subject: RE: mutual patient                            ? ?I wanted to wait for input from everyone else.  SBRT is a non-invasive option that is being increasingly used to treat primary renal cell cancer.  When the tumor is well-visualized on CT/MRI and has discreet borders, it should be amenable to accurate targeting.  In limited experience, I find that proximity to the duodenum may limit how much dose can be delivered.  In this case, there appears to be adequate spacing for full dose.  We can re-visit after the MRI, but, I would still place SBRT as a third option if resection and image-guided ablation are ruled out, simply based on the [facility] institutional experience with each. ? ? ?----- Message ----- ?From: Raynelle Bring, MD ?Sent: 10/30/2021   9:49 AM EST ?To: Tyler Pita, MD, Sandi Mariscal, MD, # ?Subject: RE: mutual patient                            ? ?I certainly think treating  the primary is reasonable if his renal function is stable and ok.  If perc ablation is risky, maybe Matt can weigh in on SBRT with some of new data suggesting a role in this setting. ? ? ?----- Message ----- ?From: Sandi Mariscal, MD ?Sent: 10/30/2021   8:57 AM EST ?To: Raynelle Bring, MD, Tyler Pita, MD, # ?Subject: RE: mutual patient                            ? ?I'm glad your systemic treatment seems to be having a good effect. ? ?I would be interested in Dr. Lynne Logan (the pt's Urologist cc'd on my response) thoughts if there is a role for addressing the primary in the setting of metastatic disease. ? ?If so, I would recommend getting an abdominal MRI (CT scans have been non-con d/t CRI) for comparison  to the 10/10/20 and to ensure there are no additional worrisome renal lesions. ? ?IF pt is deemed appropriate and Dr. Alinda Money doesn't feel the pt is an operative candidate, the pt may be a candidate for image guided ablation, though the location of the levsion (para-pelvic) is less than ideal d/t risk of injury to the collection system and/or hematuria/vessel injury. ? ?Thx, ?Ulice Dash ? ? ?----- Message ----- ?From: Earlie Server, MD ?Sent: 10/28/2021   4:29 PM EST ?To: Tyler Pita, MD, Sandi Mariscal, MD ?Subject: mutual patient                                ? ?Drs.Tammi Klippel and Pascal Lux,  ?Mr.Randleman has been on cabozantinib treatment and has been doing well.  ?Recent CT showed continued decrease of  left para-aortic nodes, but the kidney mass previously responded to treatments, has increased in size.  ?I am wondering if he can get any local treatment. Appreciate your thoughts. ?Thanks.  ? ?Earlie Server ? ? ? ? ? ? ? ? ?

## 2021-10-30 NOTE — Telephone Encounter (Signed)
MRI needs to be with and without ? ?

## 2021-10-31 ENCOUNTER — Other Ambulatory Visit: Payer: Self-pay

## 2021-10-31 DIAGNOSIS — C642 Malignant neoplasm of left kidney, except renal pelvis: Secondary | ICD-10-CM

## 2021-10-31 NOTE — Telephone Encounter (Signed)
Done pt aware °

## 2021-11-08 ENCOUNTER — Other Ambulatory Visit: Payer: Self-pay

## 2021-11-08 ENCOUNTER — Ambulatory Visit
Admission: RE | Admit: 2021-11-08 | Discharge: 2021-11-08 | Disposition: A | Discharge status: Home / Self Care | Payer: Medicare Other | Source: Ambulatory Visit | Attending: Oncology | Admitting: Oncology

## 2021-11-08 DIAGNOSIS — C642 Malignant neoplasm of left kidney, except renal pelvis: Secondary | ICD-10-CM | POA: Insufficient documentation

## 2021-11-08 DIAGNOSIS — K862 Cyst of pancreas: Secondary | ICD-10-CM | POA: Diagnosis not present

## 2021-11-08 DIAGNOSIS — N281 Cyst of kidney, acquired: Secondary | ICD-10-CM | POA: Diagnosis not present

## 2021-11-08 DIAGNOSIS — K8020 Calculus of gallbladder without cholecystitis without obstruction: Secondary | ICD-10-CM | POA: Diagnosis not present

## 2021-11-08 DIAGNOSIS — C649 Malignant neoplasm of unspecified kidney, except renal pelvis: Secondary | ICD-10-CM | POA: Diagnosis not present

## 2021-11-08 MED ORDER — GADOBUTROL 1 MMOL/ML IV SOLN
10.0000 mL | Freq: Once | INTRAVENOUS | Status: AC | PRN
  Administered 2021-11-08: 10 mL via INTRAVENOUS

## 2021-11-14 ENCOUNTER — Other Ambulatory Visit (HOSPITAL_COMMUNITY): Payer: Self-pay

## 2021-11-15 DIAGNOSIS — N184 Chronic kidney disease, stage 4 (severe): Secondary | ICD-10-CM | POA: Diagnosis not present

## 2021-11-15 DIAGNOSIS — R809 Proteinuria, unspecified: Secondary | ICD-10-CM | POA: Diagnosis not present

## 2021-11-15 DIAGNOSIS — I129 Hypertensive chronic kidney disease with stage 1 through stage 4 chronic kidney disease, or unspecified chronic kidney disease: Secondary | ICD-10-CM | POA: Diagnosis not present

## 2021-11-15 DIAGNOSIS — E1122 Type 2 diabetes mellitus with diabetic chronic kidney disease: Secondary | ICD-10-CM | POA: Diagnosis not present

## 2021-11-15 DIAGNOSIS — N2889 Other specified disorders of kidney and ureter: Secondary | ICD-10-CM | POA: Diagnosis not present

## 2021-11-16 ENCOUNTER — Other Ambulatory Visit (HOSPITAL_COMMUNITY): Payer: Self-pay

## 2021-11-20 ENCOUNTER — Ambulatory Visit: Payer: Medicare Other | Admitting: Podiatry

## 2021-11-22 DIAGNOSIS — N184 Chronic kidney disease, stage 4 (severe): Secondary | ICD-10-CM | POA: Diagnosis not present

## 2021-11-22 DIAGNOSIS — E1122 Type 2 diabetes mellitus with diabetic chronic kidney disease: Secondary | ICD-10-CM | POA: Diagnosis not present

## 2021-11-22 DIAGNOSIS — I10 Essential (primary) hypertension: Secondary | ICD-10-CM | POA: Diagnosis not present

## 2021-11-22 DIAGNOSIS — R809 Proteinuria, unspecified: Secondary | ICD-10-CM | POA: Diagnosis not present

## 2021-11-22 DIAGNOSIS — D631 Anemia in chronic kidney disease: Secondary | ICD-10-CM | POA: Diagnosis not present

## 2021-11-22 DIAGNOSIS — N2581 Secondary hyperparathyroidism of renal origin: Secondary | ICD-10-CM | POA: Diagnosis not present

## 2021-11-23 ENCOUNTER — Ambulatory Visit (INDEPENDENT_AMBULATORY_CARE_PROVIDER_SITE_OTHER): Payer: Medicare Other | Admitting: Podiatry

## 2021-11-23 ENCOUNTER — Encounter: Payer: Self-pay | Admitting: Podiatry

## 2021-11-23 DIAGNOSIS — M79675 Pain in left toe(s): Secondary | ICD-10-CM

## 2021-11-23 DIAGNOSIS — M79674 Pain in right toe(s): Secondary | ICD-10-CM | POA: Diagnosis not present

## 2021-11-23 DIAGNOSIS — L84 Corns and callosities: Secondary | ICD-10-CM

## 2021-11-23 DIAGNOSIS — M216X2 Other acquired deformities of left foot: Secondary | ICD-10-CM

## 2021-11-23 DIAGNOSIS — B351 Tinea unguium: Secondary | ICD-10-CM

## 2021-11-23 DIAGNOSIS — E0843 Diabetes mellitus due to underlying condition with diabetic autonomic (poly)neuropathy: Secondary | ICD-10-CM

## 2021-11-23 DIAGNOSIS — N183 Chronic kidney disease, stage 3 unspecified: Secondary | ICD-10-CM

## 2021-11-23 DIAGNOSIS — M2010 Hallux valgus (acquired), unspecified foot: Secondary | ICD-10-CM

## 2021-11-23 NOTE — Progress Notes (Signed)
This patient returns to my office for at risk foot care.  This patient requires this care by a professional since this patient will be at risk due to having type 2 diabetes and stage 3 kidney disease  This patient is unable to cut nails himself since the patient cannot reach his nails.These nails are painful walking and wearing shoes. Painful callus outside ball of left foot and under the big toe joint right foot.  This patient presents for at risk foot care today. ? ?General Appearance  Alert, conversant and in no acute stress. ? ?Vascular  Dorsalis pedis and posterior tibial  pulses are palpable  bilaterally.  Capillary return is within normal limits  bilaterally. Temperature is within normal limits  bilaterally. ? ?Neurologic  Senn-Weinstein monofilament wire test absent bilaterally. Muscle power within normal limits bilaterally. ? ?Nails Thick disfigured discolored nails with subungual debris  from second to fifth toes bilaterally. No evidence of bacterial infection or drainage bilaterally. ? ?Orthopedic  No limitations of motion  feet .  No crepitus or effusions noted.  No bony pathology or digital deformities noted.  HAV  B/L.  Hammer toes  B/L. ? ?Skin  normotropic skin with no porokeratosis noted bilaterally.  No signs of infections or ulcers noted.   Callus sub 5th met left foot. Callus dorsomedial aspect right foot. ? ?Onychomycosis  Pain in right toes  Pain in left toes  Callus  B/L ? ?Consent was obtained for treatment procedures.   Mechanical debridement of nails 1-5  bilaterally performed with a nail nipper.  Filed with dremel without incident.  Debride calluses with # 15 blade followed by dremel tool.   ? ? ?Return office visit   3 months                  Told patient to return for periodic foot care and evaluation due to potential at risk complications. ? ? ?Gardiner Barefoot DPM  ?

## 2021-11-24 ENCOUNTER — Other Ambulatory Visit: Payer: Medicare Other

## 2021-11-27 ENCOUNTER — Encounter: Payer: Self-pay | Admitting: Oncology

## 2021-11-27 ENCOUNTER — Inpatient Hospital Stay: Payer: Medicare Other | Attending: Oncology

## 2021-11-27 ENCOUNTER — Inpatient Hospital Stay (HOSPITAL_BASED_OUTPATIENT_CLINIC_OR_DEPARTMENT_OTHER): Payer: Medicare Other | Admitting: Oncology

## 2021-11-27 VITALS — BP 172/83 | HR 50 | Temp 98.7°F | Resp 20 | Wt 228.3 lb

## 2021-11-27 DIAGNOSIS — Z5111 Encounter for antineoplastic chemotherapy: Secondary | ICD-10-CM | POA: Diagnosis not present

## 2021-11-27 DIAGNOSIS — I2699 Other pulmonary embolism without acute cor pulmonale: Secondary | ICD-10-CM | POA: Insufficient documentation

## 2021-11-27 DIAGNOSIS — C642 Malignant neoplasm of left kidney, except renal pelvis: Secondary | ICD-10-CM | POA: Diagnosis not present

## 2021-11-27 DIAGNOSIS — I129 Hypertensive chronic kidney disease with stage 1 through stage 4 chronic kidney disease, or unspecified chronic kidney disease: Secondary | ICD-10-CM | POA: Diagnosis not present

## 2021-11-27 DIAGNOSIS — E042 Nontoxic multinodular goiter: Secondary | ICD-10-CM | POA: Diagnosis not present

## 2021-11-27 DIAGNOSIS — E041 Nontoxic single thyroid nodule: Secondary | ICD-10-CM

## 2021-11-27 DIAGNOSIS — D631 Anemia in chronic kidney disease: Secondary | ICD-10-CM

## 2021-11-27 DIAGNOSIS — N184 Chronic kidney disease, stage 4 (severe): Secondary | ICD-10-CM | POA: Diagnosis not present

## 2021-11-27 DIAGNOSIS — Z7901 Long term (current) use of anticoagulants: Secondary | ICD-10-CM | POA: Diagnosis not present

## 2021-11-27 LAB — CBC WITH DIFFERENTIAL/PLATELET
Abs Immature Granulocytes: 0.06 10*3/uL (ref 0.00–0.07)
Basophils Absolute: 0 10*3/uL (ref 0.0–0.1)
Basophils Relative: 1 %
Eosinophils Absolute: 0 10*3/uL (ref 0.0–0.5)
Eosinophils Relative: 0 %
HCT: 33.2 % — ABNORMAL LOW (ref 39.0–52.0)
Hemoglobin: 11.3 g/dL — ABNORMAL LOW (ref 13.0–17.0)
Immature Granulocytes: 2 %
Lymphocytes Relative: 32 %
Lymphs Abs: 1.3 10*3/uL (ref 0.7–4.0)
MCH: 34 pg (ref 26.0–34.0)
MCHC: 34 g/dL (ref 30.0–36.0)
MCV: 100 fL (ref 80.0–100.0)
Monocytes Absolute: 0.4 10*3/uL (ref 0.1–1.0)
Monocytes Relative: 9 %
Neutro Abs: 2.3 10*3/uL (ref 1.7–7.7)
Neutrophils Relative %: 56 %
Platelets: 261 10*3/uL (ref 150–400)
RBC: 3.32 MIL/uL — ABNORMAL LOW (ref 4.22–5.81)
RDW: 16.9 % — ABNORMAL HIGH (ref 11.5–15.5)
WBC: 4 10*3/uL (ref 4.0–10.5)
nRBC: 0 % (ref 0.0–0.2)

## 2021-11-27 LAB — COMPREHENSIVE METABOLIC PANEL
ALT: 18 U/L (ref 0–44)
AST: 20 U/L (ref 15–41)
Albumin: 3.5 g/dL (ref 3.5–5.0)
Alkaline Phosphatase: 41 U/L (ref 38–126)
Anion gap: 6 (ref 5–15)
BUN: 31 mg/dL — ABNORMAL HIGH (ref 8–23)
CO2: 26 mmol/L (ref 22–32)
Calcium: 8.5 mg/dL — ABNORMAL LOW (ref 8.9–10.3)
Chloride: 111 mmol/L (ref 98–111)
Creatinine, Ser: 3.29 mg/dL — ABNORMAL HIGH (ref 0.61–1.24)
GFR, Estimated: 19 mL/min — ABNORMAL LOW (ref >60)
Glucose, Bld: 115 mg/dL — ABNORMAL HIGH (ref 70–99)
Potassium: 3.7 mmol/L (ref 3.5–5.1)
Sodium: 143 mmol/L (ref 135–145)
Total Bilirubin: 0.4 mg/dL (ref 0.3–1.2)
Total Protein: 5.9 g/dL — ABNORMAL LOW (ref 6.5–8.1)

## 2021-11-27 NOTE — Progress Notes (Addendum)
?Hematology/Oncology follow up note ?[phone] [phone] ? ? ?Patient Care Team: ?Jodi Marble, MD as PCP - General (Internal Medicine) ?Marden Noble, MD (Internal Medicine) ?Tyler Pita, MD as Consulting Physician (Radiation Oncology) ? ?REFERRING PROVIDER: ?Jodi Marble, MD  ?CHIEF COMPLAINTS/REASON FOR VISIT:  ?Follow-up for treatment of recurrent RCC ? ?HISTORY OF PRESENTING ILLNESS:  ? ?Riley Stuart is a  71 y.o.  male with PMH listed below was seen in consultation at the request of  Jodi Marble, MD  for evaluation of RCC ?Patient has schizoaffective disorder and lives in the group home.  He is quite functional at baseline and makes his own medical decision. ?11/27/2018 CT abdomen pelvis without contrast showed solid-appearing mass of the lower pole of the left kidney measuring up to 6.8 cm concerning for RCC. 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 ?08/10/2019, patient underwent partial left nephrectomy and results showed papillary renal cell carcinoma, type II, nuclear grade 3.  Size 9 cm.  Tumor invades perirenal fat (pT3a). ?Patient went image surveillance. ?10/10/2020 , MRI abdomen with and without contrast showed new 2.4 x 1.8 cm faintly enhancing nodularity along the left kidney lower pole partial nephrectomy sites.  Suspicious for local recurrence.  New pathologically enlarged left periaortic lymph node, highly suspicious for malignancy in this context.  Numerous bilateral renal cysts.:  Diverticulosis lumbar spondylosis and degenerative disease 1.4 gallstone in the gallbladder. ? ?Per note, urology Dr. Alinda Money has offered patient definitive surgical resection as well as regional lymph node dissection however patient declined the surgery due to his chronic kidney insufficiency and potential need of requiring dialysis after radical nephrectomy. ? ?12/09/2020 status post SBRT to the periaortic lymph node radiation  oncology Dr. Tammi Klippel .  He was also seen by Dr. Pascal Lux for possible cryoablation.  Dr. Pascal Lux recommend repeat CT abdomen which has been ordered. ? ?# 12/15/2020, CT abdomen without contrast showed nodular changes of the left retroperitoneum in the surgical bed suspicious for areas of disease recurrence in the left retroperitoneum and along the left flank.  Signs of increased soft tissue density about the inferior left renal sinus fat bowel following partial nephrectomy. ?Other scattered small nodules seen inferiorly along the plane of the sigmoid mesocolon not present on preoperative evaluations may serve as additional evidence of disease.   ? ?# Initially there was plan for cryoablation.  Patient was evaluated by IR Dr. Pascal Lux, however due to the disease progression on recent CT, Dr. Pascal Lux does not feel that patient will benefit from cryoablation at this point.  ? ?01/20/2021, started on cabozantinib 40 mg daily. ? ?09/14/21 CT chest abdomen pelvis without contrast showed mixed response.   ?Enlarging left kidney mass, 2.8 x 2.2 x 2.1cm, previously 2.4 x 1.9 x 1.7 ?Slight interval decrease in size of left periaortic nodes. ?Stable rounded subpleural densities in the right middle lobe and left upper lobe. ? ?INTERVAL HISTORY ?Riley Stuart is a 71 y.o. male who has above history reviewed by me today presents for follow up visit for management of recurrent RCC ?Reports doing well. No new complaitns.  ?He is on Cabozantinib '40mg'$  daily.  Patient was accompanied by facility staff.   ?Once feeling well.  Recently BP medication has been adjusted by PCP, minoxidil was added. ?In the clinic, initial BP was 172/83, recheck BP showed decreased systolic blood pressure to 160s. ? ? ?Review of Systems  ?Constitutional:  Negative for appetite change, chills, fatigue, fever and unexpected  weight change.  ?HENT:   Negative for hearing loss and voice change.   ?Eyes:  Negative for eye problems and icterus.  ?Respiratory:  Negative  for chest tightness, cough and shortness of breath.   ?Cardiovascular:  Negative for chest pain and leg swelling.  ?Gastrointestinal:  Negative for abdominal distention and abdominal pain.  ?Endocrine: Negative for hot flashes.  ?Genitourinary:  Negative for difficulty urinating, dysuria and frequency.   ?Musculoskeletal:  Negative for arthralgias.  ?Skin:  Negative for itching and rash.  ?Neurological:  Negative for light-headedness and numbness.  ?Hematological:  Negative for adenopathy. Does not bruise/bleed easily.  ?Psychiatric/Behavioral:  Negative for confusion.   ? ?MEDICAL HISTORY:  ?Past Medical History:  ?Diagnosis Date  ? Chronic kidney disease   ? renal insufficiency  ? Diabetes mellitus without complication ([place])   ? Pt takes Insulin  ? Hypertension   ? Mental disorder   ? schizoaffective  ? Renal cell carcinoma ([place])   ? Wears dentures   ? full upper and lower  ? ? ?SURGICAL HISTORY: ?Past Surgical History:  ?Procedure Laterality Date  ? ANKLE FRACTURE SURGERY    ? CATARACT EXTRACTION W/PHACO Left 03/08/2021  ? Procedure: CATARACT EXTRACTION PHACO AND INTRAOCULAR LENS PLACEMENT ([place]) LEFT DIABETIC 8.48 01:06.9;  Surgeon: Leandrew Koyanagi, MD;  Location: [facility];  Service: Ophthalmology;  Laterality: Left;  Diabetic - insulin  ? CATARACT EXTRACTION W/PHACO Right 03/22/2021  ? Procedure: CATARACT EXTRACTION PHACO AND INTRAOCULAR LENS PLACEMENT ([facility]) RIGHT DIABETIC 6.14 01:08.8;  Surgeon: Leandrew Koyanagi, MD;  Location: [facility];  Service: Ophthalmology;  Laterality: Right;  Diabetic - insulin  ? COLONOSCOPY WITH PROPOFOL N/A 04/15/2020  ? Procedure: COLONOSCOPY WITH PROPOFOL;  Surgeon: Virgel Manifold, MD;  Location: [facility];  Service: Endoscopy;  Laterality: N/A;  ? CRANIOTOMY Left 10/27/2013  ? Procedure: Craniotomy for Aneurysm Clipping;  Surgeon: Consuella Lose, MD;  Location: [facility];  Service: Neurosurgery;  Laterality: Left;  ? IR RADIOLOGIST EVAL &  MGMT  11/22/2020  ? IR RADIOLOGIST EVAL & MGMT  12/21/2020  ? KNEE SURGERY    ? due to fracture.   ? ROBOTIC ASSITED PARTIAL NEPHRECTOMY Left 08/10/2019  ? Procedure: XI ROBOTIC ASSITED LAPAROSCOPIC  PARTIAL NEPHRECTOMY;  Surgeon: Raynelle Bring, MD;  Location: [facility];  Service: Urology;  Laterality: Left;  ? ? ?SOCIAL HISTORY: ?Social History  ? ?Socioeconomic History  ? Marital status: Single  ?  Spouse name: Not on file  ? Number of children: 0  ? Years of education: Not on file  ? Highest education level: Not on file  ?Occupational History  ? Not on file  ?Tobacco Use  ? Smoking status: Every Day  ?  Packs/day: 0.50  ?  Years: 48.00  ?  Pack years: 24.00  ?  Types: Cigarettes  ? Smokeless tobacco: Never  ?Vaping Use  ? Vaping Use: Never used  ?Substance and Sexual Activity  ? Alcohol use: Not Currently  ? Drug use: Never  ? Sexual activity: Not Currently  ?Other Topics Concern  ? Not on file  ?Social History Narrative  ? Not on file  ? ?Social Determinants of Health  ? ?Financial Resource Strain: Not on file  ?Food Insecurity: Not on file  ?Transportation Needs: Not on file  ?Physical Activity: Not on file  ?Stress: Not on file  ?Social Connections: Not on file  ?Intimate Partner Violence: Not on file  ? ? ?FAMILY HISTORY: ?Family History  ?Problem Relation Age  of Onset  ? Breast cancer Mother   ? Throat cancer Brother   ? Prostate cancer Neg Hx   ? Colon cancer Neg Hx   ? Pancreatic cancer Neg Hx   ? ? ?ALLERGIES:  has No Known Allergies. ? ?MEDICATIONS:  ?Current Outpatient Medications  ?Medication Sig Dispense Refill  ? acetaminophen (TYLENOL) 650 MG CR tablet Take by mouth.    ? amLODipine-valsartan (EXFORGE) 10-320 MG tablet Take 1 tablet by mouth daily.    ? apixaban (ELIQUIS) 2.5 MG TABS tablet Take 1 tablet (2.5 mg total) by mouth 2 (two) times daily. 60 tablet 3  ? atorvastatin (LIPITOR) 10 MG tablet Take 1 tablet by mouth daily.    ? cabozantinib (CABOMETYX) 40 MG tablet Take 1 tablet (40 mg total) by  mouth daily. Take on an empty stomach, 1 hour before or 2 hours after meals. 30 tablet 1  ? calcium carbonate (OS-CAL) 600 MG TABS tablet Take 2 tablets (1,200 mg total) by mouth 2 (two) times daily wit

## 2021-11-28 ENCOUNTER — Other Ambulatory Visit (HOSPITAL_COMMUNITY): Payer: Self-pay

## 2021-11-28 ENCOUNTER — Telehealth: Payer: Self-pay

## 2021-11-28 DIAGNOSIS — E041 Nontoxic single thyroid nodule: Secondary | ICD-10-CM

## 2021-11-28 NOTE — Telephone Encounter (Signed)
Spoke with Sasha at group home and informed her of CT results showing a Thyroid nodule. Advised per Dr. Tasia Catchings to have [place] of thyroid done next available. Informed Sasha of plan. She verbalized understanding.  ? ? ?Please schedule patient of [place] of thyroid next available. Please inform of appt. Thanks  ?

## 2021-11-28 NOTE — Telephone Encounter (Signed)
-----   Message from Earlie Server, MD sent at 11/27/2021  9:03 PM EDT ----- ?Please let patient/facility staff know that on his previous CT, there is also a nodule in his thyroid. ?[We have been focused on the kidney lesion and have not get a chance to talk about the thyroid nodule.]. ?I recommend ultrasound thyroid [for thyroid nodule work-up ]for further evaluation.  If he is okay with that, please go ahead and arrange.  Next available is fine. ? ?

## 2021-11-30 ENCOUNTER — Other Ambulatory Visit (HOSPITAL_COMMUNITY): Payer: Self-pay

## 2021-12-07 ENCOUNTER — Ambulatory Visit
Admission: RE | Admit: 2021-12-07 | Discharge: 2021-12-07 | Disposition: A | Discharge status: Home / Self Care | Payer: Medicare Other | Source: Ambulatory Visit | Attending: Oncology | Admitting: Oncology

## 2021-12-07 DIAGNOSIS — E041 Nontoxic single thyroid nodule: Secondary | ICD-10-CM | POA: Diagnosis not present

## 2021-12-18 ENCOUNTER — Ambulatory Visit: Payer: Medicare Other | Admitting: Podiatry

## 2021-12-21 ENCOUNTER — Other Ambulatory Visit (HOSPITAL_COMMUNITY): Payer: Self-pay

## 2021-12-25 ENCOUNTER — Other Ambulatory Visit: Payer: Self-pay | Admitting: Oncology

## 2021-12-25 ENCOUNTER — Other Ambulatory Visit (HOSPITAL_COMMUNITY): Payer: Self-pay

## 2021-12-25 DIAGNOSIS — C642 Malignant neoplasm of left kidney, except renal pelvis: Secondary | ICD-10-CM

## 2021-12-27 ENCOUNTER — Other Ambulatory Visit: Payer: Self-pay | Admitting: Oncology

## 2021-12-27 ENCOUNTER — Other Ambulatory Visit (HOSPITAL_COMMUNITY): Payer: Self-pay

## 2021-12-27 DIAGNOSIS — C642 Malignant neoplasm of left kidney, except renal pelvis: Secondary | ICD-10-CM

## 2021-12-28 ENCOUNTER — Other Ambulatory Visit (HOSPITAL_COMMUNITY): Payer: Self-pay

## 2021-12-28 MED ORDER — CABOMETYX 40 MG PO TABS
40.0000 mg | ORAL_TABLET | Freq: Every day | ORAL | 1 refills | Status: DC
  Filled 2021-12-28: qty 30, 30d supply, fill #0
  Filled 2022-01-18: qty 30, 30d supply, fill #1

## 2022-01-01 DIAGNOSIS — N184 Chronic kidney disease, stage 4 (severe): Secondary | ICD-10-CM | POA: Diagnosis not present

## 2022-01-01 DIAGNOSIS — E119 Type 2 diabetes mellitus without complications: Secondary | ICD-10-CM | POA: Diagnosis not present

## 2022-01-02 ENCOUNTER — Other Ambulatory Visit (HOSPITAL_COMMUNITY): Payer: Self-pay

## 2022-01-02 DIAGNOSIS — C649 Malignant neoplasm of unspecified kidney, except renal pelvis: Secondary | ICD-10-CM | POA: Diagnosis not present

## 2022-01-02 DIAGNOSIS — E119 Type 2 diabetes mellitus without complications: Secondary | ICD-10-CM | POA: Diagnosis not present

## 2022-01-02 DIAGNOSIS — E785 Hyperlipidemia, unspecified: Secondary | ICD-10-CM | POA: Diagnosis not present

## 2022-01-02 DIAGNOSIS — N184 Chronic kidney disease, stage 4 (severe): Secondary | ICD-10-CM | POA: Diagnosis not present

## 2022-01-02 DIAGNOSIS — I10 Essential (primary) hypertension: Secondary | ICD-10-CM | POA: Diagnosis not present

## 2022-01-03 ENCOUNTER — Other Ambulatory Visit (HOSPITAL_COMMUNITY): Payer: Self-pay

## 2022-01-09 ENCOUNTER — Other Ambulatory Visit: Payer: Self-pay | Admitting: Oncology

## 2022-01-09 ENCOUNTER — Other Ambulatory Visit: Payer: Self-pay | Admitting: Pharmacist

## 2022-01-09 DIAGNOSIS — C642 Malignant neoplasm of left kidney, except renal pelvis: Secondary | ICD-10-CM

## 2022-01-10 ENCOUNTER — Other Ambulatory Visit: Payer: Self-pay | Admitting: Oncology

## 2022-01-10 MED ORDER — CALCIUM CARBONATE 600 MG PO TABS: 1200.0000 mg | ORAL_TABLET | Freq: Two times a day (BID) | ORAL | 3 refills | Status: AC

## 2022-01-17 DIAGNOSIS — E1122 Type 2 diabetes mellitus with diabetic chronic kidney disease: Secondary | ICD-10-CM | POA: Diagnosis not present

## 2022-01-17 DIAGNOSIS — N184 Chronic kidney disease, stage 4 (severe): Secondary | ICD-10-CM | POA: Diagnosis not present

## 2022-01-18 ENCOUNTER — Other Ambulatory Visit (HOSPITAL_COMMUNITY): Payer: Self-pay

## 2022-01-24 DIAGNOSIS — N2581 Secondary hyperparathyroidism of renal origin: Secondary | ICD-10-CM | POA: Diagnosis not present

## 2022-01-24 DIAGNOSIS — E1122 Type 2 diabetes mellitus with diabetic chronic kidney disease: Secondary | ICD-10-CM | POA: Diagnosis not present

## 2022-01-24 DIAGNOSIS — I10 Essential (primary) hypertension: Secondary | ICD-10-CM | POA: Diagnosis not present

## 2022-01-24 DIAGNOSIS — D631 Anemia in chronic kidney disease: Secondary | ICD-10-CM | POA: Diagnosis not present

## 2022-01-24 DIAGNOSIS — N184 Chronic kidney disease, stage 4 (severe): Secondary | ICD-10-CM | POA: Diagnosis not present

## 2022-01-24 DIAGNOSIS — R809 Proteinuria, unspecified: Secondary | ICD-10-CM | POA: Diagnosis not present

## 2022-01-29 ENCOUNTER — Other Ambulatory Visit: Payer: Self-pay

## 2022-01-29 ENCOUNTER — Other Ambulatory Visit (HOSPITAL_COMMUNITY): Payer: Self-pay

## 2022-01-29 DIAGNOSIS — E041 Nontoxic single thyroid nodule: Secondary | ICD-10-CM

## 2022-01-30 ENCOUNTER — Inpatient Hospital Stay: Payer: Medicare Other | Attending: Oncology

## 2022-01-30 ENCOUNTER — Other Ambulatory Visit (HOSPITAL_COMMUNITY): Payer: Self-pay

## 2022-01-30 ENCOUNTER — Encounter: Payer: Self-pay | Admitting: Oncology

## 2022-01-30 ENCOUNTER — Inpatient Hospital Stay (HOSPITAL_BASED_OUTPATIENT_CLINIC_OR_DEPARTMENT_OTHER): Payer: Medicare Other | Admitting: Oncology

## 2022-01-30 VITALS — BP 158/83 | HR 55 | Temp 96.9°F | Wt 227.0 lb

## 2022-01-30 DIAGNOSIS — E041 Nontoxic single thyroid nodule: Secondary | ICD-10-CM

## 2022-01-30 DIAGNOSIS — N184 Chronic kidney disease, stage 4 (severe): Secondary | ICD-10-CM | POA: Insufficient documentation

## 2022-01-30 DIAGNOSIS — D631 Anemia in chronic kidney disease: Secondary | ICD-10-CM | POA: Insufficient documentation

## 2022-01-30 DIAGNOSIS — I10 Essential (primary) hypertension: Secondary | ICD-10-CM | POA: Diagnosis not present

## 2022-01-30 DIAGNOSIS — Z5111 Encounter for antineoplastic chemotherapy: Secondary | ICD-10-CM

## 2022-01-30 DIAGNOSIS — C642 Malignant neoplasm of left kidney, except renal pelvis: Secondary | ICD-10-CM | POA: Diagnosis not present

## 2022-01-30 DIAGNOSIS — I129 Hypertensive chronic kidney disease with stage 1 through stage 4 chronic kidney disease, or unspecified chronic kidney disease: Secondary | ICD-10-CM | POA: Diagnosis not present

## 2022-01-30 DIAGNOSIS — E785 Hyperlipidemia, unspecified: Secondary | ICD-10-CM | POA: Diagnosis not present

## 2022-01-30 DIAGNOSIS — K521 Toxic gastroenteritis and colitis: Secondary | ICD-10-CM | POA: Insufficient documentation

## 2022-01-30 DIAGNOSIS — C649 Malignant neoplasm of unspecified kidney, except renal pelvis: Secondary | ICD-10-CM | POA: Diagnosis not present

## 2022-01-30 DIAGNOSIS — E119 Type 2 diabetes mellitus without complications: Secondary | ICD-10-CM | POA: Diagnosis not present

## 2022-01-30 DIAGNOSIS — T451X5A Adverse effect of antineoplastic and immunosuppressive drugs, initial encounter: Secondary | ICD-10-CM

## 2022-01-30 LAB — CBC WITH DIFFERENTIAL/PLATELET
Abs Immature Granulocytes: 0.06 10*3/uL (ref 0.00–0.07)
Basophils Absolute: 0 10*3/uL (ref 0.0–0.1)
Basophils Relative: 0 %
Eosinophils Absolute: 0 10*3/uL (ref 0.0–0.5)
Eosinophils Relative: 0 %
HCT: 33.3 % — ABNORMAL LOW (ref 39.0–52.0)
Hemoglobin: 11.6 g/dL — ABNORMAL LOW (ref 13.0–17.0)
Immature Granulocytes: 1 %
Lymphocytes Relative: 22 %
Lymphs Abs: 1 10*3/uL (ref 0.7–4.0)
MCH: 34 pg (ref 26.0–34.0)
MCHC: 34.8 g/dL (ref 30.0–36.0)
MCV: 97.7 fL (ref 80.0–100.0)
Monocytes Absolute: 0.4 10*3/uL (ref 0.1–1.0)
Monocytes Relative: 8 %
Neutro Abs: 3.1 10*3/uL (ref 1.7–7.7)
Neutrophils Relative %: 69 %
Platelets: 266 10*3/uL (ref 150–400)
RBC: 3.41 MIL/uL — ABNORMAL LOW (ref 4.22–5.81)
RDW: 17 % — ABNORMAL HIGH (ref 11.5–15.5)
WBC: 4.6 10*3/uL (ref 4.0–10.5)
nRBC: 0.4 % — ABNORMAL HIGH (ref 0.0–0.2)

## 2022-01-30 LAB — COMPREHENSIVE METABOLIC PANEL
ALT: 21 U/L (ref 0–44)
AST: 25 U/L (ref 15–41)
Albumin: 3.4 g/dL — ABNORMAL LOW (ref 3.5–5.0)
Alkaline Phosphatase: 33 U/L — ABNORMAL LOW (ref 38–126)
Anion gap: 8 (ref 5–15)
BUN: 38 mg/dL — ABNORMAL HIGH (ref 8–23)
CO2: 24 mmol/L (ref 22–32)
Calcium: 7.8 mg/dL — ABNORMAL LOW (ref 8.9–10.3)
Chloride: 112 mmol/L — ABNORMAL HIGH (ref 98–111)
Creatinine, Ser: 3.59 mg/dL — ABNORMAL HIGH (ref 0.61–1.24)
GFR, Estimated: 17 mL/min — ABNORMAL LOW (ref >60)
Glucose, Bld: 116 mg/dL — ABNORMAL HIGH (ref 70–99)
Potassium: 3.6 mmol/L (ref 3.5–5.1)
Sodium: 144 mmol/L (ref 135–145)
Total Bilirubin: 0.3 mg/dL (ref 0.3–1.2)
Total Protein: 6.4 g/dL — ABNORMAL LOW (ref 6.5–8.1)

## 2022-01-30 MED ORDER — CABOMETYX 40 MG PO TABS
40.0000 mg | ORAL_TABLET | Freq: Every day | ORAL | 1 refills | Status: AC
  Filled 2022-01-30 – 2022-02-20 (×2): qty 30, 30d supply, fill #0
  Filled 2022-03-23: qty 30, 30d supply, fill #1

## 2022-01-30 NOTE — Progress Notes (Signed)
Hematology/Oncology follow up note [phone] [phone]   Patient Care Team: Jodi Marble, MD as PCP - General (Internal Medicine) Marden Noble, MD (Internal Medicine) Tyler Pita, MD as Consulting Physician (Radiation Oncology)  REFERRING PROVIDER: Jodi Marble, MD  CHIEF COMPLAINTS/REASON FOR VISIT:  Follow-up for treatment of recurrent RCC  HISTORY OF PRESENTING ILLNESS:   Riley Stuart is a  71 y.o.  male with PMH listed below was seen in consultation at the request of  Jodi Marble, MD  for evaluation of [place] Patient has schizoaffective disorder and lives in the group home.  He is quite functional at baseline and makes his own medical decision. 11/27/2018 CT abdomen pelvis without contrast showed solid-appearing mass of the lower pole of the left kidney measuring up to 6.8 cm concerning for RCC. 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 08/10/2019, patient underwent partial left nephrectomy and results showed papillary renal cell carcinoma, type II, nuclear grade 3.  Size 9 cm.  Tumor invades perirenal fat (pT3a). Patient went image surveillance. 10/10/2020 , MRI abdomen with and without contrast showed new 2.4 x 1.8 cm faintly enhancing nodularity along the left kidney lower pole partial nephrectomy sites.  Suspicious for local recurrence.  New pathologically enlarged left periaortic lymph node, highly suspicious for malignancy in this context.  Numerous bilateral renal cysts.:  Diverticulosis lumbar spondylosis and degenerative disease 1.4 gallstone in the gallbladder.  Per note, urology Dr. Alinda Money has offered patient definitive surgical resection as well as regional lymph node dissection however patient declined the surgery due to his chronic kidney insufficiency and potential need of requiring dialysis after radical nephrectomy.  12/09/2020 status post SBRT to the periaortic lymph node radiation  oncology Dr. Tammi Klippel .  He was also seen by Dr. Pascal Lux for possible cryoablation.  Dr. Pascal Lux recommend repeat CT abdomen which has been ordered.  # 12/15/2020, CT abdomen without contrast showed nodular changes of the left retroperitoneum in the surgical bed suspicious for areas of disease recurrence in the left retroperitoneum and along the left flank.  Signs of increased soft tissue density about the inferior left renal sinus fat bowel following partial nephrectomy. Other scattered small nodules seen inferiorly along the plane of the sigmoid mesocolon not present on preoperative evaluations may serve as additional evidence of disease.    # Initially there was plan for cryoablation.  Patient was evaluated by IR Dr. Pascal Lux, however due to the disease progression on recent CT, Dr. Pascal Lux does not feel that patient will benefit from cryoablation at this point.   01/20/2021, started on cabozantinib 40 mg daily.  09/14/21 CT chest abdomen pelvis without contrast showed mixed response.   Enlarging left kidney mass, 2.8 x 2.2 x 2.1cm, previously 2.4 x 1.9 x 1.7 Slight interval decrease in size of left periaortic nodes. Stable rounded subpleural densities in the right middle lobe and left upper lobe.   11/08/2021, MRI showed simple/hemorrhagic renal cyst.  Surgical changes at the lower pole left kidney with no suspicious enhancement  appreciated.  Stable 6 mm hyperintense T2 cystic lesion in the head of pancreas.  INTERVAL HISTORY Riley Stuart is a 71 y.o. male who has above history reviewed by me today presents for follow up visit for management of recurrent RCC Reports doing well. No new complaitns.  He is on Cabozantinib '40mg'$  daily.  Patient was accompanied by facility staff.   Patient reports feeling well he has experienced intermittent diarrhea, he uses Imodium  as needed. BP is stable.   Review of Systems  Constitutional:  Negative for appetite change, chills, fatigue, fever and unexpected  weight change.  HENT:   Negative for hearing loss and voice change.   Eyes:  Negative for eye problems and icterus.  Respiratory:  Negative for chest tightness, cough and shortness of breath.   Cardiovascular:  Negative for chest pain and leg swelling.  Gastrointestinal:  Negative for abdominal distention and abdominal pain.  Endocrine: Negative for hot flashes.  Genitourinary:  Negative for difficulty urinating, dysuria and frequency.   Musculoskeletal:  Negative for arthralgias.  Skin:  Negative for itching and rash.  Neurological:  Negative for light-headedness and numbness.  Hematological:  Negative for adenopathy. Does not bruise/bleed easily.  Psychiatric/Behavioral:  Negative for confusion.    MEDICAL HISTORY:  Past Medical History:  Diagnosis Date   Chronic kidney disease    renal insufficiency   Diabetes mellitus without complication ([place])    Pt takes Insulin   Hypertension    Mental disorder    schizoaffective   Renal cell carcinoma ([place])    Wears dentures    full upper and lower    SURGICAL HISTORY: Past Surgical History:  Procedure Laterality Date   ANKLE FRACTURE SURGERY     CATARACT EXTRACTION W/PHACO Left 03/08/2021   Procedure: CATARACT EXTRACTION PHACO AND INTRAOCULAR LENS PLACEMENT ([facility]) LEFT DIABETIC 8.48 01:06.9;  Surgeon: Leandrew Koyanagi, MD;  Location: [facility];  Service: Ophthalmology;  Laterality: Left;  Diabetic - insulin   CATARACT EXTRACTION W/PHACO Right 03/22/2021   Procedure: CATARACT EXTRACTION PHACO AND INTRAOCULAR LENS PLACEMENT ([facility]) RIGHT DIABETIC 6.14 01:08.8;  Surgeon: Leandrew Koyanagi, MD;  Location: [facility];  Service: Ophthalmology;  Laterality: Right;  Diabetic - insulin   COLONOSCOPY WITH PROPOFOL N/A 04/15/2020   Procedure: COLONOSCOPY WITH PROPOFOL;  Surgeon: Virgel Manifold, MD;  Location: [facility];  Service: Endoscopy;  Laterality: N/A;   CRANIOTOMY Left 10/27/2013   Procedure: Craniotomy for  Aneurysm Clipping;  Surgeon: Consuella Lose, MD;  Location: [facility];  Service: Neurosurgery;  Laterality: Left;   IR RADIOLOGIST EVAL & MGMT  11/22/2020   IR RADIOLOGIST EVAL & MGMT  12/21/2020   KNEE SURGERY     due to fracture.    ROBOTIC ASSITED PARTIAL NEPHRECTOMY Left 08/10/2019   Procedure: XI ROBOTIC ASSITED LAPAROSCOPIC  PARTIAL NEPHRECTOMY;  Surgeon: Raynelle Bring, MD;  Location: [facility];  Service: Urology;  Laterality: Left;    SOCIAL HISTORY: Social History   Socioeconomic History   Marital status: Single    Spouse name: Not on file   Number of children: 0   Years of education: Not on file   Highest education level: Not on file  Occupational History   Not on file  Tobacco Use   Smoking status: Every Day    Packs/day: 0.50    Years: 48.00    Pack years: 24.00    Types: Cigarettes   Smokeless tobacco: Never  Vaping Use   Vaping Use: Never used  Substance and Sexual Activity   Alcohol use: Not Currently   Drug use: Never   Sexual activity: Not Currently  Other Topics Concern   Not on file  Social History Narrative   Not on file   Social Determinants of Health   Financial Resource Strain: Not on file  Food Insecurity: Not on file  Transportation Needs: Not on file  Physical Activity: Not on file  Stress: Not on file  Social  Connections: Not on file  Intimate Partner Violence: Not on file    FAMILY HISTORY: Family History  Problem Relation Age of Onset   Breast cancer Mother    Throat cancer Brother    Prostate cancer Neg Hx    Colon cancer Neg Hx    Pancreatic cancer Neg Hx     ALLERGIES:  has No Known Allergies.  MEDICATIONS:  Current Outpatient Medications  Medication Sig Dispense Refill   acetaminophen (TYLENOL) 650 MG CR tablet Take by mouth.     amLODipine-valsartan (EXFORGE) 10-320 MG tablet Take 1 tablet by mouth daily.     apixaban (ELIQUIS) 2.5 MG TABS tablet Take 1 tablet (2.5 mg total) by mouth 2 (two) times daily. 60 tablet 3    atorvastatin (LIPITOR) 10 MG tablet Take 1 tablet by mouth daily.     calcium carbonate (OS-CAL) 600 MG TABS tablet Take 2 tablets (1,200 mg total) by mouth 2 (two) times daily with a meal. 120 tablet 3   carvedilol (COREG) 25 MG tablet Take 25 mg by mouth 2 (two) times daily.     fenofibrate (TRICOR) 145 MG tablet Take 145 mg by mouth daily.     furosemide (LASIX) 20 MG tablet Take 0.5 tablets (10 mg total) by mouth daily. 30 tablet 0   Insulin Lispro Prot & Lispro (HUMALOG 75/25 MIX) (75-25) 100 UNIT/ML Kwikpen Inject 30 Units into the skin every morning.     lamoTRIgine (LAMICTAL) 100 MG tablet Take 100 mg by mouth 2 (two) times daily.     loperamide (IMODIUM A-D) 2 MG tablet Take 1 tablet (2 mg total) by mouth See admin instructions. Take '4mg'$  with the onset of diarrhea, then take '2mg'$  after every loose bowel movements. Maximum '16mg'$  per 24 hours. 90 tablet 1   minoxidil (LONITEN) 2.5 MG tablet Take by mouth daily.     olmesartan (BENICAR) 20 MG tablet Take 20 mg by mouth daily. 1 Tablet(s) By Mouth Daily     olmesartan (BENICAR) 40 MG tablet Take 40 mg by mouth daily.     omeprazole (PRILOSEC) 40 MG capsule Take 40 mg by mouth daily.     ondansetron (ZOFRAN) 8 MG tablet TAKE 1 TABLET BY MOUTH EVERY 8 HOURS AS NEEDED FOR NAUSEA OR VOMITING 20 tablet 2   tamsulosin (FLOMAX) 0.4 MG CAPS capsule Take 0.4 mg by mouth daily.      torsemide (DEMADEX) 20 MG tablet Take 20 mg by mouth daily.     ULTICARE PEN NEEDLES 29G X 12.7MM MISC      Valbenazine Tosylate (INGREZZA) 40 MG CAPS Take 40 mg by mouth daily.     ziprasidone (GEODON) 80 MG capsule Take 80 mg by mouth 2 (two) times daily.     cabozantinib (CABOMETYX) 40 MG tablet Take 1 tablet (40 mg total) by mouth daily. Take on an empty stomach, 1 hour before or 2 hours after meals. 30 tablet 1   DUREZOL 0.05 % EMUL Place 1 drop into the right eye 2 (two) times daily. (Patient not taking: Reported on 08/03/2021)     NOVOLOG MIX 70/30 FLEXPEN (70-30)  100 UNIT/ML FlexPen Inject 30 Units as directed daily. (Patient not taking: Reported on 08/03/2021)     No current facility-administered medications for this visit.     PHYSICAL EXAMINATION: ECOG PERFORMANCE STATUS: 1 - Symptomatic but completely ambulatory Vitals:   01/30/22 1016  BP: (!) 158/83  Pulse: (!) 55  Temp: (!) 96.9 F (36.1 C)  SpO2:  100%   Filed Weights   01/30/22 1016  Weight: 227 lb (103 kg)    Physical Exam Constitutional:      General: He is not in acute distress.    Comments: He ambulates independently  HENT:     Head: Normocephalic and atraumatic.  Eyes:     General: No scleral icterus. Cardiovascular:     Rate and Rhythm: Normal rate and regular rhythm.     Heart sounds: Normal heart sounds.  Pulmonary:     Effort: Pulmonary effort is normal. No respiratory distress.     Breath sounds: No wheezing.  Abdominal:     General: Bowel sounds are normal. There is no distension.     Palpations: Abdomen is soft.  Musculoskeletal:        General: No deformity. Normal range of motion.     Cervical back: Normal range of motion and neck supple.  Skin:    General: Skin is warm and dry.     Findings: No erythema or rash.  Neurological:     Mental Status: He is alert and oriented to person, place, and time. Mental status is at baseline.     Comments: Speech is slow  Psychiatric:        Mood and Affect: Mood normal.    LABORATORY DATA:  I have reviewed the data as listed Lab Results  Component Value Date   WBC 4.6 01/30/2022   HGB 11.6 (L) 01/30/2022   HCT 33.3 (L) 01/30/2022   MCV 97.7 01/30/2022   PLT 266 01/30/2022   Recent Labs    10/27/21 1151 11/27/21 1404 01/30/22 1006  NA 138 143 144  K 3.3* 3.7 3.6  CL 105 111 112*  CO2 '24 26 24  '$ GLUCOSE 110* 115* 116*  BUN 28* 31* 38*  CREATININE 3.12* 3.29* 3.59*  CALCIUM 7.4* 8.5* 7.8*  GFRNONAA 21* 19* 17*  PROT 6.5 5.9* 6.4*  ALBUMIN 3.4* 3.5 3.4*  AST '26 20 25  '$ ALT '19 18 21  '$ ALKPHOS 33*  41 33*  BILITOT 0.2* 0.4 0.3    Iron/TIBC/Ferritin/ %Sat No results found for: IRON, TIBC, FERRITIN, IRONPCTSAT    RADIOGRAPHIC STUDIES: I have personally reviewed the radiological images as listed and agreed with the findings in the report. MR Abdomen W Wo Contrast  Result Date: 11/09/2021 CLINICAL DATA:  Renal cell carcinoma history, follow-up. EXAM: MRI ABDOMEN WITHOUT AND WITH CONTRAST TECHNIQUE: Multiplanar multisequence MR imaging of the abdomen was performed both before and after the administration of intravenous contrast. CONTRAST:  55m GADAVIST GADOBUTROL 1 MMOL/ML IV SOLN COMPARISON:  MRI abdomen 10/10/2020, CT abdomen 09/14/2021 FINDINGS: Study is limited due to motion. Lower chest: No acute findings. Hepatobiliary: Liver is normal in size and contour. Several small hyperintense T2 signal hepatic cysts identified which appear unchanged. Gallstone. No wall thickening or pericholecystic fluid. No biliary ductal dilatation. Pancreas: Stable 6 mm hyperintense T2 signal cyst in the head of the pancreas. No pancreatic ductal dilatation. Spleen:  Within normal limits in size and appearance. Adrenals/Urinary Tract: Adrenal glands appear normal. Numerous renal cysts identified bilaterally measuring up to 3.8 cm in the lower pole on the right and 3.2 cm in the mid left kidney. 2.2 cm hypointense T2, hyperintense T1 signal hemorrhagic cyst in the mid left kidney. Postsurgical changes at the lower pole left kidney with no suspicious nodular enhancement appreciated. No hydronephrosis. Stomach/Bowel: No evidence of bowel obstruction. Vascular/Lymphatic: No pathologically enlarged lymph nodes identified. No abdominal aortic aneurysm demonstrated. Other:  No ascites. Musculoskeletal: No suspicious bony lesions identified. IMPRESSION: 1. Simple and hemorrhagic renal cysts. Surgical changes at the lower pole left kidney with no suspicious enhancement appreciated. 2. Stable 6 mm hyperintense T2 signal cystic  lesion in the head of the pancreas. Recommend follow-up MRI in 12 months. 3. Cholelithiasis. Electronically Signed   By: Ofilia Neas M.D.   On: 11/09/2021 13:51   US THYROID  Result Date: 12/07/2021 CLINICAL DATA:  Thyroid nodule EXAM: THYROID ULTRASOUND TECHNIQUE: Ultrasound examination of the thyroid gland and adjacent soft tissues was performed. COMPARISON:  09/14/2021 chest CT, 19 mm right inferior thyroid nodule FINDINGS: Parenchymal Echotexture: Moderately heterogenous Isthmus: 5 mm Right lobe: 6.7 x 3.6 x 2.1 cm Left lobe: 4.6 x 2.2 x 1.3 cm _________________________________________________________ Estimated total number of nodules >/= 1 cm: 1 Number of spongiform nodules >/=  2 cm not described below (TR1): 0 Number of mixed cystic and solid nodules >/= 1.5 cm not described below ([place]): 0 _________________________________________________________ Nodule # 1: Location: Right; Inferior Maximum size: 2.8 cm; Other 2 dimensions: 1.9 x 2.3 cm Composition: mixed cystic and solid (1) Echogenicity: isoechoic (1) Shape: not taller-than-wide (0) Margins: ill-defined (0) Echogenic foci: macrocalcifications (1) ACR TI-RADS total points: 3. ACR TI-RADS risk category: TR3 (3 points). ACR TI-RADS recommendations: **Given size (>/= 2.5 cm) and appearance, fine needle aspiration of this mildly suspicious nodule should be considered based on TI-RADS criteria. _________________________________________________________ Limited exam because of the patient's neck anatomy and low position of the thyroid. There are a few scattered additional subcentimeter cystic nodules noted, all measuring 5 mm or less. No hypervascularity. No regional adenopathy. IMPRESSION: 2.8 cm right inferior TR 3 nodule meets criteria for biopsy as above. This correlates with the chest CT finding. The above is in keeping with the ACR TI-RADS recommendations - J Am Coll Radiol 2017;14:587-595. Electronically Signed   By: Jerilynn Mages.  Shick M.D.   On:  12/07/2021 12:05        ASSESSMENT & PLAN:  1. Encounter for antineoplastic chemotherapy   2. Anemia due to stage 4 chronic kidney disease ([name])   3. Stage 4 chronic kidney disease ([place])   4. Cancer of kidney, left ([place])   5. Chemotherapy induced diarrhea   6. Primary hypertension    Cancer Staging  Cancer of kidney, left [facility]) Staging form: Kidney, AJCC 8th Edition - Clinical stage from 11/28/2020: Stage III (rcT1a, cN1, cM0) - Signed by Earlie Server, MD on 11/28/2020   Recurrent left papillary renal cell carcinoma, Patient declines surgery.  with nodal involvement- SBRT to nodal station in 11/2020 by Dr.Manning.  Labs reviewed and discussed with patient. Continue cabozantinib 40 mg daily. Repeat CT chest abdomen pelvis without contrast in June.  #Pancreatic cyst, 6 mm, stable.  Monitor.  # Acute pulmonary embolism. He has finished 6 months of Eliquis '5mg'$  BID, continue Eliquis 2.5 mg twice daily. #Thyroid nodule, 12/07/2021, thyroid ultrasound showed 2.8 cm right inferior TR 3 nodule meeting criteria for biopsy as above. We will obtain ultrasound-guided biopsy of thyroid nodule.  Hypertension, cabozantinib 40 mg daily follow-up with primary care provider. BP is well controlled.  Diarrhea, Recommend imodium PRN as instructed.  We discussed about the as needed instructions.  CKD stage IV, Encourage oral hydration. SPEP negative, light chain ratio was slightly elevated, -expected in CKD. Marland Kitchen  Avoid nephrotoxins.  Encourage oral hydration. Anemia, multifactorial, CKD, chemotherapy  Follow up in 2 months.    All questions were answered. The patient knows to call the  clinic with any problems questions or concerns.  cc Jodi Marble, MD    Earlie Server, MD, PhD  01/30/2022

## 2022-02-01 ENCOUNTER — Telehealth: Payer: Self-pay

## 2022-02-01 ENCOUNTER — Other Ambulatory Visit: Payer: Self-pay

## 2022-02-01 ENCOUNTER — Other Ambulatory Visit: Payer: Self-pay | Admitting: Oncology

## 2022-02-01 DIAGNOSIS — E041 Nontoxic single thyroid nodule: Secondary | ICD-10-CM

## 2022-02-01 NOTE — Telephone Encounter (Signed)
Called [facility] and informed them of [place] results of thyroid and Dr. Collie Siad recommendation to have Biopsy done to further evaluate. Advised that patient will need to be of Eliquis 2-3 days prior to procedure. Advised we would call them with appt details once scheduled. [facility] verbalized understanding.    Order for US guided biopsy place and form faxed to IR for scheduling.

## 2022-02-01 NOTE — Telephone Encounter (Signed)
Called to inform patient of results and Dr. Tasia Catchings recommendation. NO answer unable to leave message. Will attempt to contact later.

## 2022-02-01 NOTE — Telephone Encounter (Signed)
-----   Message from Earlie Server, MD sent at 01/30/2022  5:56 PM EDT ----- Please let patient know that patient's ultrasound thyroid showed a nodule that needs work-up. If he agrees, recommend ultrasound-guided thyroid nodule biopsy-next available.  Okay to hold off Eliquis for 2 to 3 days prior to the procedure and resume afterwards.

## 2022-02-05 NOTE — Telephone Encounter (Signed)
Called and spoke to [facility] to inform of patients Thyroid biopsy schedule for Thursday 6/15 @ 1pm patient to arrive at 12:30 pm. Also advised patient to stop Eliquis today. Sasha verbalized understanding.

## 2022-02-08 ENCOUNTER — Ambulatory Visit
Admission: RE | Admit: 2022-02-08 | Discharge: 2022-02-08 | Disposition: A | Discharge status: Home / Self Care | Payer: Medicare Other | Source: Ambulatory Visit | Attending: Oncology | Admitting: Oncology

## 2022-02-08 DIAGNOSIS — E041 Nontoxic single thyroid nodule: Secondary | ICD-10-CM | POA: Insufficient documentation

## 2022-02-08 NOTE — Procedures (Signed)
Successful US guided FNA of right inferior thyroid nodule No complications. See PACS for full report.    Narda Rutherford, AGNP-BC 02/08/2022, 1:45 PM

## 2022-02-09 LAB — CYTOLOGY - NON PAP

## 2022-02-12 ENCOUNTER — Other Ambulatory Visit (HOSPITAL_COMMUNITY): Payer: Self-pay

## 2022-02-13 ENCOUNTER — Other Ambulatory Visit: Payer: Self-pay | Admitting: Oncology

## 2022-02-14 ENCOUNTER — Other Ambulatory Visit: Payer: Self-pay | Admitting: Oncology

## 2022-02-19 ENCOUNTER — Ambulatory Visit
Admission: RE | Admit: 2022-02-19 | Discharge: 2022-02-19 | Disposition: A | Discharge status: Home / Self Care | Payer: Medicare Other | Source: Ambulatory Visit | Attending: Oncology | Admitting: Oncology

## 2022-02-19 ENCOUNTER — Other Ambulatory Visit (HOSPITAL_COMMUNITY): Payer: Self-pay

## 2022-02-19 DIAGNOSIS — C642 Malignant neoplasm of left kidney, except renal pelvis: Secondary | ICD-10-CM | POA: Diagnosis not present

## 2022-02-19 DIAGNOSIS — N281 Cyst of kidney, acquired: Secondary | ICD-10-CM | POA: Diagnosis not present

## 2022-02-19 DIAGNOSIS — K4090 Unilateral inguinal hernia, without obstruction or gangrene, not specified as recurrent: Secondary | ICD-10-CM | POA: Diagnosis not present

## 2022-02-19 DIAGNOSIS — J432 Centrilobular emphysema: Secondary | ICD-10-CM | POA: Diagnosis not present

## 2022-02-19 DIAGNOSIS — J929 Pleural plaque without asbestos: Secondary | ICD-10-CM | POA: Diagnosis not present

## 2022-02-19 DIAGNOSIS — J9811 Atelectasis: Secondary | ICD-10-CM | POA: Diagnosis not present

## 2022-02-19 DIAGNOSIS — I700 Atherosclerosis of aorta: Secondary | ICD-10-CM | POA: Diagnosis not present

## 2022-02-19 DIAGNOSIS — K8020 Calculus of gallbladder without cholecystitis without obstruction: Secondary | ICD-10-CM | POA: Diagnosis not present

## 2022-02-20 ENCOUNTER — Other Ambulatory Visit (HOSPITAL_COMMUNITY): Payer: Self-pay

## 2022-02-22 ENCOUNTER — Encounter: Payer: Self-pay | Admitting: Podiatry

## 2022-02-22 ENCOUNTER — Ambulatory Visit (INDEPENDENT_AMBULATORY_CARE_PROVIDER_SITE_OTHER): Payer: Medicare Other | Admitting: Podiatry

## 2022-02-22 DIAGNOSIS — L84 Corns and callosities: Secondary | ICD-10-CM | POA: Diagnosis not present

## 2022-02-22 DIAGNOSIS — E0843 Diabetes mellitus due to underlying condition with diabetic autonomic (poly)neuropathy: Secondary | ICD-10-CM | POA: Diagnosis not present

## 2022-02-22 DIAGNOSIS — B351 Tinea unguium: Secondary | ICD-10-CM

## 2022-02-22 DIAGNOSIS — M79674 Pain in right toe(s): Secondary | ICD-10-CM | POA: Diagnosis not present

## 2022-02-22 DIAGNOSIS — M216X2 Other acquired deformities of left foot: Secondary | ICD-10-CM

## 2022-02-22 DIAGNOSIS — M79675 Pain in left toe(s): Secondary | ICD-10-CM | POA: Diagnosis not present

## 2022-02-22 DIAGNOSIS — N183 Chronic kidney disease, stage 3 unspecified: Secondary | ICD-10-CM | POA: Diagnosis not present

## 2022-02-22 DIAGNOSIS — M2010 Hallux valgus (acquired), unspecified foot: Secondary | ICD-10-CM

## 2022-02-22 NOTE — Progress Notes (Signed)
This patient returns to my office for at risk foot care.  This patient requires this care by a professional since this patient will be at risk due to having type 2 diabetes and stage 3 kidney disease  This patient is unable to cut nails himself since the patient cannot reach his nails.These nails are painful walking and wearing shoes. Painful callus outside ball of left foot   This patient presents for at risk foot care today.  General Appearance  Alert, conversant and in no acute stress.  Vascular  Dorsalis pedis and posterior tibial  pulses are palpable  bilaterally.  Capillary return is within normal limits  bilaterally. Temperature is within normal limits  bilaterally.  Neurologic  Senn-Weinstein monofilament wire test absent bilaterally. Muscle power within normal limits bilaterally.  Nails Thick disfigured discolored nails with subungual debris  from second to fifth toes bilaterally. No evidence of bacterial infection or drainage bilaterally.  Orthopedic  No limitations of motion  feet .  No crepitus or effusions noted.  No bony pathology or digital deformities noted.  HAV  B/L.  Hammer toes  B/L.  Skin  normotropic skin with no porokeratosis noted bilaterally.  No signs of infections or ulcers noted.   Callus sub 5th met left foot. Callus dorsomedial aspect right foot.  Onychomycosis  Pain in right toes  Pain in left toes  Callus  B/L  Consent was obtained for treatment procedures.   Mechanical debridement of nails 1-5  bilaterally performed with a nail nipper.  Filed with dremel without incident.  Debride calluses with # 15 blade followed by dremel tool.     Return office visit   10 weeks               Told patient to return for periodic foot care and evaluation due to potential at risk complications.   Gardiner Barefoot DPM

## 2022-03-01 ENCOUNTER — Other Ambulatory Visit (HOSPITAL_COMMUNITY): Payer: Self-pay

## 2022-03-09 ENCOUNTER — Other Ambulatory Visit (HOSPITAL_COMMUNITY): Payer: Self-pay

## 2022-03-20 ENCOUNTER — Other Ambulatory Visit (HOSPITAL_COMMUNITY): Payer: Self-pay

## 2022-03-23 ENCOUNTER — Other Ambulatory Visit (HOSPITAL_COMMUNITY): Payer: Self-pay

## 2022-03-30 ENCOUNTER — Other Ambulatory Visit (HOSPITAL_COMMUNITY): Payer: Self-pay

## 2022-04-02 ENCOUNTER — Inpatient Hospital Stay: Payer: Medicare Other

## 2022-04-02 ENCOUNTER — Inpatient Hospital Stay: Payer: Medicare Other | Admitting: Oncology

## 2022-04-05 DIAGNOSIS — E119 Type 2 diabetes mellitus without complications: Secondary | ICD-10-CM | POA: Diagnosis not present

## 2022-04-05 DIAGNOSIS — N184 Chronic kidney disease, stage 4 (severe): Secondary | ICD-10-CM | POA: Diagnosis not present

## 2022-04-08 ENCOUNTER — Encounter: Payer: Self-pay | Admitting: Emergency Medicine

## 2022-04-08 ENCOUNTER — Emergency Department: Payer: Medicare Other

## 2022-04-08 ENCOUNTER — Inpatient Hospital Stay
Admission: EM | Admit: 2022-04-08 | Discharge: 2022-04-17 | DRG: 280 | Disposition: A | Discharge status: Home Health | Payer: Medicare Other | Attending: Obstetrics and Gynecology | Admitting: Obstetrics and Gynecology

## 2022-04-08 ENCOUNTER — Other Ambulatory Visit: Payer: Self-pay

## 2022-04-08 DIAGNOSIS — I248 Other forms of acute ischemic heart disease: Secondary | ICD-10-CM | POA: Diagnosis not present

## 2022-04-08 DIAGNOSIS — I129 Hypertensive chronic kidney disease with stage 1 through stage 4 chronic kidney disease, or unspecified chronic kidney disease: Secondary | ICD-10-CM | POA: Diagnosis present

## 2022-04-08 DIAGNOSIS — I447 Left bundle-branch block, unspecified: Secondary | ICD-10-CM | POA: Diagnosis not present

## 2022-04-08 DIAGNOSIS — I4901 Ventricular fibrillation: Secondary | ICD-10-CM | POA: Diagnosis not present

## 2022-04-08 DIAGNOSIS — R5381 Other malaise: Secondary | ICD-10-CM | POA: Diagnosis present

## 2022-04-08 DIAGNOSIS — I469 Cardiac arrest, cause unspecified: Principal | ICD-10-CM | POA: Diagnosis present

## 2022-04-08 DIAGNOSIS — F05 Delirium due to known physiological condition: Secondary | ICD-10-CM | POA: Diagnosis present

## 2022-04-08 DIAGNOSIS — J9811 Atelectasis: Secondary | ICD-10-CM | POA: Diagnosis not present

## 2022-04-08 DIAGNOSIS — I272 Pulmonary hypertension, unspecified: Secondary | ICD-10-CM | POA: Diagnosis not present

## 2022-04-08 DIAGNOSIS — Z794 Long term (current) use of insulin: Secondary | ICD-10-CM | POA: Diagnosis not present

## 2022-04-08 DIAGNOSIS — J9602 Acute respiratory failure with hypercapnia: Secondary | ICD-10-CM | POA: Diagnosis not present

## 2022-04-08 DIAGNOSIS — N170 Acute kidney failure with tubular necrosis: Secondary | ICD-10-CM | POA: Diagnosis present

## 2022-04-08 DIAGNOSIS — J439 Emphysema, unspecified: Secondary | ICD-10-CM | POA: Diagnosis not present

## 2022-04-08 DIAGNOSIS — Z905 Acquired absence of kidney: Secondary | ICD-10-CM

## 2022-04-08 DIAGNOSIS — C649 Malignant neoplasm of unspecified kidney, except renal pelvis: Secondary | ICD-10-CM | POA: Diagnosis present

## 2022-04-08 DIAGNOSIS — N189 Chronic kidney disease, unspecified: Secondary | ICD-10-CM

## 2022-04-08 DIAGNOSIS — F259 Schizoaffective disorder, unspecified: Secondary | ICD-10-CM | POA: Diagnosis present

## 2022-04-08 DIAGNOSIS — J690 Pneumonitis due to inhalation of food and vomit: Secondary | ICD-10-CM

## 2022-04-08 DIAGNOSIS — E8721 Acute metabolic acidosis: Secondary | ICD-10-CM | POA: Diagnosis not present

## 2022-04-08 DIAGNOSIS — M96A1 Fracture of sternum associated with chest compression and cardiopulmonary resuscitation: Secondary | ICD-10-CM | POA: Diagnosis present

## 2022-04-08 DIAGNOSIS — F209 Schizophrenia, unspecified: Secondary | ICD-10-CM

## 2022-04-08 DIAGNOSIS — N281 Cyst of kidney, acquired: Secondary | ICD-10-CM | POA: Diagnosis not present

## 2022-04-08 DIAGNOSIS — I10 Essential (primary) hypertension: Secondary | ICD-10-CM | POA: Diagnosis not present

## 2022-04-08 DIAGNOSIS — M96A3 Multiple fractures of ribs associated with chest compression and cardiopulmonary resuscitation: Secondary | ICD-10-CM | POA: Diagnosis not present

## 2022-04-08 DIAGNOSIS — D696 Thrombocytopenia, unspecified: Secondary | ICD-10-CM | POA: Diagnosis not present

## 2022-04-08 DIAGNOSIS — D631 Anemia in chronic kidney disease: Secondary | ICD-10-CM | POA: Diagnosis not present

## 2022-04-08 DIAGNOSIS — N179 Acute kidney failure, unspecified: Secondary | ICD-10-CM | POA: Diagnosis not present

## 2022-04-08 DIAGNOSIS — Z86711 Personal history of pulmonary embolism: Secondary | ICD-10-CM

## 2022-04-08 DIAGNOSIS — Z803 Family history of malignant neoplasm of breast: Secondary | ICD-10-CM

## 2022-04-08 DIAGNOSIS — I462 Cardiac arrest due to underlying cardiac condition: Secondary | ICD-10-CM | POA: Diagnosis present

## 2022-04-08 DIAGNOSIS — R1310 Dysphagia, unspecified: Secondary | ICD-10-CM | POA: Diagnosis present

## 2022-04-08 DIAGNOSIS — Z8673 Personal history of transient ischemic attack (TIA), and cerebral infarction without residual deficits: Secondary | ICD-10-CM

## 2022-04-08 DIAGNOSIS — E1122 Type 2 diabetes mellitus with diabetic chronic kidney disease: Secondary | ICD-10-CM | POA: Diagnosis not present

## 2022-04-08 DIAGNOSIS — Y92239 Unspecified place in hospital as the place of occurrence of the external cause: Secondary | ICD-10-CM | POA: Diagnosis not present

## 2022-04-08 DIAGNOSIS — R079 Chest pain, unspecified: Secondary | ICD-10-CM | POA: Diagnosis not present

## 2022-04-08 DIAGNOSIS — N184 Chronic kidney disease, stage 4 (severe): Secondary | ICD-10-CM | POA: Diagnosis not present

## 2022-04-08 DIAGNOSIS — R531 Weakness: Secondary | ICD-10-CM | POA: Diagnosis not present

## 2022-04-08 DIAGNOSIS — Z808 Family history of malignant neoplasm of other organs or systems: Secondary | ICD-10-CM

## 2022-04-08 DIAGNOSIS — E872 Acidosis, unspecified: Secondary | ICD-10-CM

## 2022-04-08 DIAGNOSIS — E041 Nontoxic single thyroid nodule: Secondary | ICD-10-CM | POA: Diagnosis not present

## 2022-04-08 DIAGNOSIS — G2401 Drug induced subacute dyskinesia: Secondary | ICD-10-CM | POA: Diagnosis present

## 2022-04-08 DIAGNOSIS — R0602 Shortness of breath: Secondary | ICD-10-CM | POA: Diagnosis not present

## 2022-04-08 DIAGNOSIS — G9341 Metabolic encephalopathy: Secondary | ICD-10-CM | POA: Diagnosis present

## 2022-04-08 DIAGNOSIS — R29810 Facial weakness: Secondary | ICD-10-CM | POA: Diagnosis present

## 2022-04-08 DIAGNOSIS — I472 Ventricular tachycardia, unspecified: Secondary | ICD-10-CM | POA: Diagnosis not present

## 2022-04-08 DIAGNOSIS — Z7901 Long term (current) use of anticoagulants: Secondary | ICD-10-CM

## 2022-04-08 DIAGNOSIS — I214 Non-ST elevation (NSTEMI) myocardial infarction: Secondary | ICD-10-CM

## 2022-04-08 DIAGNOSIS — J9601 Acute respiratory failure with hypoxia: Secondary | ICD-10-CM | POA: Diagnosis not present

## 2022-04-08 DIAGNOSIS — F17210 Nicotine dependence, cigarettes, uncomplicated: Secondary | ICD-10-CM | POA: Diagnosis present

## 2022-04-08 DIAGNOSIS — R0600 Dyspnea, unspecified: Secondary | ICD-10-CM | POA: Diagnosis not present

## 2022-04-08 DIAGNOSIS — J189 Pneumonia, unspecified organism: Secondary | ICD-10-CM

## 2022-04-08 DIAGNOSIS — T508X5A Adverse effect of diagnostic agents, initial encounter: Secondary | ICD-10-CM | POA: Diagnosis not present

## 2022-04-08 DIAGNOSIS — Z7982 Long term (current) use of aspirin: Secondary | ICD-10-CM

## 2022-04-08 DIAGNOSIS — I499 Cardiac arrhythmia, unspecified: Secondary | ICD-10-CM | POA: Diagnosis not present

## 2022-04-08 DIAGNOSIS — N1411 Contrast-induced nephropathy: Secondary | ICD-10-CM | POA: Diagnosis not present

## 2022-04-08 DIAGNOSIS — Z85528 Personal history of other malignant neoplasm of kidney: Secondary | ICD-10-CM

## 2022-04-08 DIAGNOSIS — Z743 Need for continuous supervision: Secondary | ICD-10-CM | POA: Diagnosis not present

## 2022-04-08 DIAGNOSIS — M47812 Spondylosis without myelopathy or radiculopathy, cervical region: Secondary | ICD-10-CM | POA: Diagnosis not present

## 2022-04-08 DIAGNOSIS — E1169 Type 2 diabetes mellitus with other specified complication: Secondary | ICD-10-CM | POA: Diagnosis not present

## 2022-04-08 DIAGNOSIS — E8729 Other acidosis: Secondary | ICD-10-CM | POA: Diagnosis not present

## 2022-04-08 DIAGNOSIS — Z9221 Personal history of antineoplastic chemotherapy: Secondary | ICD-10-CM

## 2022-04-08 DIAGNOSIS — I700 Atherosclerosis of aorta: Secondary | ICD-10-CM | POA: Diagnosis not present

## 2022-04-08 DIAGNOSIS — R404 Transient alteration of awareness: Secondary | ICD-10-CM | POA: Diagnosis not present

## 2022-04-08 DIAGNOSIS — C642 Malignant neoplasm of left kidney, except renal pelvis: Secondary | ICD-10-CM | POA: Diagnosis present

## 2022-04-08 LAB — URINALYSIS, ROUTINE W REFLEX MICROSCOPIC
Bilirubin Urine: NEGATIVE
Glucose, UA: 50 mg/dL — AB
Ketones, ur: NEGATIVE mg/dL
Leukocytes,Ua: NEGATIVE
Nitrite: NEGATIVE
Protein, ur: 100 mg/dL — AB
Specific Gravity, Urine: 1.01 (ref 1.005–1.030)
pH: 5 (ref 5.0–8.0)

## 2022-04-08 LAB — CBC WITH DIFFERENTIAL/PLATELET
Abs Immature Granulocytes: 1.5 10*3/uL — ABNORMAL HIGH (ref 0.00–0.07)
Basophils Absolute: 0.1 10*3/uL (ref 0.0–0.1)
Basophils Relative: 1 %
Eosinophils Absolute: 0 10*3/uL (ref 0.0–0.5)
Eosinophils Relative: 0 %
HCT: 35 % — ABNORMAL LOW (ref 39.0–52.0)
Hemoglobin: 11.6 g/dL — ABNORMAL LOW (ref 13.0–17.0)
Immature Granulocytes: 15 %
Lymphocytes Relative: 26 %
Lymphs Abs: 2.7 10*3/uL (ref 0.7–4.0)
MCH: 33 pg (ref 26.0–34.0)
MCHC: 33.1 g/dL (ref 30.0–36.0)
MCV: 99.7 fL (ref 80.0–100.0)
Monocytes Absolute: 0.4 10*3/uL (ref 0.1–1.0)
Monocytes Relative: 4 %
Neutro Abs: 5.6 10*3/uL (ref 1.7–7.7)
Neutrophils Relative %: 54 %
Platelets: 219 10*3/uL (ref 150–400)
RBC: 3.51 MIL/uL — ABNORMAL LOW (ref 4.22–5.81)
RDW: 17.9 % — ABNORMAL HIGH (ref 11.5–15.5)
Smear Review: NORMAL
WBC: 10.3 10*3/uL (ref 4.0–10.5)
nRBC: 2.2 % — ABNORMAL HIGH (ref 0.0–0.2)

## 2022-04-08 LAB — COMPREHENSIVE METABOLIC PANEL
ALT: 37 U/L (ref 0–44)
AST: 60 U/L — ABNORMAL HIGH (ref 15–41)
Albumin: 3 g/dL — ABNORMAL LOW (ref 3.5–5.0)
Alkaline Phosphatase: 55 U/L (ref 38–126)
Anion gap: 10 (ref 5–15)
BUN: 27 mg/dL — ABNORMAL HIGH (ref 8–23)
CO2: 21 mmol/L — ABNORMAL LOW (ref 22–32)
Calcium: 9.3 mg/dL (ref 8.9–10.3)
Chloride: 112 mmol/L — ABNORMAL HIGH (ref 98–111)
Creatinine, Ser: 3.94 mg/dL — ABNORMAL HIGH (ref 0.61–1.24)
GFR, Estimated: 15 mL/min — ABNORMAL LOW (ref >60)
Glucose, Bld: 145 mg/dL — ABNORMAL HIGH (ref 70–99)
Potassium: 3.8 mmol/L (ref 3.5–5.1)
Sodium: 143 mmol/L (ref 135–145)
Total Bilirubin: 0.8 mg/dL (ref 0.3–1.2)
Total Protein: 6.1 g/dL — ABNORMAL LOW (ref 6.5–8.1)

## 2022-04-08 LAB — PROTIME-INR
INR: 1.4 — ABNORMAL HIGH (ref 0.8–1.2)
Prothrombin Time: 17 seconds — ABNORMAL HIGH (ref 11.4–15.2)

## 2022-04-08 LAB — LACTIC ACID, PLASMA
Lactic Acid, Venous: 5.6 mmol/L (ref 0.5–1.9)
Lactic Acid, Venous: 2.5 mmol/L (ref 0.5–1.9)
Lactic Acid, Venous: 2.9 mmol/L (ref 0.5–1.9)
Lactic Acid, Venous: 1.5 mmol/L (ref 0.5–1.9)

## 2022-04-08 LAB — BLOOD GAS, ARTERIAL
Acid-base deficit: 4.2 mmol/L — ABNORMAL HIGH (ref 0.0–2.0)
Bicarbonate: 21 mmol/L (ref 20.0–28.0)
FIO2: 50 %
MECHVT: 550 mL
Mechanical Rate: 20
O2 Saturation: 87.9 %
PEEP: 8 cmH2O
Patient temperature: 37
RATE: 20 resp/min
pCO2 arterial: 38 mmHg (ref 32–48)
pH, Arterial: 7.35 (ref 7.35–7.45)
pO2, Arterial: 55 mmHg — ABNORMAL LOW (ref 83–108)

## 2022-04-08 LAB — PROCALCITONIN: Procalcitonin: <0.1 ng/mL

## 2022-04-08 LAB — URINE DRUG SCREEN, QUALITATIVE (ARMC ONLY)
Amphetamines, Ur Screen: NOT DETECTED
Barbiturates, Ur Screen: NOT DETECTED
Benzodiazepine, Ur Scrn: NOT DETECTED
Cannabinoid 50 Ng, Ur ~~LOC~~: NOT DETECTED
Cocaine Metabolite,Ur ~~LOC~~: NOT DETECTED
MDMA (Ecstasy)Ur Screen: NOT DETECTED
Methadone Scn, Ur: NOT DETECTED
Opiate, Ur Screen: NOT DETECTED
Phencyclidine (PCP) Ur S: NOT DETECTED
Tricyclic, Ur Screen: NOT DETECTED

## 2022-04-08 LAB — GLUCOSE, CAPILLARY
Glucose-Capillary: 104 mg/dL — ABNORMAL HIGH (ref 70–99)
Glucose-Capillary: 108 mg/dL — ABNORMAL HIGH (ref 70–99)
Glucose-Capillary: 110 mg/dL — ABNORMAL HIGH (ref 70–99)

## 2022-04-08 LAB — TROPONIN I (HIGH SENSITIVITY)
Troponin I (High Sensitivity): 95 ng/L — ABNORMAL HIGH (ref <18)
Troponin I (High Sensitivity): 535 ng/L (ref <18)
Troponin I (High Sensitivity): 913 ng/L (ref <18)
Troponin I (High Sensitivity): 1131 ng/L (ref <18)

## 2022-04-08 LAB — APTT: aPTT: 41 seconds — ABNORMAL HIGH (ref 24–36)

## 2022-04-08 LAB — CBG MONITORING, ED: Glucose-Capillary: 116 mg/dL — ABNORMAL HIGH (ref 70–99)

## 2022-04-08 LAB — MRSA NEXT GEN BY PCR, NASAL: MRSA by PCR Next Gen: NOT DETECTED

## 2022-04-08 LAB — PHOSPHORUS: Phosphorus: 5.8 mg/dL — ABNORMAL HIGH (ref 2.5–4.6)

## 2022-04-08 LAB — HEPARIN LEVEL (UNFRACTIONATED): Heparin Unfractionated: 0.93 IU/mL — ABNORMAL HIGH (ref 0.30–0.70)

## 2022-04-08 LAB — MAGNESIUM: Magnesium: 1.7 mg/dL (ref 1.7–2.4)

## 2022-04-08 LAB — BRAIN NATRIURETIC PEPTIDE: B Natriuretic Peptide: 125.4 pg/mL — ABNORMAL HIGH (ref 0.0–100.0)

## 2022-04-08 MED ORDER — EPINEPHRINE 1 MG/10ML IJ SOSY
PREFILLED_SYRINGE | INTRAMUSCULAR | Status: AC | PRN
Start: 2022-04-08 — End: 2022-04-08
  Administered 2022-04-08 (×4): 1 mg via INTRAVENOUS

## 2022-04-08 MED ORDER — PIPERACILLIN-TAZOBACTAM 3.375 G IVPB
3.3750 g | Freq: Three times a day (TID) | INTRAVENOUS | Status: AC
  Administered 2022-04-08 – 2022-04-12 (×13): 3.375 g via INTRAVENOUS
  Filled 2022-04-08 (×13): qty 50

## 2022-04-08 MED ORDER — INSULIN ASPART 100 UNIT/ML IJ SOLN
0.0000 [IU] | INTRAMUSCULAR | Status: DC
  Administered 2022-04-09: 2 [IU] via SUBCUTANEOUS
  Administered 2022-04-09: 1 [IU] via SUBCUTANEOUS
  Administered 2022-04-09: 2 [IU] via SUBCUTANEOUS
  Administered 2022-04-10: 3 [IU] via SUBCUTANEOUS
  Administered 2022-04-10 (×3): 2 [IU] via SUBCUTANEOUS
  Administered 2022-04-10: 3 [IU] via SUBCUTANEOUS
  Administered 2022-04-10 – 2022-04-11 (×3): 2 [IU] via SUBCUTANEOUS
  Filled 2022-04-08 (×11): qty 1

## 2022-04-08 MED ORDER — POLYETHYLENE GLYCOL 3350 17 G PO PACK: 17.0000 g | PACK | Freq: Every day | ORAL | Status: DC

## 2022-04-08 MED ORDER — LACTATED RINGERS IV BOLUS
500.0000 mL | Freq: Once | INTRAVENOUS | Status: AC
  Administered 2022-04-08: 500 mL via INTRAVENOUS

## 2022-04-08 MED ORDER — LACTATED RINGERS IV BOLUS
1000.0000 mL | Freq: Once | INTRAVENOUS | Status: AC
  Administered 2022-04-08: 1000 mL via INTRAVENOUS

## 2022-04-08 MED ORDER — IPRATROPIUM-ALBUTEROL 0.5-2.5 (3) MG/3ML IN SOLN
3.0000 mL | Freq: Four times a day (QID) | RESPIRATORY_TRACT | Status: DC
  Administered 2022-04-08 – 2022-04-09 (×3): 3 mL via RESPIRATORY_TRACT
  Filled 2022-04-08 (×3): qty 3

## 2022-04-08 MED ORDER — SODIUM CHLORIDE 0.9 % IV SOLN: 250.0000 mL | INTRAVENOUS | Status: DC

## 2022-04-08 MED ORDER — SODIUM BICARBONATE 8.4 % IV SOLN
INTRAVENOUS | Status: DC | PRN
  Administered 2022-04-08: 50 meq via INTRAVENOUS

## 2022-04-08 MED ORDER — INSULIN ASPART 100 UNIT/ML IV SOLN
5.0000 [IU] | Freq: Once | INTRAVENOUS | Status: DC
Start: 2022-04-08 — End: 2022-04-08

## 2022-04-08 MED ORDER — HEPARIN (PORCINE) 25000 UT/250ML-% IV SOLN
1300.0000 [IU]/h | INTRAVENOUS | Status: DC
  Administered 2022-04-08: 1300 [IU]/h via INTRAVENOUS
  Filled 2022-04-08: qty 250

## 2022-04-08 MED ORDER — DOCUSATE SODIUM 100 MG PO CAPS
100.0000 mg | ORAL_CAPSULE | Freq: Two times a day (BID) | ORAL | Status: DC | PRN
  Filled 2022-04-08: qty 1

## 2022-04-08 MED ORDER — LACTATED RINGERS IV SOLN: INTRAVENOUS | Status: DC

## 2022-04-08 MED ORDER — CHLORHEXIDINE GLUCONATE CLOTH 2 % EX PADS
6.0000 | MEDICATED_PAD | Freq: Every day | CUTANEOUS | Status: DC
  Administered 2022-04-10 – 2022-04-13 (×5): 6 via TOPICAL

## 2022-04-08 MED ORDER — SODIUM BICARBONATE 8.4 % IV SOLN
25.0000 meq | Freq: Once | INTRAVENOUS | Status: DC
  Filled 2022-04-08: qty 50

## 2022-04-08 MED ORDER — ORAL CARE MOUTH RINSE
15.0000 mL | OROMUCOSAL | Status: DC
Start: 2022-04-08 — End: 2022-04-09
  Administered 2022-04-08 – 2022-04-09 (×8): 15 mL via OROMUCOSAL

## 2022-04-08 MED ORDER — POLYETHYLENE GLYCOL 3350 17 G PO PACK: 17.0000 g | PACK | Freq: Every day | ORAL | Status: DC | PRN

## 2022-04-08 MED ORDER — PROPOFOL 1000 MG/100ML IV EMUL
5.0000 ug/kg/min | INTRAVENOUS | Status: DC
  Administered 2022-04-08: 30 ug/kg/min via INTRAVENOUS
  Administered 2022-04-08: 20 ug/kg/min via INTRAVENOUS
  Administered 2022-04-09: 40 ug/kg/min via INTRAVENOUS
  Administered 2022-04-09: 30 ug/kg/min via INTRAVENOUS
  Filled 2022-04-08 (×4): qty 100

## 2022-04-08 MED ORDER — LACTATED RINGERS IV BOLUS: 1000.0000 mL | Freq: Once | INTRAVENOUS | Status: DC

## 2022-04-08 MED ORDER — HEPARIN SODIUM (PORCINE) 5000 UNIT/ML IJ SOLN
5000.0000 [IU] | Freq: Three times a day (TID) | INTRAMUSCULAR | Status: DC
Start: 2022-04-08 — End: 2022-04-08
  Administered 2022-04-08: 5000 [IU] via SUBCUTANEOUS
  Filled 2022-04-08: qty 1

## 2022-04-08 MED ORDER — FENTANYL CITRATE PF 50 MCG/ML IJ SOSY: 25.0000 ug | PREFILLED_SYRINGE | INTRAMUSCULAR | Status: DC | PRN

## 2022-04-08 MED ORDER — MAGNESIUM SULFATE 2 GM/50ML IV SOLN
2.0000 g | Freq: Once | INTRAVENOUS | Status: AC
  Administered 2022-04-08: 2 g via INTRAVENOUS
  Filled 2022-04-08: qty 50

## 2022-04-08 MED ORDER — DOCUSATE SODIUM 50 MG/5ML PO LIQD
100.0000 mg | Freq: Two times a day (BID) | ORAL | Status: DC
Start: 2022-04-08 — End: 2022-04-09

## 2022-04-08 MED ORDER — VANCOMYCIN HCL 2000 MG/400ML IV SOLN
2000.0000 mg | Freq: Once | INTRAVENOUS | Status: AC
  Administered 2022-04-08: 2000 mg via INTRAVENOUS
  Filled 2022-04-08: qty 400

## 2022-04-08 MED ORDER — POTASSIUM CHLORIDE 10 MEQ/100ML IV SOLN
10.0000 meq | Freq: Once | INTRAVENOUS | Status: AC
  Administered 2022-04-08: 10 meq via INTRAVENOUS
  Filled 2022-04-08: qty 100

## 2022-04-08 MED ORDER — ORAL CARE MOUTH RINSE: 15.0000 mL | OROMUCOSAL | Status: DC | PRN

## 2022-04-08 MED ORDER — SODIUM CHLORIDE 0.9 % IV SOLN
500.0000 mg | INTRAVENOUS | Status: DC
  Administered 2022-04-08: 500 mg via INTRAVENOUS
  Filled 2022-04-08: qty 5
  Filled 2022-04-08: qty 500

## 2022-04-08 MED ORDER — SODIUM CHLORIDE 0.9 % IV SOLN
2.0000 g | Freq: Once | INTRAVENOUS | Status: AC
  Administered 2022-04-08: 2 g via INTRAVENOUS
  Filled 2022-04-08: qty 12.5

## 2022-04-08 MED ORDER — PANTOPRAZOLE SODIUM 40 MG IV SOLR
40.0000 mg | Freq: Every day | INTRAVENOUS | Status: DC
  Administered 2022-04-08: 40 mg via INTRAVENOUS
  Filled 2022-04-08: qty 10

## 2022-04-08 MED ORDER — IOHEXOL 350 MG/ML SOLN
75.0000 mL | Freq: Once | INTRAVENOUS | Status: AC | PRN
  Administered 2022-04-08: 75 mL via INTRAVENOUS

## 2022-04-08 MED ORDER — FENTANYL CITRATE PF 50 MCG/ML IJ SOSY
25.0000 ug | PREFILLED_SYRINGE | INTRAMUSCULAR | Status: DC | PRN
  Administered 2022-04-09 (×2): 50 ug via INTRAVENOUS
  Administered 2022-04-09: 100 ug via INTRAVENOUS
  Filled 2022-04-08: qty 2
  Filled 2022-04-08 (×2): qty 1

## 2022-04-08 MED ORDER — CALCIUM GLUCONATE-NACL 1-0.675 GM/50ML-% IV SOLN
1.0000 g | Freq: Once | INTRAVENOUS | Status: DC
  Filled 2022-04-08: qty 50

## 2022-04-08 MED ORDER — SODIUM CHLORIDE 0.9 % IV SOLN
INTRAVENOUS | Status: AC | PRN
Start: 2022-04-08 — End: 2022-04-08
  Administered 2022-04-08: 1000 mL via INTRAVENOUS

## 2022-04-08 MED ORDER — MIDAZOLAM HCL 2 MG/2ML IJ SOLN: 1.0000 mg | INTRAMUSCULAR | Status: DC | PRN

## 2022-04-08 MED ORDER — PROPOFOL 1000 MG/100ML IV EMUL
INTRAVENOUS | Status: AC
  Administered 2022-04-08: 5 ug/kg/min
  Filled 2022-04-08: qty 100

## 2022-04-08 MED ORDER — NOREPINEPHRINE 4 MG/250ML-% IV SOLN: 2.0000 ug/min | INTRAVENOUS | Status: DC

## 2022-04-08 MED ORDER — DEXTROSE 50 % IV SOLN
25.0000 g | Freq: Once | INTRAVENOUS | Status: DC
  Filled 2022-04-08: qty 50

## 2022-04-08 MED ORDER — NOREPINEPHRINE 4 MG/250ML-% IV SOLN
0.0000 ug/min | INTRAVENOUS | Status: DC
  Administered 2022-04-08: 2 ug/min via INTRAVENOUS

## 2022-04-08 MED ORDER — CALCIUM CHLORIDE 10 % IV SOLN
INTRAVENOUS | Status: DC | PRN
  Administered 2022-04-08: 1 g via INTRAVENOUS

## 2022-04-08 NOTE — Code Documentation (Addendum)
LUCAS device placed on pt at this time. CPR continues at this time.

## 2022-04-08 NOTE — Code Documentation (Addendum)
CPR paused for pulse check, pt has palpable pulse present at this time. Pt in wide complex rhythm at this time.

## 2022-04-08 NOTE — Code Documentation (Addendum)
CPR paused for pulse check, no palpable pulse present at this time. pt asystole on the monitor, CPR resumed

## 2022-04-08 NOTE — Code Documentation (Addendum)
Pt now in asystole on the monitor, CPR resumed at this time

## 2022-04-08 NOTE — Code Documentation (Addendum)
Pt intubated by Dr. Starleen Blue with 8.0 ETT 22 at the teeth. Positive color change noted, breath sounds equal bilaterally

## 2022-04-08 NOTE — ED Notes (Signed)
Pt attempting to reach for ET tube with LUE.

## 2022-04-08 NOTE — ED Notes (Signed)
Advised nurse that patient has ready bed 

## 2022-04-08 NOTE — ED Notes (Signed)
Pt moves LUE, RLE, LLE spon. LUE reaching towards ET tube

## 2022-04-08 NOTE — ED Provider Notes (Signed)
[facility] Provider Note    Event Date/Time   First MD Initiated Contact with Patient 04/08/22 1058     (approximate)   History   Cardiac Arrest   HPI  Riley Stuart is a 71 y.o. male  with pmh DM, HTN, RCC, history of ruptured cerebral aneurysm with subarachnoid hemorrhage presents in cardiac arrest.  Per EMS patient was at his facility outside when he was witnessed to collapse.  When EMS arrived he initially had thready pulses but then lost his pulse.  Initial rhythm was asystole.  He received 4 rounds of epinephrine and they got ROSC at about 12 minutes and.  Attempted to intubate the patient with a King airway but patient had gag reflex the entire time she was unable to be intubated.  After ROSC was obtained he quickly lost his pulse again.  Was in asystole and then V-fib received 300 mg of amiodarone and was shocked x1.     Past Medical History:  Diagnosis Date   Chronic kidney disease    renal insufficiency   Diabetes mellitus without complication ([place])    Pt takes Insulin   Hypertension    Mental disorder    schizoaffective   Renal cell carcinoma ([place])    Wears dentures    full upper and lower    Patient Active Problem List   Diagnosis Date Noted   Callus 08/14/2021   Plantar flexed metatarsal bone of left foot 08/14/2021   Renal cell carcinoma ([place]) 05/18/2021   Pulmonary infarct ([place]) 05/12/2021   Anemia due to stage 4 chronic kidney disease ([place]) 05/12/2021   Anemia in chronic kidney disease 02/08/2021   Encounter for antineoplastic chemotherapy 01/18/2021   Stage 4 chronic kidney disease ([place]) 01/18/2021   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[place]) 01/11/2021   Pain due to onychomycosis of toenails of both feet 11/21/2020   Goals of care, counseling/discussion    Polyp of colon    Cancer of kidney, left ([place]) 08/10/2019   Benign hypertensive kidney disease with chronic kidney disease 06/08/2019   Proteinuria  06/08/2019   Renal mass 06/08/2019   Stage 3 chronic kidney disease ([place]) 06/08/2019   Type 2 diabetes mellitus with diabetic chronic kidney disease ([place]) 06/08/2019   Low back pain 01/05/2014   Leg length discrepancy left 01/05/2014   Cerebral aneurysm rupture ([place]) 11/06/2013   SAH (subarachnoid hemorrhage) ([place]) 10/26/2013     Physical Exam  Triage Vital Signs: ED Triage Vitals [04/08/22 1042]  Enc Vitals Group     BP (!) 161/13     Pulse Rate 100     Resp (!) 23     Temp      Temp src      SpO2 100 %     Weight      Height      Head Circumference      Peak Flow      Pain Score      Pain Loc      Pain Edu?      Excl. in [place]?     Most recent vital signs: Vitals:   04/08/22 1300 04/08/22 1315  BP: (!) 127/100 130/87  Pulse: 72 73  Resp: 19 (!) 23  Temp:    SpO2: 93% 94%     General: Patient is critically ill-appearing CV:  Good peripheral perfusion.  No peripheral edema Resp:  Normal effort.  Patient is spontaneously breathing, copious secretions Abd:  No  distention.  Neuro:             Eyes are open, pupils are pinpoint, no purposeful motor response, GCS 6 Other:  Left-sided facial droop   ED Results / Procedures / Treatments  Labs (all labs ordered are listed, but only abnormal results are displayed) Labs Reviewed  COMPREHENSIVE METABOLIC PANEL - Abnormal; Notable for the following components:      Result Value   Chloride 112 (*)    CO2 21 (*)    Glucose, Bld 145 (*)    BUN 27 (*)    Creatinine, Ser 3.94 (*)    Total Protein 6.1 (*)    Albumin 3.0 (*)    AST 60 (*)    GFR, Estimated 15 (*)    All other components within normal limits  CBC WITH DIFFERENTIAL/PLATELET - Abnormal; Notable for the following components:   RBC 3.51 (*)    Hemoglobin 11.6 (*)    HCT 35.0 (*)    RDW 17.9 (*)    nRBC 2.2 (*)    Abs Immature Granulocytes 1.50 (*)    All other components within normal limits  LACTIC ACID, PLASMA - Abnormal; Notable for the following  components:   Lactic Acid, Venous 5.6 (*)    All other components within normal limits  BLOOD GAS, ARTERIAL - Abnormal; Notable for the following components:   pH, Arterial 7.1 (*)    pCO2 arterial 56 (*)    pO2, Arterial 69 (*)    Bicarbonate 17.4 (*)    Acid-base deficit 12.6 (*)    All other components within normal limits  PHOSPHORUS - Abnormal; Notable for the following components:   Phosphorus 5.8 (*)    All other components within normal limits  PROTIME-INR - Abnormal; Notable for the following components:   Prothrombin Time 17.0 (*)    INR 1.4 (*)    All other components within normal limits  APTT - Abnormal; Notable for the following components:   aPTT 41 (*)    All other components within normal limits  CBG MONITORING, ED - Abnormal; Notable for the following components:   Glucose-Capillary 116 (*)    All other components within normal limits  TROPONIN I (HIGH SENSITIVITY) - Abnormal; Notable for the following components:   Troponin I (High Sensitivity) 95 (*)    All other components within normal limits  MAGNESIUM  LACTIC ACID, PLASMA  URINALYSIS, ROUTINE W REFLEX MICROSCOPIC  URINE DRUG SCREEN, QUALITATIVE ([facility] ONLY)  TROPONIN I (HIGH SENSITIVITY)     EKG  EKG interpreted by myself shows sinus bradycardia with a left bundle branch block very wide QRS ST depression in lead II and V3   RADIOLOGY Chest x-ray interpreted by myself shows ET tube in appropriate position, pulmonary edema worse on the right compared to left   PROCEDURES:  Critical Care performed: Yes, see critical care procedure note(s)  .1-3 Lead EKG Interpretation  Performed by: Rada Hay, MD Authorized by: Rada Hay, MD     Interpretation: abnormal     ECG rate assessment: bradycardic     Rhythm: sinus bradycardia     Ectopy: none     Conduction: normal   .Critical Care  Performed by: Rada Hay, MD Authorized by: Rada Hay, MD   Critical care  provider statement:    Critical care time (minutes):  75   Critical care was time spent personally by me on the following activities:  Development of treatment plan with  patient or surrogate, discussions with consultants, evaluation of patient's response to treatment, examination of patient, ordering and review of laboratory studies, ordering and review of radiographic studies, ordering and performing treatments and interventions, pulse oximetry, re-evaluation of patient's condition and review of old charts   The patient is on the cardiac monitor to evaluate for evidence of arrhythmia and/or significant heart rate changes.   MEDICATIONS ORDERED IN ED: Medications  EPINEPHrine (ADRENALIN) 1 MG/10ML injection (1 mg Intravenous Given 04/08/22 1046)  0.9 %  sodium chloride infusion (1,000 mLs Intravenous New Bag/Given 04/08/22 1038)  sodium bicarbonate injection 25 mEq (25 mEq Intravenous Not Given 04/08/22 1333)  norepinephrine (LEVOPHED) '4mg'$  in 224m (0.016 mg/mL) premix infusion (0 mcg/min Intravenous Paused 04/08/22 1317)  lactated ringers bolus 1,000 mL (has no administration in time range)  ceFEPIme (MAXIPIME) 2 g in sodium chloride 0.9 % 100 mL IVPB (has no administration in time range)  vancomycin (VANCOREADY) IVPB 2000 mg/400 mL (has no administration in time range)  propofol (DIPRIVAN) 1000 MG/100ML infusion (30 mcg/kg/min  Rate/Dose Change 04/08/22 1315)  iohexol (OMNIPAQUE) 350 MG/ML injection 75 mL (75 mLs Intravenous Contrast Given 04/08/22 1254)     IMPRESSION / MDM / ASSESSMENT AND PLAN / ED COURSE  I reviewed the triage vital signs and the nursing notes.                              Patient's presentation is most consistent with acute presentation with potential threat to life or bodily function.  Differential diagnosis includes, but is not limited to, hyperkalemia secondary to end-stage renal disease, massive intracranial hemorrhage, CVA, acute coronary syndrome The patient is a  71year old male past medical history of RCC on chemotherapy, schizoaffective disorder,, hypertension, CKD who presents in cardiac arrest.  This was a witnessed arrest by EMS.  Was witnessed to collapse by his group home EMS arrived initially had a pulse but then lost his pulse.  Was asystole ROSC was received at about 12 minutes shortly he lost pulses again did get defib once for V-fib and received 4 rounds of epinephrine and 300 mg of amiodarone in the field.  On arrival to the ED patient placed on our pads, and initial pulse check noted to be in asystole another milligram of epinephrine given.  He was intubated with a 7.5 ET tube, copious secretions noted at the time of intubation.  Several rounds of CPR completed.  Was noted to be in V. tach at one-point, defib discharged but then when shock was about to be delivered he was then noted to be in asystole so shock not delivered.  ROSC achieved x1 and then lost and then achieved again.  Received total of 4 mg of epinephrine and 1 calcium 1 bicarb in the ED.  Blood pressure immediately after ROSC is elevated [id number] his EKG shows a left bundle branch block that is quite wide did have left bundle branch block before sinus bradycardia.  Did bradycardia down to the 40s so was given a milligram of atropine.  Patient then noted to be kind of gagging on the endotracheal tube so low-dose propofol started.  Chest x-ray showing pulmonary edema ET tube in appropriate position.  Suspect metabolic versus massive intracranial event given patient does have a mild left facial droop that is apparently new.   Patient's labs are notable for normal potassium of 3.8, creatinine near baseline, he does have a lactic acidosis to 5.6.  Bicarb 21, no leukocytosis, hemoglobin 11.6.  Troponin is 95.  Chest x-ray showing pulmonary edema and rib fractures.  Noncon CT of the chest showing consolidation lower lung fields R>L.  Patient did start to drop his pressure so started on low-dose  Levophed.  Propofol was just at 5.  On my assessment patient does continue to have left facial droop is not responding to pain in any of his extremities.  I performed a bedside ultrasound cardiac function overall does not look terrible.  On apical four-chamber view does look like the right heart is somewhat dilated.  CT of the head did not show any obvious brain bleed would not expect a CVA to cause cardiac arrest not sure why he has facial droop but feel that he can likely get an MRI as an inpatient avoid further contrast load.  However do feel that he needs to be evaluated emergently for pulmonary embolism as this would change management.   CTA does not show any evidence of pulmonary embolism.  There is increasing dependent airspace opacities.  Patient per nursing is waking up more now on 30 mics of propofol overall.  Did move his left arm I do not appreciate as much of the facial droop.  Has not really moved the right arm but has also moved the bilateral lower extremities.  On my assessment he is fairly sedated.  He is now off the Levophed.  Will cover with antibiotics given the airspace opacities and give a liter of fluid as it is looking more like aspiration as opposed to pulmonary edema.  I will discuss with the ACU.  FINAL CLINICAL IMPRESSION(S) / ED DIAGNOSES   Final diagnoses:  Cardiac arrest ([place])  Lactic acidosis  Aspiration pneumonia of both lower lobes, unspecified aspiration pneumonia type ([place])     Rx / DC Orders   ED Discharge Orders     None        Note:  This document was prepared using Dragon voice recognition software and may include unintentional dictation errors.   Rada Hay, MD 04/08/22 1335

## 2022-04-08 NOTE — Code Documentation (Signed)
EKG obtained

## 2022-04-08 NOTE — Code Documentation (Addendum)
CPR paused for pulse check, No palpable pulse present at this time, pt asystole on monitor. CPR resumed

## 2022-04-08 NOTE — Progress Notes (Signed)
ANTICOAGULATION CONSULT NOTE - Initial Consult  Pharmacy Consult for Heparin drip Indication: ACS, on apixaban PTA for PE  No Known Allergies  Patient Measurements: Height: 6' (182.9 cm) Weight: 101.7 kg (224 lb 3.3 oz) IBW/kg (Calculated) : 77.6 Heparin Dosing Weight: 98.4  Vital Signs: Temp: 96.3 F (35.7 C) (08/13 1800) Temp Source: Bladder (08/13 1501) BP: 132/82 (08/13 1800) Pulse Rate: 56 (08/13 1800)  Labs: Recent Labs    04/08/22 1108 04/08/22 1325 04/08/22 1601 04/08/22 1801 04/08/22 1807  HGB 11.6*  --   --   --   --   HCT 35.0*  --   --   --   --   PLT 219  --   --   --   --   APTT 41*  --   --   --   --   LABPROT 17.0*  --   --   --   --   INR 1.4*  --   --   --   --   HEPARINUNFRC  --   --   --   --  0.93*  CREATININE 3.94*  --   --   --   --   TROPONINIHS 95* 535* 913* 1,131*  --     Estimated Creatinine Clearance: 20.9 mL/min (A) (by C-G formula based on SCr of 3.94 mg/dL (H)).   Medical History: Past Medical History:  Diagnosis Date   Chronic kidney disease    renal insufficiency   Diabetes mellitus without complication ([place])    Pt takes Insulin   Hypertension    Mental disorder    schizoaffective   Renal cell carcinoma ([place])    Wears dentures    full upper and lower    Medications:  Medications Prior to Admission  Medication Sig Dispense Refill Last Dose   amLODipine-valsartan (EXFORGE) 10-320 MG tablet Take 1 tablet by mouth daily.   04/08/2022 at 0800   atorvastatin (LIPITOR) 10 MG tablet Take 10 mg by mouth daily.   04/07/2022 at 1700   cabozantinib (CABOMETYX) 40 MG tablet Take 1 tablet (40 mg total) by mouth daily. Take on an empty stomach, 1 hour before or 2 hours after meals. 30 tablet 1 04/08/2022 at 0630   calcitRIOL (ROCALTROL) 0.25 MCG capsule Take 0.25 mcg by mouth daily.   04/08/2022 at 0800   calcium carbonate (OS-CAL) 600 MG TABS tablet Take 2 tablets (1,200 mg total) by mouth 2 (two) times daily with a meal. 120 tablet 3  04/08/2022 at 0800   carvedilol (COREG) 25 MG tablet Take 25 mg by mouth 2 (two) times daily.   04/08/2022 at 0800   ELIQUIS 2.5 MG TABS tablet TAKE 1 TABLET BY MOUTH TWICE A DAY 60 tablet 3 04/08/2022 at 0800   fenofibrate (TRICOR) 145 MG tablet Take 145 mg by mouth daily.   04/07/2022 at 1700   Insulin Lispro Prot & Lispro (HUMALOG 75/25 MIX) (75-25) 100 UNIT/ML Kwikpen Inject 30 Units into the skin every morning.   04/08/2022 at 0800   lamoTRIgine (LAMICTAL) 100 MG tablet Take 100 mg by mouth 2 (two) times daily.   04/08/2022 at 0800   minoxidil (LONITEN) 2.5 MG tablet Take 2.5 mg by mouth daily.   04/08/2022 at 0800   olmesartan (BENICAR) 40 MG tablet Take 40 mg by mouth daily.   04/08/2022 at 0800   omeprazole (PRILOSEC) 40 MG capsule Take 40 mg by mouth daily.   04/08/2022 at 0730   tamsulosin (FLOMAX) 0.4  MG CAPS capsule Take 0.4 mg by mouth daily.    04/08/2022 at 0800   torsemide (DEMADEX) 20 MG tablet Take 20 mg by mouth daily.   04/08/2022 at 0800   Valbenazine Tosylate (INGREZZA) 40 MG CAPS Take 40 mg by mouth daily.   04/08/2022 at 0800   ziprasidone (GEODON) 80 MG capsule Take 160 mg by mouth at bedtime.   04/07/2022 at [place] 0.05 % EMUL Place 1 drop into the right eye 2 (two) times daily. (Patient not taking: Reported on 08/03/2021)      loperamide (IMODIUM A-D) 2 MG tablet Take 1 tablet (2 mg total) by mouth See admin instructions. Take '4mg'$  with the onset of diarrhea, then take '2mg'$  after every loose bowel movements. Maximum '16mg'$  per 24 hours. 90 tablet 1 prn at prn   NOVOLOG MIX 70/30 FLEXPEN (70-30) 100 UNIT/ML FlexPen Inject 30 Units as directed daily. (Patient not taking: Reported on 08/03/2021)      ondansetron (ZOFRAN) 8 MG tablet TAKE 1 TABLET BY MOUTH EVERY 8 HOURS AS NEEDED FOR NAUSEA OR VOMITING 20 tablet 2 prn at prn   ULTICARE PEN NEEDLES 29G X 12.7MM MISC       Scheduled:   [START ON 04/09/2022] Chlorhexidine Gluconate Cloth  6 each Topical Q0600   docusate  100 mg Per Tube  BID   insulin aspart  0-9 Units Subcutaneous Q4H   ipratropium-albuterol  3 mL Nebulization Q6H   mouth rinse  15 mL Mouth Rinse Q2H   pantoprazole (PROTONIX) IV  40 mg Intravenous Daily   polyethylene glycol  17 g Per Tube Daily   sodium bicarbonate  25 mEq Intravenous Once   Infusions:   sodium chloride Stopped (04/08/22 1515)   azithromycin 500 mg (04/08/22 1520)   heparin 1,300 Units/hr (04/08/22 1821)   norepinephrine (LEVOPHED) Adult infusion     piperacillin-tazobactam (ZOSYN)  IV     propofol (DIPRIVAN) infusion 30 mcg/kg/min (04/08/22 1730)    Assessment: 71 yo male to start Heparin drip for ACS. Cardiac arrest- found down. Hx schizoaffective disorder resident of a group home  On apixaban PTA for hx PE Hgb 11.6  plt 219  INR 1.4  HL 0.93 Pt already on Heparin sub q q8h and last dose of 5000 units given 8/13 '@1522'$     Goal of Therapy:  Heparin level 0.3-0.7 units/ml aPTT 66-102 seconds Monitor platelets by anticoagulation protocol: Yes   Plan:  No bolus since recently got Heparin 5000 units subQ Start heparin infusion at 1300 units/hr Check anti-Xa level in 8 hours and daily while on heparin Continue to monitor H&H and platelets Follow aPTT until HL and aPTT correlate  Annsleigh Dragoo A 04/08/2022,6:43 PM

## 2022-04-08 NOTE — ED Notes (Signed)
Pt resting in bed at this time, NAD noted.

## 2022-04-08 NOTE — ED Notes (Addendum)
PT move LUE towards ET . PT to CT with RT Amy and this RN

## 2022-04-08 NOTE — ED Triage Notes (Signed)
Pt to ED via [facility] from [facility]. Pt arrives to ED CPR in progress. Per EMS pt found at this group home outside by his caretaker. Pt was unresponsive and noted to have left sided facial droop. Per EMS pt had witnessed arrest after their arrival. Pt received 12 minutes of CPR and achieved ROSC. EMS reports that pt re-arrested and CPR was resumed. Pt received a total of 5 doses of Epi, pt had 1 episode of V- fib and was shocked at 300 Joules and was given 300 mg of amio. Pt received 250 mL of fluid PTA. Last pulse check with EMS was upon arrival. Pt was pulseless and in asystole on the monitor. EMS reports that pt has had agonal respirations and + gag reflex.  Pt arrives to ED with LUCAS device in place and ventilations being assisted with BVM.

## 2022-04-08 NOTE — Progress Notes (Addendum)
Pharmacy Electrolyte Replacement  Recent Labs:  Recent Labs    04/08/22 1108  K 3.8  MG 1.7  PHOS 5.8*  CREATININE 3.94*    Low Critical Values (K </= 2.5, Phos </= 1, Mg </= 1) Present: None  Plan: Potassium chloride 10 mEq in 100 mL IV ordered Magnesium sulfate 2 g IV ordered Pharmacy will continue to monitor.  Dara Hoyer, PharmD PGY-1 Pharmacy Resident 04/08/2022 3:31 PM

## 2022-04-08 NOTE — H&P (Addendum)
NAME:  Riley Stuart, MRN:  [id number], DOB:  [birth year], LOS: 0 ADMISSION DATE:  04/08/2022, CONSULTATION DATE:  04/08/2022 REFERRING MD:  Dr. Starleen Blue, CHIEF COMPLAINT:  Cardiac Arrest    Brief Pt Description / Synopsis:  71 year old male admitted following out of hospital cardiac arrest currently of unknown etiology (initial rhythm asystole, approximately 50 minutes of ACLS) with suspected aspiration pneumonia vs. CAP requiring mechanical ventilation.  Concern for stroke vs. possible anoxic brain injury.  History of Present Illness:  Riley Stuart is a 71 year old male with a past medical history significant for ruptured cerebral aneurysm with subarachnoid hemorrhage in 2015, hypertension, diabetes mellitus, renal cell carcinoma on chemotherapy, and chronic kidney disease stage IV who presented to to [facility] ED on 04/08/2022 following out-of-hospital cardiac arrest.  Patient is currently intubated and sedated with no family available, therefore history is obtained from chart review.  Per ED and nursing notes, the patient resides at [name] where he was found unresponsive in a chair with left sided facial drooping by staff member.  Patient did have pulses at that time.  His brother actually talked to on the phone earlier this morning, and seemed to be in his usually state of health.  Fire Department reported he had thready pulses, of which then he lost his pulse.  EMS arrived at 09:50, initial rhythm was asystole, he received 4 rounds of epinephrine with ROSC after about 12 minutes.  EMS attempted to intubate with [name] airway, but were unable to due to gagging.  Patient again lost his pulse, initially asystole and then V-fib for which he received 300 mg of amiodarone and 1 defibrillation.  Upon arrival to the ED, remained pulseless (asystole).  He required another several rounds of CPR and epinephrine.  He was successfully intubated by ED provider, who noted copious secretions.  ROSC  briefly obtained, but then again lost pulse  Total estimated time of ACLS is approximately 50 minutes, and while in ED received a total of 4 mg of epinephrine, 1g calcium, and 1 amp bicarb.    Following ROSC, he was noted to be gagging on ETT, therefore low dose propofol started. EDP provider did note continued left facial droop and not responding to pain.  ED Course: Initial Vital Signs: Pulse 100, respiratory 23, blood pressure, SPO2 100% Significant Labs: Bicarb 21, glucose 145, creatinine 3.94, BUN 27, AST 60, high-sensitivity troponin 95, BNP 125, WBC 10.3, hemoglobin 11.6, hematocrit 35, INR 1.4, PT 17.0, lactic acid 5.6 Postintubation ABG: pH 7.1/PCO2 56/PO2 69/bicarb 17.4 Urine drug screen is negative Imaging Chest X-ray>>IMPRESSION: 1. Fractures of the LEFT 4th through 7th ribs. No evidence of pneumothorax. 2. Diffuse RIGHT lung airspace opacities and mild LEFT lung patchy opacities - question asymmetric edema, infection or aspiration. 3. Endotracheal tube with tip 5 cm above the carina and enteric tube entering the stomach with tip off the field of view. CT Head w/o contrast>>IMPRESSION: 1. No acute intracranial abnormality. 2. 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 Associated inferior frontal gyrus encephalomalacia. CT Cervical Spine>>IMPRESSION: 1. No acute traumatic injury identified in the cervical spine. 2. Satisfactory course of visible right nasoenteric and endotracheal tubes. 3. Partially visible dependent consolidation in the right lung. CT Chest>>IMPRESSION: 1. Extensive dependent lung consolidation right > left. Consider aspiration, pneumonia. Trace if any layering pleural fluid. Underlying Emphysema (ICD10-J43.9). 2. CPR related bilateral 2 through 7 rib fractures, central sternal fracture. 3. ETT and visible enteric tube in good position. 4. Calcified  coronary artery, Aortic Atherosclerosis (ICD10-I70.0). Stable  mild cardiomegaly. CT Angiogram Chest>>IMPRESSION: 1. No evidence of pulmonary embolism to the lobar branch level. 2. Slight worsening of dependent airspace consolidation within the right upper and right lower lobes. 3. Otherwise, no significant interval change in the appearance of the chest compared to recent previous exam. 4. Redemonstrated bilateral rib fractures.  No pneumothorax Medications Administered: 4 mg of epinephrine, 1 g calcium, 1 amp of bicarb, 1 L normal saline, IV cefepime and vancomycin, propofol drip initiated  Later in ED, Nursing reported that he was waking up more on low dose propofol, and was noted to move his LUE (reaching for ETT) along with Bilateral lower extremities, and facial droop is much less pronounced.  PCCM asked to admit for further work-up and treatment.  Pertinent  Medical History   Past Medical History:  Diagnosis Date   Chronic kidney disease    renal insufficiency   Diabetes mellitus without complication ([place])    Pt takes Insulin   Hypertension    Mental disorder    schizoaffective   Renal cell carcinoma ([place])    Wears dentures    full upper and lower    Micro Data:  8/13: Blood culture x2>> 8/13: Urine>> 8/13: Tracheal aspirate>> 8/13: Strep pneumo urinary antigen>> 8/13: Legionella urinary antigen>>  Antimicrobials:  Cefepime x1 dose 8/13 Vancomycin x1 dose 8/13 Azithromycin 8/13>> Zosyn 8/13>>   Significant Hospital Events: Including procedures, antibiotic start and stop dates in addition to other pertinent events   8/13: Presents to ED following out of hospital cardiac arrest.  Intubated by ED.  PCCM asked to admit.  Interim History / Subjective:  -Patient seen and examined in the ED -Currently sedated on propofol, ED nurse reports patient was attempting to reach for ET tube with left upper extremity -CT Head negative for acute abnormality -Hemodynamically stable, Levophed weaned off   Objective   Blood pressure  118/86, pulse 66, temperature 97.9 F (36.6 C), temperature source Bladder, resp. rate (!) 21, weight 118.8 kg, SpO2 91 %.    Vent Mode: AC FiO2 (%):  [50 %-100 %] 100 % Set Rate:  [16 bmp-20 bmp] 20 bmp Vt Set:  [550 mL] 550 mL PEEP:  [5 cmH20-10 cmH20] 10 cmH20  No intake or output data in the 24 hours ending 04/08/22 1441 Filed Weights   04/08/22 1338  Weight: 118.8 kg    Examination: General: Acute on chronically ill-appearing male, laying in bed, intubated and sedated, no acute distress HENT: Atraumatic, normocephalic, neck supple, no JVD Lungs: Coarse breath sounds bilaterally, no wheezing, synchronous with the vent, even Cardiovascular: Regular rate and rhythm, S1-S2, no murmurs, rubs, gallops Abdomen: Obese, soft, nontender, no no guarding or rebound tenderness, bowel sounds positive x4 Extremities: No deformities, no edema Neuro: Sedated, pupils dilated 5 mm and sluggish bilaterally  GU: Foley catheter in place  Resolved Hospital Problem list     Assessment & Plan:   #Out of Hospital Cardiac Arrest (asystole), currently unclear etiology #Elevated Troponin in setting of demand ischemia vs NSTEMI PMHx: Hypertension -Continuous cardiac monitoring -Maintain MAP >65 -IV fluids -Vasopressors as needed to maintain MAP goal -Trend lactic acid until normalized (5.6 ~ 2.5 ~ ) -Trend HS Troponin until peaked (95 ~ 535 ~ ) -Echocardiogram pending  #Acute Hypoxic & Hypercapnic Respiratory Failure in setting of Cardiac arrest & presumed Aspiration Pneumonia vs CAP -Full vent support, implement lung protective strategies -Plateau pressures less than 30 cm H20 -Wean FiO2 & PEEP as  tolerated to maintain O2 sats >92% -Follow intermittent Chest X-ray & ABG as needed -Spontaneous Breathing Trials when respiratory parameters met and mental status permits -Implement VAP Bundle -Prn Bronchodilators -ABX as above  #Aspiration Pneumonia vs CAP -Monitor fever curve -Trend WBC's &  Procalcitonin -Follow cultures as above -Continue empiric Azithromycin & Zosyn pending cultures & sensitivities  #AKI on CKD Stage IV  #Metabolic Acidosis PMHx: Renal cell carcinoma s/p right nephrectomy in 07/2019 with current palliative chemotherapy and Cabozantinib Baseline Creatinine 3.13 with GFR 20 in March 2023 -Monitor I&O's / urinary output -Follow BMP -Ensure adequate renal perfusion -Avoid nephrotoxic agents as able -Replace electrolytes as indicated -Low threshold for Nephrology consultation  #Anemia of Chronic Kidney Disease -Monitor for S/Sx of bleeding -Trend CBC -Heparin SQ for VTE Prophylaxis  -Transfuse for Hgb <7  #Acute Metabolic Encephalopathy #Concern for possible CVA vs. Anoxic Brain Injury Sedation needs in setting of mechanical ventilation PMHx: schizoaffective disorder -Treat metabolic derangements as outlined above -Pt is actually hypothermic, will allow for permissive hypothermia/normothermia -Maintain a RASS goal of 0 to -1 -Propofol and fentanyl as needed to maintain RASS goal -Avoid sedating medications as able -Daily wake up assessment -CT Head at admission negative for acute intracranial abnormality -Obtain EEG & MRI -Low threshold for Neurology consultation based on results of EEG and MRI  Diabetes Mellitus -CBG's q4h; Target range of 140 to 180 -SSI -Follow ICU Hypo/Hyperglycemia protocol     Pt is critically ill.  Prognosis is guarded, high risk for further decompensation ,cardiac arrest and death.  Recommend DNR status.   Best Practice (right click and "Reselect all SmartList Selections" daily)   Diet/type: NPO DVT prophylaxis: prophylactic heparin  GI prophylaxis: PPI Lines: N/A Foley:  Yes, and it is still needed Code Status:  full code Last date of multidisciplinary goals of care discussion [N/A]  8/13: Pt's brother Riley Stuart updated at bedside.  Labs   CBC: Recent Labs  Lab 04/08/22 1108  WBC 10.3  NEUTROABS 5.6   HGB 11.6*  HCT 35.0*  MCV 99.7  PLT 403    Basic Metabolic Panel: Recent Labs  Lab 04/08/22 1108  NA 143  K 3.8  CL 112*  CO2 21*  GLUCOSE 145*  BUN 27*  CREATININE 3.94*  CALCIUM 9.3  MG 1.7  PHOS 5.8*   GFR: CrCl cannot be calculated (Unknown ideal weight.). Recent Labs  Lab 04/08/22 1108 04/08/22 1109 04/08/22 1326  WBC 10.3  --   --   LATICACIDVEN  --  5.6* 2.5*    Liver Function Tests: Recent Labs  Lab 04/08/22 1108  AST 60*  ALT 37  ALKPHOS 55  BILITOT 0.8  PROT 6.1*  ALBUMIN 3.0*   No results for input(s): "LIPASE", "AMYLASE" in the last 168 hours. No results for input(s): "AMMONIA" in the last 168 hours.  ABG    Component Value Date/Time   PHART 7.1 (LL) 04/08/2022 1106   PCO2ART 56 (H) 04/08/2022 1106   PO2ART 69 (L) 04/08/2022 1106   HCO3 17.4 (L) 04/08/2022 1106   TCO2 26 10/27/2013 2250   ACIDBASEDEF 12.6 (H) 04/08/2022 1106   O2SAT 89.6 04/08/2022 1106     Coagulation Profile: Recent Labs  Lab 04/08/22 1108  INR 1.4*    Cardiac Enzymes: No results for input(s): "CKTOTAL", "CKMB", "CKMBINDEX", "TROPONINI" in the last 168 hours.  HbA1C: Hgb A1c MFr Bld  Date/Time Value Ref Range Status  08/10/2019 11:00 AM 6.9 (H) 4.8 - 5.6 % Final  Comment:    REPEATED TO VERIFY (NOTE) Pre diabetes:          5.7%-6.4% Diabetes:              >6.4% Glycemic control for   <7.0% adults with diabetes     CBG: Recent Labs  Lab 04/08/22 1047  GLUCAP 116*    Review of Systems:   Unable to assess due to intubation/sedation/AMS/critical illness   Past Medical History:  He,  has a past medical history of Chronic kidney disease, Diabetes mellitus without complication ([place]), Hypertension, Mental disorder, Renal cell carcinoma ([place]), and Wears dentures.   Surgical History:   Past Surgical History:  Procedure Laterality Date   ANKLE FRACTURE SURGERY     CATARACT EXTRACTION W/PHACO Left 03/08/2021   Procedure: CATARACT EXTRACTION  PHACO AND INTRAOCULAR LENS PLACEMENT (IOC) LEFT DIABETIC 8.48 01:06.9;  Surgeon: Leandrew Koyanagi, MD;  Location: [place];  Service: Ophthalmology;  Laterality: Left;  Diabetic - insulin   CATARACT EXTRACTION W/PHACO Right 03/22/2021   Procedure: CATARACT EXTRACTION PHACO AND INTRAOCULAR LENS PLACEMENT (IOC) RIGHT DIABETIC 6.14 01:08.8;  Surgeon: Leandrew Koyanagi, MD;  Location: [place];  Service: Ophthalmology;  Laterality: Right;  Diabetic - insulin   COLONOSCOPY WITH PROPOFOL N/A 04/15/2020   Procedure: COLONOSCOPY WITH PROPOFOL;  Surgeon: Virgel Manifold, MD;  Location: [facility];  Service: Endoscopy;  Laterality: N/A;   CRANIOTOMY Left 10/27/2013   Procedure: Craniotomy for Aneurysm Clipping;  Surgeon: Consuella Lose, MD;  Location: [place] [facility];  Service: Neurosurgery;  Laterality: Left;   IR RADIOLOGIST EVAL & MGMT  11/22/2020   IR RADIOLOGIST EVAL & MGMT  12/21/2020   KNEE SURGERY     due to fracture.    ROBOTIC ASSITED PARTIAL NEPHRECTOMY Left 08/10/2019   Procedure: XI ROBOTIC ASSITED LAPAROSCOPIC  PARTIAL NEPHRECTOMY;  Surgeon: Raynelle Bring, MD;  Location: [facility];  Service: Urology;  Laterality: Left;     Social History:   reports that he has been smoking cigarettes. He has a 48.00 pack-year smoking history. He has never used smokeless tobacco. He reports that he does not currently use alcohol. He reports that he does not use drugs.   Family History:  His family history includes Breast cancer in his mother; Throat cancer in his brother. There is no history of Prostate cancer, Colon cancer, or Pancreatic cancer.   Allergies No Known Allergies   Home Medications  Prior to Admission medications   Medication Sig Start Date End Date Taking? Authorizing Provider  amLODipine-valsartan (EXFORGE) 10-320 MG tablet Take 1 tablet by mouth daily. 02/06/21  Yes [provider]  atorvastatin (LIPITOR) 10 MG tablet Take 10 mg by mouth daily.  12/28/20  Yes [provider]  cabozantinib (CABOMETYX) 40 MG tablet Take 1 tablet (40 mg total) by mouth daily. Take on an empty stomach, 1 hour before or 2 hours after meals. 01/30/22  Yes Earlie Server, MD  calcitRIOL (ROCALTROL) 0.25 MCG capsule Take 0.25 mcg by mouth daily. 03/26/22  Yes [provider]  calcium carbonate (OS-CAL) 600 MG TABS tablet Take 2 tablets (1,200 mg total) by mouth 2 (two) times daily with a meal. 01/10/22  Yes Earlie Server, MD  carvedilol (COREG) 25 MG tablet Take 25 mg by mouth 2 (two) times daily. 07/16/19  Yes [provider]  ELIQUIS 2.5 MG TABS tablet TAKE 1 TABLET BY MOUTH TWICE A DAY 02/13/22  Yes Earlie Server, MD  fenofibrate (TRICOR) 145 MG tablet Take 145 mg  by mouth daily.   Yes [provider]  Insulin Lispro Prot & Lispro (HUMALOG 75/25 MIX) (75-25) 100 UNIT/ML Kwikpen Inject 30 Units into the skin every morning. 04/10/21  Yes [provider]  lamoTRIgine (LAMICTAL) 100 MG tablet Take 100 mg by mouth 2 (two) times daily.   Yes [provider]  minoxidil (LONITEN) 2.5 MG tablet Take 2.5 mg by mouth daily.   Yes [provider]  olmesartan (BENICAR) 40 MG tablet Take 40 mg by mouth daily. 10/25/21  Yes [provider]  omeprazole (PRILOSEC) 40 MG capsule Take 40 mg by mouth daily. 10/05/20  Yes [provider]  tamsulosin (FLOMAX) 0.4 MG CAPS capsule Take 0.4 mg by mouth daily.  02/06/19  Yes [provider]  torsemide (DEMADEX) 20 MG tablet Take 20 mg by mouth daily. 07/24/21  Yes [provider]  Valbenazine Tosylate (INGREZZA) 40 MG CAPS Take 40 mg by mouth daily.   Yes [provider]  ziprasidone (GEODON) 80 MG capsule Take 160 mg by mouth at bedtime. 07/27/19  Yes [provider]  acetaminophen (TYLENOL) 650 MG CR tablet Take by mouth.    [provider]  DUREZOL 0.05 % EMUL Place 1 drop into the right eye 2 (two) times daily. Patient not taking:  Reported on 08/03/2021 03/13/21   [provider]  furosemide (LASIX) 20 MG tablet Take 0.5 tablets (10 mg total) by mouth daily. Patient not taking: Reported on 04/08/2022 03/10/21   Earlie Server, MD  loperamide (IMODIUM A-D) 2 MG tablet Take 1 tablet (2 mg total) by mouth See admin instructions. Take 30m with the onset of diarrhea, then take 261mafter every loose bowel movements. Maximum 1664mer 24 hours. 03/10/21   Yu,Earlie ServerD  NOVOLOG MIX 70/30 FLEXPEN (70-30) 100 UNIT/ML FlexPen Inject 30 Units as directed daily. Patient not taking: Reported on 08/03/2021 06/24/19   [provider]  olmesartan (BENICAR) 20 MG tablet Take 20 mg by mouth daily. 1 Tablet(s) By Mouth Daily Patient not taking: Reported on 04/08/2022 06/19/21   [provider]  ondansetron (ZOFRAN) 8 MG tablet TAKE 1 TABLET BY MOUTH EVERY 8 HOURS AS NEEDED FOR NAUSEA OR VOMITING 01/09/22   LeoDarl PikesPH-CPP  ULTICARE PEN NEEDLES 29G X 12.7MM MISC  11/02/19   [provider]     Critical care time: 50 minutes     JerDarel HongGACNP-BC [facility] Prefer epic messenger for cross cover needs If after hours, please call E-link

## 2022-04-08 NOTE — ED Notes (Signed)
PT moving LUE towards ET in CT scanner

## 2022-04-08 NOTE — Progress Notes (Addendum)
Pharmacy Antibiotic Note  KONRAD HOAK is a 71 y.o. male with PMH ruptured cerebral aneurysm with subarachnoid hemorrhage, HTN, DM, renal cell carcinoma and chronic kidney disease admitted on 04/08/2022 with pneumonia.  Pharmacy has been consulted for piperacillin-tazobactam dosing.  Assessment: Pt admitted to ICU following out-of-hospital cardiac arrest currently of unknown etiology, with acute hypoxic and hypercapnic respiratory failure in setting of cardiac arrest and presumed aspiration pneumonia vs. CAP. Pt is currently on full vent support and is afebrile with VSS. WBCs are WNL. Pt is in AKI with Scr 3.94 (BL Scr 3.3-3.6).  Plan: Zosyn 3.375g IV q8h (4 hour infusion). Monitor renal function given patient's AKI  Weight: 101.7 kg (224 lb 3.3 oz)  Temp (24hrs), Avg:97.1 F (36.2 C), Min:96.3 F (35.7 C), Max:97.9 F (36.6 C)  Recent Labs  Lab 04/08/22 1108 04/08/22 1109 04/08/22 1326  WBC 10.3  --   --   CREATININE 3.94*  --   --   LATICACIDVEN  --  5.6* 2.5*    CrCl cannot be calculated (Unknown ideal weight.).    No Known Allergies  Antimicrobials this admission: 8/13 Cefepime 2 g x 1 8/13 Vancomycin 2 g 8/13 >>  Microbiology results: 8/13 MRSA PCR: pending  Thank you for allowing pharmacy to be a part of this patient's care.  Delena Bali 04/08/2022 3:12 PM

## 2022-04-08 NOTE — Code Documentation (Signed)
CPR in progress. 

## 2022-04-08 NOTE — Code Documentation (Addendum)
CPR paused for pulse check, Pt has palpable pulse at this time

## 2022-04-09 ENCOUNTER — Inpatient Hospital Stay: Payer: Medicare Other

## 2022-04-09 ENCOUNTER — Inpatient Hospital Stay (HOSPITAL_COMMUNITY)
Admit: 2022-04-09 | Discharge: 2022-04-09 | Disposition: A | Discharge status: Home / Self Care | Payer: Medicare Other | Attending: Pulmonary Disease | Admitting: Pulmonary Disease

## 2022-04-09 DIAGNOSIS — E1169 Type 2 diabetes mellitus with other specified complication: Secondary | ICD-10-CM

## 2022-04-09 DIAGNOSIS — I214 Non-ST elevation (NSTEMI) myocardial infarction: Secondary | ICD-10-CM | POA: Diagnosis not present

## 2022-04-09 DIAGNOSIS — I469 Cardiac arrest, cause unspecified: Secondary | ICD-10-CM | POA: Diagnosis not present

## 2022-04-09 DIAGNOSIS — N184 Chronic kidney disease, stage 4 (severe): Secondary | ICD-10-CM

## 2022-04-09 DIAGNOSIS — J9601 Acute respiratory failure with hypoxia: Secondary | ICD-10-CM | POA: Diagnosis not present

## 2022-04-09 DIAGNOSIS — J9602 Acute respiratory failure with hypercapnia: Secondary | ICD-10-CM | POA: Diagnosis not present

## 2022-04-09 LAB — ECHOCARDIOGRAM COMPLETE
AR max vel: 1.93 cm2
AV Area VTI: 1.8 cm2
AV Area mean vel: 1.77 cm2
AV Mean grad: 7 mmHg
AV Peak grad: 11 mmHg
Ao pk vel: 1.66 m/s
Area-P 1/2: 4.86 cm2
Height: 72 in
S' Lateral: 2.1 cm
Weight: 3587.33 oz

## 2022-04-09 LAB — APTT
aPTT: >200 seconds (ref 24–36)
aPTT: >200 seconds (ref 24–36)
aPTT: 169 seconds (ref 24–36)

## 2022-04-09 LAB — LIPID PANEL
Cholesterol: 86 mg/dL (ref 0–200)
HDL: 26 mg/dL — ABNORMAL LOW (ref >40)
LDL Cholesterol: 37 mg/dL (ref 0–99)
Total CHOL/HDL Ratio: 3.3 RATIO
Triglycerides: 114 mg/dL (ref <150)
VLDL: 23 mg/dL (ref 0–40)

## 2022-04-09 LAB — CBC
HCT: 32.1 % — ABNORMAL LOW (ref 39.0–52.0)
Hemoglobin: 11.3 g/dL — ABNORMAL LOW (ref 13.0–17.0)
MCH: 32.9 pg (ref 26.0–34.0)
MCHC: 35.2 g/dL (ref 30.0–36.0)
MCV: 93.6 fL (ref 80.0–100.0)
Platelets: 206 10*3/uL (ref 150–400)
RBC: 3.43 MIL/uL — ABNORMAL LOW (ref 4.22–5.81)
RDW: 17.1 % — ABNORMAL HIGH (ref 11.5–15.5)
WBC: 12.1 10*3/uL — ABNORMAL HIGH (ref 4.0–10.5)
nRBC: 0.7 % — ABNORMAL HIGH (ref 0.0–0.2)

## 2022-04-09 LAB — TRIGLYCERIDES: Triglycerides: 110 mg/dL (ref <150)

## 2022-04-09 LAB — GLUCOSE, CAPILLARY
Glucose-Capillary: 95 mg/dL (ref 70–99)
Glucose-Capillary: 102 mg/dL — ABNORMAL HIGH (ref 70–99)
Glucose-Capillary: 132 mg/dL — ABNORMAL HIGH (ref 70–99)
Glucose-Capillary: 151 mg/dL — ABNORMAL HIGH (ref 70–99)
Glucose-Capillary: 189 mg/dL — ABNORMAL HIGH (ref 70–99)
Glucose-Capillary: 183 mg/dL — ABNORMAL HIGH (ref 70–99)

## 2022-04-09 LAB — HEPATIC FUNCTION PANEL
ALT: 67 U/L — ABNORMAL HIGH (ref 0–44)
AST: 137 U/L — ABNORMAL HIGH (ref 15–41)
Albumin: 2.8 g/dL — ABNORMAL LOW (ref 3.5–5.0)
Alkaline Phosphatase: 36 U/L — ABNORMAL LOW (ref 38–126)
Bilirubin, Direct: 0.3 mg/dL — ABNORMAL HIGH (ref 0.0–0.2)
Indirect Bilirubin: 0.4 mg/dL (ref 0.3–0.9)
Total Bilirubin: 0.7 mg/dL (ref 0.3–1.2)
Total Protein: 5.6 g/dL — ABNORMAL LOW (ref 6.5–8.1)

## 2022-04-09 LAB — BASIC METABOLIC PANEL
Anion gap: 11 (ref 5–15)
BUN: 37 mg/dL — ABNORMAL HIGH (ref 8–23)
CO2: 19 mmol/L — ABNORMAL LOW (ref 22–32)
Calcium: 8.5 mg/dL — ABNORMAL LOW (ref 8.9–10.3)
Chloride: 113 mmol/L — ABNORMAL HIGH (ref 98–111)
Creatinine, Ser: 3.74 mg/dL — ABNORMAL HIGH (ref 0.61–1.24)
GFR, Estimated: 16 mL/min — ABNORMAL LOW (ref >60)
Glucose, Bld: 94 mg/dL (ref 70–99)
Potassium: 3.8 mmol/L (ref 3.5–5.1)
Sodium: 143 mmol/L (ref 135–145)

## 2022-04-09 LAB — URINE CULTURE: Culture: NO GROWTH

## 2022-04-09 LAB — MAGNESIUM: Magnesium: 1.8 mg/dL (ref 1.7–2.4)

## 2022-04-09 LAB — TROPONIN I (HIGH SENSITIVITY): Troponin I (High Sensitivity): 716 ng/L (ref <18)

## 2022-04-09 LAB — HEMOGLOBIN A1C
Hgb A1c MFr Bld: 5.7 % — ABNORMAL HIGH (ref 4.8–5.6)
Mean Plasma Glucose: 116.89 mg/dL

## 2022-04-09 LAB — HEPARIN LEVEL (UNFRACTIONATED): Heparin Unfractionated: >1.1 IU/mL — ABNORMAL HIGH (ref 0.30–0.70)

## 2022-04-09 LAB — PHOSPHORUS: Phosphorus: 5.2 mg/dL — ABNORMAL HIGH (ref 2.5–4.6)

## 2022-04-09 LAB — PROCALCITONIN: Procalcitonin: 37.25 ng/mL

## 2022-04-09 LAB — STREP PNEUMONIAE URINARY ANTIGEN: Strep Pneumo Urinary Antigen: NEGATIVE

## 2022-04-09 MED ORDER — METOPROLOL TARTRATE 25 MG PO TABS
25.0000 mg | ORAL_TABLET | Freq: Two times a day (BID) | ORAL | Status: DC
  Administered 2022-04-09 – 2022-04-13 (×8): 25 mg via ORAL
  Filled 2022-04-09 (×8): qty 1

## 2022-04-09 MED ORDER — BUDESONIDE 0.5 MG/2ML IN SUSP
0.5000 mg | Freq: Two times a day (BID) | RESPIRATORY_TRACT | Status: DC
  Administered 2022-04-09 – 2022-04-17 (×16): 0.5 mg via RESPIRATORY_TRACT
  Filled 2022-04-09 (×16): qty 2

## 2022-04-09 MED ORDER — METHYLPREDNISOLONE SODIUM SUCC 40 MG IJ SOLR
40.0000 mg | Freq: Two times a day (BID) | INTRAMUSCULAR | Status: DC
Start: 2022-04-09 — End: 2022-04-10
  Administered 2022-04-09: 40 mg via INTRAVENOUS
  Filled 2022-04-09: qty 1

## 2022-04-09 MED ORDER — FENTANYL CITRATE PF 50 MCG/ML IJ SOSY
50.0000 ug | PREFILLED_SYRINGE | INTRAMUSCULAR | Status: DC | PRN
  Administered 2022-04-09 – 2022-04-12 (×9): 50 ug via INTRAVENOUS
  Filled 2022-04-09 (×9): qty 1

## 2022-04-09 MED ORDER — STERILE WATER FOR INJECTION IJ SOLN
INTRAMUSCULAR | Status: AC
  Filled 2022-04-09: qty 10

## 2022-04-09 MED ORDER — POLYETHYLENE GLYCOL 3350 17 G PO PACK: 17.0000 g | PACK | Freq: Every day | ORAL | Status: DC | PRN

## 2022-04-09 MED ORDER — TAMSULOSIN HCL 0.4 MG PO CAPS
0.4000 mg | ORAL_CAPSULE | Freq: Every day | ORAL | Status: DC
Start: 2022-04-09 — End: 2022-04-17
  Administered 2022-04-09 – 2022-04-17 (×9): 0.4 mg via ORAL
  Filled 2022-04-09 (×9): qty 1

## 2022-04-09 MED ORDER — PANTOPRAZOLE SODIUM 40 MG IV SOLR
40.0000 mg | Freq: Every day | INTRAVENOUS | Status: DC
Start: 2022-04-09 — End: 2022-04-11
  Administered 2022-04-09 – 2022-04-11 (×3): 40 mg via INTRAVENOUS
  Filled 2022-04-09 (×3): qty 10

## 2022-04-09 MED ORDER — PANTOPRAZOLE 2 MG/ML SUSPENSION
40.0000 mg | Freq: Every day | ORAL | Status: DC
Start: 2022-04-09 — End: 2022-04-09

## 2022-04-09 MED ORDER — LAMOTRIGINE 100 MG PO TABS
100.0000 mg | ORAL_TABLET | Freq: Two times a day (BID) | ORAL | Status: DC
Start: 2022-04-09 — End: 2022-04-17
  Administered 2022-04-09 – 2022-04-17 (×17): 100 mg via ORAL
  Filled 2022-04-09 (×4): qty 1
  Filled 2022-04-09 (×2): qty 4
  Filled 2022-04-09: qty 1
  Filled 2022-04-09 (×3): qty 4
  Filled 2022-04-09 (×2): qty 1
  Filled 2022-04-09 (×3): qty 4
  Filled 2022-04-09: qty 1
  Filled 2022-04-09: qty 4
  Filled 2022-04-09: qty 1

## 2022-04-09 MED ORDER — HEPARIN (PORCINE) 25000 UT/250ML-% IV SOLN: 1000.0000 [IU]/h | INTRAVENOUS | Status: DC

## 2022-04-09 MED ORDER — ARFORMOTEROL TARTRATE 15 MCG/2ML IN NEBU
15.0000 ug | INHALATION_SOLUTION | Freq: Two times a day (BID) | RESPIRATORY_TRACT | Status: DC
  Administered 2022-04-09 – 2022-04-17 (×14): 15 ug via RESPIRATORY_TRACT
  Filled 2022-04-09 (×17): qty 2

## 2022-04-09 MED ORDER — MIDAZOLAM HCL 2 MG/2ML IJ SOLN
0.5000 mg | INTRAMUSCULAR | Status: DC | PRN
  Administered 2022-04-10: 0.5 mg via INTRAVENOUS
  Filled 2022-04-09: qty 2

## 2022-04-09 MED ORDER — REVEFENACIN 175 MCG/3ML IN SOLN
175.0000 ug | Freq: Every day | RESPIRATORY_TRACT | Status: DC
  Administered 2022-04-10 – 2022-04-17 (×8): 175 ug via RESPIRATORY_TRACT
  Filled 2022-04-09 (×8): qty 3

## 2022-04-09 MED ORDER — ASPIRIN 81 MG PO TBEC
81.0000 mg | DELAYED_RELEASE_TABLET | Freq: Every day | ORAL | Status: DC
  Administered 2022-04-10 – 2022-04-17 (×8): 81 mg via ORAL
  Filled 2022-04-09 (×8): qty 1

## 2022-04-09 MED ORDER — ORAL CARE MOUTH RINSE
15.0000 mL | Freq: Four times a day (QID) | OROMUCOSAL | Status: DC
  Administered 2022-04-09 – 2022-04-12 (×9): 15 mL via OROMUCOSAL

## 2022-04-09 MED ORDER — HEPARIN (PORCINE) 25000 UT/250ML-% IV SOLN
700.0000 [IU]/h | INTRAVENOUS | Status: AC
  Administered 2022-04-09: 700 [IU]/h via INTRAVENOUS
  Filled 2022-04-09 (×2): qty 250

## 2022-04-09 MED ORDER — IPRATROPIUM-ALBUTEROL 0.5-2.5 (3) MG/3ML IN SOLN
3.0000 mL | Freq: Four times a day (QID) | RESPIRATORY_TRACT | Status: DC | PRN
  Administered 2022-04-09 – 2022-04-10 (×2): 3 mL via RESPIRATORY_TRACT
  Filled 2022-04-09 (×2): qty 3

## 2022-04-09 MED ORDER — DOCUSATE SODIUM 50 MG/5ML PO LIQD
100.0000 mg | Freq: Two times a day (BID) | ORAL | Status: DC | PRN
Start: 2022-04-09 — End: 2022-04-09

## 2022-04-09 MED ORDER — POTASSIUM CHLORIDE 10 MEQ/100ML IV SOLN
10.0000 meq | INTRAVENOUS | Status: AC
  Administered 2022-04-09 (×2): 10 meq via INTRAVENOUS
  Filled 2022-04-09 (×2): qty 100

## 2022-04-09 MED ORDER — MAGNESIUM SULFATE 2 GM/50ML IV SOLN
2.0000 g | Freq: Once | INTRAVENOUS | Status: AC
Start: 2022-04-09 — End: 2022-04-09
  Administered 2022-04-09: 2 g via INTRAVENOUS
  Filled 2022-04-09: qty 50

## 2022-04-09 MED ORDER — POTASSIUM CITRATE-CITRIC ACID 1100-334 MG/5ML PO SOLN
20.0000 meq | Freq: Once | ORAL | Status: DC
  Filled 2022-04-09: qty 10

## 2022-04-09 MED ORDER — DOCUSATE SODIUM 100 MG PO CAPS
100.0000 mg | ORAL_CAPSULE | Freq: Two times a day (BID) | ORAL | Status: DC | PRN
Start: 2022-04-09 — End: 2022-04-17

## 2022-04-09 NOTE — Progress Notes (Signed)
ANTICOAGULATION CONSULT NOTE  Pharmacy Consult for Heparin drip Indication: ACS, on apixaban PTA for PE  No Known Allergies  Patient Measurements: Height: 6' (182.9 cm) Weight: 101.7 kg (224 lb 3.3 oz) IBW/kg (Calculated) : 77.6 Heparin Dosing Weight: 98.4  Vital Signs: Temp: 98.8 F (37.1 C) (08/14 0400) BP: 123/74 (08/14 0400) Pulse Rate: 40 (08/14 0400)  Labs: Recent Labs    04/08/22 1108 04/08/22 1325 04/08/22 1601 04/08/22 1801 04/08/22 1807 04/09/22 0213  HGB 11.6*  --   --   --   --  11.3*  HCT 35.0*  --   --   --   --  32.1*  PLT 219  --   --   --   --  206  APTT 41*  --   --   --   --  >200*  LABPROT 17.0*  --   --   --   --   --   INR 1.4*  --   --   --   --   --   HEPARINUNFRC  --   --   --   --  0.93*  --   CREATININE 3.94*  --   --   --   --  3.74*  TROPONINIHS 95* 535* 913* 1,131*  --   --      Estimated Creatinine Clearance: 22 mL/min (A) (by C-G formula based on SCr of 3.74 mg/dL (H)).   Medical History: Past Medical History:  Diagnosis Date   Chronic kidney disease    renal insufficiency   Diabetes mellitus without complication ([place])    Pt takes Insulin   Hypertension    Mental disorder    schizoaffective   Renal cell carcinoma ([place])    Wears dentures    full upper and lower    Medications:  Medications Prior to Admission  Medication Sig Dispense Refill Last Dose   amLODipine-valsartan (EXFORGE) 10-320 MG tablet Take 1 tablet by mouth daily.   04/08/2022 at 0800   atorvastatin (LIPITOR) 10 MG tablet Take 10 mg by mouth daily.   04/07/2022 at 1700   cabozantinib (CABOMETYX) 40 MG tablet Take 1 tablet (40 mg total) by mouth daily. Take on an empty stomach, 1 hour before or 2 hours after meals. 30 tablet 1 04/08/2022 at 0630   calcitRIOL (ROCALTROL) 0.25 MCG capsule Take 0.25 mcg by mouth daily.   04/08/2022 at 0800   calcium carbonate (OS-CAL) 600 MG TABS tablet Take 2 tablets (1,200 mg total) by mouth 2 (two) times daily with a meal. 120  tablet 3 04/08/2022 at 0800   carvedilol (COREG) 25 MG tablet Take 25 mg by mouth 2 (two) times daily.   04/08/2022 at 0800   ELIQUIS 2.5 MG TABS tablet TAKE 1 TABLET BY MOUTH TWICE A DAY 60 tablet 3 04/08/2022 at 0800   fenofibrate (TRICOR) 145 MG tablet Take 145 mg by mouth daily.   04/07/2022 at 1700   Insulin Lispro Prot & Lispro (HUMALOG 75/25 MIX) (75-25) 100 UNIT/ML Kwikpen Inject 30 Units into the skin every morning.   04/08/2022 at 0800   lamoTRIgine (LAMICTAL) 100 MG tablet Take 100 mg by mouth 2 (two) times daily.   04/08/2022 at 0800   minoxidil (LONITEN) 2.5 MG tablet Take 2.5 mg by mouth daily.   04/08/2022 at 0800   olmesartan (BENICAR) 40 MG tablet Take 40 mg by mouth daily.   04/08/2022 at 0800   omeprazole (PRILOSEC) 40 MG capsule Take 40 mg by  mouth daily.   04/08/2022 at 0730   tamsulosin (FLOMAX) 0.4 MG CAPS capsule Take 0.4 mg by mouth daily.    04/08/2022 at 0800   torsemide (DEMADEX) 20 MG tablet Take 20 mg by mouth daily.   04/08/2022 at 0800   Valbenazine Tosylate (INGREZZA) 40 MG CAPS Take 40 mg by mouth daily.   04/08/2022 at 0800   ziprasidone (GEODON) 80 MG capsule Take 160 mg by mouth at bedtime.   04/07/2022 at [place] 0.05 % EMUL Place 1 drop into the right eye 2 (two) times daily. (Patient not taking: Reported on 08/03/2021)      loperamide (IMODIUM A-D) 2 MG tablet Take 1 tablet (2 mg total) by mouth See admin instructions. Take '4mg'$  with the onset of diarrhea, then take '2mg'$  after every loose bowel movements. Maximum '16mg'$  per 24 hours. 90 tablet 1 prn at prn   NOVOLOG MIX 70/30 FLEXPEN (70-30) 100 UNIT/ML FlexPen Inject 30 Units as directed daily. (Patient not taking: Reported on 08/03/2021)      ondansetron (ZOFRAN) 8 MG tablet TAKE 1 TABLET BY MOUTH EVERY 8 HOURS AS NEEDED FOR NAUSEA OR VOMITING 20 tablet 2 prn at prn   ULTICARE PEN NEEDLES 29G X 12.7MM MISC       Scheduled:   Chlorhexidine Gluconate Cloth  6 each Topical Q0600   docusate  100 mg Per Tube BID    insulin aspart  0-9 Units Subcutaneous Q4H   ipratropium-albuterol  3 mL Nebulization Q6H   mouth rinse  15 mL Mouth Rinse Q2H   pantoprazole (PROTONIX) IV  40 mg Intravenous Daily   polyethylene glycol  17 g Per Tube Daily   sodium bicarbonate  25 mEq Intravenous Once   Infusions:   sodium chloride Stopped (04/08/22 1515)   azithromycin Stopped (04/08/22 2332)   norepinephrine (LEVOPHED) Adult infusion     piperacillin-tazobactam (ZOSYN)  IV Stopped (04/09/22 0138)   propofol (DIPRIVAN) infusion 35 mcg/kg/min (04/09/22 0402)    Assessment: 71 yo male to start Heparin drip for ACS. Cardiac arrest- found down. Hx schizoaffective disorder resident of a group home  On apixaban PTA for hx PE Hgb 11.6  plt 219  INR 1.4  HL 0.93 Pt already on Heparin sub q q8h and last dose of 5000 units given 8/13 '@1522'$     Goal of Therapy:  Heparin level 0.3-0.7 units/ml aPTT 66-102 seconds Monitor platelets by anticoagulation protocol: Yes   8/14 0213 aPTT >200, supratherapeutic   Plan:  Contacted RN, hold infusion for 1 hr. Restart HL at 1000 units/hr Recheck aPTT in 8 hr after restart Follow aPTT until HL and aPTT correlate HL & CBC daily while on heparin  Renda Rolls, PharmD, [facility] 04/09/2022 4:10 AM

## 2022-04-09 NOTE — Procedures (Signed)
Extubation note Pulling at tube so tested cough/commands/secretions/cuff leak; all were acceptable. Extubated without issue and placed on [place]. Able to state name, no stridor. No immediate complications.

## 2022-04-09 NOTE — Progress Notes (Signed)
[place] for IV Heparin Indication: ACS, on apixaban PTA for Hx PE  Patient Measurements: Height: 6' (182.9 cm) Weight: 101.7 kg (224 lb 3.3 oz) IBW/kg (Calculated) : 77.6 Heparin Dosing Weight: 98.4  Labs: Recent Labs    04/08/22 1108 04/08/22 1325 04/08/22 1601 04/08/22 1801 04/08/22 1807 04/09/22 0213 04/09/22 1324 04/09/22 1543  HGB 11.6*  --   --   --   --  11.3*  --   --   HCT 35.0*  --   --   --   --  32.1*  --   --   PLT 219  --   --   --   --  206  --   --   APTT 41*  --   --   --   --  >200* >200* 169*  LABPROT 17.0*  --   --   --   --   --   --   --   INR 1.4*  --   --   --   --   --   --   --   HEPARINUNFRC  --   --   --   --  0.93*  --  >1.10*  --   CREATININE 3.94*  --   --   --   --  3.74*  --   --   TROPONINIHS 95*   < > 913* 1,131*  --  716*  --   --    < > = values in this interval not displayed.    Estimated Creatinine Clearance: 22 mL/min (A) (by C-G formula based on SCr of 3.74 mg/dL (H)).  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 Patient is currently on IV heparin given concern for acute coronary syndrome.  Goal of Therapy:  Heparin level 0.3-0.7 units/ml aPTT 66-102 seconds Monitor platelets by anticoagulation protocol: Yes   0814 0213 aPTT >200, supratherapeutic 0814 1324 aPTT >200, supratherapeutic (repeat below to confirm) 0814 1543 aPTT 169, supratherapeutic 1000 > 700  un/hr  Plan:  --Repeat aPTT remains elevated. --Heparin infusion was held pending the result, will now resume at reduced rate of 700 units/hr. --Re-check aPTT 8 hours after rate change --Daily CBC per protocol while on IV heparin  Lorna Dibble  04/09/2022 4:58 PM

## 2022-04-09 NOTE — Progress Notes (Signed)
Patient extubated by Dr. Tamala Julian at [facility] to 4L [place] without complications.

## 2022-04-09 NOTE — Progress Notes (Signed)
More dyspneic with chest pain with inspiration related to rib fx.  Will start some fentanyl to see if we can reduce splinting.  Check CXR, high risk to get a PTX.  Erskine Emery MD PCCM

## 2022-04-09 NOTE — Progress Notes (Signed)
PT Cancellation Note  Patient Details Name: VICKEY EWBANK MRN: [id number] DOB: 1951/02/02   Cancelled Treatment:    Reason Eval/Treat Not Completed: Other (comment).  PT consult received.  Chart reviewed.  Discussed pt's status with pt's nurse.  Pt just extubated this morning.  Will hold PT at this time and re-attempt PT evaluation tomorrow.  Leitha Bleak, PT 04/09/22, 2:32 PM

## 2022-04-09 NOTE — Progress Notes (Signed)
[place] for IV Heparin Indication: ACS, on apixaban PTA for Hx PE  Patient Measurements: Height: 6' (182.9 cm) Weight: 101.7 kg (224 lb 3.3 oz) IBW/kg (Calculated) : 77.6 Heparin Dosing Weight: 98.4  Labs: Recent Labs    04/08/22 1108 04/08/22 1325 04/08/22 1601 04/08/22 1801 04/08/22 1807 04/09/22 0213 04/09/22 1324  HGB 11.6*  --   --   --   --  11.3*  --   HCT 35.0*  --   --   --   --  32.1*  --   PLT 219  --   --   --   --  206  --   APTT 41*  --   --   --   --  >200* >200*  LABPROT 17.0*  --   --   --   --   --   --   INR 1.4*  --   --   --   --   --   --   HEPARINUNFRC  --   --   --   --  0.93*  --  >1.10*  CREATININE 3.94*  --   --   --   --  3.74*  --   TROPONINIHS 95*   < > 913* 1,131*  --  716*  --    < > = values in this interval not displayed.    Estimated Creatinine Clearance: 22 mL/min (A) (by C-G formula based on SCr of 3.74 mg/dL (H)).  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 Patient is currently on IV heparin given concern for acute coronary syndrome.  Goal of Therapy:  Heparin level 0.3-0.7 units/ml aPTT 66-102 seconds Monitor platelets by anticoagulation protocol: Yes   0814 0213 aPTT >200, supratherapeutic 0814 1324 aPTT >200, supratherapeutic  Plan:  --Re-send stat aPTT to confirm --Hold heparin infusion pending result --If level accurate, plan to decrease heparin infusion  to 700 units/hr --Re-check aPTT 8 hours after rate change --Daily CBC per protocol while on IV heparin  Benita Gutter  04/09/2022 2:26 PM

## 2022-04-09 NOTE — Progress Notes (Addendum)
NAME:  Riley Stuart, MRN:  [id number], DOB:  May 16, 1951, LOS: 1 ADMISSION DATE:  04/08/2022, CONSULTATION DATE:  04/08/2022 REFERRING MD:  Dr. Starleen Blue, CHIEF COMPLAINT:  Cardiac Arrest    Brief Pt Description / Synopsis:  71 year old male admitted following out of hospital cardiac arrest currently of unknown etiology (initial rhythm asystole, approximately 50 minutes of ACLS) with suspected aspiration pneumonia vs. CAP requiring mechanical ventilation.  Concern for stroke vs. possible anoxic brain injury.  History of Present Illness:  Riley Stuart is a 71 year old male with a past medical history significant for ruptured cerebral aneurysm with subarachnoid hemorrhage in 2015, hypertension, diabetes mellitus, renal cell carcinoma on chemotherapy, and chronic kidney disease stage IV who presented to to [facility] ED on 04/08/2022 following out-of-hospital cardiac arrest.  Patient is currently intubated and sedated with no family available, therefore history is obtained from chart review.  Per ED and nursing notes, the patient resides at [name] where he was found unresponsive in a chair with left sided facial drooping by staff member.  Patient did have pulses at that time.  His brother actually talked to on the phone earlier this morning, and seemed to be in his usually state of health.  Fire Department reported he had thready pulses, of which then he lost his pulse.  EMS arrived at 09:50, initial rhythm was asystole, he received 4 rounds of epinephrine with ROSC after about 12 minutes.  EMS attempted to intubate with [facility] airway, but were unable to due to gagging.  Patient again lost his pulse, initially asystole and then V-fib for which he received 300 mg of amiodarone and 1 defibrillation.  Upon arrival to the ED, remained pulseless (asystole).  He required another several rounds of CPR and epinephrine.  He was successfully intubated by ED provider, who noted copious secretions.  ROSC  briefly obtained, but then again lost pulse  Total estimated time of ACLS is approximately 50 minutes, and while in ED received a total of 4 mg of epinephrine, 1g calcium, and 1 amp bicarb.    Following ROSC, he was noted to be gagging on ETT, therefore low dose propofol started. EDP provider did note continued left facial droop and not responding to pain.  ED Course: Initial Vital Signs: Pulse 100, respiratory 23, blood pressure, SPO2 100% Significant Labs: Bicarb 21, glucose 145, creatinine 3.94, BUN 27, AST 60, high-sensitivity troponin 95, BNP 125, WBC 10.3, hemoglobin 11.6, hematocrit 35, INR 1.4, PT 17.0, lactic acid 5.6 Postintubation ABG: pH 7.1/PCO2 56/PO2 69/bicarb 17.4 Urine drug screen is negative Imaging Chest X-ray>>IMPRESSION: Fractures of the LEFT 4th through 7th ribs. No evidence of pneumothorax. Diffuse RIGHT lung airspace opacities and mild LEFT lung patchy opacities - question asymmetric edema, infection or aspiration. Endotracheal tube with tip 5 cm above the carina and enteric tube entering the stomach with tip off the field of view. CT Head w/o contrast>>No acute intracranial abnormality. 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 Associated inferior frontal gyrus encephalomalacia. CT Cervical Spine>>No acute traumatic injury identified in the cervical spine. Satisfactory course of visible right nasoenteric and endotracheal tubes. Partially visible dependent consolidation in the right lung. CT Chest>>Extensive dependent lung consolidation right > left. Consider aspiration, pneumonia. Trace if any layering pleural fluid. Underlying Emphysema (ICD10-J43.9). CPR related bilateral 2 through 7 rib fractures, central sternal fracture. ETT and visible enteric tube in good position. Calcified coronary artery, Aortic Atherosclerosis (ICD10-I70.0). Stable mild cardiomegaly. CT Angiogram Chest>>No evidence of pulmonary embolism  to the lobar branch  level. Slight worsening of dependent airspace consolidation within the right upper and right lower lobes. Otherwise, no significant interval change in the appearance of the chest compared to recent previous exam. 4. Redemonstrated bilateral rib fractures.  No pneumothorax Medications Administered: 4 mg of epinephrine, 1 g calcium, 1 amp of bicarb, 1 L normal saline, IV cefepime and vancomycin, propofol drip initiated  Later in ED, Nursing reported that he was waking up more on low dose propofol, and was noted to move his LUE (reaching for ETT) along with Bilateral lower extremities, and facial droop is much less pronounced.  PCCM asked to admit for further work-up and treatment.  Pertinent  Medical History   Past Medical History:  Diagnosis Date   Chronic kidney disease    renal insufficiency   Diabetes mellitus without complication ([name])    Pt takes Insulin   Hypertension    Mental disorder    schizoaffective   Renal cell carcinoma ([place])    Wears dentures    full upper and lower    Micro Data:  8/13: Blood culture x2>> 8/13: Urine>> 8/13: Tracheal aspirate>> 8/13: Strep pneumo urinary antigen>>negative  8/13: Legionella urinary antigen>>  Antimicrobials:  Cefepime x1 dose 8/13 Vancomycin x1 dose 8/13 Azithromycin 8/13>>08/14 Zosyn 8/13>>  Significant Hospital Events: Including procedures, antibiotic start and stop dates in addition to other pertinent events   8/13: Presents to ED following out of hospital cardiac arrest.  Intubated by ED.  PCCM asked to admit. 8/14: Pt awake and following commands will proceed with SBT with plans for possible extubation   Interim History / Subjective:  No acute events overnight.  Following commands sedated with propofol gtt   Objective   Blood pressure 92/64, pulse 70, temperature 98.8 F (37.1 C), resp. rate 20, height 6' (1.829 m), weight 101.7 kg, SpO2 96 %.    Vent Mode: PRVC FiO2 (%):  [40 %-100 %] 40 % Set Rate:  [16 bmp-20  bmp] 20 bmp Vt Set:  [550 mL] 550 mL PEEP:  [5 cmH20-10 cmH20] 8 cmH20 Plateau Pressure:  [18 cmH20] 18 cmH20   Intake/Output Summary (Last 24 hours) at 04/09/2022 6712 Last data filed at 04/09/2022 0600 Gross per 24 hour  Intake 2150.71 ml  Output 1500 ml  Net 650.71 ml   Filed Weights   04/08/22 1338 04/08/22 1501 04/09/22 0409  Weight: 118.8 kg 101.7 kg 101.7 kg    Examination: General: Acute on chronically ill-appearing male, NAD mechanically intubated  HENT: Supple, no JVD Lungs: Faint rhonchi throughout, even, non labored  Cardiovascular: Sinus rhythm, rrr, no M/R/G, 2+ radial/2+ distal pulses, no edema  Abdomen: +BS x4, obese, soft, non tender, non distended  Extremities: Normal bulk and tone, moves all extremities  Neuro: Lightly sedated, following commands, PERRL GU: Foley catheter in place draining yellow urine   Resolved Hospital Problem list   Metabolic Acidosis   Assessment & Plan:   Out of Hospital Cardiac Arrest (asystole), currently unclear etiology Elevated Troponin in setting of demand ischemia vs NSTEMI PMHx: Hypertension - Continuous cardiac monitoring - Echo pending  - Trend troponin's until peaked  - Cardiology consulted appreciate input  - Lipid panel pending   Acute Hypoxic & Hypercapnic Respiratory Failure in setting of Cardiac arrest & presumed Aspiration Pneumonia vs CAP Fractured ribs secondary to CPR  - Full vent support, implement lung protective strategies - Plateau pressures less than 30 cm H20 - Wean FiO2 & PEEP as tolerated to maintain  O2 sats >92% - Follow intermittent Chest X-ray & ABG as needed - Will perform SBT for possible extubation  - Once extubated will need aggressive pulmonary hygiene   Aspiration Pneumonia vs CAP - Trend WBC and monitor fever curve - Trend PCT  - Follow cultures  - Continue zosyn   AKI on CKD Stage IV secondary to ATN  PMHx: Renal cell carcinoma s/p right nephrectomy in 07/2019 with current  palliative chemotherapy and Cabozantinib Baseline Creatinine 3.13 with GFR 20 in March 2023 - Trend BMP  - Replace electrolytes as indicated  - Monitor UOP   Anemia of Chronic Kidney Disease - Monitor for S/Sx of bleeding - Trend CBC - Transfuse for Hgb <7  Acute Metabolic Encephalopathy~improving  PMHx: Schizoaffective disorder - Avoid sedating medication when able  - Provide supportive care   Diabetes Mellitus -CBG's q4h; Target range of 140 to 180 -SSI -Follow ICU Hypo/Hyperglycemia protocol  Best Practice (right click and "Reselect all SmartList Selections" daily)   Diet/type: NPO DVT prophylaxis: prophylactic heparin  GI prophylaxis: PPI Lines: N/A Foley:  Yes will place orders to discontinue  Code Status:  full code Last date of multidisciplinary goals of care discussion [04/09/2022]  Labs   CBC: Recent Labs  Lab 04/08/22 1108 04/09/22 0213  WBC 10.3 12.1*  NEUTROABS 5.6  --   HGB 11.6* 11.3*  HCT 35.0* 32.1*  MCV 99.7 93.6  PLT 219 628    Basic Metabolic Panel: Recent Labs  Lab 04/08/22 1108 04/09/22 0213  NA 143 143  K 3.8 3.8  CL 112* 113*  CO2 21* 19*  GLUCOSE 145* 94  BUN 27* 37*  CREATININE 3.94* 3.74*  CALCIUM 9.3 8.5*  MG 1.7 1.8  PHOS 5.8* 5.2*   GFR: Estimated Creatinine Clearance: 22 mL/min (A) (by C-G formula based on SCr of 3.74 mg/dL (H)). Recent Labs  Lab 04/08/22 1108 04/08/22 1109 04/08/22 1325 04/08/22 1326 04/08/22 1601 04/08/22 1801 04/09/22 0213  PROCALCITON  --   --  <0.10  --   --   --  37.25  WBC 10.3  --   --   --   --   --  12.1*  LATICACIDVEN  --  5.6*  --  2.5* 2.9* 1.5  --     Liver Function Tests: Recent Labs  Lab 04/08/22 1108 04/09/22 0213  AST 60* 137*  ALT 37 67*  ALKPHOS 55 36*  BILITOT 0.8 0.7  PROT 6.1* 5.6*  ALBUMIN 3.0* 2.8*   No results for input(s): "LIPASE", "AMYLASE" in the last 168 hours. No results for input(s): "AMMONIA" in the last 168 hours.  ABG    Component Value  Date/Time   PHART 7.35 04/08/2022 1730   PCO2ART 38 04/08/2022 1730   PO2ART 55 (L) 04/08/2022 1730   HCO3 21.0 04/08/2022 1730   TCO2 26 10/27/2013 2250   ACIDBASEDEF 4.2 (H) 04/08/2022 1730   O2SAT 87.9 04/08/2022 1730     Coagulation Profile: Recent Labs  Lab 04/08/22 1108  INR 1.4*    Cardiac Enzymes: No results for input(s): "CKTOTAL", "CKMB", "CKMBINDEX", "TROPONINI" in the last 168 hours.  HbA1C: Hgb A1c MFr Bld  Date/Time Value Ref Range Status  04/09/2022 02:13 AM 5.7 (H) 4.8 - 5.6 % Final    Comment:    (NOTE) Pre diabetes:          5.7%-6.4%  Diabetes:              >6.4%  Glycemic control for   <  7.0% adults with diabetes   08/10/2019 11:00 AM 6.9 (H) 4.8 - 5.6 % Final    Comment:    REPEATED TO VERIFY (NOTE) Pre diabetes:          5.7%-6.4% Diabetes:              >6.4% Glycemic control for   <7.0% adults with diabetes     CBG: Recent Labs  Lab 04/08/22 1047 04/08/22 1459 04/08/22 1957 04/08/22 2355 04/09/22 0339  GLUCAP 116* 110* 108* 104* 95    Review of Systems:   Unable to assess due to intubation/sedation/AMS/critical illness   Past Medical History:  He,  has a past medical history of Chronic kidney disease, Diabetes mellitus without complication ([place]), Hypertension, Mental disorder, Renal cell carcinoma ([place]), and Wears dentures.   Surgical History:   Past Surgical History:  Procedure Laterality Date   ANKLE FRACTURE SURGERY     CATARACT EXTRACTION W/PHACO Left 03/08/2021   Procedure: CATARACT EXTRACTION PHACO AND INTRAOCULAR LENS PLACEMENT (IOC) LEFT DIABETIC 8.48 01:06.9;  Surgeon: Leandrew Koyanagi, MD;  Location: [place];  Service: Ophthalmology;  Laterality: Left;  Diabetic - insulin   CATARACT EXTRACTION W/PHACO Right 03/22/2021   Procedure: CATARACT EXTRACTION PHACO AND INTRAOCULAR LENS PLACEMENT (IOC) RIGHT DIABETIC 6.14 01:08.8;  Surgeon: Leandrew Koyanagi, MD;  Location: [place];  Service:  Ophthalmology;  Laterality: Right;  Diabetic - insulin   COLONOSCOPY WITH PROPOFOL N/A 04/15/2020   Procedure: COLONOSCOPY WITH PROPOFOL;  Surgeon: Virgel Manifold, MD;  Location: [facility] END[facility]COPY;  Service: Endoscopy;  Laterality: N/A;   CRANIOTOMY Left 10/27/2013   Procedure: Craniotomy for Aneurysm Clipping;  Surgeon: Consuella Lose, MD;  Location: [place] [facility];  Service: Neurosurgery;  Laterality: Left;   IR RADIOLOGIST EVAL & MGMT  11/22/2020   IR RADIOLOGIST EVAL & MGMT  12/21/2020   KNEE SURGERY     due to fracture.    ROBOTIC ASSITED PARTIAL NEPHRECTOMY Left 08/10/2019   Procedure: XI ROBOTIC ASSITED LAPAROSCOPIC  PARTIAL NEPHRECTOMY;  Surgeon: Raynelle Bring, MD;  Location: [facility];  Service: Urology;  Laterality: Left;     Social History:   reports that he has been smoking cigarettes. He has a 48.00 pack-year smoking history. He has never used smokeless tobacco. He reports that he does not currently use alcohol. He reports that he does not use drugs.   Family History:  His family history includes Breast cancer in his mother; Throat cancer in his brother. There is no history of Prostate cancer, Colon cancer, or Pancreatic cancer.   Allergies No Known Allergies   Home Medications  Prior to Admission medications   Medication Sig Start Date End Date Taking? Authorizing Provider  amLODipine-valsartan (EXFORGE) 10-320 MG tablet Take 1 tablet by mouth daily. 02/06/21  Yes [provider]  atorvastatin (LIPITOR) 10 MG tablet Take 10 mg by mouth daily. 12/28/20  Yes [provider]  cabozantinib (CABOMETYX) 40 MG tablet Take 1 tablet (40 mg total) by mouth daily. Take on an empty stomach, 1 hour before or 2 hours after meals. 01/30/22  Yes Earlie Server, MD  calcitRIOL (ROCALTROL) 0.25 MCG capsule Take 0.25 mcg by mouth daily. 03/26/22  Yes [provider]  calcium carbonate (OS-CAL) 600 MG TABS tablet Take 2 tablets (1,200 mg total) by mouth 2 (two) times daily with a  meal. 01/10/22  Yes Earlie Server, MD  carvedilol (COREG) 25 MG tablet Take 25 mg by mouth 2 (two) times daily. 07/16/19  Yes [provider]  ELIQUIS 2.5 MG TABS tablet TAKE 1 TABLET BY MOUTH TWICE A DAY 02/13/22  Yes Earlie Server, MD  fenofibrate (TRICOR) 145 MG tablet Take 145 mg by mouth daily.   Yes [provider]  Insulin Lispro Prot & Lispro (HUMALOG 75/25 MIX) (75-25) 100 UNIT/ML Kwikpen Inject 30 Units into the skin every morning. 04/10/21  Yes [provider]  lamoTRIgine (LAMICTAL) 100 MG tablet Take 100 mg by mouth 2 (two) times daily.   Yes [provider]  minoxidil (LONITEN) 2.5 MG tablet Take 2.5 mg by mouth daily.   Yes [provider]  olmesartan (BENICAR) 40 MG tablet Take 40 mg by mouth daily. 10/25/21  Yes [provider]  omeprazole (PRILOSEC) 40 MG capsule Take 40 mg by mouth daily. 10/05/20  Yes [provider]  tamsulosin (FLOMAX) 0.4 MG CAPS capsule Take 0.4 mg by mouth daily.  02/06/19  Yes [provider]  torsemide (DEMADEX) 20 MG tablet Take 20 mg by mouth daily. 07/24/21  Yes [provider]  Valbenazine Tosylate (INGREZZA) 40 MG CAPS Take 40 mg by mouth daily.   Yes [provider]  ziprasidone (GEODON) 80 MG capsule Take 160 mg by mouth at bedtime. 07/27/19  Yes [provider]  acetaminophen (TYLENOL) 650 MG CR tablet Take by mouth.    [provider]  DUREZOL 0.05 % EMUL Place 1 drop into the right eye 2 (two) times daily. Patient not taking: Reported on 08/03/2021 03/13/21   [provider]  furosemide (LASIX) 20 MG tablet Take 0.5 tablets (10 mg total) by mouth daily. Patient not taking: Reported on 04/08/2022 03/10/21   Earlie Server, MD  loperamide (IMODIUM A-D) 2 MG tablet Take 1 tablet (2 mg total) by mouth See admin instructions. Take '4mg'$  with the onset of diarrhea, then take '2mg'$  after every loose bowel movements. Maximum '16mg'$  per 24 hours. 03/10/21   Earlie Server, MD   NOVOLOG MIX 70/30 FLEXPEN (70-30) 100 UNIT/ML FlexPen Inject 30 Units as directed daily. Patient not taking: Reported on 08/03/2021 06/24/19   [provider]  olmesartan (BENICAR) 20 MG tablet Take 20 mg by mouth daily. 1 Tablet(s) By Mouth Daily Patient not taking: Reported on 04/08/2022 06/19/21   [provider]  ondansetron (ZOFRAN) 8 MG tablet TAKE 1 TABLET BY MOUTH EVERY 8 HOURS AS NEEDED FOR NAUSEA OR VOMITING 01/09/22   Darl Pikes, RPH-CPP  ULTICARE PEN NEEDLES 29G X 12.7MM MISC  11/02/19   [provider]     Critical care time: 46 minutes    Donell Beers, [place] Pager [phone] (please enter 7 digits) PCCM Consult Pager [phone] (please enter 7 digits)

## 2022-04-09 NOTE — Progress Notes (Addendum)
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  Intubated 8/13, extubated 8/14  Goal of Therapy:  K > 4 Mg > 2 All other electrolytes within normal limits  Plan:  --K 3.8, Kcl 10 mEq IV x 2 --Mg 1.8, magnesium sulfate 2 g IV x 1 --Follow-up electrolytes with AM labs tomorrow  Benita Gutter 04/09/2022 1:40 PM

## 2022-04-09 NOTE — Consult Note (Signed)
Cardiology Consultation:   Patient ID: Riley Stuart MRN: [id number]; DOB: 1950-10-21  Admit date: 04/08/2022 Date of Consult: 04/09/2022  PCP:  Jodi Marble, MD   [name] Providers Cardiologist:  Nelva Bush, MD        Patient Profile:   Riley Stuart is a 71 y.o. male with a hx of chronic kidney disease stage IV, diabetes, hypertension, renal cell carcinoma on oral chemotherapy, schizoaffective disorder, h/o of PE, ruptured cerebral aneurysm with subarachnoid hemorrhage who is being seen 04/09/2022 for the evaluation of out-of-hospital cardiac arrest with unknown etiology at the request of Rodman Comp, NP.  History of Present Illness:   Riley Stuart is a 71 year old male with the above mentioned medical history of chronic kidney disease stage IV, diabetes, hypertension, renal cell carcinoma on oral chemotherapy, schizoaffective disorder who resides in a group home, history of pulmonary embolism on chronic anticoagulation of apaxiban 2.5 mg bid, history of ruptured cerebral aneurysm with subarachnoid hemorrhage (2015) who presented to the [facility] emergency department on 04/08/2022 following out of hospital cardiac arrest.  Patient was subsequently intubated and sedated and there was no family available.  Per the emergency department records the patient resides in [place] group home where he was found unresponsive in a chair with left-sided facial drooping confirmed by staff member.  Patient did have pulse at that time.  His brother had previously talked him on the phone earlier in the morning and he seemed to be in his usual state of health.  The fire department noted he had a thready pulse and subsequently was pulseless.  EMS arrived at 0 950 initial rhythm was asystole, he received 4 rounds of epinephrine with ROSC after about 12 minutes.  EMS attempted to intubate with a King airway, but were unable due to gagging.  Patient again lost his pulse, initially  asystole and then V-fib for which he received 300 mg of amiodarone time and 1 defibrillation.  Upon arrival to the [facility] emergency department he remained pulseless and in asystole.  He received another several rounds of CPR and epinephrine.  He was successfully intubated by the ER provider, who noted copious secretions.  ROSC was briefly obtained within the lost pulse again.  Total estimated time of ACLS is approximately 50 minutes while in the emergency department he received a total of 4 mg of epinephrine, 1 g of calcium, and 1 amp of sodium bicarb.  Once ROSC was obtained he did begin gagging on the ET tube and subsequently had to be started on low-dose propofol.  The ED provider did note continued left facial droop and that he was not responding to painful stimuli.  Initial vitals: blood pressure 127/100, pulse 72, respirations of 19, SPO2 93%  Pertinent labs: Chloride 112, CO2 21, glucose 145, BUN 27, creatinine 3.94, hemoglobin 11.6, lactic acid 5.6, high-sensitivity troponin 95  Medications given in the emergency department: Normal saline 1 L, norepinephrine drip, cefepime 2 g IVPB, vancomycin 2 g IV PV, propofol   Past Medical History:  Diagnosis Date   Chronic kidney disease    renal insufficiency   Diabetes mellitus without complication ([name])    Pt takes Insulin   Hypertension    Mental disorder    schizoaffective   Renal cell carcinoma ([name])    Wears dentures    full upper and lower    Past Surgical History:  Procedure Laterality Date   ANKLE FRACTURE SURGERY     CATARACT EXTRACTION W/PHACO Left 03/08/2021  Procedure: CATARACT EXTRACTION PHACO AND INTRAOCULAR LENS PLACEMENT ([employer]) LEFT DIABETIC 8.48 01:06.9;  Surgeon: Leandrew Koyanagi, MD;  Location: [place];  Service: Ophthalmology;  Laterality: Left;  Diabetic - insulin   CATARACT EXTRACTION W/PHACO Right 03/22/2021   Procedure: CATARACT EXTRACTION PHACO AND INTRAOCULAR LENS PLACEMENT ([facility]) RIGHT DIABETIC 6.14  01:08.8;  Surgeon: Leandrew Koyanagi, MD;  Location: [place];  Service: Ophthalmology;  Laterality: Right;  Diabetic - insulin   COLONOSCOPY WITH PROPOFOL N/A 04/15/2020   Procedure: COLONOSCOPY WITH PROPOFOL;  Surgeon: Virgel Manifold, MD;  Location: [facility];  Service: Endoscopy;  Laterality: N/A;   CRANIOTOMY Left 10/27/2013   Procedure: Craniotomy for Aneurysm Clipping;  Surgeon: Consuella Lose, MD;  Location: [place] [facility];  Service: Neurosurgery;  Laterality: Left;   IR RADIOLOGIST EVAL & MGMT  11/22/2020   IR RADIOLOGIST EVAL & MGMT  12/21/2020   KNEE SURGERY     due to fracture.    ROBOTIC ASSITED PARTIAL NEPHRECTOMY Left 08/10/2019   Procedure: XI ROBOTIC ASSITED LAPAROSCOPIC  PARTIAL NEPHRECTOMY;  Surgeon: Raynelle Bring, MD;  Location: [facility];  Service: Urology;  Laterality: Left;     Home Medications:  Prior to Admission medications   Medication Sig Start Date End Date Taking? Authorizing Provider  amLODipine-valsartan (EXFORGE) 10-320 MG tablet Take 1 tablet by mouth daily. 02/06/21  Yes [provider]  atorvastatin (LIPITOR) 10 MG tablet Take 10 mg by mouth daily. 12/28/20  Yes [provider]  cabozantinib (CABOMETYX) 40 MG tablet Take 1 tablet (40 mg total) by mouth daily. Take on an empty stomach, 1 hour before or 2 hours after meals. 01/30/22  Yes Earlie Server, MD  calcitRIOL (ROCALTROL) 0.25 MCG capsule Take 0.25 mcg by mouth daily. 03/26/22  Yes [provider]  calcium carbonate (OS-CAL) 600 MG TABS tablet Take 2 tablets (1,200 mg total) by mouth 2 (two) times daily with a meal. 01/10/22  Yes Earlie Server, MD  carvedilol (COREG) 25 MG tablet Take 25 mg by mouth 2 (two) times daily. 07/16/19  Yes [provider]  ELIQUIS 2.5 MG TABS tablet TAKE 1 TABLET BY MOUTH TWICE A DAY 02/13/22  Yes Earlie Server, MD  fenofibrate (TRICOR) 145 MG tablet Take 145 mg by mouth daily.   Yes [provider]  Insulin Lispro Prot & Lispro  (HUMALOG 75/25 MIX) (75-25) 100 UNIT/ML Kwikpen Inject 30 Units into the skin every morning. 04/10/21  Yes [provider]  lamoTRIgine (LAMICTAL) 100 MG tablet Take 100 mg by mouth 2 (two) times daily.   Yes [provider]  minoxidil (LONITEN) 2.5 MG tablet Take 2.5 mg by mouth daily.   Yes [provider]  olmesartan (BENICAR) 40 MG tablet Take 40 mg by mouth daily. 10/25/21  Yes [provider]  omeprazole (PRILOSEC) 40 MG capsule Take 40 mg by mouth daily. 10/05/20  Yes [provider]  tamsulosin (FLOMAX) 0.4 MG CAPS capsule Take 0.4 mg by mouth daily.  02/06/19  Yes [provider]  torsemide (DEMADEX) 20 MG tablet Take 20 mg by mouth daily. 07/24/21  Yes [provider]  Valbenazine Tosylate (INGREZZA) 40 MG CAPS Take 40 mg by mouth daily.   Yes [provider]  ziprasidone (GEODON) 80 MG capsule Take 160 mg by mouth at bedtime. 07/27/19  Yes [provider]  DUREZOL 0.05 % EMUL Place 1 drop into the right eye 2 (two) times daily. Patient not taking: Reported on 08/03/2021 03/13/21   [provider]  loperamide (IMODIUM A-D) 2 MG tablet Take 1 tablet (2 mg total) by mouth See admin instructions. Take '4mg'$  with the onset of diarrhea, then take '2mg'$  after every loose bowel movements. Maximum '16mg'$  per 24 hours. 03/10/21   Earlie Server, MD  NOVOLOG MIX 70/30 FLEXPEN (70-30) 100 UNIT/ML FlexPen Inject 30 Units as directed daily. Patient not taking: Reported on 08/03/2021 06/24/19   [provider]  ondansetron (ZOFRAN) 8 MG tablet TAKE 1 TABLET BY MOUTH EVERY 8 HOURS AS NEEDED FOR NAUSEA OR VOMITING 01/09/22   Darl Pikes, RPH-CPP  ULTICARE PEN NEEDLES 29G X 12.7MM MISC  11/02/19   [provider]    Inpatient Medications: Scheduled Meds:  Chlorhexidine Gluconate Cloth  6 each Topical Q0600   insulin aspart  0-9 Units Subcutaneous Q4H   mouth rinse  15 mL Mouth Rinse QID   pantoprazole (PROTONIX)  IV  40 mg Intravenous Daily   Continuous Infusions:  sodium chloride Stopped (04/08/22 1515)   heparin 1,000 Units/hr (04/09/22 0800)   magnesium sulfate bolus IVPB     norepinephrine (LEVOPHED) Adult infusion     piperacillin-tazobactam (ZOSYN)  IV 12.5 mL/hr at 04/09/22 0800   potassium chloride     PRN Meds: docusate sodium, ipratropium-albuterol, mouth rinse, polyethylene glycol  Allergies:   No Known Allergies  Social History:   Social History   Socioeconomic History   Marital status: Single    Spouse name: Not on file   Number of children: 0   Years of education: Not on file   Highest education level: Not on file  Occupational History   Not on file  Tobacco Use   Smoking status: Every Day    Packs/day: 1.00    Years: 48.00    Total pack years: 48.00    Types: Cigarettes   Smokeless tobacco: Never  Vaping Use   Vaping Use: Never used  Substance and Sexual Activity   Alcohol use: Not Currently   Drug use: Never   Sexual activity: Not Currently  Other Topics Concern   Not on file  Social History Narrative   Not on file   Social Determinants of Health   Financial Resource Strain: Not on file  Food Insecurity: Not on file  Transportation Needs: Not on file  Physical Activity: Not on file  Stress: Not on file  Social Connections: Not on file  Intimate Partner Violence: Not on file    Family History:    Family History  Problem Relation Age of Onset   Breast cancer Mother    Throat cancer Brother    Prostate cancer Neg Hx    Colon cancer Neg Hx    Pancreatic cancer Neg Hx      ROS:  Please see the history of present illness.  Review of Systems  All other systems reviewed and are negative.   All other ROS reviewed and negative.     Physical Exam/Data:   Vitals:   04/09/22 0600 04/09/22 0725 04/09/22 0728 04/09/22 0800  BP: 92/64   138/76  Pulse: 70 71  80  Resp: 20 (!) 21  16  Temp: 98.8 F (37.1 C)   97.9 F (36.6 C)  TempSrc:     Bladder  SpO2: 96% 100% 100% 90%  Weight:      Height:        Intake/Output Summary (Last 24 hours) at 04/09/2022 0903 Last data filed at 04/09/2022 0800 Gross per 24 hour  Intake 2234.24 ml  Output 1500 ml  Net 734.24 ml      04/09/2022    4:09 AM 04/08/2022    3:01 PM 04/08/2022    1:38 PM  Last 3 Weights  Weight (lbs) 224 lb 3.3 oz 224 lb 3.3 oz 262 lb  Weight (kg) 101.7 kg 101.7 kg 118.842 kg     Body mass index is 30.41 kg/m.  General:  Well nourished, well developed, in no acute distress, recently extubated HEENT: normal, likely tardive dyskinesia with the tongue with mild facial droop Neck:  no JVD Vascular: No carotid bruits; Distal pulses 2+ bilaterally Cardiac:  normal S1, S2; RRR; no murmur  Lungs:  coarse to auscultation bilaterally, expiratory wheezing, without rhonchi or rales, respirations are unlabored at rest on 4L O2 via [place] Abd: soft, nontender, no hepatomegaly  Ext: trace edema Musculoskeletal:  No deformities, BUE and BLE strength normal and equal Skin: warm and dry  Neuro:  CNs 2-12 intact, no focal abnormalities noted Psych:  Normal affect   EKG:  The EKG was personally reviewed and demonstrates:  sinus rate of 80 with LBBB, LAD, diffuse ST depression and T wave inversions Telemetry:  Telemetry was personally reviewed and demonstrates:  sinus rate of 90 with LBBB  Relevant CV Studies: Echocardiogram ordered and pending  Laboratory Data:  High Sensitivity Troponin:   Recent Labs  Lab 04/08/22 1108 04/08/22 1325 04/08/22 1601 04/08/22 1801 04/09/22 0213  TROPONINIHS 95* 535* 913* 1,131* 716*     Chemistry Recent Labs  Lab 04/08/22 1108 04/09/22 0213  NA 143 143  K 3.8 3.8  CL 112* 113*  CO2 21* 19*  GLUCOSE 145* 94  BUN 27* 37*  CREATININE 3.94* 3.74*  CALCIUM 9.3 8.5*  MG 1.7 1.8  GFRNONAA 15* 16*  ANIONGAP 10 11    Recent Labs  Lab 04/08/22 1108 04/09/22 0213  PROT 6.1* 5.6*  ALBUMIN 3.0* 2.8*  AST 60* 137*  ALT 37 67*   ALKPHOS 55 36*  BILITOT 0.8 0.7   Lipids  Recent Labs  Lab 04/09/22 0213  CHOL 86  TRIG 114  110  HDL 26*  LDLCALC 37  CHOLHDL 3.3    Hematology Recent Labs  Lab 04/08/22 1108 04/09/22 0213  WBC 10.3 12.1*  RBC 3.51* 3.43*  HGB 11.6* 11.3*  HCT 35.0* 32.1*  MCV 99.7 93.6  MCH 33.0 32.9  MCHC 33.1 35.2  RDW 17.9* 17.1*  PLT 219 206   Thyroid No results for input(s): "TSH", "FREET4" in the last 168 hours.  BNP Recent Labs  Lab 04/08/22 1325  BNP 125.4*    DDimer No results for input(s): "DDIMER" in the last 168 hours.   Radiology/Studies:  CT Angio Chest PE W and/or Wo Contrast  Result Date: 04/08/2022 CLINICAL DATA:  Pulmonary embolism (PE) suspected, high prob. CPR. History of renal cell carcinoma EXAM: CT ANGIOGRAPHY CHEST WITH CONTRAST TECHNIQUE: Multidetector CT imaging of the chest was performed using the standard protocol during bolus administration of intravenous contrast. Multiplanar CT image reconstructions and MIPs were obtained to evaluate the vascular anatomy. RADIATION DOSE REDUCTION: This exam was performed according to the departmental dose-optimization program which includes automated exposure control, adjustment of the mA and/or kV according to patient size and/or use of iterative reconstruction technique. CONTRAST:  82m OMNIPAQUE IOHEXOL 350 MG/ML SOLN COMPARISON:  Same day CT chest 1143 hours, 02/19/2022 FINDINGS: Cardiovascular: Good contrast bolus timing with satisfactory opacification of the central pulmonary arteries. The segmental and subsegmental branch pulmonary arteries are  suboptimally evaluated secondary to respiratory motion artifact. Within this limitation, there is no evidence of pulmonary embolism to the lobar branch level. Thoracic aorta is nonaneurysmal. Scattered atherosclerotic vascular calcifications of the aorta and coronary arteries. Heart size is within normal limits. Trace pericardial fluid. Mediastinum/Nodes: No axillary,  mediastinal, or hilar lymphadenopathy. Endotracheal and enteric tubes remain appropriately positioned. Lungs/Pleura: Moderate emphysema. Slight worsening of dependent airspace consolidation within the right upper and right lower lobes. Similar degree of dependent opacity in the left lower lobe. Areas of nodular and band-like scarring in the periphery of the left upper lobe and right middle lobes are unchanged. No pneumothorax. No significant pleural effusion. Upper Abdomen: Rounded low-density lesions within the liver, not appreciably changed from 02/19/2022. No acute abnormality within the upper abdomen. Musculoskeletal: Redemonstrated bilateral rib fractures, the left fourth and fifth ribs being mildly displaced. Remaining fractures are otherwise nondisplaced. No subcutaneous emphysema. No chest wall hematoma. Nondisplaced sternal body fracture. Thoracic vertebral body heights are maintained. Review of the MIP images confirms the above findings. IMPRESSION: 1. No evidence of pulmonary embolism to the lobar branch level. 2. Slight worsening of dependent airspace consolidation within the right upper and right lower lobes. 3. Otherwise, no significant interval change in the appearance of the chest compared to recent previous exam. 4. Redemonstrated bilateral rib fractures.  No pneumothorax. Aortic Atherosclerosis (ICD10-I70.0) and Emphysema (ICD10-J43.9). Electronically Signed   By: Davina Poke D.O.   On: 04/08/2022 13:26   CT Chest Wo Contrast  Result Date: 04/08/2022 CLINICAL DATA:  71 year old male found unresponsive, CPR in progress. Left facial droop. History of left renal cell carcinoma and partial nephrectomy. EXAM: CT CHEST WITHOUT CONTRAST TECHNIQUE: Multidetector CT imaging of the chest was performed following the standard protocol without IV contrast. RADIATION DOSE REDUCTION: This exam was performed according to the departmental dose-optimization program which includes automated exposure control,  adjustment of the mA and/or kV according to patient size and/or use of iterative reconstruction technique. COMPARISON:  Cervical spine CT today. Restaging noncontrast Chest CT 02/19/2022. FINDINGS: Cardiovascular: Calcified coronary artery atherosclerosis. Calcified aortic atherosclerosis. Mild cardiomegaly appears stable. No pericardial effusion. Vascular patency is not evaluated in the absence of IV contrast. Mediastinum/Nodes: Gas distended proximal thoracic esophagus containing enteric tube which continues to the abdomen. The mid and distal thoracic esophagus are decompressed. No mediastinal mass or lymphadenopathy identified. Lungs/Pleura: Intubated. ETT tip in good position above the carina. Major airways remain patent. Dependent right upper and lower lung consolidation is extensive. Underlying emphysema. Chronic bilateral middle lobe opacity/scarring along major fissures on series 3, image 84 is unchanged. Trace if any layering pleural fluid. No pneumothorax. Upper Abdomen: Enteric tube continues into the body of the stomach. No free air or free fluid in the visible upper abdomen. Stable visible noncontrast liver, spleen, adrenal glands and right kidney. Musculoskeletal: Left side 2 through 7 anterolateral rib fractures, some mildly displaced (series 3, image 59). Right side 2 through 7 anterior rib fractures, several at the costochondral junctions. Nondisplaced mid sternal fracture (series 5, image 32). Trace overlying subcutaneous hematoma or contusion. Thoracic vertebrae appear stable. No suspicious osseous lesion. IMPRESSION: 1. Extensive dependent lung consolidation right > left. Consider aspiration, pneumonia. Trace if any layering pleural fluid. Underlying Emphysema (ICD10-J43.9). 2. CPR related bilateral 2 through 7 rib fractures, central sternal fracture. 3. ETT and visible enteric tube in good position. 4. Calcified coronary artery, Aortic Atherosclerosis (ICD10-I70.0). Stable mild cardiomegaly.  Electronically Signed   By: Genevie Ann M.D.   On:  04/08/2022 12:07   CT Cervical Spine Wo Contrast  Result Date: 04/08/2022 CLINICAL DATA:  71 year old male found unresponsive, CPR in progress. Left facial droop. EXAM: CT CERVICAL SPINE WITHOUT CONTRAST TECHNIQUE: Multidetector CT imaging of the cervical spine was performed without intravenous contrast. Multiplanar CT image reconstructions were also generated. RADIATION DOSE REDUCTION: This exam was performed according to the departmental dose-optimization program which includes automated exposure control, adjustment of the mA and/or kV according to patient size and/or use of iterative reconstruction technique. COMPARISON:  Head CT today, 10/26/2013. FINDINGS: Alignment: Preserved cervical lordosis. Cervicothoracic junction alignment is within normal limits. Bilateral posterior element alignment is within normal limits. Skull base and vertebrae: Visualized skull base is intact. No atlanto-occipital dissociation. C1 and C2 appear intact and aligned. An oval sclerotic focus in the right clivus has been present since 2015, benign. No acute osseous abnormality identified. Soft tissues and spinal canal: Visible right nasoenteric and endotracheal tubes course appropriately into the airway and esophagus. Disc levels: Mild for age cervical spine degeneration, mostly right side facet arthropathy. Upper chest: Partially visible dependent consolidation in the right lung. Mildly gas distended proximal thoracic esophagus. Other: Absent dentition. IMPRESSION: 1. No acute traumatic injury identified in the cervical spine. 2. Satisfactory course of visible right nasoenteric and endotracheal tubes. 3. Partially visible dependent consolidation in the right lung. Electronically Signed   By: Genevie Ann M.D.   On: 04/08/2022 12:01   CT HEAD WO CONTRAST (5MM)  Result Date: 04/08/2022 CLINICAL DATA:  71 year old male found unresponsive, CPR in progress. Left facial droop. EXAM: CT HEAD  WITHOUT CONTRAST TECHNIQUE: Contiguous axial images were obtained from the base of the skull through the vertex without intravenous contrast. RADIATION DOSE REDUCTION: This exam was performed according to the departmental dose-optimization program which includes automated exposure control, adjustment of the mA and/or kV according to patient size and/or use of iterative reconstruction technique. COMPARISON:  Head CT 10/26/2013. FINDINGS: Brain: Bilateral inferior frontal gyrus encephalomalacia, interval left frontotemporal craniotomy. No midline shift, ventriculomegaly, mass effect, evidence of mass lesion, intracranial hemorrhage or evidence of cortically based acute infarction. Outside of the anterior frontal lobes gray-white matter differentiation is within normal limits. Vascular: Anterior communicating artery region aneurysm clip is new. No suspicious intracranial vascular hyperdensity. Skull: Left frontotemporal craniotomy since 2015. No acute osseous abnormality identified. Sinuses/Orbits: Maxillary alveolar recess mucosal thickening. Other Visualized paranasal sinuses and mastoids are stable and well aerated. Other: No acute orbit or scalp soft tissue finding identified. Right nasoenteric tube in place. Endotracheal tube partially visible in the pharynx. IMPRESSION: 1. No acute intracranial abnormality. 2. 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 Associated inferior frontal gyrus encephalomalacia. Electronically Signed   By: Genevie Ann M.D.   On: 04/08/2022 11:59   DG Chest Portable 1 View  Result Date: 04/08/2022 CLINICAL DATA:  Shortness of breath. Status post CPR and intubation. EXAM: PORTABLE CHEST 1 VIEW COMPARISON:  05/12/2021 radiograph and 02/19/2022 CT FINDINGS: Endotracheal tube with tip 5 cm above the carina and enteric tube entering the stomach with tip off the field of view noted. Defibrillator/pacing pads overlying the LOWER chest noted. Diffuse  airspace opacities throughout the RIGHT lung noted. Interstitial/mild patchy opacities within the LEFT lung noted. There is no evidence of pleural effusion or pneumothorax. Several radiopaque structures overlying the chest are likely external to the patient but correlate clinically. There are fractures of the LEFT 4th through 7th ribs. IMPRESSION: 1. Fractures of the LEFT 4th through 7th  ribs. No evidence of pneumothorax. 2. Diffuse RIGHT lung airspace opacities and mild LEFT lung patchy opacities - question asymmetric edema, infection or aspiration. 3. Endotracheal tube with tip 5 cm above the carina and enteric tube entering the stomach with tip off the field of view. Electronically Signed   By: Margarette Canada M.D.   On: 04/08/2022 11:30     Assessment and Plan:   Out of hospital cardiac arrest with unknown etiology; Elevated troponins in the setting of  demand ischemia vs NSTEMI - ACLS time was approximately 50 minutes to obtain ROSC - Hs troponins trended 95, 535, 913, 1131, and 716 - continue heparin drip - Patient currently denies chest pain - EKG with chronic LBBB - Echocardiogram ordered and pending - currently off of pressors - no immediate plans for heart catheterization due to advanced CKD  Acute hypoxic/ hypercapnic respiratory failure/ Aspiration vs CAP  - extubated this morning without difficulty - currently on 4L O2 via [place] - titrate FiO2 to keep O2 sats greater than equal to 92% - antibiotics continued - continue pulmonary hygiene - daily cbc - management per CCM  AKI on CKD IV - serum creatinine 3.74 - serum creatinine on arrival 3.94 - baseline creatinine 2.9-3.1 - previously had received IVF, currently off - monitor I&O - monitor/trend/replace electrolytes as needed - avoid nephrotoxic agents as able - daily bmp  Diabetes - A1c 5.7 - continue insulin - CBG every 4 hours - management per CCM  5. Acute metabolic encephalopathy with history of schizoaffective  disoprder - improving - avoid sedating medications  - supportive care  6. H/o pulmonary embolism - currently on heparin drip - will need to he transitioned back to eliquis 2.5 mg bid prior to d/c - no active bleeding noted - CT of the chest negative for PE but did show consolidations in the RUL and RLL     Risk Assessment/Risk Scores:                For questions or updates, please contact [place] Please consult [email or web address] for contact info under    Signed, Fedra Lanter, NP  04/09/2022 9:03 AM

## 2022-04-09 NOTE — Progress Notes (Signed)
PCCM Update:  Called to bedside by RT due to concern of increased work of breathing and increased respiratory rate. He had completed a nebulizer treatment which seemed to help. He had audible upper airway secretions. No stridor noted. He had significant expiratory wheezing. He is a long time smoker, so possible for underlying COPD with exacerbation vs reactive ariways disease in setting of aspiration.   Plan: - continue care on progressive care unit - Start solumdrol '40mg'$  BID IV - Start budesonide/brovana/yupelri nebs - Will continue to follow closely and transfer back to step down unit if needed.  Freda Jackson, MD [facility] Office: [phone]

## 2022-04-09 NOTE — Evaluation (Signed)
Clinical/Bedside Swallow Evaluation Patient Details  Name: Riley Stuart MRN: [id number] Date of Birth: 01-16-1951  Today's Date: 04/09/2022 Time: SLP Start Time (ACUTE ONLY): 1300 SLP Stop Time (ACUTE ONLY): 1325 SLP Time Calculation (min) (ACUTE ONLY): 25 min  Past Medical History:  Past Medical History:  Diagnosis Date   Chronic kidney disease    renal insufficiency   Diabetes mellitus without complication ([place])    Pt takes Insulin   Hypertension    Mental disorder    schizoaffective   Renal cell carcinoma ([place])    Wears dentures    full upper and lower   Past Surgical History:  Past Surgical History:  Procedure Laterality Date   ANKLE FRACTURE SURGERY     CATARACT EXTRACTION W/PHACO Left 03/08/2021   Procedure: CATARACT EXTRACTION PHACO AND INTRAOCULAR LENS PLACEMENT ([place]) LEFT DIABETIC 8.48 01:06.9;  Surgeon: Leandrew Koyanagi, MD;  Location: [place];  Service: Ophthalmology;  Laterality: Left;  Diabetic - insulin   CATARACT EXTRACTION W/PHACO Right 03/22/2021   Procedure: CATARACT EXTRACTION PHACO AND INTRAOCULAR LENS PLACEMENT ([facility]) RIGHT DIABETIC 6.14 01:08.8;  Surgeon: Leandrew Koyanagi, MD;  Location: [place];  Service: Ophthalmology;  Laterality: Right;  Diabetic - insulin   COLONOSCOPY WITH PROPOFOL N/A 04/15/2020   Procedure: COLONOSCOPY WITH PROPOFOL;  Surgeon: Virgel Manifold, MD;  Location: [facility] ENDOSCOPY;  Service: Endoscopy;  Laterality: N/A;   CRANIOTOMY Left 10/27/2013   Procedure: Craniotomy for Aneurysm Clipping;  Surgeon: Consuella Lose, MD;  Location: [place] [facility];  Service: Neurosurgery;  Laterality: Left;   IR RADIOLOGIST EVAL & MGMT  11/22/2020   IR RADIOLOGIST EVAL & MGMT  12/21/2020   KNEE SURGERY     due to fracture.    ROBOTIC ASSITED PARTIAL NEPHRECTOMY Left 08/10/2019   Procedure: XI ROBOTIC ASSITED LAPAROSCOPIC  PARTIAL NEPHRECTOMY;  Surgeon: Raynelle Bring, MD;  Location: [facility];  Service: Urology;   Laterality: Left;   HPI:  Per H&P "Riley Stuart is a 70 year old male with a past medical history significant for ruptured cerebral aneurysm with subarachnoid hemorrhage in 2015, hypertension, diabetes mellitus, renal cell carcinoma on chemotherapy, and chronic kidney disease stage IV who presented to to [facility] ED on 04/08/2022 following out-of-hospital cardiac arrest.  Patient is currently intubated and sedated with no family available, therefore history is obtained from chart review.     Per ED and nursing notes, the patient resides at [facility] where he was found unresponsive in a chair with left sided facial drooping by staff member.  Patient did have pulses at that time.  His brother actually talked to on the phone earlier this morning, and seemed to be in his usually state of health.  Fire Department reported he had thready pulses, of which then he lost his pulse.  EMS arrived at 09:50, initial rhythm was asystole, he received 4 rounds of epinephrine with ROSC after about 12 minutes.  EMS attempted to intubate with [facility] airway, but were unable to due to gagging.  Patient again lost his pulse, initially asystole and then V-fib for which he received 300 mg of amiodarone and 1 defibrillation.  Upon arrival to the ED, remained pulseless (asystole).  He required another several rounds of CPR and epinephrine.  He was successfully intubated by ED provider, who noted copious secretions.  ROSC briefly obtained, but then again lost pulse  Total estimated time of ACLS is approximately 50 minutes, and while in ED received a total of 4 mg of epinephrine, 1g calcium,  and 1 amp bicarb.       Following ROSC, he was noted to be gagging on ETT, therefore low dose propofol started. EDP provider did note continued left facial droop and not responding to pain."    Assessment / Plan / Recommendation  Clinical Impression  Pt seen for clinical swallowing evaluation. Pt alert and cooperative. Extraneous lingual  movements noted at rest (?tardive dyskinesia). On 4L/min O2 via . Cleared with RN.  Oral motor examination completed and remarkable for extraneous lingual movements at rest and edentulism. Pt reports owning dentures and typically donning them to eat; however, dentures not present at this time.   Pt given trials of solid, pureed, and thin liquids (via straw). Pt presents with mild/moderate oral dysphagia c/b prolonged/inefficient mastication of solids. Oral deficits appear related to dental status. Pharyngeal swallow appeared [name] per clinical assessment. No overt or subtle s/sx pharyngeal dysphagia. No changes to vocal quality or vital signs appreciated. Pt able to feed self with set up.   Recommend initiation of a Dysphagia 2 (Chopped) Diet with Thin Liquids and safe swallowing strategies/aspiration precautions as outlined below.   Pt is at mildly increased risk for aspiration/aspiration PNA given dental status, respiratory statues, and medical comorbidities.   SLP to f/u per POC for diet tolerance and trials of upgraded textures (as appropriate; when dentures present).  SLP Visit Diagnosis: Dysphagia, oral phase (R13.11)    Aspiration Risk  Mild aspiration risk    Diet Recommendation Dysphagia 2 (Fine chop);Thin liquid   Medication Administration: Whole meds with puree (vs crushed) Supervision: Patient able to self feed;Full supervision/cueing for compensatory strategies (set up) Compensations: Minimize environmental distractions;Slow rate;Small sips/bites Postural Changes: Seated upright at 90 degrees;Remain upright for at least 30 minutes after po intake    Other  Recommendations Oral Care Recommendations: Oral care QID;Patient independent with oral care (set up)    Recommendations for follow up therapy are one component of a multi-disciplinary discharge planning process, led by the attending physician.  Recommendations may be updated based on patient status, additional functional  criteria and insurance authorization.  Follow up Recommendations  (TBD)      Assistance Recommended at Discharge Frequent or constant Supervision/Assistance  Functional Status Assessment Patient has had a recent decline in their functional status and demonstrates the ability to make significant improvements in function in a reasonable and predictable amount of time.  Frequency and Duration min 1 x/week  2 weeks       Prognosis Prognosis for Safe Diet Advancement: Fair Barriers to Reach Goals: Cognitive deficits (edentulisum)      Swallow Study   General Date of Onset: 04/08/22 HPI: Per H&P "Riley Stuart is a 71 year old male with a past medical history significant for ruptured cerebral aneurysm with subarachnoid hemorrhage in 2015, hypertension, diabetes mellitus, renal cell carcinoma on chemotherapy, and chronic kidney disease stage IV who presented to to [facility] ED on 04/08/2022 following out-of-hospital cardiac arrest.  Patient is currently intubated and sedated with no family available, therefore history is obtained from chart review.     Per ED and nursing notes, the patient resides at [facility] where he was found unresponsive in a chair with left sided facial drooping by staff member.  Patient did have pulses at that time.  His brother actually talked to on the phone earlier this morning, and seemed to be in his usually state of health.  Fire Department reported he had thready pulses, of which then he lost his pulse.  EMS  arrived at 09:50, initial rhythm was asystole, he received 4 rounds of epinephrine with ROSC after about 12 minutes.  EMS attempted to intubate with [facility] airway, but were unable to due to gagging.  Patient again lost his pulse, initially asystole and then V-fib for which he received 300 mg of amiodarone and 1 defibrillation.  Upon arrival to the ED, remained pulseless (asystole).  He required another several rounds of CPR and epinephrine.  He was successfully  intubated by ED provider, who noted copious secretions.  ROSC briefly obtained, but then again lost pulse  Total estimated time of ACLS is approximately 50 minutes, and while in ED received a total of 4 mg of epinephrine, 1g calcium, and 1 amp bicarb.       Following ROSC, he was noted to be gagging on ETT, therefore low dose propofol started. EDP provider did note continued left facial droop and not responding to pain." Type of Study: Bedside Swallow Evaluation Diet Prior to this Study: NPO Temperature Spikes Noted: Yes (current temp WNL) Respiratory Status: Nasal cannula (4L/min) History of Recent Intubation: Yes Length of Intubations (days): 2 days Date extubated: 04/09/22 Behavior/Cognition: Alert;Cooperative Oral Cavity Assessment: Within Functional Limits Oral Care Completed by SLP: Yes Oral Cavity - Dentition: Dentures, not available Vision: Functional for self-feeding Self-Feeding Abilities: Needs set up;Able to feed self Patient Positioning: Upright in bed Baseline Vocal Quality: Normal Volitional Cough: Strong Volitional Swallow: Able to elicit    Oral/Motor/Sensory Function Lingual ROM:  (extraneous lingual movements at rest, ?tardive dyskinesia; strength and ROM WFL)   Ice Chips Ice chips: Within functional limits Presentation: Self Fed;Spoon Other Comments: x5   Thin Liquid Thin Liquid: Within functional limits Presentation: Straw Other Comments: ~12 oz; via single and consecutive straw sips    Nectar Thick Nectar Thick Liquid: Not tested   Honey Thick Honey Thick Liquid: Not tested   Puree Puree: Within functional limits Presentation: Self Fed Other Comments: ~3 oz   Solid     Solid: Impaired Oral Phase Impairments: Impaired mastication Oral Phase Functional Implications: Impaired mastication Pharyngeal Phase Impairments:  (WFL) Other Comments: x1 saltine cracker presented in quarters     Cherrie Gauze, M.S., [facility] [phone] Riley Stuart)   Quintella Baton 04/09/2022,2:35 PM

## 2022-04-10 ENCOUNTER — Inpatient Hospital Stay: Payer: Medicare Other

## 2022-04-10 DIAGNOSIS — J189 Pneumonia, unspecified organism: Secondary | ICD-10-CM

## 2022-04-10 DIAGNOSIS — I469 Cardiac arrest, cause unspecified: Secondary | ICD-10-CM | POA: Diagnosis not present

## 2022-04-10 DIAGNOSIS — J9601 Acute respiratory failure with hypoxia: Secondary | ICD-10-CM | POA: Diagnosis not present

## 2022-04-10 DIAGNOSIS — N184 Chronic kidney disease, stage 4 (severe): Secondary | ICD-10-CM | POA: Diagnosis not present

## 2022-04-10 LAB — CBC
HCT: 29.7 % — ABNORMAL LOW (ref 39.0–52.0)
Hemoglobin: 10.4 g/dL — ABNORMAL LOW (ref 13.0–17.0)
MCH: 32.6 pg (ref 26.0–34.0)
MCHC: 35 g/dL (ref 30.0–36.0)
MCV: 93.1 fL (ref 80.0–100.0)
Platelets: 141 10*3/uL — ABNORMAL LOW (ref 150–400)
RBC: 3.19 MIL/uL — ABNORMAL LOW (ref 4.22–5.81)
RDW: 16.7 % — ABNORMAL HIGH (ref 11.5–15.5)
WBC: 12.4 10*3/uL — ABNORMAL HIGH (ref 4.0–10.5)
nRBC: 1.3 % — ABNORMAL HIGH (ref 0.0–0.2)

## 2022-04-10 LAB — PROCALCITONIN: Procalcitonin: 34.17 ng/mL

## 2022-04-10 LAB — BASIC METABOLIC PANEL
Anion gap: 11 (ref 5–15)
BUN: 42 mg/dL — ABNORMAL HIGH (ref 8–23)
CO2: 19 mmol/L — ABNORMAL LOW (ref 22–32)
Calcium: 8.6 mg/dL — ABNORMAL LOW (ref 8.9–10.3)
Chloride: 108 mmol/L (ref 98–111)
Creatinine, Ser: 4.09 mg/dL — ABNORMAL HIGH (ref 0.61–1.24)
GFR, Estimated: 15 mL/min — ABNORMAL LOW (ref >60)
Glucose, Bld: 187 mg/dL — ABNORMAL HIGH (ref 70–99)
Potassium: 3.8 mmol/L (ref 3.5–5.1)
Sodium: 138 mmol/L (ref 135–145)

## 2022-04-10 LAB — GLUCOSE, CAPILLARY
Glucose-Capillary: 165 mg/dL — ABNORMAL HIGH (ref 70–99)
Glucose-Capillary: 183 mg/dL — ABNORMAL HIGH (ref 70–99)
Glucose-Capillary: 184 mg/dL — ABNORMAL HIGH (ref 70–99)
Glucose-Capillary: 193 mg/dL — ABNORMAL HIGH (ref 70–99)
Glucose-Capillary: 193 mg/dL — ABNORMAL HIGH (ref 70–99)
Glucose-Capillary: 199 mg/dL — ABNORMAL HIGH (ref 70–99)
Glucose-Capillary: 212 mg/dL — ABNORMAL HIGH (ref 70–99)

## 2022-04-10 LAB — LEGIONELLA PNEUMOPHILA SEROGP 1 UR AG: L. pneumophila Serogp 1 Ur Ag: NEGATIVE

## 2022-04-10 LAB — THYROID PANEL WITH TSH
Free Thyroxine Index: 2.1 (ref 1.2–4.9)
T3 Uptake Ratio: 33 % (ref 24–39)
T4, Total: 6.5 ug/dL (ref 4.5–12.0)
TSH: 0.794 u[IU]/mL (ref 0.450–4.500)

## 2022-04-10 LAB — APTT
aPTT: 81 seconds — ABNORMAL HIGH (ref 24–36)
aPTT: 84 seconds — ABNORMAL HIGH (ref 24–36)

## 2022-04-10 LAB — HEPARIN LEVEL (UNFRACTIONATED): Heparin Unfractionated: 0.46 IU/mL (ref 0.30–0.70)

## 2022-04-10 LAB — MAGNESIUM: Magnesium: 2.1 mg/dL (ref 1.7–2.4)

## 2022-04-10 LAB — PHOSPHORUS: Phosphorus: 4.1 mg/dL (ref 2.5–4.6)

## 2022-04-10 MED ORDER — SODIUM CHLORIDE 0.9 % IV SOLN: INTRAVENOUS | Status: DC

## 2022-04-10 MED ORDER — POTASSIUM CHLORIDE 20 MEQ PO PACK
20.0000 meq | PACK | Freq: Once | ORAL | Status: AC
  Administered 2022-04-10: 20 meq via ORAL
  Filled 2022-04-10: qty 1

## 2022-04-10 MED ORDER — APIXABAN 2.5 MG PO TABS: 2.5000 mg | ORAL_TABLET | Freq: Two times a day (BID) | ORAL | Status: DC

## 2022-04-10 MED ORDER — METHYLPREDNISOLONE SODIUM SUCC 125 MG IJ SOLR
120.0000 mg | INTRAMUSCULAR | Status: DC
  Administered 2022-04-10 – 2022-04-12 (×3): 120 mg via INTRAVENOUS
  Filled 2022-04-10 (×3): qty 2

## 2022-04-10 MED ORDER — IPRATROPIUM-ALBUTEROL 0.5-2.5 (3) MG/3ML IN SOLN
3.0000 mL | RESPIRATORY_TRACT | Status: DC | PRN
  Administered 2022-04-14 – 2022-04-16 (×4): 3 mL via RESPIRATORY_TRACT
  Filled 2022-04-10 (×4): qty 3

## 2022-04-10 MED ORDER — AMLODIPINE BESYLATE 10 MG PO TABS
10.0000 mg | ORAL_TABLET | Freq: Every day | ORAL | Status: DC
  Administered 2022-04-10 – 2022-04-17 (×8): 10 mg via ORAL
  Filled 2022-04-10 (×8): qty 1

## 2022-04-10 MED ORDER — HEPARIN (PORCINE) 25000 UT/250ML-% IV SOLN: 700.0000 [IU]/h | INTRAVENOUS | Status: DC

## 2022-04-10 MED ORDER — MELATONIN 5 MG PO TABS: 5.0000 mg | ORAL_TABLET | Freq: Once | ORAL | Status: DC

## 2022-04-10 MED ORDER — MIDAZOLAM HCL 2 MG/2ML IJ SOLN
1.0000 mg | INTRAMUSCULAR | Status: DC | PRN
  Administered 2022-04-10 – 2022-04-16 (×4): 1 mg via INTRAVENOUS
  Filled 2022-04-10 (×4): qty 2

## 2022-04-10 MED ORDER — APIXABAN 2.5 MG PO TABS
2.5000 mg | ORAL_TABLET | Freq: Two times a day (BID) | ORAL | Status: DC
Start: 2022-04-10 — End: 2022-04-17
  Administered 2022-04-10 – 2022-04-17 (×14): 2.5 mg via ORAL
  Filled 2022-04-10 (×14): qty 1

## 2022-04-10 NOTE — Progress Notes (Signed)
PCCM Update:  Called to bedside due to concern of increased work of breathing. Patient reports he is breathing better than when he saw me around 8pm. He does have reduced air movement with expiratory wheezing on exam.   We will try CPAP/BIPAP therapy on the patient due to increased work of breathing. Duonebs PRN 14hrs and '40mg'$  solumedrol increased to q8hr.  Will have our day team check on him again later this morning.  Freda Jackson, MD [place] [facility] Office: [phone]   See Amion for personal pager PCCM on call pager [phone] until 7pm. Please call Elink 7p-7a. [phone]

## 2022-04-10 NOTE — Progress Notes (Signed)
Pt on [place] with order for renal U/S. Confirmed with department that U/S can be performed at bedside. Primary MD ok with U/S being performed at bedside. U/S department updated with request to perform bedside; tech aware.

## 2022-04-10 NOTE — Assessment & Plan Note (Addendum)
Most likely secondary to pneumonia (community acquired versus aspiration) Initially intubated but has been extubated and is currently on high flow nasal cannula 50%/50L. Seen by PCCM for evaluation of respiratory distress and started on systemic steroids and bronchodilator therapy. We will attempt to wean off oxygen as tolerated once acute illness improves

## 2022-04-10 NOTE — Progress Notes (Signed)
Neuro: alert and oriented to self, Resp: marked improvement of work of breathing on HFNC CV: afebrile, vital signs stable and difficult to obtain due to dyskinesia GIGU: external catheter in place and changed this shift, large BM upon arrival to unit Skin: intact, bruising and abrasion to chest following CRP/Code event Social: Brother came to visit this evening, questions and concerns addressed  Events: received patient from floor as a rapid response with increased work of breathing following an independent attempt to go to the bathroom. Patient was placed on HFNC and work of breathing has greatly improved since his arrival  Patient handed off to [employer] in the ICU

## 2022-04-10 NOTE — Assessment & Plan Note (Signed)
Place patient on strict aspiration precautions. Continue antibiotic therapy Speech therapy consult for swallow function evaluation once stable

## 2022-04-10 NOTE — Assessment & Plan Note (Addendum)
Patient with a history of stage IV chronic kidney disease now worsened and most likely related to contrast patient received for CT angio done on admission to rule out pulmonary embolism Serum creatinine is 4.09 above a baseline of 3.29. Noted to have mild metabolic acidosis. We will consult nephrology Obtain renal ultrasound Gentle IV fluid hydration Repeat renal parameters in a.m.

## 2022-04-10 NOTE — Progress Notes (Signed)
ANTICOAGULATION CONSULT NOTE  Pharmacy Consult for IV Heparin Indication: ACS, on apixaban PTA for Hx PE  Patient Measurements: Height: 6' (182.9 cm) Weight: 101.7 kg (224 lb 3.3 oz) IBW/kg (Calculated) : 77.6 Heparin Dosing Weight: 98.4  Labs: Recent Labs    04/08/22 1108 04/08/22 1325 04/08/22 1601 04/08/22 1801 04/08/22 1807 04/09/22 0213 04/09/22 1324 04/09/22 1543 04/10/22 0052  HGB 11.6*  --   --   --   --  11.3*  --   --   --   HCT 35.0*  --   --   --   --  32.1*  --   --   --   PLT 219  --   --   --   --  206  --   --   --   APTT 41*  --   --   --   --  >200* >200* 169* 81*  LABPROT 17.0*  --   --   --   --   --   --   --   --   INR 1.4*  --   --   --   --   --   --   --   --   HEPARINUNFRC  --   --   --   --  0.93*  --  >1.10*  --   --   CREATININE 3.94*  --   --   --   --  3.74*  --   --   --   TROPONINIHS 95*   < > 913* 1,131*  --  716*  --   --   --    < > = values in this interval not displayed.    Estimated Creatinine Clearance: 22 mL/min (A) (by C-G formula based on SCr of 3.74 mg/dL (H)).  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 Patient is currently on IV heparin given concern for acute coronary syndrome.  Goal of Therapy:  Heparin level 0.3-0.7 units/ml aPTT 66-102 seconds Monitor platelets by anticoagulation protocol: Yes   0814 0213 aPTT >200, supratherapeutic 0814 1324 aPTT >200, supratherapeutic (repeat below to  confirm) 0814 1543 aPTT 169, supratherapeutic 1000 > 700 un/hr 0815 0052 aPTT 81, therapeutic X 1   Plan:  0815 0052 aPTT = 81, therapeutic X 1 Will continue pt on current rate and recheck HL and aPTT in 8 hrs on 8/15 @ 0900.   Tyheem Boughner D  04/10/2022 1:28 AM

## 2022-04-10 NOTE — Progress Notes (Signed)
SLP Cancellation Note  Patient Details Name: Riley Stuart MRN: [id number] DOB: Nov 21, 1950   Cancelled treatment:       Reason Eval/Treat Not Completed: Medical issues which prohibited therapy;Patient not medically ready (chart reviewed) Tx session held d/t w/ rapid response and need for transfer back to the CCU. MD noted worsening respiratory distress overnight. Seen by PCCM and was placed on [place]. MD noted tachypneia during rounds then need to transfer pt back to CCU due to respiratory distress and concerns for reintubation.  ST services will f/u tomorrow w/ pt's status and ST POC.    Orinda Kenner, MS, CCC-SLP Speech Language Pathologist Rehab Services; [place] [phone] (ascom) Rishikesh Khachatryan 04/10/2022, 5:49 PM

## 2022-04-10 NOTE — Progress Notes (Signed)
Patient is confused and concerned about wallet and cell phone. Patient has been informed by staff and family member Tamela Oddi that these items will be brought to him tomorrow. Patient forget he is in hospital and tries to elope from bed. Patient has removed all medical equipment multiple times this shift. NP [place] is aware. Staff has been at the bedside to keep patient safe.

## 2022-04-10 NOTE — Progress Notes (Signed)
Progress Note   Patient: Riley Stuart LGX:[id number] DOB: [birth year] DOA: 04/08/2022     2 DOS: the patient was seen and examined on 04/10/2022   Brief hospital course: Riley Stuart is a 71 year old male with a past medical history significant for ruptured cerebral aneurysm with subarachnoid hemorrhage in 2015, hypertension, diabetes mellitus, renal cell carcinoma on chemotherapy, and chronic kidney disease stage IV who presented to to [name] ED on 04/08/2022 following out-of-hospital cardiac arrest.  Patient is currently intubated and sedated with no family available, therefore history is obtained from chart review.   Per ED and nursing notes, the patient resides at [facility] where he was found unresponsive in a chair with left sided facial drooping by staff member.  Patient did have pulses at that time.  His brother actually talked to on the phone earlier this morning, and seemed to be in his usually state of health.  Fire Department reported he had thready pulses, of which then he lost his pulse.  EMS arrived at 09:50, initial rhythm was asystole, he received 4 rounds of epinephrine with ROSC after about 12 minutes.  EMS attempted to intubate with [facility] airway, but were unable to due to gagging.  Patient again lost his pulse, initially asystole and then V-fib for which he received 300 mg of amiodarone and 1 defibrillation.  Upon arrival to the ED, remained pulseless (asystole).  He required another several rounds of CPR and epinephrine.  He was successfully intubated by ED provider, who noted copious secretions.  ROSC briefly obtained, but then again lost pulse  Total estimated time of ACLS is approximately 50 minutes, and while in ED received a total of 4 mg of epinephrine, 1g calcium, and 1 amp bicarb.     Following ROSC, he was noted to be gagging on ETT, therefore low dose propofol started. EDP provider did note continued left facial droop and not responding to pain.  08/15  -patient's care was transferred to [facility] and overnight noted to have worsening respiratory distress.  Seen by PCCM and was placed on [place]. Seen during rounds and noted to be tachypneic.  Patient denied any new complaints.  Transferred back to ICU due to respiratory distress and concerns for reintubation.     Assessment and Plan: * Cardiac arrest [facility]) Patient presented after cardiac arrest with initial rhythm of asystole and subsequent rhythms notable for ventricular tachycardia. Received a total of 50 minutes of CPR. Intubated in the emergency room on 04/08/22 and was extubated on 04/09/22. Transferred back to ICU on 815 due to worsening respiratory distress and is currently on HFNC at 50L/50%  Aspiration pneumonia of both lower lobes [facility]) Place patient on strict aspiration precautions. Continue antibiotic therapy Speech therapy consult for swallow function evaluation once stable  NSTEMI (non-ST elevated myocardial infarction) ([place]) Noted to have elevated troponins in the setting of demand ischemia versus non-ST elevation MI. Twelve-lead EKG with chronic left bundle branch block. Patient was on a heparin drip for 48 hours with goal to transition to Eliquis. Not a candidate for left heart cath due to stage IV chronic kidney disease. Patient had a 2D echocardiogram with normal LVEF of 60% Seen by cardiology, plans for ischemic work-up on hold since patient is a poor candidate for left heart cath due to chronic kidney disease with serum creatinine of 4.0. Continue aspirin and metoprolol  Community acquired pneumonia Patient noted to have consolidations in the right upper and right lower lobe concerning for possible aspiration. Was  initially intubated on admission and extubated 08/14 Continue IV antibiotic therapy with Zosyn adjusted to renal function. Duration of antibiotic therapy to be determined  Acute respiratory failure with hypoxia ([name]) Most likely secondary to  pneumonia (community acquired versus aspiration) Initially intubated but has been extubated and is currently on high flow nasal cannula 50%/50L. Seen by PCCM for evaluation of respiratory distress and started on systemic steroids and bronchodilator therapy. 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 Continue Eliquis Hold Cabozantinib  CKD (chronic kidney disease) Patient with a history of stage IV chronic kidney disease now worsened and most likely related to contrast patient received for CT angio done on admission to rule out pulmonary embolism Serum creatinine is 4.09 above a baseline of 3.29. Noted to have mild metabolic acidosis. We will consult nephrology Obtain renal ultrasound Gentle IV fluid hydration Repeat renal parameters in a.m.  Type 2 diabetes mellitus with diabetic chronic kidney disease ([name]) Blood sugar checks every 4 hours Sliding scale insulin        Subjective: Patient seen and examined at bedside.  Noted to be tachypneic.  On high flow nasal cannula  Physical Exam: Vitals:   04/10/22 0831 04/10/22 1200 04/10/22 1204 04/10/22 1216  BP:   (!) 168/112   Pulse:  85 96   Resp:  (!) 24 19   Temp:   98.7 F (37.1 C)   TempSrc:   Oral   SpO2: 95% (!) 74% 98% 94%  Weight:      Height:       General: Elderly gentleman, chronically ill appearing, on high flow nasal cannula, very talkative HEENT: [place]/AT, moist mucous membranes, sclera anicteric.   Neuro: Moving all extremities CV: rrr, s1s2, no murmurs PULM: Scattered rhonchi and wheezes in all lung fields GI: soft, non-tender, non-distended, BS+ Extremities: warm, no edema   Data Reviewed: Relevant notes from primary care and specialist visits, past discharge  summaries as available in EHR, including Care Everywhere. Prior diagnostic testing as pertinent to current admission diagnoses Updated medications and problem lists for reconciliation ED course, including vitals, labs, imaging, treatment and response to treatment Triage notes, nursing and pharmacy notes and ED provider's notes Notable results as noted in HPI Labs reviewed and noted to have worsening renal function. 3.29 >> 3.74 >> 4.09 There are no new results to review at this time.  Family Communication:   Disposition: Status is: Inpatient Remains inpatient appropriate because: Continues to require high flow nasal cannula to maintain pulse oximetry greater than 92%.  At risk for reintubation  Planned Discharge Destination: To be determined    Time spent: 40 minutes  Author: Collier Bullock, MD 04/10/2022 3:26 PM  For on call review [email or web address].

## 2022-04-10 NOTE — Progress Notes (Signed)
  Chaplain On-Call responded to Rapid Response notification at 1145 hours to Room 250.  Staff stated that the patient was moved to [place].  Patient being attended by medical team in the ICU.  Chaplain is available for additional support if requested.  Chaplain Pollyann Samples M.Div., [facility]

## 2022-04-10 NOTE — Progress Notes (Signed)
ANTICOAGULATION CONSULT NOTE  Pharmacy Consult for IV Heparin Indication: ACS, on apixaban PTA for Hx PE  Patient Measurements: Height: 6' (182.9 cm) Weight: 101.8 kg (224 lb 6.9 oz) IBW/kg (Calculated) : 77.6 Heparin Dosing Weight: 98.4  Labs: Recent Labs    04/08/22 1108 04/08/22 1325 04/08/22 1601 04/08/22 1801 04/08/22 1807 04/09/22 0213 04/09/22 1324 04/09/22 1543 04/10/22 0052 04/10/22 0506 04/10/22 0929  HGB 11.6*  --   --   --   --  11.3*  --   --   --  10.4*  --   HCT 35.0*  --   --   --   --  32.1*  --   --   --  29.7*  --   PLT 219  --   --   --   --  206  --   --   --  141*  --   APTT 41*  --   --   --   --  >200* >200* 169* 81*  --  84*  LABPROT 17.0*  --   --   --   --   --   --   --   --   --   --   INR 1.4*  --   --   --   --   --   --   --   --   --   --   HEPARINUNFRC  --   --   --   --  0.93*  --  >1.10*  --   --   --  0.46  CREATININE 3.94*  --   --   --   --  3.74*  --   --   --  4.09*  --   TROPONINIHS 95*   < > 913* 1,131*  --  716*  --   --   --   --   --    < > = values in this interval not displayed.    Estimated Creatinine Clearance: 20.2 mL/min (A) (by C-G formula based on SCr of 4.09 mg/dL (H)).  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 Patient is currently on IV heparin given concern for acute coronary syndrome.  Goal of Therapy:  Heparin level 0.3-0.7 units/ml aPTT 66-102 seconds Monitor platelets by  anticoagulation protocol: Yes   0814 0213 aPTT >200, supratherapeutic 0814 1324 aPTT >200, supratherapeutic (repeat below to confirm) 0814 1543 aPTT 169, supratherapeutic 1000 > 700 un/hr 0815 0052 aPTT 81, therapeutic X 1  0815 0929 aPTT 84, therapeutic x 2  Plan:   Will continue pt on current rate until therapy completed Okay to stop heparin drip after 48hr per cardiology indication (started 8/13'@1821'$ ). Will follow up if patient able to start oral therapy before stopping infusion  Lillia Lengel Rodriguez-Guzman PharmD, BCPS 04/10/2022 11:22 AM

## 2022-04-10 NOTE — Progress Notes (Addendum)
PHARMACY CONSULT NOTE  Pharmacy Consult for Electrolyte Monitoring and Replacement   Recent Labs: Potassium (mmol/L)  Date Value  04/10/2022 3.8  10/26/2013 2.7 (L)   Magnesium (mg/dL)  Date Value  04/10/2022 2.1  10/26/2013 2.1   Calcium (mg/dL)  Date Value  04/10/2022 8.6 (L)   Calcium, Total (mg/dL)  Date Value  10/26/2013 10.2 (H)   Albumin (g/dL)  Date Value  04/09/2022 2.8 (L)  02/04/2019 4.1  10/26/2013 3.7   Phosphorus (mg/dL)  Date Value  04/10/2022 4.1   Sodium (mmol/L)  Date Value  04/10/2022 138  02/04/2019 148 (H)  10/26/2013 140   Assessment: Patient is a 71 y/o M with medical history including 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  Intubated 8/13, extubated 8/14  Goal of Therapy:  K > 4 Mg > 2 All other electrolytes within normal limits  Plan:  -K 3.8, Kcl 20 Meq x 1 oral -Mg 2.1 -Phos 4.1 -Patient now put of ICU. Will d/c Ccm management and follow up as needed.  Danahi Reddish Rodriguez-Guzman PharmD, BCPS 04/10/2022 7:58 AM

## 2022-04-10 NOTE — Assessment & Plan Note (Addendum)
Noted to have elevated troponins in the setting of demand ischemia versus non-ST elevation MI. Twelve-lead EKG with chronic left bundle branch block. Patient was on a heparin drip for 48 hours with goal to transition to Eliquis. Not a candidate for left heart cath due to stage IV chronic kidney disease. Patient had a 2D echocardiogram with normal LVEF of 60% Seen by cardiology, plans for ischemic work-up on hold since patient is a poor candidate for left heart cath due to chronic kidney disease with serum creatinine of 4.0. Continue aspirin and metoprolol

## 2022-04-10 NOTE — Significant Event (Signed)
Rapid Response Event Note   Reason for Call :   Patient having difficulty breathing Initial Focused Assessment:   Bedside RN stated that patient got up to use the [facility] and started having respiratory distress.  Patient tachypneic and abdominal breathing in the 40's- otherwise other vitals stable.    Interventions:  Patient transferred to ICU for possible intubation.  Plan of Care:     Event Summary:   MD Notified: Dr. Tamala Julian Call Time:11:48 Arrival Time:Tx- ICU- MD at bedside End Time:11:50  Reggie Pile, RN

## 2022-04-10 NOTE — Assessment & Plan Note (Addendum)
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 Continue Eliquis Hold Cabozantinib

## 2022-04-10 NOTE — Progress Notes (Signed)
Progress Note  Patient Name: Riley Stuart Date of Encounter: 04/10/2022  [facility] Cardiologist: Nelva Bush, MD   Subjective   Overnight events discussed with nursing Increased respiratory distress, tachypneic, difficulty swallowing drink, concern for aspiration per nursing, frequent nebulizers overnight This morning on full facemask, has been started on steroids for wheezing No other complaints, talkative  Inpatient Medications    Scheduled Meds:  arformoterol  15 mcg Nebulization BID   aspirin EC  81 mg Oral Daily   budesonide (PULMICORT) nebulizer solution  0.5 mg Nebulization BID   Chlorhexidine Gluconate Cloth  6 each Topical Q0600   insulin aspart  0-9 Units Subcutaneous Q4H   lamoTRIgine  100 mg Oral BID   methylPREDNISolone (SOLU-MEDROL) injection  120 mg Intravenous Q24H   metoprolol tartrate  25 mg Oral BID   mouth rinse  15 mL Mouth Rinse QID   pantoprazole (PROTONIX) IV  40 mg Intravenous Daily   potassium chloride  20 mEq Oral Once   revefenacin  175 mcg Nebulization Daily   tamsulosin  0.4 mg Oral Daily   Continuous Infusions:  sodium chloride Stopped (04/08/22 1515)   sodium chloride     heparin 700 Units/hr (04/10/22 0614)   piperacillin-tazobactam (ZOSYN)  IV 12.5 mL/hr at 04/10/22 0614   PRN Meds: docusate sodium, fentaNYL (SUBLIMAZE) injection, ipratropium-albuterol, midazolam, mouth rinse, polyethylene glycol   Vital Signs    Vitals:   04/10/22 0601 04/10/22 0700 04/10/22 0750 04/10/22 0831  BP:   (!) 145/131   Pulse:  94 92   Resp:  (!) 29 (!) 26   Temp:   97.8 F (36.6 C)   TempSrc:   Axillary   SpO2:  92% (!) 88% 95%  Weight: 101.8 kg     Height:        Intake/Output Summary (Last 24 hours) at 04/10/2022 0946 Last data filed at 04/10/2022 6270 Gross per 24 hour  Intake 540.9 ml  Output 500 ml  Net 40.9 ml      04/10/2022    6:01 AM 04/09/2022    4:09 AM 04/08/2022    3:01 PM  Last 3 Weights  Weight (lbs) 224  lb 6.9 oz 224 lb 3.3 oz 224 lb 3.3 oz  Weight (kg) 101.8 kg 101.7 kg 101.7 kg      Telemetry    Normal sinus rhythm- Personally Reviewed  ECG     - Personally Reviewed  Physical Exam   GEN: Moderate respiratory distress, on full facemask Neck: No JVD Cardiac: RRR, no murmurs, rubs, or gallops.  Respiratory: Clear to auscultation bilaterally. GI: Soft, nontender, non-distended  MS: No edema; No deformity. Neuro:  Nonfocal  Psych: Normal affect   Labs    High Sensitivity Troponin:   Recent Labs  Lab 04/08/22 1108 04/08/22 1325 04/08/22 1601 04/08/22 1801 04/09/22 0213  TROPONINIHS 95* 535* 913* 1,131* 716*     Chemistry Recent Labs  Lab 04/08/22 1108 04/09/22 0213 04/10/22 0506  NA 143 143 138  K 3.8 3.8 3.8  CL 112* 113* 108  CO2 21* 19* 19*  GLUCOSE 145* 94 187*  BUN 27* 37* 42*  CREATININE 3.94* 3.74* 4.09*  CALCIUM 9.3 8.5* 8.6*  MG 1.7 1.8 2.1  PROT 6.1* 5.6*  --   ALBUMIN 3.0* 2.8*  --   AST 60* 137*  --   ALT 37 67*  --   ALKPHOS 55 36*  --   BILITOT 0.8 0.7  --   GFRNONAA 15*  16* 15*  ANIONGAP '10 11 11    '$ Lipids  Recent Labs  Lab 04/09/22 0213  CHOL 86  TRIG 114  110  HDL 26*  LDLCALC 37  CHOLHDL 3.3    Hematology Recent Labs  Lab 04/08/22 1108 04/09/22 0213 04/10/22 0506  WBC 10.3 12.1* 12.4*  RBC 3.51* 3.43* 3.19*  HGB 11.6* 11.3* 10.4*  HCT 35.0* 32.1* 29.7*  MCV 99.7 93.6 93.1  MCH 33.0 32.9 32.6  MCHC 33.1 35.2 35.0  RDW 17.9* 17.1* 16.7*  PLT 219 206 141*   Thyroid  Recent Labs  Lab 04/09/22 0213  TSH 0.794    BNP Recent Labs  Lab 04/08/22 1325  BNP 125.4*    DDimer No results for input(s): "DDIMER" in the last 168 hours.   Radiology    DG Chest Port 1 View  Result Date: 04/10/2022 CLINICAL DATA:  Worsening difficulty breathing EXAM: PORTABLE CHEST 1 VIEW COMPARISON:  04/09/2022 FINDINGS: Cardiomegaly with left ventricular prominence as seen previously. Aortic atherosclerosis. Pulmonary venous  hypertension without frank edema. Partial collapse of the right lower lobe persist. Minimal patchy atelectasis at the left base. No worsened finding. IMPRESSION: Similar radiographic appearance. Partial collapse of the right lower lobe. Minimal patchy atelectasis at the left base. Electronically Signed   By: Nelson Chimes M.D.   On: 04/10/2022 07:43   DG Chest Port 1 View  Result Date: 04/09/2022 CLINICAL DATA:  Cardiac arrest. EXAM: PORTABLE CHEST 1 VIEW COMPARISON:  Chest x-ray 04/08/2022 FINDINGS: Endotracheal tube and enteric tube have been removed. The cardiac silhouette is within normal limits. Mediastinal silhouette appears stable. Right mid and lower lung airspace disease has decreased, but has not completely resolved. Costophrenic angles are clear. No pneumothorax. Left-sided rib fractures are again noted. IMPRESSION: 1. Endotracheal and enteric tubes have been removed. 2. Decreasing right lung airspace disease. Electronically Signed   By: Ronney Asters M.D.   On: 04/09/2022 17:07   ECHOCARDIOGRAM COMPLETE  Result Date: 04/09/2022    ECHOCARDIOGRAM REPORT   Patient Name:   Riley Stuart Date of Exam: 04/09/2022 Medical Rec #:  [id number]             Height:       72.0 in Accession #:    [id number]            Weight:       224.2 lb Date of Birth:  [birth year]             BSA:          2.237 m Patient Age:    71 years              BP:           92/64 mmHg Patient Gender: M                     HR:           85 bpm. Exam Location:  [facility] Procedure: 2D Echo, Cardiac Doppler and Color Doppler Indications:     I46.9 Cardiac arrest  History:         Patient has no prior history of Echocardiogram examinations.                  Renal cell carcinoma; Risk Factors:Diabetes, Current Smoker and                  Dyslipidemia.  Sonographer:     Rosalia Hammers Referring Phys:  [id number] Bradly Bienenstock Diagnosing Phys: Nelva Bush MD  Sonographer Comments: Technically difficult study due to poor echo windows.  Image acquisition challenging due to respiratory motion. IMPRESSIONS  1. Left ventricular ejection fraction, by estimation, is 60 to 65%. The left ventricle has normal function. Left ventricular endocardial border not optimally defined to evaluate regional wall motion. There is severe left ventricular hypertrophy. Left ventricular diastolic parameters are indeterminate.  2. Right ventricular systolic function is normal. The right ventricular size is normal.  3. Left atrial size was mildly dilated.  4. Right atrial size was mildly dilated.  5. The mitral valve is normal in structure. Trivial mitral valve regurgitation.  6. The aortic valve has an indeterminant number of cusps. There is mild calcification of the aortic valve. There is mild thickening of the aortic valve. Aortic valve regurgitation is not visualized. Aortic valve sclerosis is present, with no evidence of  aortic valve stenosis.  7. Aortic dilatation noted. There is mild dilatation of the ascending aorta, measuring 38 mm.  8. The inferior vena cava is normal in size with <50% respiratory variability, suggesting right atrial pressure of 8 mmHg. FINDINGS  Left Ventricle: Left ventricular ejection fraction, by estimation, is 60 to 65%. The left ventricle has normal function. Left ventricular endocardial border not optimally defined to evaluate regional wall motion. The left ventricular internal cavity size was normal in size. There is severe left ventricular hypertrophy. Abnormal (paradoxical) septal motion, consistent with left bundle branch block. Left ventricular diastolic parameters are indeterminate. Right Ventricle: The right ventricular size is normal. No increase in right ventricular wall thickness. Right ventricular systolic function is normal. Left Atrium: Left atrial size was mildly dilated. Right Atrium: Right atrial size was mildly dilated. Pericardium: Trivial pericardial effusion is present. Mitral Valve: The mitral valve is normal in  structure. Trivial mitral valve regurgitation. Tricuspid Valve: The tricuspid valve is normal in structure. Tricuspid valve regurgitation is trivial. Aortic Valve: The aortic valve has an indeterminant number of cusps. There is mild calcification of the aortic valve. There is mild thickening of the aortic valve. Aortic valve regurgitation is not visualized. Aortic valve sclerosis is present, with no evidence of aortic valve stenosis. Aortic valve mean gradient measures 7.0 mmHg. Aortic valve peak gradient measures 11.0 mmHg. Aortic valve area, by VTI measures 1.80 cm. Pulmonic Valve: The pulmonic valve was normal in structure. Pulmonic valve regurgitation is trivial. Aorta: Aortic dilatation noted. There is mild dilatation of the ascending aorta, measuring 38 mm. Venous: The inferior vena cava is normal in size with less than 50% respiratory variability, suggesting right atrial pressure of 8 mmHg. IAS/Shunts: The interatrial septum was not well visualized.  LEFT VENTRICLE PLAX 2D LVIDd:         3.48 cm LVIDs:         2.10 cm LV PW:         1.72 cm LV IVS:        1.72 cm LVOT diam:     1.70 cm LV SV:         49 LV SV Index:   22 LVOT Area:     2.27 cm  RIGHT VENTRICLE RV Basal diam:  4.50 cm RV S prime:     19.10 cm/s TAPSE (M-mode): 3.3 cm LEFT ATRIUM           Index        RIGHT ATRIUM           Index LA diam:  3.30 cm 1.48 cm/m   RA Area:     21.80 cm LA Vol (A2C): 40.7 ml 18.20 ml/m  RA Volume:   69.80 ml  31.21 ml/m LA Vol (A4C): 67.7 ml 30.27 ml/m  AORTIC VALVE AV Area (Vmax):    1.93 cm AV Area (Vmean):   1.77 cm AV Area (VTI):     1.80 cm AV Vmax:           166.00 cm/s AV Vmean:          120.000 cm/s AV VTI:            0.271 m AV Peak Grad:      11.0 mmHg AV Mean Grad:      7.0 mmHg LVOT Vmax:         141.00 cm/s LVOT Vmean:        93.600 cm/s LVOT VTI:          0.215 m LVOT/AV VTI ratio: 0.79  AORTA Ao Root diam: 3.20 cm MITRAL VALVE MV Area (PHT): 4.86 cm     SHUNTS MV Decel Time: 156 msec      Systemic VTI:  0.22 m MV E velocity: 140.00 cm/s  Systemic Diam: 1.70 cm Nelva Bush MD Electronically signed by Nelva Bush MD Signature Date/Time: 04/09/2022/1:47:18 PM    Final    CT Angio Chest PE W and/or Wo Contrast  Result Date: 04/08/2022 CLINICAL DATA:  Pulmonary embolism (PE) suspected, high prob. CPR. History of renal cell carcinoma EXAM: CT ANGIOGRAPHY CHEST WITH CONTRAST TECHNIQUE: Multidetector CT imaging of the chest was performed using the standard protocol during bolus administration of intravenous contrast. Multiplanar CT image reconstructions and MIPs were obtained to evaluate the vascular anatomy. RADIATION DOSE REDUCTION: This exam was performed according to the departmental dose-optimization program which includes automated exposure control, adjustment of the mA and/or kV according to patient size and/or use of iterative reconstruction technique. CONTRAST:  62m OMNIPAQUE IOHEXOL 350 MG/ML SOLN COMPARISON:  Same day CT chest 1143 hours, 02/19/2022 FINDINGS: Cardiovascular: Good contrast bolus timing with satisfactory opacification of the central pulmonary arteries. The segmental and subsegmental branch pulmonary arteries are suboptimally evaluated secondary to respiratory motion artifact. Within this limitation, there is no evidence of pulmonary embolism to the lobar branch level. Thoracic aorta is nonaneurysmal. Scattered atherosclerotic vascular calcifications of the aorta and coronary arteries. Heart size is within normal limits. Trace pericardial fluid. Mediastinum/Nodes: No axillary, mediastinal, or hilar lymphadenopathy. Endotracheal and enteric tubes remain appropriately positioned. Lungs/Pleura: Moderate emphysema. Slight worsening of dependent airspace consolidation within the right upper and right lower lobes. Similar degree of dependent opacity in the left lower lobe. Areas of nodular and band-like scarring in the periphery of the left upper lobe and right middle lobes  are unchanged. No pneumothorax. No significant pleural effusion. Upper Abdomen: Rounded low-density lesions within the liver, not appreciably changed from 02/19/2022. No acute abnormality within the upper abdomen. Musculoskeletal: Redemonstrated bilateral rib fractures, the left fourth and fifth ribs being mildly displaced. Remaining fractures are otherwise nondisplaced. No subcutaneous emphysema. No chest wall hematoma. Nondisplaced sternal body fracture. Thoracic vertebral body heights are maintained. Review of the MIP images confirms the above findings. IMPRESSION: 1. No evidence of pulmonary embolism to the lobar branch level. 2. Slight worsening of dependent airspace consolidation within the right upper and right lower lobes. 3. Otherwise, no significant interval change in the appearance of the chest compared to recent previous exam. 4. Redemonstrated bilateral rib fractures.  No pneumothorax. Aortic  Atherosclerosis (ICD10-I70.0) and Emphysema (ICD10-J43.9). Electronically Signed   By: Davina Poke D.O.   On: 04/08/2022 13:26   CT Chest Wo Contrast  Result Date: 04/08/2022 CLINICAL DATA:  71 year old male found unresponsive, CPR in progress. Left facial droop. History of left renal cell carcinoma and partial nephrectomy. EXAM: CT CHEST WITHOUT CONTRAST TECHNIQUE: Multidetector CT imaging of the chest was performed following the standard protocol without IV contrast. RADIATION DOSE REDUCTION: This exam was performed according to the departmental dose-optimization program which includes automated exposure control, adjustment of the mA and/or kV according to patient size and/or use of iterative reconstruction technique. COMPARISON:  Cervical spine CT today. Restaging noncontrast Chest CT 02/19/2022. FINDINGS: Cardiovascular: Calcified coronary artery atherosclerosis. Calcified aortic atherosclerosis. Mild cardiomegaly appears stable. No pericardial effusion. Vascular patency is not evaluated in the absence  of IV contrast. Mediastinum/Nodes: Gas distended proximal thoracic esophagus containing enteric tube which continues to the abdomen. The mid and distal thoracic esophagus are decompressed. No mediastinal mass or lymphadenopathy identified. Lungs/Pleura: Intubated. ETT tip in good position above the carina. Major airways remain patent. Dependent right upper and lower lung consolidation is extensive. Underlying emphysema. Chronic bilateral middle lobe opacity/scarring along major fissures on series 3, image 84 is unchanged. Trace if any layering pleural fluid. No pneumothorax. Upper Abdomen: Enteric tube continues into the body of the stomach. No free air or free fluid in the visible upper abdomen. Stable visible noncontrast liver, spleen, adrenal glands and right kidney. Musculoskeletal: Left side 2 through 7 anterolateral rib fractures, some mildly displaced (series 3, image 59). Right side 2 through 7 anterior rib fractures, several at the costochondral junctions. Nondisplaced mid sternal fracture (series 5, image 32). Trace overlying subcutaneous hematoma or contusion. Thoracic vertebrae appear stable. No suspicious osseous lesion. IMPRESSION: 1. Extensive dependent lung consolidation right > left. Consider aspiration, pneumonia. Trace if any layering pleural fluid. Underlying Emphysema (ICD10-J43.9). 2. CPR related bilateral 2 through 7 rib fractures, central sternal fracture. 3. ETT and visible enteric tube in good position. 4. Calcified coronary artery, Aortic Atherosclerosis (ICD10-I70.0). Stable mild cardiomegaly. Electronically Signed   By: Genevie Ann M.D.   On: 04/08/2022 12:07   CT Cervical Spine Wo Contrast  Result Date: 04/08/2022 CLINICAL DATA:  71 year old male found unresponsive, CPR in progress. Left facial droop. EXAM: CT CERVICAL SPINE WITHOUT CONTRAST TECHNIQUE: Multidetector CT imaging of the cervical spine was performed without intravenous contrast. Multiplanar CT image reconstructions were  also generated. RADIATION DOSE REDUCTION: This exam was performed according to the departmental dose-optimization program which includes automated exposure control, adjustment of the mA and/or kV according to patient size and/or use of iterative reconstruction technique. COMPARISON:  Head CT today, 10/26/2013. FINDINGS: Alignment: Preserved cervical lordosis. Cervicothoracic junction alignment is within normal limits. Bilateral posterior element alignment is within normal limits. Skull base and vertebrae: Visualized skull base is intact. No atlanto-occipital dissociation. C1 and C2 appear intact and aligned. An oval sclerotic focus in the right clivus has been present since 2015, benign. No acute osseous abnormality identified. Soft tissues and spinal canal: Visible right nasoenteric and endotracheal tubes course appropriately into the airway and esophagus. Disc levels: Mild for age cervical spine degeneration, mostly right side facet arthropathy. Upper chest: Partially visible dependent consolidation in the right lung. Mildly gas distended proximal thoracic esophagus. Other: Absent dentition. IMPRESSION: 1. No acute traumatic injury identified in the cervical spine. 2. Satisfactory course of visible right nasoenteric and endotracheal tubes. 3. Partially visible dependent consolidation in the right lung. Electronically  Signed   By: Genevie Ann M.D.   On: 04/08/2022 12:01   CT HEAD WO CONTRAST (5MM)  Result Date: 04/08/2022 CLINICAL DATA:  71 year old male found unresponsive, CPR in progress. Left facial droop. EXAM: CT HEAD WITHOUT CONTRAST TECHNIQUE: Contiguous axial images were obtained from the base of the skull through the vertex without intravenous contrast. RADIATION DOSE REDUCTION: This exam was performed according to the departmental dose-optimization program which includes automated exposure control, adjustment of the mA and/or kV according to patient size and/or use of iterative reconstruction technique.  COMPARISON:  Head CT 10/26/2013. FINDINGS: Brain: Bilateral inferior frontal gyrus encephalomalacia, interval left frontotemporal craniotomy. No midline shift, ventriculomegaly, mass effect, evidence of mass lesion, intracranial hemorrhage or evidence of cortically based acute infarction. Outside of the anterior frontal lobes gray-white matter differentiation is within normal limits. Vascular: Anterior communicating artery region aneurysm clip is new. No suspicious intracranial vascular hyperdensity. Skull: Left frontotemporal craniotomy since 2015. No acute osseous abnormality identified. Sinuses/Orbits: Maxillary alveolar recess mucosal thickening. Other Visualized paranasal sinuses and mastoids are stable and well aerated. Other: No acute orbit or scalp soft tissue finding identified. Right nasoenteric tube in place. Endotracheal tube partially visible in the pharynx. IMPRESSION: 1. No acute intracranial abnormality. 2. 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 Associated inferior frontal gyrus encephalomalacia. Electronically Signed   By: Genevie Ann M.D.   On: 04/08/2022 11:59   DG Chest Portable 1 View  Result Date: 04/08/2022 CLINICAL DATA:  Shortness of breath. Status post CPR and intubation. EXAM: PORTABLE CHEST 1 VIEW COMPARISON:  05/12/2021 radiograph and 02/19/2022 CT FINDINGS: Endotracheal tube with tip 5 cm above the carina and enteric tube entering the stomach with tip off the field of view noted. Defibrillator/pacing pads overlying the LOWER chest noted. Diffuse airspace opacities throughout the RIGHT lung noted. Interstitial/mild patchy opacities within the LEFT lung noted. There is no evidence of pleural effusion or pneumothorax. Several radiopaque structures overlying the chest are likely external to the patient but correlate clinically. There are fractures of the LEFT 4th through 7th ribs. IMPRESSION: 1. Fractures of the LEFT 4th through 7th ribs. No  evidence of pneumothorax. 2. Diffuse RIGHT lung airspace opacities and mild LEFT lung patchy opacities - question asymmetric edema, infection or aspiration. 3. Endotracheal tube with tip 5 cm above the carina and enteric tube entering the stomach with tip off the field of view. Electronically Signed   By: Margarette Canada M.D.   On: 04/08/2022 11:30    Cardiac Studies   Echo   1. Left ventricular ejection fraction, by estimation, is 60 to 65%. The  left ventricle has normal function. Left ventricular endocardial border  not optimally defined to evaluate regional wall motion. There is severe  left ventricular hypertrophy. Left  ventricular diastolic parameters are indeterminate.   2. Right ventricular systolic function is normal. The right ventricular  size is normal.   3. Left atrial size was mildly dilated.   4. Right atrial size was mildly dilated.   5. The mitral valve is normal in structure. Trivial mitral valve  regurgitation.   6. The aortic valve has an indeterminant number of cusps. There is mild  calcification of the aortic valve. There is mild thickening of the aortic  valve. Aortic valve regurgitation is not visualized. Aortic valve  sclerosis is present, with no evidence of   aortic valve stenosis.   7. Aortic dilatation noted. There is mild dilatation of the ascending  aorta,  measuring 38 mm.   8. The inferior vena cava is normal in size with <50% respiratory  variability, suggesting right atrial pressure of 8 mmHg.   Patient Profile  Riley Stuart is a 71 y.o. male with a hx of chronic kidney disease stage IV, diabetes, hypertension, renal cell carcinoma on oral chemotherapy, schizoaffective disorder, h/o of PE, ruptured cerebral aneurysm with subarachnoid hemorrhage who is being seen 04/09/2022 for the evaluation of out-of-hospital cardiac arrest  Assessment & Plan    Out of hospital cardiac arrest with unknown etiology; Elevated troponins in the setting of  demand  ischemia vs NSTEMI - ACLS time was approximately 50 minutes to obtain ROSC - Hs troponins trended 95, 535, 913, 1131, and 716 -Would complete heparin infusion 48 hours - EKG with chronic LBBB - Echocardiogram with normal LV function EF 60% - currently off of pressors -Plans for ischemic work-up on hold for now, poor candidate for cardiac catheterization with creatinine of 4.0, likely unable to lay flat/most list for Myoview in setting of respiratory distress.    Acute hypoxic/ hypercapnic respiratory failure/ Aspiration vs CAP  - extubated yesterday without difficulty Increased respiratory distress overnight, more wheezing On broad-spectrum antibiotics, now on steroids   AKI on CKD IV - serum creatinine 4, has been trending higher over the past week Case discussed with nephrology, plan to start low rate IV fluids   Diabetes - A1c 5.7 - continue insulin  5. Acute metabolic encephalopathy with history of schizoaffective disoprder - avoid sedating medications  - supportive care   6. H/o pulmonary embolism - currently on heparin drip - will need to he transitioned back to eliquis 2.5 mg bid prior to d/c - no active bleeding noted - CT of the chest negative for PE but did show consolidations in the RUL and RLL     Total encounter time more than 50 minutes  Greater than 50% was spent in counseling and coordination of care with the patient   For questions or updates, please contact [place] Please consult [email or web address] for contact info under        Signed, Ida Rogue, MD  04/10/2022, 9:46 AM

## 2022-04-10 NOTE — Progress Notes (Signed)
04/10/2022   I have seen and evaluated the patient for respiratory failure after cardiac arrest.  S:  Some breathing issues overnight.  Improved with nebs and pain control.  Patient still a bit confused (vs. Baseline?).  O: Blood pressure (!) 145/131, pulse 92, temperature 97.8 F (36.6 C), temperature source Axillary, resp. rate (!) 26, height 6' (1.829 m), weight 101.8 kg, SpO2 (!) 88 %.  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

## 2022-04-10 NOTE — Progress Notes (Signed)
       CROSS COVER NOTE  NAME: Riley Stuart MRN: [id number] DOB : 1951/06/24    Date of Service   04/10/22  HPI/Events of Note   Called by nursing to see PCCM patient with reports of tachypnea, wheezes, and air hunger. He was given a duoneb and fentanyl prior to my arrival to bedside.  On my assessment Mr Frasier has rhonchi throughout, accessory muscle use, and conversational dyspnea. Discussed with PCCM.   Interventions   Plan: Increased Duoneb frequency to q4H PRN Bipap PRN      This document was prepared using Dragon voice recognition software and may include unintentional dictation errors.  Neomia Glass DNP, MHA, FNP-BC Nurse Practitioner [facility] Pager [phone]

## 2022-04-10 NOTE — Progress Notes (Signed)
PT Cancellation Note  Patient Details Name: Riley Stuart MRN: [id number] DOB: 13-Oct-1950   Cancelled Treatment:    Reason Eval/Treat Not Completed: Patient not medically ready.  Chart reviewed.  Pt with rapid response today and transferred to ICU.  Per MD note "Transferred back to ICU due to respiratory distress and concerns for reintubation".  D/t pt transferring to higher level of care, per PT protocol require new PT consult in order to continue therapy (will discontinue current PT order d/t this).  Please re-consult PT when pt is medically appropriate to participate in PT.  Leitha Bleak, PT 04/10/22, 3:33 PM

## 2022-04-10 NOTE — Assessment & Plan Note (Signed)
Patient presented after cardiac arrest with initial rhythm of asystole and subsequent rhythms notable for ventricular tachycardia. Received a total of 50 minutes of CPR. Intubated in the emergency room on 04/08/22 and was extubated on 04/09/22. Transferred back to ICU on 815 due to worsening respiratory distress and is currently on HFNC at 50L/50%

## 2022-04-10 NOTE — Progress Notes (Signed)
       CROSS COVER NOTE  NAME: Riley Stuart MRN: [id number] DOB : 1951/04/17    Date of Service   04/10/22  HPI/Events of Note   Message received from nursing staff reporting increased agitation, attempting to get out of bed, and removing oxygen tubing and telemetry wires. EKG repeated and patient still had prolonged qtc.  Interventions   Plan: Safety Sitter Melatonin Versed increased to '1mg'$  q4H       This document was prepared using Dragon voice recognition software and may include unintentional dictation errors.  Neomia Glass DNP, MHA, FNP-BC Nurse Practitioner [name] Pager [phone]

## 2022-04-10 NOTE — Progress Notes (Signed)
[facility]  ROUNDING NOTE   Subjective:   Riley Stuart is a 71 y.o. male with past medical history including hypertension, diabetes, renal cell carcinoma, with partial nephrectomy, and chronic kidney disease.  Patient presents to the emergency department from his group home with reports of a facial droop.  Patient has been admitted for Cardiac arrest [facility]) [I46.9] Lactic acidosis [E87.20] Aspiration pneumonia of both lower lobes, unspecified aspiration pneumonia type [facility]) [J69.0]  Patient known to our practice and is followed outpatient by Dr. Juleen China.  Patient was last seen in the office 01/24/2022 by Dr. Holley Raring for routine follow-up.  He was found to be altered and is group home and staff called EMS.  Chart review states patient lost pulse in route to emergency department and eating CPR was initiated by EMT.  He was intubated for short time and placed in ICU.  He was extubated and weaned to high flow nasal cannula and moved to telemetry bed.  Patient seen and evaluated at bedside.  Patient remains on high flow nasal cannula.  Denies pain or discomfort.  No lower extremity edema.  Labs on ED arrival include sodium 143, potassium 3.8, serum bicarb 21, glucose 145, BUN 27, creatinine 3.94 with GFR 15, phosphorus 5.8, albumin 3.0, and troponin 95.  Lactic acid 5.6.  Chest x-ray positive for left rib fractures at 4 and 7, suspected from CPR, and bilateral lung opacities suspicious for edema, infection or aspiration.CT head and spine negative.  Fractures and opacities confirmed in CT angio chest.  We have been consulted to evaluate acute kidney injury.   Objective:  Vital signs in last 24 hours:  Temp:  [97.4 F (36.3 C)-98.7 F (37.1 C)] 98.7 F (37.1 C) (08/15 1204) Pulse Rate:  [85-106] 96 (08/15 1204) Resp:  [19-29] 19 (08/15 1204) BP: (144-188)/(80-132) 168/112 (08/15 1204) SpO2:  [74 %-99 %] 94 % (08/15 1216) FiO2 (%):  [40 %-50 %] 50 % (08/15 1216) Weight:   [101.8 kg] 101.8 kg (08/15 0601)  Weight change: -17 kg Filed Weights   04/08/22 1501 04/09/22 0409 04/10/22 0601  Weight: 101.7 kg 101.7 kg 101.8 kg    Intake/Output: I/O last 3 completed shifts: In: 1613.9 [P.O.:320; I.V.:513; IV Piggyback:780.9] Out: 1200 [Urine:1200]   Intake/Output this shift:  Total I/O In: 120.1 [I.V.:82.5; IV Piggyback:37.6] Out: 900 [Urine:900]  Physical Exam: General: NAD, resting comfortably  Head: Normocephalic, atraumatic. Moist oral mucosal membranes  Eyes: Anicteric  Lungs:  Crackles and rhonchi throughout, mild tachypnea, HFNC  Heart: Regular rate and rhythm  Abdomen:  Soft, nontender, nondistended  Extremities: No peripheral edema.  Neurologic: Nonfocal, moving all four extremities  Skin: No lesions  Access: None    Basic Metabolic Panel: Recent Labs  Lab 04/08/22 1108 04/09/22 0213 04/10/22 0506  NA 143 143 138  K 3.8 3.8 3.8  CL 112* 113* 108  CO2 21* 19* 19*  GLUCOSE 145* 94 187*  BUN 27* 37* 42*  CREATININE 3.94* 3.74* 4.09*  CALCIUM 9.3 8.5* 8.6*  MG 1.7 1.8 2.1  PHOS 5.8* 5.2* 4.1    Liver Function Tests: Recent Labs  Lab 04/08/22 1108 04/09/22 0213  AST 60* 137*  ALT 37 67*  ALKPHOS 55 36*  BILITOT 0.8 0.7  PROT 6.1* 5.6*  ALBUMIN 3.0* 2.8*   No results for input(s): "LIPASE", "AMYLASE" in the last 168 hours. No results for input(s): "AMMONIA" in the last 168 hours.  CBC: Recent Labs  Lab 04/08/22 1108 04/09/22 0213 04/10/22 1884  WBC 10.3 12.1* 12.4*  NEUTROABS 5.6  --   --   HGB 11.6* 11.3* 10.4*  HCT 35.0* 32.1* 29.7*  MCV 99.7 93.6 93.1  PLT 219 206 141*    Cardiac Enzymes: No results for input(s): "CKTOTAL", "CKMB", "CKMBINDEX", "TROPONINI" in the last 168 hours.  BNP: Invalid input(s): "POCBNP"  CBG: Recent Labs  Lab 04/10/22 0003 04/10/22 0408 04/10/22 0755 04/10/22 1207 04/10/22 1345  GLUCAP 193* 193* 184* 199* 183*    Microbiology: Results for orders placed or performed  during the hospital encounter of 04/08/22  Urine Culture     Status: None   Collection Time: 04/08/22  1:37 PM   Specimen: Urine, Random  Result Value Ref Range Status   Specimen Description   Final    URINE, RANDOM Performed at [name], [address] [place], [place] [address]    Special Requests   Final    NONE Performed at [facility], [address], [place], [place] [address]    Culture   Final    NO GROWTH Performed at [facility], [place] [address] [place], [place] [address]    Report Status 04/09/2022 FINAL  Final  MRSA Next Gen by PCR, Nasal     Status: None   Collection Time: 04/08/22  3:00 PM   Specimen: Nasal Mucosa; Nasal Swab  Result Value Ref Range Status   MRSA by PCR Next Gen NOT DETECTED NOT DETECTED Final    Comment: (NOTE) The GeneXpert MRSA Assay (FDA approved for NASAL specimens only), is one component of a comprehensive MRSA colonization surveillance program. It is not intended to diagnose MRSA infection nor to guide or monitor treatment for MRSA infections. Test performance is not FDA approved in patients less than 38 years old. Performed at [facility], [address] [place], [place] [address]   Culture, blood (Routine X 2) w Reflex to ID Panel     Status: None (Preliminary result)   Collection Time: 04/08/22  6:01 PM   Specimen: BLOOD  Result Value Ref Range Status   Specimen Description BLOOD BLOOD LEFT FOREARM  Final   Special Requests IN PEDIATRIC BOTTLE Blood Culture adequate volume  Final   Culture   Final    NO GROWTH 2 DAYS Performed at [facility], [address] [place], [place] [address]    Report Status PENDING  Incomplete  Culture, blood (Routine X 2) w Reflex to ID Panel     Status: None (Preliminary result)   Collection Time: 04/08/22  6:01 PM   Specimen: BLOOD  Result Value Ref Range Status   Specimen Description BLOOD BLOOD LEFT HAND  Final   Special Requests   Final     BOTTLES DRAWN AEROBIC AND ANAEROBIC Blood Culture adequate volume   Culture   Final    NO GROWTH 2 DAYS Performed at [name], [address], [place], [place] [address]    Report Status PENDING  Incomplete    Coagulation Studies: Recent Labs    04/08/22 1108  LABPROT 17.0*  INR 1.4*    Urinalysis: Recent Labs    04/08/22 1325  COLORURINE YELLOW*  LABSPEC 1.010  PHURINE 5.0  GLUCOSEU 50*  HGBUR SMALL*  BILIRUBINUR NEGATIVE  KETONESUR NEGATIVE  PROTEINUR 100*  NITRITE NEGATIVE  LEUKOCYTESUR NEGATIVE      Imaging: DG Chest Port 1 View  Result Date: 04/10/2022 CLINICAL DATA:  Worsening difficulty breathing EXAM: PORTABLE CHEST 1 VIEW COMPARISON:  04/09/2022 FINDINGS: Cardiomegaly with left ventricular prominence as seen previously. Aortic atherosclerosis. Pulmonary venous hypertension without frank edema. Partial collapse of the right lower lobe persist. Minimal patchy atelectasis at the left base. No worsened finding. IMPRESSION: Similar radiographic appearance. Partial collapse of the right lower lobe. Minimal patchy atelectasis at the left base. Electronically Signed   By: Nelson Chimes M.D.   On: 04/10/2022 07:43   DG Chest Port 1 View  Result Date: 04/09/2022 CLINICAL DATA:  Cardiac arrest. EXAM: PORTABLE CHEST 1 VIEW COMPARISON:  Chest x-ray 04/08/2022 FINDINGS: Endotracheal tube and enteric tube have been removed. The cardiac silhouette is within normal limits. Mediastinal silhouette appears stable. Right mid and lower lung airspace disease has decreased, but has not completely resolved. Costophrenic angles are clear. No pneumothorax. Left-sided rib fractures are again noted. IMPRESSION: 1. Endotracheal and enteric tubes have been removed. 2. Decreasing right lung airspace disease. Electronically Signed   By: Ronney Asters M.D.   On: 04/09/2022 17:07   ECHOCARDIOGRAM COMPLETE  Result Date: 04/09/2022    ECHOCARDIOGRAM REPORT   Patient Name:   Riley Stuart Date of Exam: 04/09/2022 Medical Rec #:  [id number]             Height:       72.0 in Accession #:    [id number]            Weight:       224.2 lb Date of Birth:  02-18-1951             BSA:          2.237 m Patient Age:    68 years              BP:           92/64 mmHg Patient Gender: M                     HR:           85 bpm. Exam Location:  [facility] Procedure: 2D Echo, Cardiac Doppler and Color Doppler Indications:     I46.9 Cardiac arrest  History:         Patient has no prior history of Echocardiogram examinations.                  Renal cell carcinoma; Risk Factors:Diabetes, Current Smoker and                  Dyslipidemia.  Sonographer:     Rosalia Hammers Referring Phys:  [id number] Bradly Bienenstock Diagnosing Phys: Nelva Bush MD  Sonographer Comments: Technically difficult study due to poor echo windows. Image acquisition challenging due to respiratory motion. IMPRESSIONS  1. Left ventricular ejection fraction, by estimation, is 60 to 65%. The left ventricle has normal function. Left ventricular endocardial border not optimally defined to evaluate regional wall motion. There is severe left ventricular hypertrophy. Left ventricular diastolic parameters are indeterminate.  2. Right ventricular systolic function is normal. The right ventricular size is normal.  3. Left atrial size was mildly dilated.  4. Right atrial size was mildly dilated.  5. The mitral valve is normal in structure. Trivial mitral valve regurgitation.  6. The aortic valve has an indeterminant number of cusps. There is mild calcification of the aortic valve. There is mild thickening of the aortic valve. Aortic valve regurgitation is not visualized. Aortic valve sclerosis is present, with no evidence of  aortic valve stenosis.  7. Aortic  dilatation noted. There is mild dilatation of the ascending aorta, measuring 38 mm.  8. The inferior vena cava is normal in size with <50% respiratory variability, suggesting right atrial pressure of 8  mmHg. FINDINGS  Left Ventricle: Left ventricular ejection fraction, by estimation, is 60 to 65%. The left ventricle has normal function. Left ventricular endocardial border not optimally defined to evaluate regional wall motion. The left ventricular internal cavity size was normal in size. There is severe left ventricular hypertrophy. Abnormal (paradoxical) septal motion, consistent with left bundle branch block. Left ventricular diastolic parameters are indeterminate. Right Ventricle: The right ventricular size is normal. No increase in right ventricular wall thickness. Right ventricular systolic function is normal. Left Atrium: Left atrial size was mildly dilated. Right Atrium: Right atrial size was mildly dilated. Pericardium: Trivial pericardial effusion is present. Mitral Valve: The mitral valve is normal in structure. Trivial mitral valve regurgitation. Tricuspid Valve: The tricuspid valve is normal in structure. Tricuspid valve regurgitation is trivial. Aortic Valve: The aortic valve has an indeterminant number of cusps. There is mild calcification of the aortic valve. There is mild thickening of the aortic valve. Aortic valve regurgitation is not visualized. Aortic valve sclerosis is present, with no evidence of aortic valve stenosis. Aortic valve mean gradient measures 7.0 mmHg. Aortic valve peak gradient measures 11.0 mmHg. Aortic valve area, by VTI measures 1.80 cm. Pulmonic Valve: The pulmonic valve was normal in structure. Pulmonic valve regurgitation is trivial. Aorta: Aortic dilatation noted. There is mild dilatation of the ascending aorta, measuring 38 mm. Venous: The inferior vena cava is normal in size with less than 50% respiratory variability, suggesting right atrial pressure of 8 mmHg. IAS/Shunts: The interatrial septum was not well visualized.  LEFT VENTRICLE PLAX 2D LVIDd:         3.48 cm LVIDs:         2.10 cm LV PW:         1.72 cm LV IVS:        1.72 cm LVOT diam:     1.70 cm LV SV:          49 LV SV Index:   22 LVOT Area:     2.27 cm  RIGHT VENTRICLE RV Basal diam:  4.50 cm RV S prime:     19.10 cm/s TAPSE (M-mode): 3.3 cm LEFT ATRIUM           Index        RIGHT ATRIUM           Index LA diam:      3.30 cm 1.48 cm/m   RA Area:     21.80 cm LA Vol (A2C): 40.7 ml 18.20 ml/m  RA Volume:   69.80 ml  31.21 ml/m LA Vol (A4C): 67.7 ml 30.27 ml/m  AORTIC VALVE AV Area (Vmax):    1.93 cm AV Area (Vmean):   1.77 cm AV Area (VTI):     1.80 cm AV Vmax:           166.00 cm/s AV Vmean:          120.000 cm/s AV VTI:            0.271 m AV Peak Grad:      11.0 mmHg AV Mean Grad:      7.0 mmHg LVOT Vmax:         141.00 cm/s LVOT Vmean:        93.600 cm/s LVOT VTI:  0.215 m LVOT/AV VTI ratio: 0.79  AORTA Ao Root diam: 3.20 cm MITRAL VALVE MV Area (PHT): 4.86 cm     SHUNTS MV Decel Time: 156 msec     Systemic VTI:  0.22 m MV E velocity: 140.00 cm/s  Systemic Diam: 1.70 cm Nelva Bush MD Electronically signed by Nelva Bush MD Signature Date/Time: 04/09/2022/1:47:18 PM    Final      Medications:    sodium chloride Stopped (04/08/22 1515)   sodium chloride 40 mL/hr at 04/10/22 1200   heparin 700 Units/hr (04/10/22 1200)   piperacillin-tazobactam (ZOSYN)  IV 3.375 g (04/10/22 1351)    amLODipine  10 mg Oral Daily   apixaban  2.5 mg Oral BID   arformoterol  15 mcg Nebulization BID   aspirin EC  81 mg Oral Daily   budesonide (PULMICORT) nebulizer solution  0.5 mg Nebulization BID   Chlorhexidine Gluconate Cloth  6 each Topical Q0600   insulin aspart  0-9 Units Subcutaneous Q4H   lamoTRIgine  100 mg Oral BID   methylPREDNISolone (SOLU-MEDROL) injection  120 mg Intravenous Q24H   metoprolol tartrate  25 mg Oral BID   mouth rinse  15 mL Mouth Rinse QID   pantoprazole (PROTONIX) IV  40 mg Intravenous Daily   revefenacin  175 mcg Nebulization Daily   tamsulosin  0.4 mg Oral Daily   docusate sodium, fentaNYL (SUBLIMAZE) injection, ipratropium-albuterol, midazolam, mouth rinse,  polyethylene glycol  Assessment/ Plan:  Riley Stuart is a 71 y.o.  male with past medical history including hypertension, diabetes, renal cell carcinoma, with partial nephrectomy, and chronic kidney disease.  Patient presents to the emergency department from his group home with reports of a facial droop.  Patient has been admitted for Cardiac arrest [facility]) [I46.9] Lactic acidosis [E87.20] Aspiration pneumonia of both lower lobes, unspecified aspiration pneumonia type ([place]) [J69.0]   Acute Kidney Injury on chronic kidney disease stage IV with baseline creatinine 3.4 and GFR of 18 on 01/17/22.  Acute kidney injury secondary to IV contrast nephropathy.  IV contrast exposure on 04/08/2022.  Kidney injury also likely due to lack of perfusion from cardiac arrest. Chronic kidney disease is secondary to renal cell carcinoma with partial left nephrectomy.  Olmesartan and torsemide held.  Avoid nephrotoxic agents and therapies.  We will offer gentle hydration with normal saline at 40 mL/h.  We will order renal ultrasound to evaluate obstruction.  Urine output adequate.  No acute need for dialysis at this time however monitoring closely.   Lab Results  Component Value Date   CREATININE 4.09 (H) 04/10/2022   CREATININE 3.74 (H) 04/09/2022   CREATININE 3.94 (H) 04/08/2022    Intake/Output Summary (Last 24 hours) at 04/10/2022 1557 Last data filed at 04/10/2022 1200 Gross per 24 hour  Intake 675.86 ml  Output 1400 ml  Net -724.14 ml   2.  Hypertension with chronic kidney disease.  Home regimen includes amlodipine-valsartan, carvedilol, olmesartan, and torsemide.  Currently only receiving amlodipine and metoprolol.  3. Diabetes mellitus type II with chronic kidney disease/renal manifestations: insulin dependent. Home regimen includes Humalog 75/25 and NovoLog 30/70. Most recent hemoglobin A1c is 5.7 on 04/09/22.   4.  Acute metabolic acidosis.  Serum bicarb on admission 21.  Currently  19.     LOS: 2   8/15/20233:57 PM

## 2022-04-10 NOTE — Assessment & Plan Note (Addendum)
Patient noted to have consolidations in the right upper and right lower lobe concerning for possible aspiration. Was initially intubated on admission and extubated 08/14 Continue IV antibiotic therapy with Zosyn adjusted to renal function. Duration of antibiotic therapy to be determined

## 2022-04-10 NOTE — Assessment & Plan Note (Signed)
Blood sugar checks every 4 hours Sliding scale insulin

## 2022-04-10 NOTE — Progress Notes (Signed)
Per Vaughan Basta at [name] patients cell phone and wallet are at the facility. Patient is aware and spoke to [place] on to have that confirmed.

## 2022-04-10 NOTE — TOC Initial Note (Signed)
Transition of Care [facility]) - Initial/Assessment Note    Patient Details  Name: Riley Stuart MRN: [id number] Date of Birth: [birth year]  Transition of Care [name]) CM/SW Contact:    Candie Chroman, LCSW Phone Number: 04/10/2022, 12:09 PM  Clinical Narrative:    CSW met with patient. No supports at bedside. He confirmed he is a resident at [facility] and that Annitta Needs is best contact person at the facility. Called [name]. [facility] with a [place] for patients with mental illness. Patient was not receiving home health or using DME prior to admission. No further concerns. CSW encouraged patient and [name] to contact CSW as needed. CSW will continue to follow patient for support and facilitate return to facility once medically stable.              Expected Discharge Plan: Group Home Barriers to Discharge: Continued Medical Work up   Patient Goals and CMS Choice        Expected Discharge Plan and Services Expected Discharge Plan: Group Home     Post Acute Care Choice: Resumption of Svcs/PTA Provider Living arrangements for the past 2 months: Group Home                                      Prior Living Arrangements/Services Living arrangements for the past 2 months: Group Home Lives with:: Facility Resident Patient language and need for interpreter reviewed:: Yes Do you feel safe going back to the place where you live?: Yes      Need for Family Participation in Patient Care: Yes (Comment) Care giver support system in place?: Yes (comment)   Criminal Activity/Legal Involvement Pertinent to Current Situation/Hospitalization: No - Comment as needed  Activities of Daily Living      Permission Sought/Granted Permission sought to share information with : Facility Art therapist granted to share information with : Yes, Verbal Permission Granted  Share Information with NAME: Annitta Needs  Permission granted to share info w AGENCY: [facility]  [place] granted to share info w Relationship: Group Home Caregiver  Permission granted to share info w Contact Information: [phone]  Emotional Assessment Appearance:: Appears stated age Attitude/Demeanor/Rapport: Engaged, Gracious Affect (typically observed): Accepting, Appropriate, Calm, Pleasant Orientation: : Oriented to Self, Oriented to  Time, Oriented to Place, Oriented to Situation Alcohol / Substance Use: Not Applicable Psych Involvement: No (comment)  Admission diagnosis:  Cardiac arrest ([place]) [I46.9] Lactic acidosis [E87.20] Aspiration pneumonia of both lower lobes, unspecified aspiration pneumonia type [facility]) [J69.0] Patient Active Problem List   Diagnosis Date Noted   Cardiac arrest ([place]) 04/08/2022   Acute respiratory failure with hypoxia ([place]) 04/08/2022   Community acquired pneumonia 04/08/2022   NSTEMI (non-ST elevated myocardial infarction) ([place]) 04/08/2022   Aspiration pneumonia of both lower lobes ([place])    Callus 08/14/2021   Plantar flexed metatarsal bone of left foot 08/14/2021   Renal cell carcinoma ([place]) 05/18/2021   Pulmonary infarct ([place]) 05/12/2021   Anemia due to stage 4 chronic kidney disease ([place]) 05/12/2021   Anemia in chronic kidney disease 02/08/2021   Encounter for antineoplastic chemotherapy 01/18/2021   CKD (chronic kidney disease) 01/18/2021   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 [place] [address] Phone: [phone] Fax: [phone]     Social Determinants of Health (SDOH) Interventions    Readmission Risk Interventions    04/10/2022   12:00 PM  Readmission Risk Prevention Plan  PCP or Specialist appointment within 3-5 days of discharge Complete  SW Recovery Care/Counseling Consult Complete  Palliative Care Screening Not Applicable  Skilled Nursing Facility Not Applicable     

## 2022-04-11 DIAGNOSIS — I469 Cardiac arrest, cause unspecified: Secondary | ICD-10-CM | POA: Diagnosis not present

## 2022-04-11 DIAGNOSIS — F209 Schizophrenia, unspecified: Secondary | ICD-10-CM

## 2022-04-11 DIAGNOSIS — Z86711 Personal history of pulmonary embolism: Secondary | ICD-10-CM

## 2022-04-11 LAB — CBC
HCT: 24.5 % — ABNORMAL LOW (ref 39.0–52.0)
Hemoglobin: 8.8 g/dL — ABNORMAL LOW (ref 13.0–17.0)
MCH: 33 pg (ref 26.0–34.0)
MCHC: 35.9 g/dL (ref 30.0–36.0)
MCV: 91.8 fL (ref 80.0–100.0)
Platelets: 122 10*3/uL — ABNORMAL LOW (ref 150–400)
RBC: 2.67 MIL/uL — ABNORMAL LOW (ref 4.22–5.81)
RDW: 16.5 % — ABNORMAL HIGH (ref 11.5–15.5)
WBC: 11.3 10*3/uL — ABNORMAL HIGH (ref 4.0–10.5)
nRBC: 2.1 % — ABNORMAL HIGH (ref 0.0–0.2)

## 2022-04-11 LAB — BASIC METABOLIC PANEL
Anion gap: 7 (ref 5–15)
BUN: 51 mg/dL — ABNORMAL HIGH (ref 8–23)
CO2: 21 mmol/L — ABNORMAL LOW (ref 22–32)
Calcium: 8.3 mg/dL — ABNORMAL LOW (ref 8.9–10.3)
Chloride: 113 mmol/L — ABNORMAL HIGH (ref 98–111)
Creatinine, Ser: 3.81 mg/dL — ABNORMAL HIGH (ref 0.61–1.24)
GFR, Estimated: 16 mL/min — ABNORMAL LOW (ref >60)
Glucose, Bld: 118 mg/dL — ABNORMAL HIGH (ref 70–99)
Potassium: 4.1 mmol/L (ref 3.5–5.1)
Sodium: 141 mmol/L (ref 135–145)

## 2022-04-11 LAB — PHOSPHORUS: Phosphorus: 4 mg/dL (ref 2.5–4.6)

## 2022-04-11 LAB — GLUCOSE, CAPILLARY
Glucose-Capillary: 194 mg/dL — ABNORMAL HIGH (ref 70–99)
Glucose-Capillary: 170 mg/dL — ABNORMAL HIGH (ref 70–99)
Glucose-Capillary: 202 mg/dL — ABNORMAL HIGH (ref 70–99)
Glucose-Capillary: 184 mg/dL — ABNORMAL HIGH (ref 70–99)
Glucose-Capillary: 223 mg/dL — ABNORMAL HIGH (ref 70–99)
Glucose-Capillary: 197 mg/dL — ABNORMAL HIGH (ref 70–99)

## 2022-04-11 LAB — MAGNESIUM: Magnesium: 2.2 mg/dL (ref 1.7–2.4)

## 2022-04-11 MED ORDER — ACETAMINOPHEN 500 MG PO TABS
1000.0000 mg | ORAL_TABLET | Freq: Four times a day (QID) | ORAL | Status: DC | PRN
  Administered 2022-04-11 – 2022-04-16 (×3): 1000 mg via ORAL
  Filled 2022-04-11 (×3): qty 2

## 2022-04-11 MED ORDER — PANTOPRAZOLE SODIUM 40 MG PO TBEC
40.0000 mg | DELAYED_RELEASE_TABLET | Freq: Every day | ORAL | Status: DC
  Administered 2022-04-12 – 2022-04-17 (×6): 40 mg via ORAL
  Filled 2022-04-11 (×6): qty 1

## 2022-04-11 MED ORDER — ATORVASTATIN CALCIUM 10 MG PO TABS
10.0000 mg | ORAL_TABLET | Freq: Every day | ORAL | Status: DC
  Administered 2022-04-11 – 2022-04-17 (×7): 10 mg via ORAL
  Filled 2022-04-11 (×7): qty 1

## 2022-04-11 MED ORDER — INSULIN ASPART 100 UNIT/ML IJ SOLN
0.0000 [IU] | Freq: Three times a day (TID) | INTRAMUSCULAR | Status: DC
  Administered 2022-04-11: 2 [IU] via SUBCUTANEOUS
  Administered 2022-04-11: 3 [IU] via SUBCUTANEOUS
  Administered 2022-04-12: 2 [IU] via SUBCUTANEOUS
  Administered 2022-04-12: 1 [IU] via SUBCUTANEOUS
  Administered 2022-04-12: 3 [IU] via SUBCUTANEOUS
  Administered 2022-04-13: 1 [IU] via SUBCUTANEOUS
  Administered 2022-04-13: 2 [IU] via SUBCUTANEOUS
  Administered 2022-04-13: 3 [IU] via SUBCUTANEOUS
  Administered 2022-04-14 (×2): 1 [IU] via SUBCUTANEOUS
  Administered 2022-04-15: 2 [IU] via SUBCUTANEOUS
  Administered 2022-04-15 – 2022-04-16 (×2): 1 [IU] via SUBCUTANEOUS
  Filled 2022-04-11 (×14): qty 1

## 2022-04-11 NOTE — Progress Notes (Signed)
Some delirium, not unexpected. Resp status stable. Will follow peripherally.  Erskine Emery MD PCCM

## 2022-04-11 NOTE — Progress Notes (Signed)
Progress Note   Patient: Riley Stuart YTK:[id number] DOB: 09-29-1950 DOA: 04/08/2022     3 DOS: the patient was seen and examined on 04/11/2022   Brief hospital course: Riley Stuart is a 71 year old male with a past medical history significant for ruptured cerebral aneurysm with subarachnoid hemorrhage in 2015, hypertension, diabetes mellitus, renal cell carcinoma on chemotherapy, and chronic kidney disease stage IV who presented to to [facility] ED on 04/08/2022 following out-of-hospital cardiac arrest.  Patient is currently intubated and sedated with no family available, therefore history is obtained from chart review.   Per ED and nursing notes, the patient resides at [facility] where he was found unresponsive in a chair with left sided facial drooping by staff member.  Patient did have pulses at that time.  His brother actually talked to on the phone earlier this morning, and seemed to be in his usually state of health.  Fire Department reported he had thready pulses, of which then he lost his pulse.  EMS arrived at 09:50, initial rhythm was asystole, he received 4 rounds of epinephrine with ROSC after about 12 minutes.  EMS attempted to intubate with [place] airway, but were unable to due to gagging.  Patient again lost his pulse, initially asystole and then V-fib for which he received 300 mg of amiodarone and 1 defibrillation.  Upon arrival to the ED, remained pulseless (asystole).  He required another several rounds of CPR and epinephrine.  He was successfully intubated by ED provider, who noted copious secretions.  ROSC briefly obtained, but then again lost pulse  Total estimated time of ACLS is approximately 50 minutes, and while in ED received a total of 4 mg of epinephrine, 1g calcium, and 1 amp bicarb.     Following ROSC, he was noted to be gagging on ETT, therefore low dose propofol started. EDP provider did note continued left facial droop and not responding to pain.  08/15  -patient's care was transferred to [facility] hospitalists and overnight noted to have worsening respiratory distress.  Seen by PCCM and was placed on [place]. Seen during rounds and noted to be tachypneic.  Patient denied any new complaints.  Transferred back to ICU due to respiratory distress and concerns for reintubation.     Assessment and Plan: * Cardiac arrest [facility]) Patient presented after cardiac arrest with initial rhythm of asystole and subsequent rhythms notable for ventricular tachycardia. Received a total of 50 minutes of CPR. Intubated in the emergency room on 04/08/22 and was extubated on 04/09/22. Transferred back to ICU on 815 due to worsening respiratory distress and is currently on HFNC   Aspiration pneumonia of both lower lobes [facility]) Place patient on strict aspiration precautions. Cxr 8/15 with partial collapse rll. Cta 8/13 without signs PE Continue antibiotic therapy, zosyn SLP consulted CT of chest if respiratory status worsens Continue o2  NSTEMI (non-ST elevated myocardial infarction) ([place]) Noted to have elevated troponins in the setting of demand ischemia versus non-ST elevation MI. Twelve-lead EKG with chronic left bundle branch block. Patient was on a heparin drip for 48 hours with goal to transition to Eliquis. Not a candidate for left heart cath due to stage IV chronic kidney disease Patient had a 2D echocardiogram with normal LVEF of 60% Seen by cardiology, plans for ischemic work-up on hold since patient is a poor candidate for left heart cath due to chronic kidney disease with serum creatinine of 4.0. Continue aspirin and metoprolol  Schizophrenia Delirium Has underlying schizophrenia, here appears delirious  2/2 above processes. Group home says he is cogent at baseline. Ct head nothing acute. Lives at a group home, they say he has capacity at baseline to make his medical decisions - cont lamictal  Acute respiratory failure with hypoxia ([place]) Most likely  secondary to pneumonia (community acquired versus aspiration) Initially intubated but has been extubated and is currently on high flow nasal cannula 50%/50L. Seen by PCCM for evaluation of respiratory distress and started on systemic steroids and bronchodilator therapy. 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 Continue Eliquis Hold Cabozantinib  CKD (chronic kidney disease) stage 4, GFR 15-29 ml/min ([name]) Patient with a history of stage IV chronic kidney disease now worsened and most likely related to contrast patient received for CT angio done on admission to rule out pulmonary embolism Gfr stable in the upper teens Noted to have mild metabolic acidosis. Nephrology following  Type 2 diabetes mellitus with diabetic chronic kidney disease ([name]) SSI  Thrombocytopenia Anemia Plts 120s, hgb 8.8, no report of bleeding - monitor for now  Hx PE Cont eliquis    Subjective: confused but alert, mild chest wall pain, eating breakfast  Physical Exam: Vitals:   04/11/22 0610 04/11/22 0700 04/11/22 0800 04/11/22 0805  BP: (!) 161/97 (!) 145/78 (!) 154/109   Pulse: 72 (!) 108 100   Resp: 20 19 (!) 27   Temp:   97.8 F (36.6 C)   TempSrc:   Oral   SpO2: 95% 91% 92% 96%  Weight:      Height:       General: Elderly gentleman, chronically ill appearing, on high flow nasal cannula, very talkative HEENT: [place]/AT, moist mucous membranes, sclera anicteric.   Neuro: Moving all extremities CV: rrr, s1s2, no murmurs PULM: Scattered rhonchi and wheezes in all lung fields GI: soft, non-tender, non-distended, BS+ Extremities: warm, no edema   Data Reviewed: Relevant notes from primary care and specialist visits, past discharge summaries as available in EHR, including Care  Everywhere. Prior diagnostic testing as pertinent to current admission diagnoses Updated medications and problem lists for reconciliation ED course, including vitals, labs, imaging, treatment and response to treatment Triage notes, nursing and pharmacy notes and ED provider's notes Notable results as noted in HPI  There are no new results to review at this time.  Family Communication: no answer when brother brent called today.  Disposition: Status is: Inpatient Remains inpatient appropriate because: requiring high flow [place]  Planned Discharge Destination: To be determined   Author: Desma Maxim, MD 04/11/2022 9:40 AM  For on call review [email or web address].

## 2022-04-11 NOTE — Progress Notes (Signed)
[facility]  ROUNDING NOTE   Subjective:   Riley Stuart is a 71 y.o. male with past medical history including hypertension, diabetes, renal cell carcinoma, with partial nephrectomy, and chronic kidney disease.  Patient presents to the emergency department from his group home with reports of a facial droop.  Patient has been admitted for Cardiac arrest [facility]) [I46.9] Lactic acidosis [E87.20] Aspiration pneumonia of both lower lobes, unspecified aspiration pneumonia type [name]) [J69.0]  Patient known to our practice and is followed outpatient by Dr. Juleen China.  Patient was last seen in the office 01/24/2022 by Dr. Holley Raring for routine follow-up.    Patient seen sitting up in bed, anxious Alert and oriented to self and place Safety sitter at bedside Patient remains on high flow nasal cannula, audible wheeze No lower extremity edema Denies pain or discomfort   Objective:  Vital signs in last 24 hours:  Temp:  [97.7 F (36.5 C)-98.7 F (37.1 C)] 97.8 F (36.6 C) (08/16 0800) Pulse Rate:  [71-128] 100 (08/16 0800) Resp:  [17-37] 27 (08/16 0800) BP: (97-187)/(56-112) 154/109 (08/16 0800) SpO2:  [74 %-100 %] 96 % (08/16 0805) FiO2 (%):  [40 %-50 %] 50 % (08/16 0805) Weight:  [101.1 kg] 101.1 kg (08/16 0410)  Weight change: -0.7 kg Filed Weights   04/09/22 0409 04/10/22 0601 04/11/22 0410  Weight: 101.7 kg 101.8 kg 101.1 kg    Intake/Output: I/O last 3 completed shifts: In: 1366.2 [P.O.:320; I.V.:748.6; IV Piggyback:297.6] Out: 2650 [Urine:2650]   Intake/Output this shift:  Total I/O In: 40 [I.V.:40] Out: -   Physical Exam: General: NAD, restless  Head: Normocephalic, atraumatic. Moist oral mucosal membranes  Eyes: Anicteric  Lungs:  Diminished in bases, expiratory wheeze, mild tachypnea, HFNC  Heart: Regular rate and rhythm  Abdomen:  Soft, nontender, nondistended  Extremities: No peripheral edema.  Neurologic: Nonfocal, moving all four extremities   Skin: No lesions  Access: None    Basic Metabolic Panel: Recent Labs  Lab 04/08/22 1108 04/09/22 0213 04/10/22 0506 04/11/22 0353  NA 143 143 138 141  K 3.8 3.8 3.8 4.1  CL 112* 113* 108 113*  CO2 21* 19* 19* 21*  GLUCOSE 145* 94 187* 118*  BUN 27* 37* 42* 51*  CREATININE 3.94* 3.74* 4.09* 3.81*  CALCIUM 9.3 8.5* 8.6* 8.3*  MG 1.7 1.8 2.1 2.2  PHOS 5.8* 5.2* 4.1 4.0     Liver Function Tests: Recent Labs  Lab 04/08/22 1108 04/09/22 0213  AST 60* 137*  ALT 37 67*  ALKPHOS 55 36*  BILITOT 0.8 0.7  PROT 6.1* 5.6*  ALBUMIN 3.0* 2.8*    No results for input(s): "LIPASE", "AMYLASE" in the last 168 hours. No results for input(s): "AMMONIA" in the last 168 hours.  CBC: Recent Labs  Lab 04/08/22 1108 04/09/22 0213 04/10/22 0506 04/11/22 0353  WBC 10.3 12.1* 12.4* 11.3*  NEUTROABS 5.6  --   --   --   HGB 11.6* 11.3* 10.4* 8.8*  HCT 35.0* 32.1* 29.7* 24.5*  MCV 99.7 93.6 93.1 91.8  PLT 219 206 141* 122*     Cardiac Enzymes: No results for input(s): "CKTOTAL", "CKMB", "CKMBINDEX", "TROPONINI" in the last 168 hours.  BNP: Invalid input(s): "POCBNP"  CBG: Recent Labs  Lab 04/10/22 1915 04/10/22 2317 04/11/22 0359 04/11/22 0746 04/11/22 1125  GLUCAP 212* 165* 194* 170* 103*     Microbiology: Results for orders placed or performed during the hospital encounter of 04/08/22  Urine Culture     Status: None  Collection Time: 04/08/22  1:37 PM   Specimen: Urine, Random  Result Value Ref Range Status   Specimen Description   Final    URINE, RANDOM Performed at [facility], [address] [place], [place] [address]    Special Requests   Final    NONE Performed at [name], [address] [place], [place] [address]    Culture   Final    NO GROWTH Performed at [facility], [place] [address], [place], [place] [address]    Report Status 04/09/2022 FINAL  Final  MRSA Next Gen by PCR, Nasal     Status: None   Collection  Time: 04/08/22  3:00 PM   Specimen: Nasal Mucosa; Nasal Swab  Result Value Ref Range Status   MRSA by PCR Next Gen NOT DETECTED NOT DETECTED Final    Comment: (NOTE) The GeneXpert MRSA Assay (FDA approved for NASAL specimens only), is one component of a comprehensive MRSA colonization surveillance program. It is not intended to diagnose MRSA infection nor to guide or monitor treatment for MRSA infections. Test performance is not FDA approved in patients less than 30 years old. Performed at [facility], [address] [place], [place] [address]   Culture, blood (Routine X 2) w Reflex to ID Panel     Status: None (Preliminary result)   Collection Time: 04/08/22  6:01 PM   Specimen: BLOOD  Result Value Ref Range Status   Specimen Description BLOOD BLOOD LEFT FOREARM  Final   Special Requests IN PEDIATRIC BOTTLE Blood Culture adequate volume  Final   Culture   Final    NO GROWTH 3 DAYS Performed at [facility], [address] [place], [place] [address]    Report Status PENDING  Incomplete  Culture, blood (Routine X 2) w Reflex to ID Panel     Status: None (Preliminary result)   Collection Time: 04/08/22  6:01 PM   Specimen: BLOOD  Result Value Ref Range Status   Specimen Description BLOOD BLOOD LEFT HAND  Final   Special Requests   Final    BOTTLES DRAWN AEROBIC AND ANAEROBIC Blood Culture adequate volume   Culture   Final    NO GROWTH 3 DAYS Performed at [name], [address] [place], [place] [address]    Report Status PENDING  Incomplete    Coagulation Studies: No results for input(s): "LABPROT", "INR" in the last 72 hours.   Urinalysis: Recent Labs    04/08/22 1325  COLORURINE YELLOW*  LABSPEC 1.010  PHURINE 5.0  GLUCOSEU 50*  HGBUR SMALL*  BILIRUBINUR NEGATIVE  KETONESUR NEGATIVE  PROTEINUR 100*  NITRITE NEGATIVE  LEUKOCYTESUR NEGATIVE       Imaging: US RENAL  Result Date: 04/10/2022 CLINICAL DATA:  Acute renal  failure. History of partial left nephrectomy for renal carcinoma. EXAM: RENAL / URINARY TRACT ULTRASOUND COMPLETE COMPARISON:  11/24/2018 FINDINGS: Right Kidney: Renal measurements: 12.6 x 5.4 x 6.2 cm = volume: 219 mL. Increased cortical echogenicity. No hydronephrosis. Simple cysts identified with the largest measuring just under 4 cm. Left Kidney: Renal measurements: 9.8 x 7.3 x 7.8 cm = volume: 291 mL. Increased cortical echogenicity. No hydronephrosis. Multiple simple cysts identified with the largest measuring approximately 3.4 cm. Bladder: Appears normal for degree of bladder distention. Other: None. IMPRESSION: Multiple renal cysts bilaterally. Both kidneys demonstrate increased cortical echogenicity without evidence of hydronephrosis. Electronically Signed   By: Aletta Edouard M.D.   On: 04/10/2022 17:53  DG Chest Port 1 View  Result Date: 04/10/2022 CLINICAL DATA:  Worsening difficulty breathing EXAM: PORTABLE CHEST 1 VIEW COMPARISON:  04/09/2022 FINDINGS: Cardiomegaly with left ventricular prominence as seen previously. Aortic atherosclerosis. Pulmonary venous hypertension without frank edema. Partial collapse of the right lower lobe persist. Minimal patchy atelectasis at the left base. No worsened finding. IMPRESSION: Similar radiographic appearance. Partial collapse of the right lower lobe. Minimal patchy atelectasis at the left base. Electronically Signed   By: Nelson Chimes M.D.   On: 04/10/2022 07:43   DG Chest Port 1 View  Result Date: 04/09/2022 CLINICAL DATA:  Cardiac arrest. EXAM: PORTABLE CHEST 1 VIEW COMPARISON:  Chest x-ray 04/08/2022 FINDINGS: Endotracheal tube and enteric tube have been removed. The cardiac silhouette is within normal limits. Mediastinal silhouette appears stable. Right mid and lower lung airspace disease has decreased, but has not completely resolved. Costophrenic angles are clear. No pneumothorax. Left-sided rib fractures are again noted. IMPRESSION: 1.  Endotracheal and enteric tubes have been removed. 2. Decreasing right lung airspace disease. Electronically Signed   By: Ronney Asters M.D.   On: 04/09/2022 17:07     Medications:    sodium chloride Stopped (04/08/22 1515)   sodium chloride 40 mL/hr at 04/11/22 0800   piperacillin-tazobactam (ZOSYN)  IV 3.375 g (04/11/22 0619)    amLODipine  10 mg Oral Daily   apixaban  2.5 mg Oral BID   arformoterol  15 mcg Nebulization BID   aspirin EC  81 mg Oral Daily   atorvastatin  10 mg Oral Daily   budesonide (PULMICORT) nebulizer solution  0.5 mg Nebulization BID   Chlorhexidine Gluconate Cloth  6 each Topical Q0600   insulin aspart  0-9 Units Subcutaneous TID WC   lamoTRIgine  100 mg Oral BID   methylPREDNISolone (SOLU-MEDROL) injection  120 mg Intravenous Q24H   metoprolol tartrate  25 mg Oral BID   mouth rinse  15 mL Mouth Rinse QID   [START ON 04/12/2022] pantoprazole  40 mg Oral Daily   revefenacin  175 mcg Nebulization Daily   tamsulosin  0.4 mg Oral Daily   acetaminophen, docusate sodium, fentaNYL (SUBLIMAZE) injection, ipratropium-albuterol, midazolam, mouth rinse, polyethylene glycol  Assessment/ Plan:  [name] is a 71 y.o.  male with past medical history including hypertension, diabetes, renal cell carcinoma, with partial nephrectomy, and chronic kidney disease.  Patient presents to the emergency department from his group home with reports of a facial droop.  Patient has been admitted for Cardiac arrest [facility]) [I46.9] Lactic acidosis [E87.20] Aspiration pneumonia of both lower lobes, unspecified aspiration pneumonia type ([place]) [J69.0]   Acute Kidney Injury on chronic kidney disease stage IV with baseline creatinine 3.4 and GFR of 18 on 01/17/22.  Acute kidney injury secondary to IV contrast nephropathy.  IV contrast exposure on 04/08/2022.  Kidney injury also likely due to lack of perfusion from cardiac arrest. Chronic kidney disease is secondary to renal cell  carcinoma with partial left nephrectomy.  Olmesartan and torsemide held.  Avoid nephrotoxic agents and therapies.    Creatinine has improved slightly with IV fluids.  Continue fluids at this time.  Recorded urine output of 2.1 L in previous 24 hours.  Continue to avoid nephrotoxic agents and therapies.  No immediate need for dialysis at this time.  We will monitor labs and urine output.   Lab Results  Component Value Date   CREATININE 3.81 (H) 04/11/2022   CREATININE 4.09 (H) 04/10/2022   CREATININE 3.74 (H) 04/09/2022  Intake/Output Summary (Last 24 hours) at 04/11/2022 1132 Last data filed at 04/11/2022 0800 Gross per 24 hour  Intake 850.42 ml  Output 1250 ml  Net -399.58 ml    2.  Hypertension with chronic kidney disease.  Home regimen includes amlodipine-valsartan, carvedilol, olmesartan, and torsemide.  Currently only receiving amlodipine and metoprolol.  Blood pressure currently 154/109.  3. Diabetes mellitus type II with chronic kidney disease/renal manifestations: insulin dependent. Home regimen includes Humalog 75/25 and NovoLog 30/70. Most recent hemoglobin A1c is 5.7 on 04/09/22.   Glucose elevated at times.  Primary team to manage sliding scale insulin.  4.  Acute metabolic acidosis.  Serum bicarb on admission 21.  Slowly improved, 21 today.  5.  Aspiration pneumonia.  Currently on high flow nasal cannula and receiving antibiotics.   LOS: 3   8/16/202311:32 AM

## 2022-04-11 NOTE — Progress Notes (Signed)
Progress Note  Patient Name: Riley Stuart Date of Encounter: 04/11/2022  [name] Cardiologist: Nelva Bush, MD   Subjective   Patient seen on AM rounds. Remains on heated high flow oxygen. Has respiratory distress with rapid response called on 04/10/22 and was moved back to the ICU. Has been pulling off/out medical equipment all night. Confused on exam. Oriented x 1. Started on gentle hydration by Nephrology yesterday. 1.3 L output in the last 24 hours. Serum creatinine with some improved noted at 3.81 down from 4.09. Hgb noted at 8.8 from 10.4.   Inpatient Medications    Scheduled Meds:  amLODipine  10 mg Oral Daily   apixaban  2.5 mg Oral BID   arformoterol  15 mcg Nebulization BID   aspirin EC  81 mg Oral Daily   budesonide (PULMICORT) nebulizer solution  0.5 mg Nebulization BID   Chlorhexidine Gluconate Cloth  6 each Topical Q0600   insulin aspart  0-9 Units Subcutaneous Q4H   lamoTRIgine  100 mg Oral BID   methylPREDNISolone (SOLU-MEDROL) injection  120 mg Intravenous Q24H   metoprolol tartrate  25 mg Oral BID   mouth rinse  15 mL Mouth Rinse QID   pantoprazole (PROTONIX) IV  40 mg Intravenous Daily   revefenacin  175 mcg Nebulization Daily   tamsulosin  0.4 mg Oral Daily   Continuous Infusions:  sodium chloride Stopped (04/08/22 1515)   sodium chloride Stopped (04/10/22 1351)   piperacillin-tazobactam (ZOSYN)  IV 3.375 g (04/11/22 0619)   PRN Meds: acetaminophen, docusate sodium, fentaNYL (SUBLIMAZE) injection, ipratropium-albuterol, midazolam, mouth rinse, polyethylene glycol   Vital Signs    Vitals:   04/11/22 0400 04/11/22 0410 04/11/22 0610 04/11/22 0805  BP: (!) 142/66  (!) 161/97   Pulse: 71  72   Resp: 19  20   Temp:      TempSrc:      SpO2: 96%  95% 96%  Weight:  101.1 kg    Height:        Intake/Output Summary (Last 24 hours) at 04/11/2022 0809 Last data filed at 04/11/2022 0657 Gross per 24 hour  Intake 810.42 ml  Output 2150  ml  Net -1339.58 ml      04/11/2022    4:10 AM 04/10/2022    6:01 AM 04/09/2022    4:09 AM  Last 3 Weights  Weight (lbs) 222 lb 14.2 oz 224 lb 6.9 oz 224 lb 3.3 oz  Weight (kg) 101.1 kg 101.8 kg 101.7 kg      Telemetry    Sinus to sinus tach rates of 70- 106 with chronic LBBB - Personally Reviewed  ECG    No new tracings - Personally Reviewed  Physical Exam   GEN: No acute distress.   Neck: No JVD Cardiac: RRR, no murmurs, rubs, or gallops.  Respiratory: Coarse to auscultation bilaterally. Respirations are unlabored on HHFNC GI: Soft, nontender, non-distended  MS: No edema; No deformity. 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   Psych: Normal affect   Labs    High Sensitivity Troponin:   Recent Labs  Lab 04/08/22 1108 04/08/22 1325 04/08/22 1601 04/08/22 1801 04/09/22 0213  TROPONINIHS 95* 535* 913* 1,131* 716*     Chemistry Recent Labs  Lab 04/08/22 1108 04/09/22 0213 04/10/22 0506 04/11/22 0353  NA 143 143 138 141  K 3.8 3.8 3.8 4.1  CL 112* 113* 108 113*  CO2 21* 19* 19* 21*  GLUCOSE 145* 94 187*  118*  BUN 27* 37* 42* 51*  CREATININE 3.94* 3.74* 4.09* 3.81*  CALCIUM 9.3 8.5* 8.6* 8.3*  MG 1.7 1.8 2.1 2.2  PROT 6.1* 5.6*  --   --   ALBUMIN 3.0* 2.8*  --   --   AST 60* 137*  --   --   ALT 37 67*  --   --   ALKPHOS 55 36*  --   --   BILITOT 0.8 0.7  --   --   GFRNONAA 15* 16* 15* 16*  ANIONGAP '10 11 11 7    '$ Lipids  Recent Labs  Lab 04/09/22 0213  CHOL 86  TRIG 114  110  HDL 26*  LDLCALC 37  CHOLHDL 3.3    Hematology Recent Labs  Lab 04/09/22 0213 04/10/22 0506 04/11/22 0353  WBC 12.1* 12.4* 11.3*  RBC 3.43* 3.19* 2.67*  HGB 11.3* 10.4* 8.8*  HCT 32.1* 29.7* 24.5*  MCV 93.6 93.1 91.8  MCH 32.9 32.6 33.0  MCHC 35.2 35.0 35.9  RDW 17.1* 16.7* 16.5*  PLT 206 141* 122*   Thyroid  Recent Labs  Lab 04/09/22 0213  TSH 0.794    BNP Recent Labs  Lab 04/08/22 1325  BNP 125.4*     DDimer No results for input(s): "DDIMER" in the last 168 hours.   Radiology    US RENAL  Result Date: 04/10/2022 CLINICAL DATA:  Acute renal failure. History of partial left nephrectomy for renal carcinoma. EXAM: RENAL / URINARY TRACT ULTRASOUND COMPLETE COMPARISON:  11/24/2018 FINDINGS: Right Kidney: Renal measurements: 12.6 x 5.4 x 6.2 cm = volume: 219 mL. Increased cortical echogenicity. No hydronephrosis. Simple cysts identified with the largest measuring just under 4 cm. Left Kidney: Renal measurements: 9.8 x 7.3 x 7.8 cm = volume: 291 mL. Increased cortical echogenicity. No hydronephrosis. Multiple simple cysts identified with the largest measuring approximately 3.4 cm. Bladder: Appears normal for degree of bladder distention. Other: None. IMPRESSION: Multiple renal cysts bilaterally. Both kidneys demonstrate increased cortical echogenicity without evidence of hydronephrosis. Electronically Signed   By: Aletta Edouard M.D.   On: 04/10/2022 17:53   DG Chest Port 1 View  Result Date: 04/10/2022 CLINICAL DATA:  Worsening difficulty breathing EXAM: PORTABLE CHEST 1 VIEW COMPARISON:  04/09/2022 FINDINGS: Cardiomegaly with left ventricular prominence as seen previously. Aortic atherosclerosis. Pulmonary venous hypertension without frank edema. Partial collapse of the right lower lobe persist. Minimal patchy atelectasis at the left base. No worsened finding. IMPRESSION: Similar radiographic appearance. Partial collapse of the right lower lobe. Minimal patchy atelectasis at the left base. Electronically Signed   By: Nelson Chimes M.D.   On: 04/10/2022 07:43   DG Chest Port 1 View  Result Date: 04/09/2022 CLINICAL DATA:  Cardiac arrest. EXAM: PORTABLE CHEST 1 VIEW COMPARISON:  Chest x-ray 04/08/2022 FINDINGS: Endotracheal tube and enteric tube have been removed. The cardiac silhouette is within normal limits. Mediastinal silhouette appears stable. Right mid and lower lung airspace disease has decreased,  but has not completely resolved. Costophrenic angles are clear. No pneumothorax. Left-sided rib fractures are again noted. IMPRESSION: 1. Endotracheal and enteric tubes have been removed. 2. Decreasing right lung airspace disease. Electronically Signed   By: Ronney Asters M.D.   On: 04/09/2022 17:07   ECHOCARDIOGRAM COMPLETE  Result Date: 04/09/2022    ECHOCARDIOGRAM REPORT   Patient Name:   Riley Stuart Date of Exam: 04/09/2022 Medical Rec #:  [id number]             Height:  72.0 in Accession #:    [id number]            Weight:       224.2 lb Date of Birth:  10-29-1950             BSA:          2.237 m Patient Age:    25 years              BP:           92/64 mmHg Patient Gender: M                     HR:           85 bpm. Exam Location:  [facility] Procedure: 2D Echo, Cardiac Doppler and Color Doppler Indications:     I46.9 Cardiac arrest  History:         Patient has no prior history of Echocardiogram examinations.                  Renal cell carcinoma; Risk Factors:Diabetes, Current Smoker and                  Dyslipidemia.  Sonographer:     Rosalia Hammers Referring Phys:  [id number] Bradly Bienenstock Diagnosing Phys: Nelva Bush MD  Sonographer Comments: Technically difficult study due to poor echo windows. Image acquisition challenging due to respiratory motion. IMPRESSIONS  1. Left ventricular ejection fraction, by estimation, is 60 to 65%. The left ventricle has normal function. Left ventricular endocardial border not optimally defined to evaluate regional wall motion. There is severe left ventricular hypertrophy. Left ventricular diastolic parameters are indeterminate.  2. Right ventricular systolic function is normal. The right ventricular size is normal.  3. Left atrial size was mildly dilated.  4. Right atrial size was mildly dilated.  5. The mitral valve is normal in structure. Trivial mitral valve regurgitation.  6. The aortic valve has an indeterminant number of cusps. There is mild  calcification of the aortic valve. There is mild thickening of the aortic valve. Aortic valve regurgitation is not visualized. Aortic valve sclerosis is present, with no evidence of  aortic valve stenosis.  7. Aortic dilatation noted. There is mild dilatation of the ascending aorta, measuring 38 mm.  8. The inferior vena cava is normal in size with <50% respiratory variability, suggesting right atrial pressure of 8 mmHg. FINDINGS  Left Ventricle: Left ventricular ejection fraction, by estimation, is 60 to 65%. The left ventricle has normal function. Left ventricular endocardial border not optimally defined to evaluate regional wall motion. The left ventricular internal cavity size was normal in size. There is severe left ventricular hypertrophy. Abnormal (paradoxical) septal motion, consistent with left bundle branch block. Left ventricular diastolic parameters are indeterminate. Right Ventricle: The right ventricular size is normal. No increase in right ventricular wall thickness. Right ventricular systolic function is normal. Left Atrium: Left atrial size was mildly dilated. Right Atrium: Right atrial size was mildly dilated. Pericardium: Trivial pericardial effusion is present. Mitral Valve: The mitral valve is normal in structure. Trivial mitral valve regurgitation. Tricuspid Valve: The tricuspid valve is normal in structure. Tricuspid valve regurgitation is trivial. Aortic Valve: The aortic valve has an indeterminant number of cusps. There is mild calcification of the aortic valve. There is mild thickening of the aortic valve. Aortic valve regurgitation is not visualized. Aortic valve sclerosis is present, with no evidence of aortic valve stenosis. Aortic valve mean gradient measures 7.0  mmHg. Aortic valve peak gradient measures 11.0 mmHg. Aortic valve area, by VTI measures 1.80 cm. Pulmonic Valve: The pulmonic valve was normal in structure. Pulmonic valve regurgitation is trivial. Aorta: Aortic dilatation  noted. There is mild dilatation of the ascending aorta, measuring 38 mm. Venous: The inferior vena cava is normal in size with less than 50% respiratory variability, suggesting right atrial pressure of 8 mmHg. IAS/Shunts: The interatrial septum was not well visualized.  LEFT VENTRICLE PLAX 2D LVIDd:         3.48 cm LVIDs:         2.10 cm LV PW:         1.72 cm LV IVS:        1.72 cm LVOT diam:     1.70 cm LV SV:         49 LV SV Index:   22 LVOT Area:     2.27 cm  RIGHT VENTRICLE RV Basal diam:  4.50 cm RV S prime:     19.10 cm/s TAPSE (M-mode): 3.3 cm LEFT ATRIUM           Index        RIGHT ATRIUM           Index LA diam:      3.30 cm 1.48 cm/m   RA Area:     21.80 cm LA Vol (A2C): 40.7 ml 18.20 ml/m  RA Volume:   69.80 ml  31.21 ml/m LA Vol (A4C): 67.7 ml 30.27 ml/m  AORTIC VALVE AV Area (Vmax):    1.93 cm AV Area (Vmean):   1.77 cm AV Area (VTI):     1.80 cm AV Vmax:           166.00 cm/s AV Vmean:          120.000 cm/s AV VTI:            0.271 m AV Peak Grad:      11.0 mmHg AV Mean Grad:      7.0 mmHg LVOT Vmax:         141.00 cm/s LVOT Vmean:        93.600 cm/s LVOT VTI:          0.215 m LVOT/AV VTI ratio: 0.79  AORTA Ao Root diam: 3.20 cm MITRAL VALVE MV Area (PHT): 4.86 cm     SHUNTS MV Decel Time: 156 msec     Systemic VTI:  0.22 m MV E velocity: 140.00 cm/s  Systemic Diam: 1.70 cm Nelva Bush MD Electronically signed by Nelva Bush MD Signature Date/Time: 04/09/2022/1:47:18 PM    Final     Cardiac Studies   Echo 04/10/22   1. Left ventricular ejection fraction, by estimation, is 60 to 65%. The  left ventricle has normal function. Left ventricular endocardial border  not optimally defined to evaluate regional wall motion. There is severe  left ventricular hypertrophy. Left  ventricular diastolic parameters are indeterminate.   2. Right ventricular systolic function is normal. The right ventricular  size is normal.   3. Left atrial size was mildly dilated.   4. Right atrial size  was mildly dilated.   5. The mitral valve is normal in structure. Trivial mitral valve  regurgitation.   6. The aortic valve has an indeterminant number of cusps. There is mild  calcification of the aortic valve. There is mild thickening of the aortic  valve. Aortic valve regurgitation is not visualized. Aortic valve  sclerosis is present, with no evidence  of   aortic valve stenosis.   7. Aortic dilatation noted. There is mild dilatation of the ascending  aorta, measuring 38 mm.   8. The inferior vena cava is normal in size with <50% respiratory  variability, suggesting right atrial pressure of 8 mmHg.   Patient Profile     71 y.o. male with a history of chronic kidney disease stage IV, diabetes, hypertension, renal call carcinoma on oral chemotherapy, schizoaffective disorder, h/o pulmonary embolism on chronic anticoagulation with apixaban, ruptured cerebral aneurysm with subarachnoid hemorrhage, who is been seen and evaluated for out of hospital cardiac arrest.   Assessment & Plan    Out of hospital cardiac arrest with unknown etiology; elevated hs troponins in the setting of demand ischemia vs NSTEMI - ACLS time approximately 50 minutes to obtain ROSC - Hs troponins 95, 535, 913, 1131, and 716 - heparin infusion completed after 48 hours and discontinued - continue asa 81 mg daily - EKG with chronic LBBB - Echocardiogram with normal LVEF 60 % - currently requires no pressors - plans for ischemic work-up on hold for now, poor candidate for cardiac catheterization with serum creatinine 3.81, likely unable to lay still and or flat for Myoview in the setting of respiratory distress and confusion, will re-evaluate daily   Acute hypoxic/hypercapnic respiratory failure/ Aspiration vs CAP - CXR done 04/10/22 revealed partial collapse of the right lower lobe - recommend repeat CXR with continued increased work of breathing - patient currently on heated high flow oxygen 40L/min or 50% -  extubated 04/09/22 - rapid response called 04/10/22, patient transferred back to ICU for respiratory distress with potential need for re-intubation - continued on antibiotics, steroids, and nebs - management per CCM  AKI on CKD IV - serum creatinine improved to 3.81 - gentle hydration started yesterday - Nephrology following - olmesartan and torsemide continues to be held - renal ultrasound completed without evidence of hydronephrosis with increased cortical echogenicity - avoid nephrotoxic angents when able - daily bmp - monitor urine output - monitor/trend/replace electrolytes as needed  Diabetes - A1c 5.7 - continue insulin - management per CCM  Acute metabolic encephalopathy with a h/o schizoaffective disorder - avoid sedating medication - supportive care  History of pulmonary embolism - currently on apixaban 2.5 mg bid  - no active bleeding - CT of the chest negative for PE but did show consolidations in the RUL and RLL  7. Anemia - hgb this am 8.8 - yesterday hgb 10.4 - no active bleeding - starting on IVF - daily cbc - recommend keeping hgb 8 or better - if continued drop in hgb noted consider FOBT and GI evaluation     For questions or updates, please contact [place] Please consult [email or web address] for contact info under        Signed, Christean Silvestri, NP  04/11/2022, 8:09 AM

## 2022-04-11 NOTE — Progress Notes (Signed)
Speech Language Pathology Treatment: Dysphagia  Patient Details Name: Riley Stuart MRN: [id number] DOB: 03-05-1951 Today's Date: 04/11/2022 Time: 1140-1150 SLP Time Calculation (min) (ACUTE ONLY): 10 min  Assessment / Plan / Recommendation Clinical Impression  Pt seen for ongoing diet toleration. Of note, pt was transferred to ICU d/t respiratory decline. Pt remains on HFNC, is talkative. More specifically, he is very talkative when consuming POs, very distractible and requires moderate cues for redirection to eating/drinking. Given his cognitive deficits and the fact that he is consuming 100% of dysphagia 2 (per nursing report "it makes him happy"), recommend pt remain on currently prescribed diet. ST intervention is no longer indicated at this time.    HPI HPI: Per H&P "Riley Stuart is a 71 year old male with a past medical history significant for ruptured cerebral aneurysm with subarachnoid hemorrhage in 2015, hypertension, diabetes mellitus, renal cell carcinoma on chemotherapy, and chronic kidney disease stage IV who presented to to [facility] ED on 04/08/2022 following out-of-hospital cardiac arrest.  Patient is currently intubated and sedated with no family available, therefore history is obtained from chart review.     Per ED and nursing notes, the patient resides at [facility] where he was found unresponsive in a chair with left sided facial drooping by staff member.  Patient did have pulses at that time.  His brother actually talked to on the phone earlier this morning, and seemed to be in his usually state of health.  Fire Department reported he had thready pulses, of which then he lost his pulse.  EMS arrived at 09:50, initial rhythm was asystole, he received 4 rounds of epinephrine with ROSC after about 12 minutes.  EMS attempted to intubate with [name] airway, but were unable to due to gagging.  Patient again lost his pulse, initially asystole and then V-fib for which he  received 300 mg of amiodarone and 1 defibrillation.  Upon arrival to the ED, remained pulseless (asystole).  He required another several rounds of CPR and epinephrine.  He was successfully intubated by ED provider, who noted copious secretions.  ROSC briefly obtained, but then again lost pulse  Total estimated time of ACLS is approximately 50 minutes, and while in ED received a total of 4 mg of epinephrine, 1g calcium, and 1 amp bicarb.       Following ROSC, he was noted to be gagging on ETT, therefore low dose propofol started. EDP provider did note continued left facial droop and not responding to pain."      SLP Plan  All goals met      Recommendations for follow up therapy are one component of a multi-disciplinary discharge planning process, led by the attending physician.  Recommendations may be updated based on patient status, additional functional criteria and insurance authorization.    Recommendations  Diet recommendations: Dysphagia 2 (fine chop);Thin liquid Liquids provided via: Cup Medication Administration: Whole meds with puree Supervision: Staff to assist with self feeding;Full supervision/cueing for compensatory strategies Compensations: Minimize environmental distractions;Slow rate;Small sips/bites Postural Changes and/or Swallow Maneuvers: Seated upright 90 degrees;Upright 30-60 min after meal                Oral Care Recommendations: Oral care BID Follow Up Recommendations: No SLP follow up Assistance recommended at discharge: Frequent or constant Supervision/Assistance SLP Visit Diagnosis: Dysphagia, oral phase (R13.11) Plan: All goals met           Riley Stuart  04/11/2022, 3:00 PM

## 2022-04-12 DIAGNOSIS — J690 Pneumonitis due to inhalation of food and vomit: Secondary | ICD-10-CM | POA: Diagnosis not present

## 2022-04-12 DIAGNOSIS — J9601 Acute respiratory failure with hypoxia: Secondary | ICD-10-CM | POA: Diagnosis not present

## 2022-04-12 DIAGNOSIS — I469 Cardiac arrest, cause unspecified: Secondary | ICD-10-CM | POA: Diagnosis not present

## 2022-04-12 DIAGNOSIS — F209 Schizophrenia, unspecified: Secondary | ICD-10-CM

## 2022-04-12 DIAGNOSIS — N184 Chronic kidney disease, stage 4 (severe): Secondary | ICD-10-CM | POA: Diagnosis not present

## 2022-04-12 LAB — CBC
HCT: 24.1 % — ABNORMAL LOW (ref 39.0–52.0)
Hemoglobin: 8.6 g/dL — ABNORMAL LOW (ref 13.0–17.0)
MCH: 32.6 pg (ref 26.0–34.0)
MCHC: 35.7 g/dL (ref 30.0–36.0)
MCV: 91.3 fL (ref 80.0–100.0)
Platelets: 137 10*3/uL — ABNORMAL LOW (ref 150–400)
RBC: 2.64 MIL/uL — ABNORMAL LOW (ref 4.22–5.81)
RDW: 16.8 % — ABNORMAL HIGH (ref 11.5–15.5)
WBC: 10.8 10*3/uL — ABNORMAL HIGH (ref 4.0–10.5)
nRBC: 2.6 % — ABNORMAL HIGH (ref 0.0–0.2)

## 2022-04-12 LAB — BASIC METABOLIC PANEL
Anion gap: 6 (ref 5–15)
BUN: 51 mg/dL — ABNORMAL HIGH (ref 8–23)
CO2: 22 mmol/L (ref 22–32)
Calcium: 8.3 mg/dL — ABNORMAL LOW (ref 8.9–10.3)
Chloride: 112 mmol/L — ABNORMAL HIGH (ref 98–111)
Creatinine, Ser: 3.48 mg/dL — ABNORMAL HIGH (ref 0.61–1.24)
GFR, Estimated: 18 mL/min — ABNORMAL LOW (ref >60)
Glucose, Bld: 134 mg/dL — ABNORMAL HIGH (ref 70–99)
Potassium: 4.1 mmol/L (ref 3.5–5.1)
Sodium: 140 mmol/L (ref 135–145)

## 2022-04-12 LAB — BLOOD GAS, ARTERIAL
Acid-base deficit: 3 mmol/L — ABNORMAL HIGH (ref 0.0–2.0)
Bicarbonate: 21.1 mmol/L (ref 20.0–28.0)
O2 Content: 10 L/min
O2 Saturation: 97.6 %
Patient temperature: 37
pCO2 arterial: 34 mmHg (ref 32–48)
pH, Arterial: 7.4 (ref 7.35–7.45)
pO2, Arterial: 87 mmHg (ref 83–108)

## 2022-04-12 LAB — MAGNESIUM: Magnesium: 2.4 mg/dL (ref 1.7–2.4)

## 2022-04-12 LAB — GLUCOSE, CAPILLARY
Glucose-Capillary: 145 mg/dL — ABNORMAL HIGH (ref 70–99)
Glucose-Capillary: 158 mg/dL — ABNORMAL HIGH (ref 70–99)
Glucose-Capillary: 196 mg/dL — ABNORMAL HIGH (ref 70–99)
Glucose-Capillary: 219 mg/dL — ABNORMAL HIGH (ref 70–99)
Glucose-Capillary: 259 mg/dL — ABNORMAL HIGH (ref 70–99)

## 2022-04-12 LAB — PHOSPHORUS: Phosphorus: 3.7 mg/dL (ref 2.5–4.6)

## 2022-04-12 MED ORDER — ZIPRASIDONE HCL 80 MG PO CAPS: 160.0000 mg | ORAL_CAPSULE | Freq: Every day | ORAL | Status: DC

## 2022-04-12 MED ORDER — ZIPRASIDONE HCL 80 MG PO CAPS
160.0000 mg | ORAL_CAPSULE | Freq: Every day | ORAL | Status: DC
  Administered 2022-04-12: 160 mg via ORAL
  Filled 2022-04-12: qty 2

## 2022-04-12 MED ORDER — ZIPRASIDONE HCL 80 MG PO CAPS
160.0000 mg | ORAL_CAPSULE | Freq: Every day | ORAL | Status: DC
  Administered 2022-04-12 – 2022-04-16 (×5): 160 mg via ORAL
  Filled 2022-04-12 (×6): qty 2

## 2022-04-12 MED ORDER — MIDAZOLAM HCL 2 MG/2ML IJ SOLN
4.0000 mg | Freq: Once | INTRAMUSCULAR | Status: AC
  Administered 2022-04-12: 4 mg via INTRAVENOUS

## 2022-04-12 MED ORDER — LORAZEPAM 2 MG/ML IJ SOLN
2.0000 mg | Freq: Once | INTRAMUSCULAR | Status: DC
  Filled 2022-04-12: qty 1

## 2022-04-12 MED ORDER — MIDAZOLAM HCL 2 MG/2ML IJ SOLN
INTRAMUSCULAR | Status: AC
  Filled 2022-04-12: qty 4

## 2022-04-12 MED ORDER — MIDAZOLAM HCL 2 MG/2ML IJ SOLN
4.0000 mg | INTRAMUSCULAR | Status: AC
Start: 2022-04-12 — End: 2022-04-12
  Administered 2022-04-12: 4 mg via INTRAVENOUS
  Filled 2022-04-12: qty 4

## 2022-04-12 NOTE — Progress Notes (Signed)
Progress Note   Patient: Riley Stuart:[id number] DOB: [birth year] DOA: 04/08/2022     4 DOS: the patient was seen and examined on 04/12/2022   Brief hospital course: Riley Stuart is a 71 year old male with a past medical history significant for ruptured cerebral aneurysm with subarachnoid hemorrhage in 2015, hypertension, diabetes mellitus, renal cell carcinoma on chemotherapy, and chronic kidney disease stage IV who presented to to [facility] ED on 04/08/2022 following out-of-hospital cardiac arrest.  Patient is currently intubated and sedated with no family available, therefore history is obtained from chart review.   Per ED and nursing notes, the patient resides at [name] where he was found unresponsive in a chair with left sided facial drooping by staff member.  Patient did have pulses at that time.  His brother actually talked to on the phone earlier this morning, and seemed to be in his usually state of health.  Fire Department reported he had thready pulses, of which then he lost his pulse.  EMS arrived at 09:50, initial rhythm was asystole, he received 4 rounds of epinephrine with ROSC after about 12 minutes.  EMS attempted to intubate with [facility] airway, but were unable to due to gagging.  Patient again lost his pulse, initially asystole and then V-fib for which he received 300 mg of amiodarone and 1 defibrillation.  Upon arrival to the ED, remained pulseless (asystole).  He required another several rounds of CPR and epinephrine.  He was successfully intubated by ED provider, who noted copious secretions.  ROSC briefly obtained, but then again lost pulse  Total estimated time of ACLS is approximately 50 minutes, and while in ED received a total of 4 mg of epinephrine, 1g calcium, and 1 amp bicarb.     Following ROSC, he was noted to be gagging on ETT, therefore low dose propofol started. EDP provider did note continued left facial droop and not responding to pain.  08/15  -patient's care was transferred to [facility] and overnight noted to have worsening respiratory distress.  Seen by PCCM and was placed on [place]. Seen during rounds and noted to be tachypneic.  Patient denied any new complaints.  Transferred back to ICU due to respiratory distress and concerns for reintubation.  8/17: weaning down oxygen. delirious     Assessment and Plan: Cardiac arrest [facility]) Patient presented after cardiac arrest with initial rhythm of asystole and subsequent rhythms notable for ventricular tachycardia. Received a total of 50 minutes of CPR. Intubated in the emergency room on 04/08/22 and was extubated on 04/09/22. Transferred back to ICU on 815 due to worsening respiratory distress and is currently on HFNC  - PT/OT consulted  Aspiration pneumonia of both lower lobes [facility]) Place patient on strict aspiration precautions. Cxr 8/15 with partial collapse rll. Cta 8/13 without signs PE Continue antibiotic therapy, zosyn. Antibiotics started 8/13, plan for 7 day course SLP consulted, advising dysphagia 2 diet CT of chest if respiratory status worsens Continue o2, weaing as able  NSTEMI (non-ST elevated myocardial infarction) ([place]) Noted to have elevated troponins in the setting of demand ischemia versus non-ST elevation MI. Twelve-lead EKG with chronic left bundle branch block. Patient was on a heparin drip for 48 hours with goal to transition to Eliquis. Not a candidate for left heart cath due to stage IV chronic kidney disease Patient had a 2D echocardiogram with normal LVEF of 60% Seen by cardiology, plans for ischemic work-up on hold since patient is a poor candidate for left heart cath  due to chronic kidney disease Continue aspirin and metoprolol  Schizophrenia Delirium Has underlying schizophrenia, here appears delirious 2/2 above processes. Group home says he is relatively cogent at baseline. Ct head nothing acute. Lives at a group home, they say he has  capacity at baseline to make his medical decisions - cont lamictal, geodon - tele-sitter - d/c steroids  Acute respiratory failure with hypoxia ([place]) Most likely secondary to pneumonia (community acquired versus aspiration) Initially intubated but has been extubated and is currently on high flow nasal cannula 50%/50L. Seen by PCCM for evaluation of respiratory distress and started on systemic steroids and bronchodilator therapy. 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 Hold Cabozantinib  AKI on CKD (chronic kidney disease) stage 4, GFR 15-29 ml/min ([name]) Patient with a history of stage IV chronic kidney disease now worsened and most likely related to contrast patient received for CT angio done on admission to rule out pulmonary embolism Gfr stable in the upper teens, slowly improving with good uop Nephrology following  Type 2 diabetes mellitus with diabetic chronic kidney disease ([name]) SSI  Thrombocytopenia Anemia Plts 130s, hgb 8.8, no report of bleeding - monitor for now  Hx PE Cont eliquis    Subjective: confused but alert, mild chest wall pain, asking for his phone  Physical Exam: Vitals:   04/12/22 0900 04/12/22 1110 04/12/22 1224 04/12/22 1300  BP: (!) 173/92  (!) 151/113 (!) 161/132  Pulse: 71  80   Resp: 20  (!) 22 (!) 21  Temp:   98.9 F (37.2 C)   TempSrc:   Oral   SpO2: 97% 95% 95%   Weight:      Height:       General: Elderly gentleman, chronically ill appearing, on high flow nasal cannula, very talkative and confused HEENT: [place]/AT, moist mucous membranes, sclera anicteric.   Neuro: Moving all extremities CV: rrr, s1s2, no murmurs PULM: Scattered rhonchi and  wheezes in all lung fields GI: soft, non-tender, non-distended, BS+ Extremities: warm, no edema   Data Reviewed: Relevant notes from primary care and specialist visits, past discharge summaries as available in EHR, including Care Everywhere. Prior diagnostic testing as pertinent to current admission diagnoses Updated medications and problem lists for reconciliation ED course, including vitals, labs, imaging, treatment and response to treatment Triage notes, nursing and pharmacy notes and ED provider's notes Notable results as noted in HPI  There are no new results to review at this time.  Family Communication: no answer when brother Riley Stuart called today.  Disposition: Status is: Inpatient Remains inpatient appropriate because: requiring high flow [place]  Planned Discharge Destination: To be determined   Author: Desma Maxim, MD 04/12/2022 4:38 PM  For on call review [email or web address].

## 2022-04-12 NOTE — TOC Progression Note (Signed)
Transition of Care [facility]) - Progression Note    Patient Details  Name: OAKLEE SUNGA MRN: [id number] Date of Birth: [birth year]  Transition of Care [name]) CM/SW Contact  Shelbie Hutching, RN Phone Number: 04/12/2022, 11:01 AM  Clinical Narrative:    Patient remains in the ICU stepdown status on HFNC.  TOC continues to follow.     Expected Discharge Plan: Group Home Barriers to Discharge: Continued Medical Work up  Expected Discharge Plan and Services Expected Discharge Plan: Group Home     Post Acute Care Choice: Resumption of Svcs/PTA Provider Living arrangements for the past 2 months: Group Home                                       Social Determinants of Health (SDOH) Interventions    Readmission Risk Interventions    04/10/2022   12:00 PM  Readmission Risk Prevention Plan  PCP or Specialist appointment within 3-5 days of discharge Complete  SW Recovery Care/Counseling Consult Complete  [place] [facility] Not Applicable

## 2022-04-12 NOTE — Progress Notes (Signed)
Chaplain provided supportive presence when Riley Stuart was agitated to the point of needing security presence. He was not easily redirected. Chaplain sang to him which appeared to aid him in falling asleep after team provided support.     04/12/22 1900  Clinical Encounter Type  Visited With Patient  Visit Type Follow-up

## 2022-04-12 NOTE — Progress Notes (Addendum)
Pt becoming increasingly agitated and requiring constant redirecting. Pt is requesting his cell phone and wallet which are at his care facility. Pt is refusing care and refusing to be cleaned after having bowel movements. Pt is also becoming combative. PRN versed and fentanyl given with no effect. Wouk, MD notified. No sitters available; telesitter initiated. Geodon ordered and given.  1650: called by central tele about pt EKG changes. QRS widening with a rate of 120s. EKG obtained; Wouk MD notified.

## 2022-04-12 NOTE — Progress Notes (Signed)
[facility]  ROUNDING NOTE   Subjective:   Riley Stuart is a 71 y.o. male with past medical history including hypertension, diabetes, renal cell carcinoma, with partial nephrectomy, and chronic kidney disease.  Patient presents to the emergency department from his group home with reports of a facial droop.  Patient has been admitted for Cardiac arrest [facility]) [I46.9] Lactic acidosis [E87.20] Aspiration pneumonia of both lower lobes, unspecified aspiration pneumonia type [facility]) [J69.0]  Patient known to our practice and is followed outpatient by Dr. Juleen China.  Patient was last seen in the office 01/24/2022 by Dr. Holley Raring for routine follow-up.    Patient sitting up in bed Tolerating meals without nausea and vomiting Remains on HFNC   Objective:  Vital signs in last 24 hours:  Temp:  [97.8 F (36.6 C)-99.9 F (37.7 C)] 99.5 F (37.5 C) (08/17 0800) Pulse Rate:  [62-107] 71 (08/17 0900) Resp:  [13-24] 20 (08/17 0900) BP: (111-173)/(74-144) 173/92 (08/17 0900) SpO2:  [80 %-100 %] 97 % (08/17 0900) FiO2 (%):  [50 %-55 %] 50 % (08/17 0814) Weight:  [100.1 kg] 100.1 kg (08/17 0500)  Weight change: -1 kg Filed Weights   04/10/22 0601 04/11/22 0410 04/12/22 0500  Weight: 101.8 kg 101.1 kg 100.1 kg    Intake/Output: I/O last 3 completed shifts: In: 1564.3 [I.V.:1319.9; IV Piggyback:244.4] Out: 2500 [Urine:2500]   Intake/Output this shift:  Total I/O In: 105 [I.V.:80.1; IV Piggyback:25] Out: -   Physical Exam: General: NAD, restless  Head: Normocephalic, atraumatic. Moist oral mucosal membranes  Eyes: Anicteric  Lungs:  Diminished in bases, expiratory wheeze, HFNC  Heart: Regular rate and rhythm  Abdomen:  Soft, nontender, nondistended  Extremities: No peripheral edema.  Neurologic: Nonfocal, moving all four extremities  Skin: No lesions  Access: None    Basic Metabolic Panel: Recent Labs  Lab 04/08/22 1108 04/09/22 0213 04/10/22 0506  04/11/22 0353 04/12/22 0532  NA 143 143 138 141 140  K 3.8 3.8 3.8 4.1 4.1  CL 112* 113* 108 113* 112*  CO2 21* 19* 19* 21* 22  GLUCOSE 145* 94 187* 118* 134*  BUN 27* 37* 42* 51* 51*  CREATININE 3.94* 3.74* 4.09* 3.81* 3.48*  CALCIUM 9.3 8.5* 8.6* 8.3* 8.3*  MG 1.7 1.8 2.1 2.2 2.4  PHOS 5.8* 5.2* 4.1 4.0 3.7     Liver Function Tests: Recent Labs  Lab 04/08/22 1108 04/09/22 0213  AST 60* 137*  ALT 37 67*  ALKPHOS 55 36*  BILITOT 0.8 0.7  PROT 6.1* 5.6*  ALBUMIN 3.0* 2.8*    No results for input(s): "LIPASE", "AMYLASE" in the last 168 hours. No results for input(s): "AMMONIA" in the last 168 hours.  CBC: Recent Labs  Lab 04/08/22 1108 04/09/22 0213 04/10/22 0506 04/11/22 0353 04/12/22 0532  WBC 10.3 12.1* 12.4* 11.3* 10.8*  NEUTROABS 5.6  --   --   --   --   HGB 11.6* 11.3* 10.4* 8.8* 8.6*  HCT 35.0* 32.1* 29.7* 24.5* 24.1*  MCV 99.7 93.6 93.1 91.8 91.3  PLT 219 206 141* 122* 137*     Cardiac Enzymes: No results for input(s): "CKTOTAL", "CKMB", "CKMBINDEX", "TROPONINI" in the last 168 hours.  BNP: Invalid input(s): "POCBNP"  CBG: Recent Labs  Lab 04/11/22 1645 04/11/22 1934 04/11/22 2133 04/12/22 0737 04/12/22 1113  GLUCAP 184* 223* 197* 145* 158*     Microbiology: Results for orders placed or performed during the hospital encounter of 04/08/22  Urine Culture     Status: None  Collection Time: 04/08/22  1:37 PM   Specimen: Urine, Random  Result Value Ref Range Status   Specimen Description   Final    URINE, RANDOM Performed at [facility], [address] [place], [place] [address]    Special Requests   Final    NONE Performed at [facility], [address], [place], [place] [address]    Culture   Final    NO GROWTH Performed at [facility], [place] [address], [place], [place] [address]    Report Status 04/09/2022 FINAL  Final  MRSA Next Gen by PCR, Nasal     Status: None   Collection Time: 04/08/22  3:00  PM   Specimen: Nasal Mucosa; Nasal Swab  Result Value Ref Range Status   MRSA by PCR Next Gen NOT DETECTED NOT DETECTED Final    Comment: (NOTE) The GeneXpert MRSA Assay (FDA approved for NASAL specimens only), is one component of a comprehensive MRSA colonization surveillance program. It is not intended to diagnose MRSA infection nor to guide or monitor treatment for MRSA infections. Test performance is not FDA approved in patients less than 20 years old. Performed at [facility], [address] [place], [place] [address]   Culture, blood (Routine X 2) w Reflex to ID Panel     Status: None (Preliminary result)   Collection Time: 04/08/22  6:01 PM   Specimen: BLOOD  Result Value Ref Range Status   Specimen Description BLOOD BLOOD LEFT FOREARM  Final   Special Requests IN PEDIATRIC BOTTLE Blood Culture adequate volume  Final   Culture   Final    NO GROWTH 4 DAYS Performed at [facility], [address], [place], [place] [address]    Report Status PENDING  Incomplete  Culture, blood (Routine X 2) w Reflex to ID Panel     Status: None (Preliminary result)   Collection Time: 04/08/22  6:01 PM   Specimen: BLOOD  Result Value Ref Range Status   Specimen Description BLOOD BLOOD LEFT HAND  Final   Special Requests   Final    BOTTLES DRAWN AEROBIC AND ANAEROBIC Blood Culture adequate volume   Culture   Final    NO GROWTH 4 DAYS Performed at [facility], [address], [place], [place] [address]    Report Status PENDING  Incomplete    Coagulation Studies: No results for input(s): "LABPROT", "INR" in the last 72 hours.   Urinalysis: No results for input(s): "COLORURINE", "LABSPEC", "PHURINE", "GLUCOSEU", "HGBUR", "BILIRUBINUR", "KETONESUR", "PROTEINUR", "UROBILINOGEN", "NITRITE", "LEUKOCYTESUR" in the last 72 hours.  Invalid input(s): "APPERANCEUR"     Imaging: US RENAL  Result Date: 04/10/2022 CLINICAL DATA:  Acute renal failure. History of  partial left nephrectomy for renal carcinoma. EXAM: RENAL / URINARY TRACT ULTRASOUND COMPLETE COMPARISON:  11/24/2018 FINDINGS: Right Kidney: Renal measurements: 12.6 x 5.4 x 6.2 cm = volume: 219 mL. Increased cortical echogenicity. No hydronephrosis. Simple cysts identified with the largest measuring just under 4 cm. Left Kidney: Renal measurements: 9.8 x 7.3 x 7.8 cm = volume: 291 mL. Increased cortical echogenicity. No hydronephrosis. Multiple simple cysts identified with the largest measuring approximately 3.4 cm. Bladder: Appears normal for degree of bladder distention. Other: None. IMPRESSION: Multiple renal cysts bilaterally. Both kidneys demonstrate increased cortical echogenicity without evidence of hydronephrosis. Electronically Signed   By: Aletta Edouard M.D.   On: 04/10/2022 17:53     Medications:    sodium chloride Stopped (04/08/22 1515)  sodium chloride 40 mL/hr at 04/12/22 0900   piperacillin-tazobactam (ZOSYN)  IV 12.5 mL/hr at 04/12/22 0900    amLODipine  10 mg Oral Daily   apixaban  2.5 mg Oral BID   arformoterol  15 mcg Nebulization BID   aspirin EC  81 mg Oral Daily   atorvastatin  10 mg Oral Daily   budesonide (PULMICORT) nebulizer solution  0.5 mg Nebulization BID   Chlorhexidine Gluconate Cloth  6 each Topical Q0600   insulin aspart  0-9 Units Subcutaneous TID WC   lamoTRIgine  100 mg Oral BID   methylPREDNISolone (SOLU-MEDROL) injection  120 mg Intravenous Q24H   metoprolol tartrate  25 mg Oral BID   mouth rinse  15 mL Mouth Rinse QID   pantoprazole  40 mg Oral Daily   revefenacin  175 mcg Nebulization Daily   tamsulosin  0.4 mg Oral Daily   acetaminophen, docusate sodium, fentaNYL (SUBLIMAZE) injection, ipratropium-albuterol, midazolam, mouth rinse, polyethylene glycol  Assessment/ Plan:  Riley Stuart is a 71 y.o.  male with past medical history including hypertension, diabetes, renal cell carcinoma, with partial nephrectomy, and chronic kidney  disease.  Patient presents to the emergency department from his group home with reports of a facial droop.  Patient has been admitted for Cardiac arrest [facility] [I46.9] Lactic acidosis [E87.20] Aspiration pneumonia of both lower lobes, unspecified aspiration pneumonia type ([place]) [J69.0]   Acute Kidney Injury on chronic kidney disease stage IV with baseline creatinine 3.4 and GFR of 18 on 01/17/22.  Acute kidney injury secondary to IV contrast nephropathy.  IV contrast exposure on 04/08/2022.  Kidney injury also likely due to lack of perfusion from cardiac arrest. Chronic kidney disease is secondary to renal cell carcinoma with partial left nephrectomy.  Olmesartan and torsemide held.  Avoid nephrotoxic agents and therapies.    Renal function continues to improve. UOP 1.8L. Will continue to monitor renal function.  Continue to avoid nephrotoxic agents and therapies.   Lab Results  Component Value Date   CREATININE 3.48 (H) 04/12/2022   CREATININE 3.81 (H) 04/11/2022   CREATININE 4.09 (H) 04/10/2022    Intake/Output Summary (Last 24 hours) at 04/12/2022 1134 Last data filed at 04/12/2022 0900 Gross per 24 hour  Intake 898.21 ml  Output 1800 ml  Net -901.79 ml    2.  Hypertension with chronic kidney disease.  Home regimen includes amlodipine-valsartan, carvedilol, olmesartan, and torsemide.  Currently only receiving amlodipine and metoprolol.  Blood pressure 173/92.  3. Diabetes mellitus type II with chronic kidney disease/renal manifestations: insulin dependent. Home regimen includes Humalog 75/25 and NovoLog 30/70. Most recent hemoglobin A1c is 5.7 on 04/09/22.   Primary team to manage sliding scale insulin.  4.  Acute metabolic acidosis.  Serum bicarb on admission 21.  Corrected  5.  Aspiration pneumonia.  Currently on high flow nasal cannula and receiving antibiotics.   LOS: 4   8/17/202311:34 AM

## 2022-04-12 NOTE — Progress Notes (Signed)
Progress Note  Patient Name: Riley Stuart Date of Encounter: 04/12/2022  [facility] Cardiologist: Nelva Bush, MD   Subjective   Talkative this morning, on high flow oxygen Mild confusion Reports no other complaints  Inpatient Medications    Scheduled Meds:  amLODipine  10 mg Oral Daily   apixaban  2.5 mg Oral BID   arformoterol  15 mcg Nebulization BID   aspirin EC  81 mg Oral Daily   atorvastatin  10 mg Oral Daily   budesonide (PULMICORT) nebulizer solution  0.5 mg Nebulization BID   Chlorhexidine Gluconate Cloth  6 each Topical Q0600   insulin aspart  0-9 Units Subcutaneous TID WC   lamoTRIgine  100 mg Oral BID   methylPREDNISolone (SOLU-MEDROL) injection  120 mg Intravenous Q24H   metoprolol tartrate  25 mg Oral BID   mouth rinse  15 mL Mouth Rinse QID   pantoprazole  40 mg Oral Daily   revefenacin  175 mcg Nebulization Daily   tamsulosin  0.4 mg Oral Daily   Continuous Infusions:  sodium chloride Stopped (04/08/22 1515)   sodium chloride 40 mL/hr at 04/12/22 0900   piperacillin-tazobactam (ZOSYN)  IV 12.5 mL/hr at 04/12/22 0900   PRN Meds: acetaminophen, docusate sodium, fentaNYL (SUBLIMAZE) injection, ipratropium-albuterol, midazolam, mouth rinse, polyethylene glycol   Vital Signs    Vitals:   04/12/22 0500 04/12/22 0800 04/12/22 0814 04/12/22 0900  BP: 111/89 (!) 157/142  (!) 173/92  Pulse: 91 73 92 71  Resp: (!) '21 20 20 20  '$ Temp:  99.5 F (37.5 C)    TempSrc:  Oral    SpO2: (!) 80% 90% 94% 97%  Weight: 100.1 kg     Height:        Intake/Output Summary (Last 24 hours) at 04/12/2022 1029 Last data filed at 04/12/2022 0900 Gross per 24 hour  Intake 938.21 ml  Output 1800 ml  Net -861.79 ml      04/12/2022    5:00 AM 04/11/2022    4:10 AM 04/10/2022    6:01 AM  Last 3 Weights  Weight (lbs) 220 lb 10.9 oz 222 lb 14.2 oz 224 lb 6.9 oz  Weight (kg) 100.1 kg 101.1 kg 101.8 kg      Telemetry    Normal sinus rhythm- Personally  Reviewed  ECG     - Personally Reviewed  Physical Exam   Constitutional: Confused, talkative mild respiratory distress HENT:  Head: Grossly normal Eyes:  no discharge. No scleral icterus.  Neck: No JVD, no carotid bruits  Cardiovascular: Regular rate and rhythm, no murmurs appreciated Pulmonary/Chest: Mildly decreased breath sounds throughout, scattered Rales Abdominal: Soft.  no distension.  no tenderness.  Musculoskeletal: Normal range of motion Neurological:  normal muscle tone. Coordination normal. No atrophy Skin: Skin warm and dry Psychiatric: Talkative, tangential pleasant   Labs    High Sensitivity Troponin:   Recent Labs  Lab 04/08/22 1108 04/08/22 1325 04/08/22 1601 04/08/22 1801 04/09/22 0213  TROPONINIHS 95* 535* 913* 1,131* 716*     Chemistry Recent Labs  Lab 04/08/22 1108 04/09/22 0213 04/10/22 0506 04/11/22 0353 04/12/22 0532  NA 143 143 138 141 140  K 3.8 3.8 3.8 4.1 4.1  CL 112* 113* 108 113* 112*  CO2 21* 19* 19* 21* 22  GLUCOSE 145* 94 187* 118* 134*  BUN 27* 37* 42* 51* 51*  CREATININE 3.94* 3.74* 4.09* 3.81* 3.48*  CALCIUM 9.3 8.5* 8.6* 8.3* 8.3*  MG 1.7 1.8 2.1 2.2 2.4  PROT 6.1* 5.6*  --   --   --   ALBUMIN 3.0* 2.8*  --   --   --   AST 60* 137*  --   --   --   ALT 37 67*  --   --   --   ALKPHOS 55 36*  --   --   --   BILITOT 0.8 0.7  --   --   --   GFRNONAA 15* 16* 15* 16* 18*  ANIONGAP '10 11 11 7 6    '$ Lipids  Recent Labs  Lab 04/09/22 0213  CHOL 86  TRIG 114  110  HDL 26*  LDLCALC 37  CHOLHDL 3.3    Hematology Recent Labs  Lab 04/10/22 0506 04/11/22 0353 04/12/22 0532  WBC 12.4* 11.3* 10.8*  RBC 3.19* 2.67* 2.64*  HGB 10.4* 8.8* 8.6*  HCT 29.7* 24.5* 24.1*  MCV 93.1 91.8 91.3  MCH 32.6 33.0 32.6  MCHC 35.0 35.9 35.7  RDW 16.7* 16.5* 16.8*  PLT 141* 122* 137*   Thyroid  Recent Labs  Lab 04/09/22 0213  TSH 0.794    BNP Recent Labs  Lab 04/08/22 1325  BNP 125.4*    DDimer No results for input(s):  "DDIMER" in the last 168 hours.   Radiology    US RENAL  Result Date: 04/10/2022 CLINICAL DATA:  Acute renal failure. History of partial left nephrectomy for renal carcinoma. EXAM: RENAL / URINARY TRACT ULTRASOUND COMPLETE COMPARISON:  11/24/2018 FINDINGS: Right Kidney: Renal measurements: 12.6 x 5.4 x 6.2 cm = volume: 219 mL. Increased cortical echogenicity. No hydronephrosis. Simple cysts identified with the largest measuring just under 4 cm. Left Kidney: Renal measurements: 9.8 x 7.3 x 7.8 cm = volume: 291 mL. Increased cortical echogenicity. No hydronephrosis. Multiple simple cysts identified with the largest measuring approximately 3.4 cm. Bladder: Appears normal for degree of bladder distention. Other: None. IMPRESSION: Multiple renal cysts bilaterally. Both kidneys demonstrate increased cortical echogenicity without evidence of hydronephrosis. Electronically Signed   By: Aletta Edouard M.D.   On: 04/10/2022 17:53    Cardiac Studies   Echo   1. Left ventricular ejection fraction, by estimation, is 60 to 65%. The  left ventricle has normal function. Left ventricular endocardial border  not optimally defined to evaluate regional wall motion. There is severe  left ventricular hypertrophy. Left  ventricular diastolic parameters are indeterminate.   2. Right ventricular systolic function is normal. The right ventricular  size is normal.   3. Left atrial size was mildly dilated.   4. Right atrial size was mildly dilated.   5. The mitral valve is normal in structure. Trivial mitral valve  regurgitation.   6. The aortic valve has an indeterminant number of cusps. There is mild  calcification of the aortic valve. There is mild thickening of the aortic  valve. Aortic valve regurgitation is not visualized. Aortic valve  sclerosis is present, with no evidence of   aortic valve stenosis.   7. Aortic dilatation noted. There is mild dilatation of the ascending  aorta, measuring 38 mm.   8. The  inferior vena cava is normal in size with <50% respiratory  variability, suggesting right atrial pressure of 8 mmHg.   Patient Profile  Riley Stuart is a 71 y.o. male with a hx of chronic kidney disease stage IV, diabetes, hypertension, renal cell carcinoma on oral chemotherapy, schizoaffective disorder, h/o of PE, ruptured cerebral aneurysm with subarachnoid hemorrhage who is being seen 04/09/2022 for the  evaluation of out-of-hospital cardiac arrest  Assessment & Plan    Out of hospital cardiac arrest with unknown etiology; Elevated troponins in the setting of demand ischemia vs NSTEMI - ACLS time was approximately 50 minutes to obtain ROSC - Hs troponins trended 95, 535, 913, 1131, and 716 -Completed heparin infusion 48 hours - EKG with chronic LBBB - Echocardiogram with normal LV function EF 60% - currently off of pressors poor candidate for cardiac catheterization in the setting of delirium, renal failure, likely unable to lay flat for Myoview in setting of respiratory distress and mentation.    Acute hypoxic/ hypercapnic respiratory failure/ Aspiration vs CAP  - extubated several days ago  Respiratory distress starting 2 days ago Remains on high flow nasal cannula oxygen, being monitored closely in the ICU On broad-spectrum antibiotics,  steroids   AKI on CKD IV - serum creatinine 3.4, likely ATN Case discussed with nephrology, improving with fluids   Diabetes - A1c 5.7 - continue insulin  5. Acute metabolic encephalopathy with history of schizoaffective disoprder Continue delirium, baseline unclear Unclear if some times exacerbated by anoxic injury from cardiac arrest   6. H/o pulmonary embolism Has been transitioned from heparin infusion back to eliquis 2.5 mg bid  - CT of the chest negative for PE      Total encounter time more than 40 minutes  Greater than 50% was spent in counseling and coordination of care with the patient   For questions or updates,  please contact [facility] Please consult [email or web address] for contact info under        Signed, Ida Rogue, MD  04/12/2022, 10:29 AM

## 2022-04-13 DIAGNOSIS — I469 Cardiac arrest, cause unspecified: Secondary | ICD-10-CM | POA: Diagnosis not present

## 2022-04-13 LAB — GLUCOSE, CAPILLARY
Glucose-Capillary: 162 mg/dL — ABNORMAL HIGH (ref 70–99)
Glucose-Capillary: 242 mg/dL — ABNORMAL HIGH (ref 70–99)
Glucose-Capillary: 127 mg/dL — ABNORMAL HIGH (ref 70–99)
Glucose-Capillary: 137 mg/dL — ABNORMAL HIGH (ref 70–99)

## 2022-04-13 LAB — CULTURE, BLOOD (ROUTINE X 2)
Culture: NO GROWTH
Culture: NO GROWTH
Special Requests: ADEQUATE
Special Requests: ADEQUATE

## 2022-04-13 LAB — BASIC METABOLIC PANEL
Anion gap: 9 (ref 5–15)
BUN: 53 mg/dL — ABNORMAL HIGH (ref 8–23)
CO2: 18 mmol/L — ABNORMAL LOW (ref 22–32)
Calcium: 8.7 mg/dL — ABNORMAL LOW (ref 8.9–10.3)
Chloride: 116 mmol/L — ABNORMAL HIGH (ref 98–111)
Creatinine, Ser: 3.32 mg/dL — ABNORMAL HIGH (ref 0.61–1.24)
GFR, Estimated: 19 mL/min — ABNORMAL LOW (ref >60)
Glucose, Bld: 143 mg/dL — ABNORMAL HIGH (ref 70–99)
Potassium: 4.4 mmol/L (ref 3.5–5.1)
Sodium: 143 mmol/L (ref 135–145)

## 2022-04-13 LAB — CBC
HCT: 28.1 % — ABNORMAL LOW (ref 39.0–52.0)
Hemoglobin: 9.6 g/dL — ABNORMAL LOW (ref 13.0–17.0)
MCH: 33 pg (ref 26.0–34.0)
MCHC: 34.2 g/dL (ref 30.0–36.0)
MCV: 96.6 fL (ref 80.0–100.0)
Platelets: 166 10*3/uL (ref 150–400)
RBC: 2.91 MIL/uL — ABNORMAL LOW (ref 4.22–5.81)
RDW: 18.4 % — ABNORMAL HIGH (ref 11.5–15.5)
WBC: 11.3 10*3/uL — ABNORMAL HIGH (ref 4.0–10.5)
nRBC: 5.2 % — ABNORMAL HIGH (ref 0.0–0.2)

## 2022-04-13 LAB — PHOSPHORUS: Phosphorus: 3.5 mg/dL (ref 2.5–4.6)

## 2022-04-13 LAB — MAGNESIUM: Magnesium: 2.7 mg/dL — ABNORMAL HIGH (ref 1.7–2.4)

## 2022-04-13 MED ORDER — MIDAZOLAM HCL 2 MG/2ML IJ SOLN: 4.0000 mg | Freq: Once | INTRAMUSCULAR | Status: DC

## 2022-04-13 MED ORDER — FENTANYL CITRATE (PF) 100 MCG/2ML IJ SOLN
100.0000 ug | Freq: Once | INTRAMUSCULAR | Status: AC
  Administered 2022-04-13: 100 ug via INTRAVENOUS
  Filled 2022-04-13: qty 2

## 2022-04-13 MED ORDER — ORAL CARE MOUTH RINSE
15.0000 mL | OROMUCOSAL | Status: DC
  Administered 2022-04-13 – 2022-04-17 (×14): 15 mL via OROMUCOSAL

## 2022-04-13 MED ORDER — MIDAZOLAM HCL 2 MG/2ML IJ SOLN: 4.0000 mg | INTRAMUSCULAR | Status: AC

## 2022-04-13 MED ORDER — MIDAZOLAM HCL 2 MG/2ML IJ SOLN
INTRAMUSCULAR | Status: AC
  Administered 2022-04-13: 4 mg via INTRAVENOUS
  Filled 2022-04-13: qty 4

## 2022-04-13 MED ORDER — AMOXICILLIN-POT CLAVULANATE 500-125 MG PO TABS
1.0000 | ORAL_TABLET | Freq: Two times a day (BID) | ORAL | Status: AC
  Administered 2022-04-13 – 2022-04-14 (×4): 500 mg via ORAL
  Filled 2022-04-13 (×5): qty 1

## 2022-04-13 MED ORDER — ORAL CARE MOUTH RINSE: 15.0000 mL | OROMUCOSAL | Status: DC | PRN

## 2022-04-13 MED ORDER — CARVEDILOL 25 MG PO TABS
25.0000 mg | ORAL_TABLET | Freq: Two times a day (BID) | ORAL | Status: DC
Start: 2022-04-13 — End: 2022-04-17
  Administered 2022-04-13 – 2022-04-16 (×7): 25 mg via ORAL
  Filled 2022-04-13 (×2): qty 1
  Filled 2022-04-13 (×3): qty 2
  Filled 2022-04-13 (×2): qty 1

## 2022-04-13 NOTE — Progress Notes (Signed)
[facility]  ROUNDING NOTE   Subjective:   Riley Stuart is a 71 y.o. male with past medical history including hypertension, diabetes, renal cell carcinoma, with partial nephrectomy, and chronic kidney disease.  Patient presents to the emergency department from his group home with reports of a facial droop.  Patient has been admitted for Cardiac arrest [facility]) [I46.9] Lactic acidosis [E87.20] Aspiration pneumonia of both lower lobes, unspecified aspiration pneumonia type [facility]) [J69.0]  Patient known to our practice and is followed outpatient by Dr. Juleen China.  Patient was last seen in the office 01/24/2022 by Dr. Holley Raring for routine follow-up.    -Patient resting comfortably in bed.  Has been transitioned off of high flow nasal cannula and on regular nasal cannula at the moment.  Currently awake and alert and conversant. Appears to be in good spirits. Good urine output of 1.8 L over the preceding 24 hours. Creatinine down to 3.3.  Objective:  Vital signs in last 24 hours:  Temp:  [97.6 F (36.4 C)-99 F (37.2 C)] 97.6 F (36.4 C) (08/18 0730) Pulse Rate:  [62-122] 89 (08/18 0900) Resp:  [13-27] 17 (08/18 0900) BP: (130-197)/(79-132) 176/111 (08/18 0900) SpO2:  [88 %-100 %] 89 % (08/18 0900) FiO2 (%):  [50 %] 50 % (08/17 2000) Weight:  [100 kg] 100 kg (08/18 0500)  Weight change: -0.1 kg Filed Weights   04/11/22 0410 04/12/22 0500 04/13/22 0500  Weight: 101.1 kg 100.1 kg 100 kg    Intake/Output: I/O last 3 completed shifts: In: 880.6 [I.V.:719.1; IV Piggyback:161.5] Out: 3250 [Urine:3250]   Intake/Output this shift:  Total I/O In: 1320 [P.O.:1320] Out: -   Physical Exam: General: NAD  Head: Normocephalic, atraumatic. Moist oral mucosal membranes  Eyes: Anicteric  Lungs:  Transitioned off HFNC on [place], nonlabored  Heart: Regular rate on monitor  Abdomen:  Nondistedned  Extremities: No peripheral edema.  Neurologic: Nonfocal, moving all four extremities   Skin: No lesions  Access: None    Basic Metabolic Panel: Recent Labs  Lab 04/09/22 0213 04/10/22 0506 04/11/22 0353 04/12/22 0532 04/13/22 0904  NA 143 138 141 140 143  K 3.8 3.8 4.1 4.1 4.4  CL 113* 108 113* 112* 116*  CO2 19* 19* 21* 22 18*  GLUCOSE 94 187* 118* 134* 143*  BUN 37* 42* 51* 51* 53*  CREATININE 3.74* 4.09* 3.81* 3.48* 3.32*  CALCIUM 8.5* 8.6* 8.3* 8.3* 8.7*  MG 1.8 2.1 2.2 2.4 2.7*  PHOS 5.2* 4.1 4.0 3.7 3.5     Liver Function Tests: Recent Labs  Lab 04/08/22 1108 04/09/22 0213  AST 60* 137*  ALT 37 67*  ALKPHOS 55 36*  BILITOT 0.8 0.7  PROT 6.1* 5.6*  ALBUMIN 3.0* 2.8*    No results for input(s): "LIPASE", "AMYLASE" in the last 168 hours. No results for input(s): "AMMONIA" in the last 168 hours.  CBC: Recent Labs  Lab 04/08/22 1108 04/09/22 0213 04/10/22 0506 04/11/22 0353 04/12/22 0532 04/13/22 0904  WBC 10.3 12.1* 12.4* 11.3* 10.8* 11.3*  NEUTROABS 5.6  --   --   --   --   --   HGB 11.6* 11.3* 10.4* 8.8* 8.6* 9.6*  HCT 35.0* 32.1* 29.7* 24.5* 24.1* 28.1*  MCV 99.7 93.6 93.1 91.8 91.3 96.6  PLT 219 206 141* 122* 137* 166     Cardiac Enzymes: No results for input(s): "CKTOTAL", "CKMB", "CKMBINDEX", "TROPONINI" in the last 168 hours.  BNP: Invalid input(s): "POCBNP"  CBG: Recent Labs  Lab 04/12/22 1602 04/12/22  1716 04/12/22 2152 04/13/22 0744 04/13/22 1117  GLUCAP 196* 219* 259* 162* 33*     Microbiology: Results for orders placed or performed during the hospital encounter of 04/08/22  Urine Culture     Status: None   Collection Time: 04/08/22  1:37 PM   Specimen: Urine, Random  Result Value Ref Range Status   Specimen Description   Final    URINE, RANDOM Performed at [facility], [address], [place], [place] [address]    Special Requests   Final    NONE Performed at [facility], [address] [place], [place] [address]    Culture   Final    NO GROWTH Performed at [facility], [place] [address] [place], [place] [address]    Report Status 04/09/2022 FINAL  Final  MRSA Next Gen by PCR, Nasal     Status: None   Collection Time: 04/08/22  3:00 PM   Specimen: Nasal Mucosa; Nasal Swab  Result Value Ref Range Status   MRSA by PCR Next Gen NOT DETECTED NOT DETECTED Final    Comment: (NOTE) The GeneXpert MRSA Assay (FDA approved for NASAL specimens only), is one component of a comprehensive MRSA colonization surveillance program. It is not intended to diagnose MRSA infection nor to guide or monitor treatment for MRSA infections. Test performance is not FDA approved in patients less than 58 years old. Performed at [name], [address] [place], [place] [address]   Culture, blood (Routine X 2) w Reflex to ID Panel     Status: None   Collection Time: 04/08/22  6:01 PM   Specimen: BLOOD  Result Value Ref Range Status   Specimen Description BLOOD BLOOD LEFT FOREARM  Final   Special Requests IN PEDIATRIC BOTTLE Blood Culture adequate volume  Final   Culture   Final    NO GROWTH 5 DAYS Performed at [facility], [address], [place], [place] [address]    Report Status 04/13/2022 FINAL  Final  Culture, blood (Routine X 2) w Reflex to ID Panel     Status: None   Collection Time: 04/08/22  6:01 PM   Specimen: BLOOD  Result Value Ref Range Status   Specimen Description BLOOD BLOOD LEFT HAND  Final   Special Requests   Final    BOTTLES DRAWN AEROBIC AND ANAEROBIC Blood Culture adequate volume   Culture   Final    NO GROWTH 5 DAYS Performed at [facility], [address] [place],  [address]    Report Status 04/13/2022 FINAL  Final    Coagulation Studies: No results for input(s): "LABPROT", "INR" in the last 72 hours.   Urinalysis: No results for input(s): "COLORURINE", "LABSPEC", "PHURINE", "GLUCOSEU", "HGBUR", "BILIRUBINUR", "KETONESUR", "PROTEINUR", "UROBILINOGEN", "NITRITE", "LEUKOCYTESUR" in the last 72  hours.  Invalid input(s): "APPERANCEUR"     Imaging: No results found.   Medications:    sodium chloride Stopped (04/08/22 1515)    amLODipine  10 mg Oral Daily   apixaban  2.5 mg Oral BID   arformoterol  15 mcg Nebulization BID   aspirin EC  81 mg Oral Daily   atorvastatin  10 mg Oral Daily   budesonide (PULMICORT) nebulizer solution  0.5 mg Nebulization BID   Chlorhexidine Gluconate Cloth  6 each Topical Q0600   insulin aspart  0-9 Units Subcutaneous TID WC   lamoTRIgine  100 mg Oral BID   metoprolol tartrate  25 mg Oral BID  mouth rinse  15 mL Mouth Rinse 4 times per day   pantoprazole  40 mg Oral Daily   revefenacin  175 mcg Nebulization Daily   tamsulosin  0.4 mg Oral Daily   ziprasidone  160 mg Oral QHS   acetaminophen, docusate sodium, fentaNYL (SUBLIMAZE) injection, ipratropium-albuterol, midazolam, mouth rinse, polyethylene glycol  Assessment/ Plan:  [name] is a 71 y.o.  male with past medical history including hypertension, diabetes, renal cell carcinoma, with partial nephrectomy, and chronic kidney disease.  Patient presents to the emergency department from his group home with reports of a facial droop.  Patient has been admitted for Cardiac arrest [facility]) [I46.9] Lactic acidosis [E87.20] Aspiration pneumonia of both lower lobes, unspecified aspiration pneumonia type ([place]) [J69.0]   Acute Kidney Injury on chronic kidney disease stage IV with baseline creatinine 3.4 and GFR of 18 on 01/17/22.  Acute kidney injury secondary to IV contrast nephropathy.  IV contrast exposure on 04/08/2022.  Kidney injury also likely due to lack of perfusion from cardiac arrest. Chronic kidney disease is secondary to renal cell carcinoma with partial left nephrectomy.  Olmesartan and torsemide held.  Avoid nephrotoxic agents and therapies.    Renal function continues to improve slowly.  Creatinine down to 3.3.  Good urine output of 1.8 L.  No indication for  dialysis.   Lab Results  Component Value Date   CREATININE 3.32 (H) 04/13/2022   CREATININE 3.48 (H) 04/12/2022   CREATININE 3.81 (H) 04/11/2022    Intake/Output Summary (Last 24 hours) at 04/13/2022 1140 Last data filed at 04/13/2022 1125 Gross per 24 hour  Intake 1672.45 ml  Output 2450 ml  Net -777.55 ml    2.  Hypertension with chronic kidney disease.  Home regimen includes amlodipine-valsartan, carvedilol, olmesartan, and torsemide.  Continue metoprolol and amlodipine.  Valsartan on hold.  3. Diabetes mellitus type II with chronic kidney disease/renal manifestations: insulin dependent. Home regimen includes Humalog 75/25 and NovoLog 30/70. Most recent hemoglobin A1c is 5.7 on 04/09/22.   Primary team to manage sliding scale insulin.  4.  Acute metabolic acidosis.  Serum bicarbonate a bit lower today at 18.  Continue to monitor for now.  5.  Aspiration pneumonia.  Transition off of high flow nasal cannula and receiving regular nasal cannula.   LOS: 5 Riley Stuart 8/18/202311:40 AM

## 2022-04-13 NOTE — Evaluation (Signed)
Occupational Therapy Evaluation Patient Details Name: Riley Stuart MRN: [id number] DOB: 03-15-1951 Today's Date: 04/13/2022   History of Present Illness 71 y.o. male with a hx of chronic kidney disease stage IV, diabetes, hypertension, renal cell carcinoma on oral chemotherapy, schizoaffective disorder, h/o of PE, ruptured cerebral aneurysm with subarachnoid hemorrhage who is being seen 04/09/2022 for the evaluation of out-of-hospital cardiac arrest. Received 55mn of CPR, intubated, now extubated at time of therapy evaluations.   Clinical Impression   Pt was seen for OT evaluation and co-tx with PT this date. Prior to hospital admission, pt was living at a group home and reports no AD use for mobility and able to complete basic ADL without direct assist from staff. Pt alert, pleasant, and oriented to hospital and self, demonstrating decreased awareness of deficits, safety, and STM requiring cues during session for sequencing and safety. Pt presents to acute OT demonstrating impaired ADL performance and functional mobility 2/2 decreased strength, balance, safety, and activity tolerance (See OT problem list). Pt on 10L HFNC throughout, SpO2 using pulse ox on finger reading >96% throughout session, HR 80's through low 110's with exertion. Pt requires MIN A for LB ADL, MIN A for ADL transfers (+2 physical assist for initial, +2 for safety as session progressed). Pt had 1 slight LOB requiring some assist from PT to correct while attempting to take a step backwards with RW. Pt would benefit from skilled OT services to address noted impairments and functional limitations (see below for any additional details) in order to maximize safety and independence while minimizing falls risk and caregiver burden. Upon hospital discharge, recommend STR to maximize pt safety and return to PLOF.        Recommendations for follow up therapy are one component of a multi-disciplinary discharge planning process, led by  the attending physician.  Recommendations may be updated based on patient status, additional functional criteria and insurance authorization.   Follow Up Recommendations  Skilled nursing-short term rehab (<3 hours/day)    Assistance Recommended at Discharge Frequent or constant Supervision/Assistance  Patient can return home with the following A little help with walking and/or transfers;A little help with bathing/dressing/bathroom;Assistance with cooking/housework;Assist for transportation;Direct supervision/assist for medications management;Help with stairs or ramp for entrance    Functional Status Assessment     Equipment Recommendations  BSC/3in1;Other (comment) (2WW)    Recommendations for Other Services       Precautions / Restrictions Precautions Precautions: Fall Restrictions Weight Bearing Restrictions: No      Mobility Bed Mobility Overal bed mobility: Needs Assistance Bed Mobility: Supine to Sit     Supine to sit: Supervision     General bed mobility comments: SBA    Transfers Overall transfer level: Needs assistance Equipment used: Rolling walker (2 wheels) Transfers: Sit to/from Stand, Bed to chair/wheelchair/BSC Sit to Stand: Min assist, +2 physical assistance     Step pivot transfers: Min assist, +2 safety/equipment     General transfer comment: unsteady      Balance Overall balance assessment: Needs assistance Sitting-balance support: No upper extremity supported, Feet unsupported Sitting balance-Leahy Scale: Fair     Standing balance support: Bilateral upper extremity supported, Reliant on assistive device for balance Standing balance-Leahy Scale: Poor Standing balance comment: does better with BUE on RW but still has LOB requiring some assist from PT to correct when attempting to take step backwards towards recliner  ADL either performed or assessed with clinical judgement   ADL                                          General ADL Comments: Pt required set up and SBA for donning socks while sitting unsupported EOB. MIN A for LB ADL otherwise including when involving ADL transfers. Set up and supv for seated UB ADL, with PRN MIN A due to rib pain. Pt required CGA and set up in standing to complete pericare, BLE braced against the EOB.     Vision         Perception     Praxis      Pertinent Vitals/Pain Pain Assessment Pain Assessment: Faces Faces Pain Scale: Hurts a little bit Pain Location: chest/ribs Pain Descriptors / Indicators: Aching, Guarding, Grimacing Pain Intervention(s): Limited activity within patient's tolerance, Monitored during session, Repositioned     Hand Dominance     Extremity/Trunk Assessment Upper Extremity Assessment Upper Extremity Assessment: Generalized weakness   Lower Extremity Assessment Lower Extremity Assessment: Generalized weakness       Communication     Cognition Arousal/Alertness: Awake/alert Behavior During Therapy: WFL for tasks assessed/performed Overall Cognitive Status: No family/caregiver present to determine baseline cognitive functioning                                 General Comments: Pt alert, oriented to hospital and self, no recall of getting CPR or why he is hospitalized, cues intermittently during session for reason he's here (believes he's just sore from working at at [employer]). Decreased safety awareness and awareness of deficits. Requires cues.     General Comments       Exercises     Shoulder Instructions      Home Living Family/patient expects to be discharged to:: Group home                                        Prior Functioning/Environment               Mobility Comments: Pt denies use of AD for mobility at baseline ADLs Comments: Pt endorses able to complete showering, dressing, and toileting without drect assist at baseline.        OT Problem  List: Decreased strength;Decreased activity tolerance;Decreased safety awareness;Decreased knowledge of use of DME or AE;Impaired balance (sitting and/or standing);Decreased cognition;Pain      OT Treatment/Interventions: Self-care/ADL training;Therapeutic exercise;Therapeutic activities;DME and/or AE instruction;Patient/family education;Balance training    OT Goals(Current goals can be found in the care plan section) Acute Rehab OT Goals Patient Stated Goal: get better and go home OT Goal Formulation: With patient Time For Goal Achievement: 04/27/22 Potential to Achieve Goals: Good ADL Goals Pt Will Perform Lower Body Dressing: sit to/from stand;with supervision Pt Will Transfer to Toilet: with supervision;ambulating;bedside commode ([facility]) Pt Will Perform Toileting - Clothing Manipulation and hygiene: with modified independence Additional ADL Goal #1: Pt will complete all aspects of bathing, primarily from seated position, with supervision for safety.  OT Frequency: Min 2X/week    Co-evaluation PT/OT/SLP Co-Evaluation/Treatment: Yes Reason for Co-Treatment: For patient/therapist safety;To address functional/ADL transfers PT goals addressed during session: Mobility/safety with mobility;Balance;Proper use of DME OT goals addressed during session:  ADL's and self-care;Proper use of Adaptive equipment and DME      AM-PAC OT "6 Clicks" Daily Activity     Outcome Measure Help from another person eating meals?: None Help from another person taking care of personal grooming?: A Little Help from another person toileting, which includes using toliet, bedpan, or urinal?: A Little Help from another person bathing (including washing, rinsing, drying)?: A Little Help from another person to put on and taking off regular upper body clothing?: A Little Help from another person to put on and taking off regular lower body clothing?: A Little 6 Click Score: 19   End of Session Equipment Utilized During  Treatment: Rolling walker (2 wheels);Oxygen Nurse Communication: Mobility status  Activity Tolerance: Patient tolerated treatment well Patient left: in chair;with call bell/phone within reach;with nursing/sitter in room  OT Visit Diagnosis: Other abnormalities of gait and mobility (R26.89);Muscle weakness (generalized) (M62.81);Other symptoms and signs involving cognitive function                Time: 1020-1040 OT Time Calculation (min): 20 min Charges:  OT General Charges $OT Visit: 1 Visit OT Evaluation $OT Eval Moderate Complexity: 1 Mod  Ardeth Perfect., MPH, MS, OTR/L ascom [phone] 04/13/22, 1:41 PM

## 2022-04-13 NOTE — Progress Notes (Signed)
[place] alert called on patietn beginning of shift secondary to agitation confusion trying to get out of bed combativeness even after 50 fentanyl and 2 versed  Actions - Patient was able to rest and was cooperative, however he did require additional 100 of fentanyl and 4 of versed to manage his agitation even with the newly ordered oral geodan. Psych consult may be beneficial for management of underlying mental illness. There is compounding problem issue of prolonged QT therefore, no additional antipsychotic meds were ordered

## 2022-04-13 NOTE — Progress Notes (Signed)
Progress Note   Patient: Riley Stuart MVH:[id number] DOB: [birth year] DOA: 04/08/2022     5 DOS: the patient was seen and examined on 04/13/2022   Brief hospital course: Riley Stuart is a 71 year old male with a past medical history significant for ruptured cerebral aneurysm with subarachnoid hemorrhage in 2015, hypertension, diabetes mellitus, renal cell carcinoma on chemotherapy, and chronic kidney disease stage IV who presented to to [facility] ED on 04/08/2022 following out-of-hospital cardiac arrest.  Patient is currently intubated and sedated with no family available, therefore history is obtained from chart review.   Per ED and nursing notes, the patient resides at [facility] where he was found unresponsive in a chair with left sided facial drooping by staff member.  Patient did have pulses at that time.  His brother actually talked to on the phone earlier this morning, and seemed to be in his usually state of health.  Fire Department reported he had thready pulses, of which then he lost his pulse.  EMS arrived at 09:50, initial rhythm was asystole, he received 4 rounds of epinephrine with ROSC after about 12 minutes.  EMS attempted to intubate with [facility] airway, but were unable to due to gagging.  Patient again lost his pulse, initially asystole and then V-fib for which he received 300 mg of amiodarone and 1 defibrillation.  Upon arrival to the ED, remained pulseless (asystole).  He required another several rounds of CPR and epinephrine.  He was successfully intubated by ED provider, who noted copious secretions.  ROSC briefly obtained, but then again lost pulse  Total estimated time of ACLS is approximately 50 minutes, and while in ED received a total of 4 mg of epinephrine, 1g calcium, and 1 amp bicarb.     Following ROSC, he was noted to be gagging on ETT, therefore low dose propofol started. EDP provider did note continued left facial droop and not responding to pain.  08/15  -patient's care was transferred to [facility] and overnight noted to have worsening respiratory distress.  Seen by PCCM and was placed on [place]. Seen during rounds and noted to be tachypneic.  Patient denied any new complaints.  Transferred back to ICU due to respiratory distress and concerns for reintubation.  8/17: weaning down oxygen. delirious     Assessment and Plan: Cardiac arrest [facility]) Patient presented after cardiac arrest with initial rhythm of asystole and subsequent rhythms notable for ventricular tachycardia. Received a total of 50 minutes of CPR. Intubated in the emergency room on 04/08/22 and was extubated on 04/09/22. Transferred back to ICU on 815 due to worsening respiratory distress and is currently on HFNC - PT/OT consulted  Aspiration pneumonia of both lower lobes [name]) Place patient on strict aspiration precautions. Cxr 8/15 with partial collapse rll. Cta 8/13 without signs PE Continue antibiotic therapy  Antibiotics started 8/13, plan for 7 day course. Will de-escalate zosyn to unasyn SLP consulted, advising dysphagia 2 diet CT of chest if respiratory status worsens Continue o2, weaing as able  NSTEMI (non-ST elevated myocardial infarction) ([place]) Noted to have elevated troponins in the setting of demand ischemia versus non-ST elevation MI. Twelve-lead EKG with chronic left bundle branch block. Patient was on a heparin drip for 48 hours with goal to transition to Eliquis. Not a candidate for left heart cath due to stage IV chronic kidney disease Patient had a 2D echocardiogram with normal LVEF of 60% Seen by cardiology, plans for ischemic work-up on hold since patient is a poor candidate  for left heart cath due to chronic kidney disease Continue aspirin and metoprolol  Schizophrenia Delirium Has underlying schizophrenia, here appears delirious 2/2 above processes. Group home says he is relatively cogent at baseline. Ct head nothing acute. Group home says  he has capacity at baseline to make his medical decisions. Significant agitation yesterday, much improved today. Discontinuation of steroids likely helping - cont lamictal, geodon - tele-sitter  Acute respiratory failure with hypoxia ([name]) Most likely secondary to pneumonia (community acquired versus aspiration) Initially intubated but has been extubated and is currently on high flow nasal cannula 50%/50L. Seen by PCCM for evaluation of respiratory distress and started on systemic steroids and bronchodilator therapy. 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 Hold Cabozantinib  AKI on CKD (chronic kidney disease) stage 4, GFR 15-29 ml/min ([name]) Patient with a history of stage IV chronic kidney disease now worsened and most likely related to contrast patient received for CT angio done on admission to rule out pulmonary embolism Gfr stable in the upper teens, slowly improving with good uop Nephrology following  Type 2 diabetes mellitus with diabetic chronic kidney disease ([name]) SSI  Thrombocytopenia Anemia Plts now normalized, hgb 9.6 which is also improving, no report of bleeding - monitor for now  Hx PE Cont eliquis    Subjective: confused but alert, mild chest wall pain, less agitated than yesterday  Physical Exam: Vitals:   04/13/22 1005 04/13/22 1040 04/13/22 1100 04/13/22 1145  BP: (!) 158/97 (!) 159/103 (!) 150/91   Pulse: 81  78 84  Resp: 19 (!) '25 16 19  '$ Temp:      TempSrc:      SpO2: 98%  99% 100%  Weight:      Height:       General: Elderly gentleman, chronically ill appearing, on high flow nasal cannula, very talkative and confused HEENT: [place]/AT, moist  mucous membranes, sclera anicteric.   Neuro: Moving all extremities CV: rrr, s1s2, no murmurs PULM: Scattered rhonchi and wheezes in all lung fields GI: soft, non-tender, non-distended, BS+ Extremities: warm, no edema   Data Reviewed: Relevant notes from primary care and specialist visits, past discharge summaries as available in EHR, including Care Everywhere. Prior diagnostic testing as pertinent to current admission diagnoses Updated medications and problem lists for reconciliation ED course, including vitals, labs, imaging, treatment and response to treatment Triage notes, nursing and pharmacy notes and ED provider's notes Notable results as noted in HPI  There are no new results to review at this time.  Family Communication: no answer when brother Riley Stuart called today.  Disposition: Status is: Inpatient Remains inpatient appropriate because: requiring high flow [place]  Planned Discharge Destination: To be determined   Author: Desma Maxim, MD 04/13/2022 12:18 PM  For on call review [email or web address].

## 2022-04-13 NOTE — Evaluation (Addendum)
Physical Therapy Evaluation Patient Details Name: Riley Stuart MRN: [id number] DOB: May 17, 1951 Today's Date: 04/13/2022  History of Present Illness  Pt is a 71 y/o M admitted on 04/08/22 after presenting following out-of-hospital cardiac arrest. Pt received a total of 50 minutes of CPR. Pt was intubated on 8/13, extubated on 8/14 & transferred back to ICU on 8/15 & placed on HFNC 2/2 worsening respiratory distress. PMH: ruptured cerebral aneurysm with [place] 2015, HTN, DM, renal cell carcinoma on chemotherapy, CKD4  Clinical Impression  Pt seen for PT evaluation with co-tx with OT. Pt is AxO to self & location only, is also able to provide home setup information & PLOF. Pt reports prior to admission he was independent without AD, denies falls, & notes he works out at [employer]. On this date, pt is able to complete supine>sit with supervision, STS with min assist +2 fade to +1, and stand pivot with mod assist +1 with 2nd person managing lines/leads. Pt attempts to ambulate with RW but only takes a few steps requiring cuing for safe use of AD & up to mod assist for balance 2/2 posterior LOB. Pt declines further gait 2/2 wanting to eat more breakfast. Pt would benefit from STR upon d/c to maximize independence with functional mobility & reduce fall risk prior to return home. Will continue to follow pt acutely to address balance, strengthening, and gait with LRAD.    Recommendations for follow up therapy are one component of a multi-disciplinary discharge planning process, led by the attending physician.  Recommendations may be updated based on patient status, additional functional criteria and insurance authorization.  Follow Up Recommendations Skilled nursing-short term rehab (<3 hours/day) Can patient physically be transported by private vehicle: No    Assistance Recommended at Discharge Frequent or constant Supervision/Assistance  Patient can return home with the following  A lot of help with  walking and/or transfers;A lot of help with bathing/dressing/bathroom;Assistance with feeding;Assist for transportation;Assistance with cooking/housework;Help with stairs or ramp for entrance;Direct supervision/assist for medications management;Direct supervision/assist for financial management    Equipment Recommendations None recommended by PT  Recommendations for Other Services    Speech consult   Functional Status Assessment Patient has had a recent decline in their functional status and demonstrates the ability to make significant improvements in function in a reasonable and predictable amount of time.     Precautions / Restrictions Precautions Precautions: Fall Restrictions Weight Bearing Restrictions: No      Mobility  Bed Mobility Overal bed mobility: Needs Assistance Bed Mobility: Supine to Sit     Supine to sit: Supervision, HOB elevated     General bed mobility comments: use of bed rails PRN    Transfers Overall transfer level: Needs assistance Equipment used: 1 person hand held assist Transfers: Sit to/from Stand, Bed to chair/wheelchair/BSC Sit to Stand: Min assist (Pt initially transfers STS with HHA +2 with min assist+2, on 2nd attempt pt transfers STS with min assist HHA +1.)   Step pivot transfers: Min assist, Mod assist, +2 safety/equipment (min/mod assist +1 with HHA with 2nd person managing lines/leads to step pivot bed>recliner on L; pt with decreased balance posteriorly requiring mod assist to correct.)            Ambulation/Gait Ambulation/Gait assistance: Min assist, Mod assist Gait Distance (Feet):  (2 ft forwards + backwards) Assistive device: Rolling walker (2 wheels) Gait Pattern/deviations: Decreased step length - right, Decreased step length - left, Decreased dorsiflexion - right, Decreased dorsiflexion - left, Decreased stride  length Gait velocity: decreased.     General Gait Details: Pt attempts to take shuffled steps pushing RW out in  front of him, requires cuing to ambulate within base of AD with pt able to correct with assistance. Pt with decreased heel strike BLE, decreased step & stride length. Pt declines further gait 2/2 wanting to eat more breakfast.  Stairs            Wheelchair Mobility    Modified Rankin (Stroke Patients Only)       Balance Overall balance assessment: Needs assistance Sitting-balance support: No upper extremity supported, Feet unsupported Sitting balance-Leahy Scale: Fair Sitting balance - Comments: supervision sitting EOB   Standing balance support: No upper extremity supported, Single extremity supported, During functional activity Standing balance-Leahy Scale: Poor                               Pertinent Vitals/Pain Pain Assessment Pain Assessment: Faces Faces Pain Scale: Hurts little more Pain Location: chest/ribs Pain Descriptors / Indicators: Aching, Guarding, Grimacing Pain Intervention(s): Monitored during session, Repositioned    Home Living Family/patient expects to be discharged to:: Group home                   Additional Comments: Lives in a group home, pt reports it's 1 level with level entry/threshold step entry.    Prior Function               Mobility Comments: Pt reports he ambulates without AD, denies falls, reports he goes to the [facility] to workout. ADLs Comments: Pt endorses able to complete showering, dressing, and toileting without drect assist at baseline.     Hand Dominance        Extremity/Trunk Assessment   Upper Extremity Assessment Upper Extremity Assessment: Generalized weakness (limited by soreness 2/2 rib fx)    Lower Extremity Assessment Lower Extremity Assessment: Generalized weakness       Communication   Communication:  (Pt with speech variations but clear & easy to understand, no alarming hearing deficits.)  Cognition Arousal/Alertness: Awake/alert Behavior During Therapy: WFL for tasks  assessed/performed Overall Cognitive Status: No family/caregiver present to determine baseline cognitive functioning                                 General Comments: Pt oriented to self, location, & can provide PLOF & home set up information. Pt perseverative on being sore 2/2 working out at [employer]. Pt not oriented to situation. Follows simple commands throughout session.        General Comments General comments (skin integrity, edema, etc.): BP at end of session 159/103 mmHg (MAP 119) - nurse notified of elevated diastolic BP; pt on 16X/WRU via HFNC with lowest SpO2 86%, pt educated on pursed lip breathing    Exercises     Assessment/Plan    PT Assessment Patient needs continued PT services  PT Problem List Decreased strength;Cardiopulmonary status limiting activity;Decreased activity tolerance;Decreased balance;Decreased mobility;Decreased safety awareness;Decreased knowledge of use of DME;Decreased cognition       PT Treatment Interventions Therapeutic exercise;Gait training;Balance training;DME instruction;Stair training;Neuromuscular re-education;Functional mobility training;Therapeutic activities;Patient/family education;Cognitive remediation    PT Goals (Current goals can be found in the Care Plan section)  Acute Rehab PT Goals Patient Stated Goal: return to PLOF PT Goal Formulation: With patient Time For Goal Achievement: 04/27/22 Potential to Achieve Goals:  Good    Frequency Min 2X/week     Co-evaluation PT/OT/SLP Co-Evaluation/Treatment: Yes Reason for Co-Treatment: Complexity of the patient's impairments (multi-system involvement);Necessary to address cognition/behavior during functional activity;For patient/therapist safety PT goals addressed during session: Mobility/safety with mobility;Balance OT goals addressed during session: ADL's and self-care;Proper use of Adaptive equipment and DME       AM-PAC PT "6 Clicks" Mobility  Outcome Measure  Help needed turning from your back to your side while in a flat bed without using bedrails?: None Help needed moving from lying on your back to sitting on the side of a flat bed without using bedrails?: A Little Help needed moving to and from a bed to a chair (including a wheelchair)?: A Lot Help needed standing up from a chair using your arms (e.g., wheelchair or bedside chair)?: A Little Help needed to walk in hospital room?: A Lot Help needed climbing 3-5 steps with a railing? : Total 6 Click Score: 15    End of Session Equipment Utilized During Treatment: Oxygen Activity Tolerance: Patient tolerated treatment well Patient left: in chair;with nursing/sitter in room Nurse Communication: Mobility status (BP) PT Visit Diagnosis: Unsteadiness on feet (R26.81);Muscle weakness (generalized) (M62.81);Difficulty in walking, not elsewhere classified (R26.2)    Time: 1020-1040 PT Time Calculation (min) (ACUTE ONLY): 20 min   Charges:              Lavone Nian, PT, DPT 04/13/22, 1:53 PM   Waunita Schooner 04/13/2022, 1:52 PM

## 2022-04-14 DIAGNOSIS — I469 Cardiac arrest, cause unspecified: Secondary | ICD-10-CM | POA: Diagnosis not present

## 2022-04-14 LAB — GLUCOSE, CAPILLARY
Glucose-Capillary: 99 mg/dL (ref 70–99)
Glucose-Capillary: 150 mg/dL — ABNORMAL HIGH (ref 70–99)
Glucose-Capillary: 122 mg/dL — ABNORMAL HIGH (ref 70–99)
Glucose-Capillary: 130 mg/dL — ABNORMAL HIGH (ref 70–99)

## 2022-04-14 LAB — BASIC METABOLIC PANEL
Anion gap: 4 — ABNORMAL LOW (ref 5–15)
BUN: 51 mg/dL — ABNORMAL HIGH (ref 8–23)
CO2: 20 mmol/L — ABNORMAL LOW (ref 22–32)
Calcium: 8.2 mg/dL — ABNORMAL LOW (ref 8.9–10.3)
Chloride: 118 mmol/L — ABNORMAL HIGH (ref 98–111)
Creatinine, Ser: 2.97 mg/dL — ABNORMAL HIGH (ref 0.61–1.24)
GFR, Estimated: 22 mL/min — ABNORMAL LOW (ref >60)
Glucose, Bld: 99 mg/dL (ref 70–99)
Potassium: 4.2 mmol/L (ref 3.5–5.1)
Sodium: 142 mmol/L (ref 135–145)

## 2022-04-14 NOTE — TOC Progression Note (Addendum)
Transition of Care [name]) - Progression Note    Patient Details  Name: Riley Stuart MRN: [id number] Date of Birth: 01-May-1951  Transition of [facility]) CM/SW [place], LCSW Phone Number: 04/14/2022, 11:34 AM  Clinical Narrative:    PT rec SNF. Called Ann[facility] Needs, she stated they agree patient needs SNF before returning to his [name]. [name] stated she is the primary contact and she will keep the patient's brothers who live out of town updated. No SNF preference, prefer somewhere local. CSW started SNF workup. PASRR PENDING.  12:37- Info uploaded to [place] [facility] for PASRR. PASRR STILL PENDING.  Expected Discharge Plan: Group Home Barriers to Discharge: Continued Medical Work up  Expected Discharge Plan and Services Expected Discharge Plan: Group Home     Post Acute Care Choice: Resumption of Svcs/PTA Provider Living arrangements for the past 2 months: Group Home                                       Social Determinants of Health (SDOH) Interventions    Readmission Risk Interventions    04/10/2022   12:00 PM  Readmission Risk Prevention Plan  PCP or Specialist appointment within 3-5 days of discharge Complete  SW Recovery Care/Counseling Consult Complete  [place] Not Applicable

## 2022-04-14 NOTE — Progress Notes (Signed)
SLP Cancellation Note  Patient Details Name: ARNAV CREGG MRN: [id number] DOB: Oct 05, 1950   Cancelled treatment:       Reason Eval/Treat Not Completed:  (recieved order for Cog-language evaluation. Will f/u w/ full evaluation tomrrow. Pt is known to this service for prior BSE last week.)      Orinda Kenner, MS, CCC-SLP Speech Language Pathologist Rehab Services; [place] [phone] (ascom) Jodine Muchmore 04/14/2022, 11:15 AM

## 2022-04-14 NOTE — Progress Notes (Signed)
Progress Note   Patient: Riley Stuart XBM:[id number] DOB: 25-Nov-1950 DOA: 04/08/2022     6 DOS: the patient was seen and examined on 04/14/2022   Brief hospital course: Riley Stuart is a 71 year old male with a past medical history significant for ruptured cerebral aneurysm with subarachnoid hemorrhage in 2015, hypertension, diabetes mellitus, renal cell carcinoma on chemotherapy, and chronic kidney disease stage IV who presented to to [facility] ED on 04/08/2022 following out-of-hospital cardiac arrest.  Patient is currently intubated and sedated with no family available, therefore history is obtained from chart review.   Per ED and nursing notes, the patient resides at [facility] where he was found unresponsive in a chair with left sided facial drooping by staff member.  Patient did have pulses at that time.  His brother actually talked to on the phone earlier this morning, and seemed to be in his usually state of health.  Fire Department reported he had thready pulses, of which then he lost his pulse.  EMS arrived at 09:50, initial rhythm was asystole, he received 4 rounds of epinephrine with ROSC after about 12 minutes.  EMS attempted to intubate with [facility] airway, but were unable to due to gagging.  Patient again lost his pulse, initially asystole and then V-fib for which he received 300 mg of amiodarone and 1 defibrillation.  Upon arrival to the ED, remained pulseless (asystole).  He required another several rounds of CPR and epinephrine.  He was successfully intubated by ED provider, who noted copious secretions.  ROSC briefly obtained, but then again lost pulse  Total estimated time of ACLS is approximately 50 minutes, and while in ED received a total of 4 mg of epinephrine, 1g calcium, and 1 amp bicarb.     Following ROSC, he was noted to be gagging on ETT, therefore low dose propofol started. EDP provider did note continued left facial droop and not responding to pain.  08/15  -patient's care was transferred to [facility] and overnight noted to have worsening respiratory distress.  Seen by PCCM and was placed on [place]. Seen during rounds and noted to be tachypneic.  Patient denied any new complaints.  Transferred back to ICU due to respiratory distress and concerns for reintubation.  8/17: weaning down oxygen. Delirious  8/19: o2 weaned to 2 L, delirium resolved     Assessment and Plan: Cardiac arrest [facility]) Patient presented after cardiac arrest with initial rhythm of asystole and subsequent rhythms notable for ventricular tachycardia. Received a total of 50 minutes of CPR. Intubated in the emergency room on 04/08/22 and was extubated on 04/09/22. Transferred back to ICU on 815 due to worsening respiratory distress and is currently on HFNC - PT/OT consulted, advising snf, will discuss w/ TOC  Aspiration pneumonia of both lower lobes ([place])  Cxr 8/15 with partial collapse rll. Cta 8/13 without signs PE Continue antibiotic therapy  Antibiotics started 8/13, plan for 7 day course. Will de-escalate zosyn to augmentin, plan on abx through tomorrow SLP consulted, advising dysphagia 2 diet CT of chest if respiratory status worsens Continue o2, weaing as able  NSTEMI (non-ST elevated myocardial infarction) ([place]) Noted to have elevated troponins in the setting of demand ischemia versus non-ST elevation MI. Twelve-lead EKG with chronic left bundle branch block. Patient was on a heparin drip for 48 hours with goal to transition to Eliquis. Not a candidate for left heart cath due to stage IV chronic kidney disease Patient had a 2D echocardiogram with normal LVEF of 60%  Seen by cardiology, plans for ischemic work-up on hold since patient is a poor candidate for left heart cath due to chronic kidney disease Continue aspirin and metoprolol  Schizophrenia Delirium Has underlying schizophrenia, here appears delirious 2/2 above processes. Group home says he is  relatively cogent at baseline. Ct head nothing acute. Group home says he has capacity at baseline to make his medical decisions. Delirium resolved, d/c of steroids likely helped - cont lamictal, geodon  Acute respiratory failure with hypoxia ([place]) Most likely secondary to pneumonia (community acquired versus aspiration) Initially intubated but has been extubated and is currently on high flow nasal cannula 50%/50L. Seen by PCCM for evaluation of respiratory distress and started on systemic steroids and bronchodilator therapy. 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 Hold Cabozantinib  AKI on CKD (chronic kidney disease) stage 4, GFR 15-29 ml/min ([name]) Patient with a history of stage IV chronic kidney disease now worsened and most likely related to contrast patient received for CT angio done on admission to rule out pulmonary embolism Gfr stable in the upper teens, slowly improving with good uop, today cr 2.97 Nephrology following  Type 2 diabetes mellitus with diabetic chronic kidney disease ([place]) SSI  Thrombocytopenia Anemia Plts now normalized, hgb 9.6 which is also improving, no report of bleeding - monitor for now  Hx PE Cont eliquis    Subjective: feeling well, tolerated diet  Physical Exam: Vitals:   04/14/22 0900 04/14/22 0941 04/14/22 1000 04/14/22 1100  BP:  (!) 185/98 (!) 150/88   Pulse: 95  88 82  Resp: '15  18 17  '$ Temp:      TempSrc:      SpO2: 100%  100% 100%  Weight:      Height:       General: Elderly gentleman, chronically ill appearing, very talkative HEENT: [place]/AT, moist mucous membranes, sclera anicteric.   Neuro: Moving all extremities CV: rrr,  s1s2, no murmurs PULM: Scattered rhonchi and wheezes in all lung fields GI: soft, non-tender, non-distended, BS+ Extremities: warm, no edema   Data Reviewed: Relevant notes from primary care and specialist visits, past discharge summaries as available in EHR, including Care Everywhere. Prior diagnostic testing as pertinent to current admission diagnoses Updated medications and problem lists for reconciliation ED course, including vitals, labs, imaging, treatment and response to treatment Triage notes, nursing and pharmacy notes and ED provider's notes Notable results as noted in HPI  There are no new results to review at this time.  Family Communication: no answer when brother brent called today.  Disposition: Status is: Inpatient Remains inpatient appropriate because: snf bed search  Planned Discharge Destination: snf?   Author: Desma Maxim, MD 04/14/2022 11:20 AM  For on call review [email or web address].

## 2022-04-14 NOTE — Progress Notes (Signed)
[facility]  ROUNDING NOTE   Subjective:   Riley Stuart is a 71 y.o. male with past medical history including hypertension, diabetes, renal cell carcinoma, with partial nephrectomy, and chronic kidney disease.  Patient presents to the emergency department from his group home with reports of a facial droop.  Patient has been admitted for Cardiac arrest [facility]) [I46.9] Lactic acidosis [E87.20] Aspiration pneumonia of both lower lobes, unspecified aspiration pneumonia type [facility]) [J69.0]  Patient known to our practice and is followed outpatient by Dr. Juleen China.  Patient was last seen in the office 01/24/2022 by Dr. Holley Raring for routine follow-up.    -Appears to be doing better today.  Sitting up in the bed eating breakfast.  Objective:  Vital signs in last 24 hours:  Temp:  [98.1 F (36.7 C)-98.4 F (36.9 C)] 98.2 F (36.8 C) (08/18 2000) Pulse Rate:  [56-108] 81 (08/19 0715) Resp:  [13-26] 17 (08/19 0715) BP: (120-178)/(69-113) 161/87 (08/19 0700) SpO2:  [89 %-100 %] 100 % (08/19 0715) FiO2 (%):  [32 %] 32 % (08/19 0600) Weight:  [100.2 kg] 100.2 kg (08/19 0246)  Weight change: 0.2 kg Filed Weights   04/12/22 0500 04/13/22 0500 04/14/22 0246  Weight: 100.1 kg 100 kg 100.2 kg    Intake/Output: I/O last 3 completed shifts: In: 2306.2 [P.O.:2120; I.V.:174.7; IV Piggyback:11.5] Out: 2550 [Urine:2550]   Intake/Output this shift:  No intake/output data recorded.  Physical Exam: General: NAD  Head: Normocephalic, atraumatic. Moist oral mucosal membranes  Eyes: Anicteric  Lungs:  Transitioned off HFNC on , nonlabored  Heart: Regular rate on monitor, 2/6 murmur  Abdomen:  Nondistedned  Extremities: No peripheral edema.  Neurologic: Able to answer simple questions  Skin: No lesions  Access: None    Basic Metabolic Panel: Recent Labs  Lab 04/09/22 0213 04/10/22 0506 04/11/22 0353 04/12/22 0532 04/13/22 0904 04/14/22 0505  NA 143 138 141 140 143 142   K 3.8 3.8 4.1 4.1 4.4 4.2  CL 113* 108 113* 112* 116* 118*  CO2 19* 19* 21* 22 18* 20*  GLUCOSE 94 187* 118* 134* 143* 99  BUN 37* 42* 51* 51* 53* 51*  CREATININE 3.74* 4.09* 3.81* 3.48* 3.32* 2.97*  CALCIUM 8.5* 8.6* 8.3* 8.3* 8.7* 8.2*  MG 1.8 2.1 2.2 2.4 2.7*  --   PHOS 5.2* 4.1 4.0 3.7 3.5  --      Liver Function Tests: Recent Labs  Lab 04/08/22 1108 04/09/22 0213  AST 60* 137*  ALT 37 67*  ALKPHOS 55 36*  BILITOT 0.8 0.7  PROT 6.1* 5.6*  ALBUMIN 3.0* 2.8*    No results for input(s): "LIPASE", "AMYLASE" in the last 168 hours. No results for input(s): "AMMONIA" in the last 168 hours.  CBC: Recent Labs  Lab 04/08/22 1108 04/09/22 0213 04/10/22 0506 04/11/22 0353 04/12/22 0532 04/13/22 0904  WBC 10.3 12.1* 12.4* 11.3* 10.8* 11.3*  NEUTROABS 5.6  --   --   --   --   --   HGB 11.6* 11.3* 10.4* 8.8* 8.6* 9.6*  HCT 35.0* 32.1* 29.7* 24.5* 24.1* 28.1*  MCV 99.7 93.6 93.1 91.8 91.3 96.6  PLT 219 206 141* 122* 137* 166     Cardiac Enzymes: No results for input(s): "CKTOTAL", "CKMB", "CKMBINDEX", "TROPONINI" in the last 168 hours.  BNP: Invalid input(s): "POCBNP"  CBG: Recent Labs  Lab 04/13/22 0744 04/13/22 1117 04/13/22 1635 04/13/22 2135 04/14/22 0754  GLUCAP 162* 242* 127* 137* 99     Microbiology: Results for orders  placed or performed during the hospital encounter of 04/08/22  Urine Culture     Status: None   Collection Time: 04/08/22  1:37 PM   Specimen: Urine, Random  Result Value Ref Range Status   Specimen Description   Final    URINE, RANDOM Performed at [facility], [address], [place], [place] [address]    Special Requests   Final    NONE Performed at [facility], [address] [place], [place] [address]    Culture   Final    NO GROWTH Performed at [facility], [address], [place], [place] [address]    Report Status 04/09/2022 FINAL  Final  MRSA Next Gen by PCR, Nasal     Status: None    Collection Time: 04/08/22  3:00 PM   Specimen: Nasal Mucosa; Nasal Swab  Result Value Ref Range Status   MRSA by PCR Next Gen NOT DETECTED NOT DETECTED Final    Comment: (NOTE) The GeneXpert MRSA Assay (FDA approved for NASAL specimens only), is one component of a comprehensive MRSA colonization surveillance program. It is not intended to diagnose MRSA infection nor to guide or monitor treatment for MRSA infections. Test performance is not FDA approved in patients less than 16 years old. Performed at [facility], [address] [place], [place] [address]   Culture, blood (Routine X 2) w Reflex to ID Panel     Status: None   Collection Time: 04/08/22  6:01 PM   Specimen: BLOOD  Result Value Ref Range Status   Specimen Description BLOOD BLOOD LEFT FOREARM  Final   Special Requests IN PEDIATRIC BOTTLE Blood Culture adequate volume  Final   Culture   Final    NO GROWTH 5 DAYS Performed at [name], [address] [place], [place] [address]    Report Status 04/13/2022 FINAL  Final  Culture, blood (Routine X 2) w Reflex to ID Panel     Status: None   Collection Time: 04/08/22  6:01 PM   Specimen: BLOOD  Result Value Ref Range Status   Specimen Description BLOOD BLOOD LEFT HAND  Final   Special Requests   Final    BOTTLES DRAWN AEROBIC AND ANAEROBIC Blood Culture adequate volume   Culture   Final    NO GROWTH 5 DAYS Performed at [facility], [address], [place],  [address]    Report Status 04/13/2022 FINAL  Final    Coagulation Studies: No results for input(s): "LABPROT", "INR" in the last 72 hours.   Urinalysis: No results for input(s): "COLORURINE", "LABSPEC", "PHURINE", "GLUCOSEU", "HGBUR", "BILIRUBINUR", "KETONESUR", "PROTEINUR", "UROBILINOGEN", "NITRITE", "LEUKOCYTESUR" in the last 72 hours.  Invalid input(s): "APPERANCEUR"     Imaging: No results found.   Medications:    sodium chloride Stopped (04/08/22 1515)     amLODipine  10 mg Oral Daily   amoxicillin-clavulanate  1 tablet Oral BID   apixaban  2.5 mg Oral BID   arformoterol  15 mcg Nebulization BID   aspirin EC  81 mg Oral Daily   atorvastatin  10 mg Oral Daily   budesonide (PULMICORT) nebulizer solution  0.5 mg Nebulization BID   carvedilol  25 mg Oral BID WC   Chlorhexidine Gluconate Cloth  6 each Topical Q0600   insulin aspart  0-9 Units Subcutaneous TID WC   lamoTRIgine  100 mg Oral BID   mouth rinse  15 mL Mouth Rinse 4 times per day  pantoprazole  40 mg Oral Daily   revefenacin  175 mcg Nebulization Daily   tamsulosin  0.4 mg Oral Daily   ziprasidone  160 mg Oral QHS   acetaminophen, docusate sodium, ipratropium-albuterol, midazolam, mouth rinse, polyethylene glycol  Assessment/ Plan:  [name] is a 71 y.o.  male with past medical history including hypertension, diabetes, renal cell carcinoma, with partial nephrectomy, and chronic kidney disease.  Patient presents to the emergency department from his group home with reports of a facial droop.  Patient has been admitted for Cardiac arrest [facility]) [I46.9] Lactic acidosis [E87.20] Aspiration pneumonia of both lower lobes, unspecified aspiration pneumonia type ([place]) [J69.0]   Acute Kidney Injury on chronic kidney disease stage IV with baseline creatinine 3.4 and GFR of 18 on 01/17/22.  Acute kidney injury secondary to IV contrast nephropathy.  IV contrast exposure on 04/08/2022.  Kidney injury also likely due to lack of perfusion from cardiac arrest. Chronic kidney disease is secondary to renal cell carcinoma with partial left nephrectomy.    - Olmesartan and torsemide held.   - Avoid nephrotoxic agents and therapies.   - Creatinine peaked at 4.09 and is steadily improving down to 2.97 today.  Urine output of 1900 recorded from yesterday and 950 cc today. - Maybe establishing a new baseline creatinine.    Lab Results  Component Value Date   CREATININE 2.97 (H)  04/14/2022   CREATININE 3.32 (H) 04/13/2022   CREATININE 3.48 (H) 04/12/2022    Intake/Output Summary (Last 24 hours) at 04/14/2022 0757 Last data filed at 04/14/2022 0555 Gross per 24 hour  Intake 1880 ml  Output 1900 ml  Net -20 ml    2.  Aspiration pneumonia.  Transition off of high flow nasal cannula and receiving regular nasal cannula.  3. Diabetes mellitus type II with chronic kidney disease/renal manifestations: insulin dependent. Home regimen includes Humalog 75/25 and NovoLog 30/70. Most recent hemoglobin A1c is 5.7 on 04/09/22.   Primary team to manage sliding scale insulin.  4.  Acute metabolic acidosis.  Serum bicarbonate level Is improving  Continue to monitor for now.     LOS: 6 Marsia Cino 8/19/20237:57 AM

## 2022-04-14 NOTE — NC FL2 (Signed)
[place] LEVEL OF CARE SCREENING TOOL     IDENTIFICATION  Patient Name: Riley Stuart Birthdate: 1950-09-12 Sex: male Admission Date (Current Location): 04/08/2022  [facility] and [place] Number:  Engineering geologist and Address:  [facility], [address] [place], [place] [address]      Provider Number: [id number]  Attending Physician Name and Address:  Gwynne Edinger, MD  Relative Name and Phone Number:  Shearon Stalls [phone]   [phone] Group Home Coordinatior    Current Level of Care: Hospital Recommended Level of Care: [place] Prior Approval Number:    Date Approved/Denied:   PASRR Number: pending  Discharge Plan:      Current Diagnoses: Patient Active Problem List   Diagnosis Date Noted   Schizophrenia ([place]) 04/11/2022   History of pulmonary embolism 04/11/2022   Cardiac arrest ([place]) 04/08/2022   Acute respiratory failure with hypoxia ([place]) 04/08/2022   Community acquired pneumonia 04/08/2022   NSTEMI (non-ST elevated myocardial infarction) ([place]) 04/08/2022   Aspiration pneumonia of both lower lobes ([place])    Callus 08/14/2021   Plantar flexed metatarsal bone of left foot 08/14/2021   Renal cell carcinoma ([place]) 05/18/2021   Pulmonary infarct ([place]) 05/12/2021   Anemia due to stage 4 chronic kidney disease ([place]) 05/12/2021   Anemia in chronic kidney disease 02/08/2021   Encounter for antineoplastic chemotherapy 01/18/2021   CKD (chronic kidney disease) stage 4, GFR 15-29 ml/min ([place]) 01/18/2021   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   Pain due to onychomycosis of toenails of both feet 11/21/2020   Goals of care, counseling/discussion    Polyp of colon    Cancer of kidney, left ([place]) 08/10/2019   Benign hypertensive kidney disease with chronic kidney disease 06/08/2019   Proteinuria 06/08/2019   Renal mass 06/08/2019   Stage 3 chronic kidney disease  ([place]) 06/08/2019   Type 2 diabetes mellitus with diabetic chronic kidney disease ([place]) 06/08/2019   Low back pain 01/05/2014   Leg length discrepancy left 01/05/2014   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   SAH (subarachnoid hemorrhage) ([place]) 10/26/2013    Orientation RESPIRATION BLADDER Height & Weight     Self, Place  O2 External catheter Weight: 220 lb 14.4 oz (100.2 kg) Height:  6' (182.9 cm)  BEHAVIORAL SYMPTOMS/MOOD NEUROLOGICAL BOWEL NUTRITION STATUS      Incontinent Diet (dys 2)  AMBULATORY STATUS COMMUNICATION OF NEEDS Skin   Limited Assist Verbally Skin abrasions, Bruising                       Personal Care Assistance Level of Assistance  Bathing, Feeding, Dressing Bathing Assistance: Limited assistance Feeding assistance: Limited assistance Dressing Assistance: Limited assistance     Functional Limitations Info             SPECIAL CARE FACTORS FREQUENCY  PT (By licensed PT), OT (By licensed OT)     PT Frequency: 5 times per week OT Frequency: 5 times per week            Contractures      Additional Factors Info  Code Status, Allergies Code Status Info: full Allergies Info: nka           Current Medications (04/14/2022):  This is the current hospital active medication list Current Facility-Administered Medications  Medication Dose Route Frequency Provider Last Rate Last Admin   0.9 %  sodium chloride infusion  250 mL Intravenous  Continuous Dallie Piles, [name]   Held at 04/08/22 1515   acetaminophen (TYLENOL) tablet 1,000 mg  1,000 mg Oral Q6H PRN Gwynne Edinger, MD   1,000 mg at 04/11/22 2633   amLODipine (NORVASC) tablet 10 mg  10 mg Oral Daily Teressa Lower, NP   10 mg at 04/14/22 0941   amoxicillin-clavulanate (AUGMENTIN) 500-125 MG per tablet 500 mg  1 tablet Oral BID Gwynne Edinger, MD   500 mg at 04/14/22 0941   apixaban (ELIQUIS) tablet 2.5 mg  2.5 mg Oral BID Wynelle Cleveland, RPH   2.5 mg at 04/14/22 0941    arformoterol (BROVANA) nebulizer solution 15 mcg  15 mcg Nebulization BID Freddi Starr, MD   15 mcg at 04/14/22 3545   aspirin EC tablet 81 mg  81 mg Oral Daily End, Christopher, MD   81 mg at 04/14/22 0941   atorvastatin (LIPITOR) tablet 10 mg  10 mg Oral Daily Gwynne Edinger, MD   10 mg at 04/14/22 0941   budesonide (PULMICORT) nebulizer solution 0.5 mg  0.5 mg Nebulization BID Freda Jackson B, MD   0.5 mg at 04/14/22 0726   carvedilol (COREG) tablet 25 mg  25 mg Oral BID WC Gwynne Edinger, MD   25 mg at 04/14/22 6256   Chlorhexidine Gluconate Cloth 2 % PADS 6 each  6 each Topical Q0600 Bradly Bienenstock, NP   6 each at 04/13/22 2200   docusate sodium (COLACE) capsule 100 mg  100 mg Oral BID PRN Benita Gutter, RPH       insulin aspart (novoLOG) injection 0-9 Units  0-9 Units Subcutaneous TID WC Wouk, Ailene Rud, MD   1 Units at 04/13/22 1737   ipratropium-albuterol (DUONEB) 0.5-2.5 (3) MG/3ML nebulizer solution 3 mL  3 mL Nebulization Q4H PRN Foust, Katy L, NP   3 mL at 04/14/22 0048   lamoTRIgine (LAMICTAL) tablet 100 mg  100 mg Oral BID Candee Furbish, MD   100 mg at 04/14/22 0941   midazolam (VERSED) injection 1 mg  1 mg Intravenous Q4H PRN Foust, Katy L, NP   1 mg at 04/12/22 1642   Oral care mouth rinse  15 mL Mouth Rinse 4 times per day Gwynne Edinger, MD   15 mL at 04/14/22 3893   Oral care mouth rinse  15 mL Mouth Rinse PRN Wouk, Ailene Rud, MD       pantoprazole (PROTONIX) EC tablet 40 mg  40 mg Oral Daily Benita Gutter, RPH   40 mg at 04/14/22 0941   polyethylene glycol (MIRALAX / GLYCOLAX) packet 17 g  17 g Oral Daily PRN Benita Gutter, RPH       revefenacin (YUPELRI) nebulizer solution 175 mcg  175 mcg Nebulization Daily Freddi Starr, MD   175 mcg at 04/14/22 0726   tamsulosin (FLOMAX) capsule 0.4 mg  0.4 mg Oral Daily Candee Furbish, MD   0.4 mg at 04/14/22 0941   ziprasidone (GEODON) capsule 160 mg  160 mg Oral QHS Lorna Dibble, RPH   160  mg at 04/13/22 2126     Discharge Medications: Please see discharge summary for a list of discharge medications.  Relevant Imaging Results:  Relevant Lab Results:   Additional Information SS #: [id number]  [place], LCSW

## 2022-04-14 NOTE — Progress Notes (Signed)
RE: Riley Stuart Date of Birth: 13-Jan-2051 Date: 04/14/22   To Whom It May Concern:  Please be advised that the above-named patient will require a short-term nursing home stay - anticipated 30 days or less for rehabilitation and strengthening.  The plan is for return home.

## 2022-04-15 DIAGNOSIS — E1122 Type 2 diabetes mellitus with diabetic chronic kidney disease: Secondary | ICD-10-CM

## 2022-04-15 DIAGNOSIS — J690 Pneumonitis due to inhalation of food and vomit: Secondary | ICD-10-CM | POA: Diagnosis not present

## 2022-04-15 DIAGNOSIS — I469 Cardiac arrest, cause unspecified: Secondary | ICD-10-CM | POA: Diagnosis not present

## 2022-04-15 DIAGNOSIS — N184 Chronic kidney disease, stage 4 (severe): Secondary | ICD-10-CM | POA: Diagnosis not present

## 2022-04-15 DIAGNOSIS — J9601 Acute respiratory failure with hypoxia: Secondary | ICD-10-CM | POA: Diagnosis not present

## 2022-04-15 LAB — GLUCOSE, CAPILLARY
Glucose-Capillary: 111 mg/dL — ABNORMAL HIGH (ref 70–99)
Glucose-Capillary: 160 mg/dL — ABNORMAL HIGH (ref 70–99)
Glucose-Capillary: 123 mg/dL — ABNORMAL HIGH (ref 70–99)
Glucose-Capillary: 159 mg/dL — ABNORMAL HIGH (ref 70–99)

## 2022-04-15 LAB — BASIC METABOLIC PANEL
Anion gap: 3 — ABNORMAL LOW (ref 5–15)
BUN: 50 mg/dL — ABNORMAL HIGH (ref 8–23)
CO2: 23 mmol/L (ref 22–32)
Calcium: 8.1 mg/dL — ABNORMAL LOW (ref 8.9–10.3)
Chloride: 117 mmol/L — ABNORMAL HIGH (ref 98–111)
Creatinine, Ser: 2.96 mg/dL — ABNORMAL HIGH (ref 0.61–1.24)
GFR, Estimated: 22 mL/min — ABNORMAL LOW (ref >60)
Glucose, Bld: 139 mg/dL — ABNORMAL HIGH (ref 70–99)
Potassium: 3.8 mmol/L (ref 3.5–5.1)
Sodium: 143 mmol/L (ref 135–145)

## 2022-04-15 LAB — TROPONIN I (HIGH SENSITIVITY): Troponin I (High Sensitivity): 165 ng/L (ref <18)

## 2022-04-15 MED ORDER — ISOSORBIDE MONONITRATE ER 30 MG PO TB24
30.0000 mg | ORAL_TABLET | Freq: Every day | ORAL | Status: DC
  Administered 2022-04-15 – 2022-04-17 (×3): 30 mg via ORAL
  Filled 2022-04-15 (×3): qty 1

## 2022-04-15 NOTE — Progress Notes (Signed)
End of shift note:  Pt continues to be confused but it seems to have improved some since this morning. Pt did not need mittens during this shift. Tele sitter in placed. Pt's brother was updated this morning. ST elevation was reported by the tele monitor. STAT EKG and STAT troponin were obtained. Pt denied any s/s of chest pain or  SOB. Pt was able to wean off from 3 L [place] to RA. Incontinence care was provided. The plan is for the pt to go to a group home and we are waiting on bed offers.

## 2022-04-15 NOTE — Progress Notes (Signed)
Patient has pulled out IV twice for the shift even with telesitter monitoring. IV reinserted by IV team, mittens applied on both hand,telesitter updated on new IV location.

## 2022-04-15 NOTE — Progress Notes (Signed)
Provider Notification: Laurey Arrow MD  Tele just notified RN that pt has an ST elevation. Per report elevation is 3.7  at this time pt denies chest pain   Recommendations:  To obtain an EKG and troponin.

## 2022-04-15 NOTE — TOC Progression Note (Signed)
Transition of Care [facility]) - Progression Note    Patient Details  Name: Riley Stuart MRN: [id number] Date of Birth: 1951-06-10  Transition of Care [facility]) CM/SW Contact  Beverly Sessions, RN Phone Number: 04/15/2022, 11:17 AM  Clinical Narrative:     No bed offers, search extended PASSR still pending  Expected Discharge Plan: Group Home Barriers to Discharge: Continued Medical Work up  Expected Discharge Plan and Services Expected Discharge Plan: Group Home     Post Acute Care Choice: Resumption of Svcs/PTA Provider Living arrangements for the past 2 months: Group Home                                       Social Determinants of Health (SDOH) Interventions    Readmission Risk Interventions    04/10/2022   12:00 PM  Readmission Risk Prevention Plan  PCP or Specialist appointment within 3-5 days of discharge Complete  SW Recovery Care/Counseling Consult Complete  [place] Not Applicable

## 2022-04-15 NOTE — Evaluation (Addendum)
Speech Language Pathology Evaluation Patient Details Name: Riley Stuart MRN: [id number] DOB: 04/25/1951 Today's Date: 04/15/2022 Time: 8115-7262 SLP Time Calculation (min) (ACUTE ONLY): 15 min  Problem List:  Patient Active Problem List   Diagnosis Date Noted   Schizophrenia ([place]) 04/11/2022   History of pulmonary embolism 04/11/2022   Cardiac arrest ([place]) 04/08/2022   Acute respiratory failure with hypoxia ([place]) 04/08/2022   Community acquired pneumonia 04/08/2022   NSTEMI (non-ST elevated myocardial infarction) ([place]) 04/08/2022   Aspiration pneumonia of both lower lobes ([place])    Callus 08/14/2021   Plantar flexed metatarsal bone of left foot 08/14/2021   Renal cell carcinoma ([place]) 05/18/2021   Pulmonary infarct ([place]) 05/12/2021   Anemia due to stage 4 chronic kidney disease ([place]) 05/12/2021   Anemia in chronic kidney disease 02/08/2021   Encounter for antineoplastic chemotherapy 01/18/2021   CKD (chronic kidney disease) stage 4, GFR 15-29 ml/min ([place]) 01/18/2021   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[place]) 01/11/2021   Pain due to onychomycosis of toenails of both feet 11/21/2020   Goals of care, counseling/discussion    Polyp of colon    Cancer of kidney, left ([place]) 08/10/2019   Benign hypertensive kidney disease with chronic kidney disease 06/08/2019   Proteinuria 06/08/2019   Renal mass 06/08/2019   Stage 3 chronic kidney disease ([place]) 06/08/2019   Type 2 diabetes mellitus with diabetic chronic kidney disease ([place]) 06/08/2019   Low back pain 01/05/2014   Leg length discrepancy left 01/05/2014   Cerebral aneurysm rupture ([place]) 11/06/2013   SAH (subarachnoid hemorrhage) ([place]) 10/26/2013   Past Medical History:  Past Medical History:  Diagnosis Date   Chronic kidney disease    renal insufficiency   Diabetes mellitus without complication ([place])    Pt takes Insulin   Hypertension    Mental disorder    schizoaffective   Renal cell carcinoma ([place])     Wears dentures    full upper and lower   Past Surgical History:  Past Surgical History:  Procedure Laterality Date   ANKLE FRACTURE SURGERY     CATARACT EXTRACTION W/PHACO Left 03/08/2021   Procedure: CATARACT EXTRACTION PHACO AND INTRAOCULAR LENS PLACEMENT ([place]) LEFT DIABETIC 8.48 01:06.9;  Surgeon: Leandrew Koyanagi, MD;  Location[facility];  Service: Ophthalmology;  Laterality: Left;  Diabetic - insulin   CATARACT EXTRACTION W/PHACO Right 03/22/2021   Procedure: CATARACT EXTRACTION PHACO AND INTRAOCULAR LENS PLACEMENT (IOC) RIGHT DIABETIC 6.14 01:08.8;  Surgeon: Leandrew Koyanagi, MD;  Location: [facility];  Service: Ophthalmology;  Laterality: Right;  Diabetic - insulin   COLONOSCOPY WITH PROPOFOL N/A 04/15/2020   Procedure: COLONOSCOPY WITH PROPOFOL;  Surgeon: Virgel Manifold, MD;  Location: [facility];  Service: Endoscopy;  Laterality: N/A;   CRANIOTOMY Left 10/27/2013   Procedure: Craniotomy for Aneurysm Clipping;  Surgeon: Consuella Lose, MD;  Location: [facility];  Service: Neurosurgery;  Laterality: Left;   IR RADIOLOGIST EVAL & MGMT  11/22/2020   IR RADIOLOGIST EVAL & MGMT  12/21/2020   KNEE SURGERY     due to fracture.    ROBOTIC ASSITED PARTIAL NEPHRECTOMY Left 08/10/2019   Procedure: XI ROBOTIC ASSITED LAPAROSCOPIC  PARTIAL NEPHRECTOMY;  Surgeon: Raynelle Bring, MD;  Location: [facility];  Service: Urology;  Laterality: Left;   HPI:  Per H&P "Riley Stuart is a 71 year old male with a past medical history significant for ruptured cerebral aneurysm with subarachnoid hemorrhage in 2015, hypertension, diabetes mellitus, renal cell carcinoma on chemotherapy, and chronic kidney  disease stage IV who presented to to [facility] ED on 04/08/2022 following out-of-hospital cardiac arrest.  Patient is currently intubated and sedated with no family available, therefore history is obtained from chart review.     Per ED and nursing notes, the patient resides at  [facility] where he was found unresponsive in a chair with left sided facial drooping by staff member.  Patient did have pulses at that time.  His brother actually talked to on the phone earlier this morning, and seemed to be in his usually state of health.  Fire Department reported he had thready pulses, of which then he lost his pulse.  EMS arrived at 09:50, initial rhythm was asystole, he received 4 rounds of epinephrine with ROSC after about 12 minutes.  EMS attempted to intubate with [facility] airway, but were unable to due to gagging.  Patient again lost his pulse, initially asystole and then V-fib for which he received 300 mg of amiodarone and 1 defibrillation.  Upon arrival to the ED, remained pulseless (asystole).  He required another several rounds of CPR and epinephrine.  He was successfully intubated by ED provider, who noted copious secretions.  ROSC briefly obtained, but then again lost pulse  Total estimated time of ACLS is approximately 50 minutes, and while in ED received a total of 4 mg of epinephrine, 1g calcium, and 1 amp bicarb.       Following ROSC, he was noted to be gagging on ETT, therefore low dose propofol started. EDP provider did note continued left facial droop and not responding to pain."   Assessment / Plan / Recommendation Clinical Impression  Pt seen for cognitive-linguistic evaluation. Pt alert, restless, and notably confused. Difficult to redirect. NT and RN at bedside. Telesitter noted. B mitts donned. On 2L/min O2 via [place].  Pt's PLOF not confirmed as pt has no family/friends at bedside. Noted pt resides in group living home.  Assessment completed via informal means. Pt's speech is fluent and without s/sx anomia. Pt with minimally imprecise articulation likely due to dental status (pt reports owning dentures; not present for evaluation) with mild effect on speech intelligibility (~90% during informal conversational exchanges). Extraneous lingual movements persist at  rest; ?tardive dyskinesia. Pt with difficulty following 2-step basic and complex commands; suspect due to cognitive deficits. Pt presents with cognitive-linguistic deficits affecting temporal and situational orientation, attention, memory (short term, functional, suspect working), abstract reasoning, problem solving (non-verbal; verbal was [name]), and insight.   Anticipate need for frequent or constant supervision and post-acute SLP services at d/c in functional/home environment. SLP to sign off as this level of care.   RN made aware of results, recommendations, and SLP POC.      SLP Assessment  SLP Recommendation/Assessment: All further Speech Lanaguage Pathology  needs can be addressed in the next venue of care SLP Visit Diagnosis: Cognitive communication deficit (R41.841)    Recommendations for follow up therapy are one component of a multi-disciplinary discharge planning process, led by the attending physician.  Recommendations may be updated based on patient status, additional functional criteria and insurance authorization.    Follow Up Recommendations  Skilled nursing-short term rehab (<3 hours/day)    Assistance Recommended at Discharge  Frequent or constant Supervision/Assistance  Functional Status Assessment Patient has had a recent decline in their functional status and demonstrates the ability to make significant improvements in function in a reasonable and predictable amount of time.     SLP Evaluation Cognition  Overall Cognitive Status: No family/caregiver  present to determine baseline cognitive functioning Arousal/Alertness: Awake/alert Orientation Level: Oriented to person;Oriented to place (inconsistently oriented to time; date, month, year correct, but stated "last night was my first night in the hospital") Memory: Impaired Memory Impairment: Decreased recall of new information;Decreased short term memory Decreased Short Term Memory: Verbal basic Awareness:  Impaired Problem Solving: Impaired Problem Solving Impairment: Functional basic (verbal was [facility]) Behaviors: Restless;Impulsive;Verbal agitation Safety/Judgment: Impaired       Comprehension  Auditory Comprehension Overall Auditory Comprehension: Impaired Yes/No Questions: Within Functional Limits Commands: Impaired Two Step Basic Commands: 50-74% accurate Complex Commands: 50-74% accurate Conversation: Simple (WFL)    Expression Expression Primary Mode of Expression: Verbal Verbal Expression Overall Verbal Expression: Appears within functional limits for tasks assessed Initiation: No impairment Automatic Speech:  (social automatic)   Oral / Motor  Oral Motor/Sensory Function Overall Oral Motor/Sensory Function: Other (comment) Lingual ROM:  (extraneous lingual movements at rest, ?tardive dyskinesia; strength and ROM WFL) Motor Speech Overall Motor Speech: Impaired Respiration: Within functional limits Phonation: Normal Resonance: Within functional limits Articulation: Impaired (minimally imprecise; likely due to dental status; mild effect on intelligibility) Intelligibility: Intelligibility reduced Word: 75-100% accurate Motor Planning: Witnin functional limits           Cherrie Gauze, M.S., [facility] [phone] ([place])  Quintella Baton 04/15/2022, 9:07 AM

## 2022-04-15 NOTE — Progress Notes (Signed)
Provider Notification: Laurey Arrow MD       Pt's trop was 165  Recommendations:        Awaiting on orders.

## 2022-04-15 NOTE — Progress Notes (Signed)
Progress Note   Patient: Riley Stuart:[id number] DOB: [birth year] DOA: 04/08/2022     7 DOS: the patient was seen and examined on 04/15/2022   Brief hospital course: Riley Stuart is a 71 year old male with a past medical history significant for ruptured cerebral aneurysm with subarachnoid hemorrhage in 2015, hypertension, diabetes mellitus, renal cell carcinoma on chemotherapy, and chronic kidney disease stage IV who presented to to [name] ED on 04/08/2022 following out-of-hospital cardiac arrest.  Patient is currently intubated and sedated with no family available, therefore history is obtained from chart review.   Per ED and nursing notes, the patient resides at [name] where he was found unresponsive in a chair with left sided facial drooping by staff member.  Patient did have pulses at that time.  His brother actually talked to on the phone earlier this morning, and seemed to be in his usually state of health.  Fire Department reported he had thready pulses, of which then he lost his pulse.  EMS arrived at 09:50, initial rhythm was asystole, he received 4 rounds of epinephrine with ROSC after about 12 minutes.  EMS attempted to intubate with [name] airway, but were unable to due to gagging.  Patient again lost his pulse, initially asystole and then V-fib for which he received 300 mg of amiodarone and 1 defibrillation.  Upon arrival to the ED, remained pulseless (asystole).  He required another several rounds of CPR and epinephrine.  He was successfully intubated by ED provider, who noted copious secretions.  ROSC briefly obtained, but then again lost pulse  Total estimated time of ACLS is approximately 50 minutes, and while in ED received a total of 4 mg of epinephrine, 1g calcium, and 1 amp bicarb.     Following ROSC, he was noted to be gagging on ETT, therefore low dose propofol started. EDP provider did note continued left facial droop and not responding to pain.  08/15  -patient's care was transferred to [facility] and overnight noted to have worsening respiratory distress.  Seen by PCCM and was placed on [name]. Seen during rounds and noted to be tachypneic.  Patient denied any new complaints.  Transferred back to ICU due to respiratory distress and concerns for reintubation.  8/17: weaning down oxygen. Delirious  8/19: o2 weaned to 2 L, delirium resolved     Assessment and Plan: Cardiac arrest [name]) Patient presented after cardiac arrest with initial rhythm of asystole and subsequent rhythms notable for ventricular tachycardia. Received a total of 50 minutes of CPR. Intubated in the emergency room on 04/08/22 and was extubated on 04/09/22. Transferred back to ICU on 815 due to worsening respiratory distress and is currently on HFNC - PT/OT consulted, advising snf, bed search underway  Aspiration pneumonia of both lower lobes ([name])  Cxr 8/15 with partial collapse rll. Cta 8/13 without signs PE Now s/p 7 days abx (zosyn>augmentin) SLP consulted, advising dysphagia 2 diet Continue o2, weaing as able, on just 1 L today  NSTEMI (non-ST elevated myocardial infarction) ([name]) Noted to have elevated troponins in the setting of demand ischemia versus non-ST elevation MI. Twelve-lead EKG with chronic left bundle branch block. Patient was on a heparin drip for 48 hours with goal to transition to Eliquis. Not a candidate for left heart cath due to stage IV chronic kidney disease Patient had a 2D echocardiogram with normal LVEF of 60% Seen by cardiology, plans for ischemic work-up on hold since patient is a poor candidate for left heart cath  due to chronic kidney disease Continue aspirin and metoprolol  Schizophrenia Delirium Has underlying schizophrenia, here appears delirious 2/2 above processes. Group home says he is relatively cogent at baseline. Ct head nothing acute. Group home says he has capacity at baseline to make his medical decisions.  Delirium resolved, d/c of steroids likely helped - cont lamictal, geodon  Acute respiratory failure with hypoxia ([name]) Most likely secondary to pneumonia (community acquired versus aspiration) Initially intubated but has been extubated and is currently on high flow nasal cannula 50%/50L. Seen by PCCM for evaluation of respiratory distress and started on systemic steroids and bronchodilator therapy. 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 resume Cabozantinib at d/c  AKI on CKD (chronic kidney disease) stage 4, GFR 15-29 ml/min ([name]) Patient with a history of stage IV chronic kidney disease now worsened and most likely related to contrast patient received for CT angio done on admission to rule out pulmonary embolism Gfr stable in the upper teens, slowly improving with good uop, today cr 2.96 essentially unchanged from yesterday Nephrology following  Type 2 diabetes mellitus with diabetic chronic kidney disease ([name]) SSI  Thrombocytopenia Anemia Plts now normalized, hgb 9.6 which is also improving, no report of bleeding - monitor for now  Hx PE Cont eliquis  Hypertension BP remains moderately elevated - imdur added by cardiology today - cont amlodipine    Subjective: feeling well, tolerating diet  Physical Exam: Vitals:   04/15/22 0412 04/15/22 0755 04/15/22 0820 04/15/22 1200  BP: (!) 159/79  (!) 161/81   Pulse: 72 80 81   Resp: '20 18 18   '$ Temp: 98.1 F (36.7 C)  98.1 F (36.7 C)   TempSrc: Oral  Oral   SpO2: 95% 96%  93%  Weight: 100.5 kg     Height:       General: Elderly gentleman, chronically ill appearing, very talkative HEENT: [place]/AT, moist mucous  membranes, sclera anicteric.   Neuro: Moving all extremities CV: rrr, s1s2, no murmurs PULM: Scattered rhonchi and wheezes in all lung fields GI: soft, non-tender, non-distended, BS+ Extremities: warm, no edema Skin: bruising right chest   Data Reviewed: Relevant notes from primary care and specialist visits, past discharge summaries as available in EHR, including Care Everywhere. Prior diagnostic testing as pertinent to current admission diagnoses Updated medications and problem lists for reconciliation ED course, including vitals, labs, imaging, treatment and response to treatment Triage notes, nursing and pharmacy notes and ED provider's notes Notable results as noted in HPI  There are no new results to review at this time.  Family Communication: brother brent updated telephonically 8/20  Disposition: Status is: Inpatient Remains inpatient appropriate because: snf bed search  Planned Discharge Destination: snf   Author: Desma Maxim, MD 04/15/2022 2:59 PM  For on call review [email or web address].

## 2022-04-15 NOTE — Plan of Care (Signed)
  Problem: Clinical Measurements: Goal: Respiratory complications will improve Outcome: Not Progressing   Problem: Safety: Goal: Ability to remain free from injury will improve Outcome: Not Progressing   Problem: Respiratory: Goal: Ability to maintain a clear airway and adequate ventilation will improve Outcome: Not Progressing

## 2022-04-15 NOTE — Progress Notes (Signed)
[facility]  ROUNDING NOTE   Subjective:   Riley Stuart is a 71 y.o. male with past medical history including hypertension, diabetes, renal cell carcinoma, with partial nephrectomy, and chronic kidney disease.  Patient presents to the emergency department from his group home with reports of a facial droop.  Patient has been admitted for Cardiac arrest [facility]) [I46.9] Lactic acidosis [E87.20] Aspiration pneumonia of both lower lobes, unspecified aspiration pneumonia type [facility]) [J69.0]  Patient known to our practice and is followed outpatient by Dr. Juleen China.  Patient was last seen in the office 01/24/2022 by Dr. Holley Raring for routine follow-up.    Patient seen resting in bed Transferred out of ICU yesterday Remains on 2 L nasal cannula Appetite appropriate  Objective:  Vital signs in last 24 hours:  Temp:  [98.1 F (36.7 C)-98.7 F (37.1 C)] 98.1 F (36.7 C) (08/20 0820) Pulse Rate:  [72-103] 81 (08/20 0820) Resp:  [18-20] 18 (08/20 0820) BP: (152-180)/(75-100) 161/81 (08/20 0820) SpO2:  [94 %-98 %] 96 % (08/20 0755) Weight:  [100.5 kg] 100.5 kg (08/20 0412)  Weight change: 0.3 kg Filed Weights   04/13/22 0500 04/14/22 0246 04/15/22 0412  Weight: 100 kg 100.2 kg 100.5 kg    Intake/Output: I/O last 3 completed shifts: In: 400 [P.O.:400] Out: 3400 [Urine:3400]   Intake/Output this shift:  Total I/O In: 120 [P.O.:120] Out: -   Physical Exam: General: NAD  Head: Normocephalic, atraumatic. Moist oral mucosal membranes  Eyes: Anicteric  Lungs:  [place] Steps, nonlabored  Heart: Regular rate on monitor, 2/6 murmur  Abdomen:  Nondistedned  Extremities: No peripheral edema.  Neurologic: Able to answer simple questions  Skin: No lesions  Access: None    Basic Metabolic Panel: Recent Labs  Lab 04/09/22 0213 04/10/22 0506 04/11/22 0353 04/12/22 0532 04/13/22 0904 04/14/22 0505 04/15/22 0336  NA 143 138 141 140 143 142 143  K 3.8 3.8 4.1 4.1 4.4 4.2  3.8  CL 113* 108 113* 112* 116* 118* 117*  CO2 19* 19* 21* 22 18* 20* 23  GLUCOSE 94 187* 118* 134* 143* 99 139*  BUN 37* 42* 51* 51* 53* 51* 50*  CREATININE 3.74* 4.09* 3.81* 3.48* 3.32* 2.97* 2.96*  CALCIUM 8.5* 8.6* 8.3* 8.3* 8.7* 8.2* 8.1*  MG 1.8 2.1 2.2 2.4 2.7*  --   --   PHOS 5.2* 4.1 4.0 3.7 3.5  --   --      Liver Function Tests: Recent Labs  Lab 04/09/22 0213  AST 137*  ALT 67*  ALKPHOS 36*  BILITOT 0.7  PROT 5.6*  ALBUMIN 2.8*    No results for input(s): "LIPASE", "AMYLASE" in the last 168 hours. No results for input(s): "AMMONIA" in the last 168 hours.  CBC: Recent Labs  Lab 04/09/22 0213 04/10/22 0506 04/11/22 0353 04/12/22 0532 04/13/22 0904  WBC 12.1* 12.4* 11.3* 10.8* 11.3*  HGB 11.3* 10.4* 8.8* 8.6* 9.6*  HCT 32.1* 29.7* 24.5* 24.1* 28.1*  MCV 93.6 93.1 91.8 91.3 96.6  PLT 206 141* 122* 137* 166     Cardiac Enzymes: No results for input(s): "CKTOTAL", "CKMB", "CKMBINDEX", "TROPONINI" in the last 168 hours.  BNP: Invalid input(s): "POCBNP"  CBG: Recent Labs  Lab 04/14/22 1152 04/14/22 1603 04/14/22 2059 04/15/22 0827 04/15/22 1156  GLUCAP 150* 122* 130* 111* 160*     Microbiology: Results for orders placed or performed during the hospital encounter of 04/08/22  Urine Culture     Status: None   Collection Time: 04/08/22  1:37 PM   Specimen: Urine, Random  Result Value Ref Range Status   Specimen Description   Final    URINE, RANDOM Performed at [name], [address] [place], [place] [address]    Special Requests   Final    NONE Performed at [facility], [address], [place], [place] [address]    Culture   Final    NO GROWTH Performed at [facility], [place] [address], [place], [place] [address]    Report Status 04/09/2022 FINAL  Final  MRSA Next Gen by PCR, Nasal     Status: None   Collection Time: 04/08/22  3:00 PM   Specimen: Nasal Mucosa; Nasal Swab  Result Value Ref Range Status    MRSA by PCR Next Gen NOT DETECTED NOT DETECTED Final    Comment: (NOTE) The GeneXpert MRSA Assay (FDA approved for NASAL specimens only), is one component of a comprehensive MRSA colonization surveillance program. It is not intended to diagnose MRSA infection nor to guide or monitor treatment for MRSA infections. Test performance is not FDA approved in patients less than 41 years old. Performed at [facility], [address] [place], [place] [address]   Culture, blood (Routine X 2) w Reflex to ID Panel     Status: None   Collection Time: 04/08/22  6:01 PM   Specimen: BLOOD  Result Value Ref Range Status   Specimen Description BLOOD BLOOD LEFT FOREARM  Final   Special Requests IN PEDIATRIC BOTTLE Blood Culture adequate volume  Final   Culture   Final    NO GROWTH 5 DAYS Performed at [facility], [address], [place], [place] [address]    Report Status 04/13/2022 FINAL  Final  Culture, blood (Routine X 2) w Reflex to ID Panel     Status: None   Collection Time: 04/08/22  6:01 PM   Specimen: BLOOD  Result Value Ref Range Status   Specimen Description BLOOD BLOOD LEFT HAND  Final   Special Requests   Final    BOTTLES DRAWN AEROBIC AND ANAEROBIC Blood Culture adequate volume   Culture   Final    NO GROWTH 5 DAYS Performed at [facility], [address] [place],  [address]    Report Status 04/13/2022 FINAL  Final    Coagulation Studies: No results for input(s): "LABPROT", "INR" in the last 72 hours.   Urinalysis: No results for input(s): "COLORURINE", "LABSPEC", "PHURINE", "GLUCOSEU", "HGBUR", "BILIRUBINUR", "KETONESUR", "PROTEINUR", "UROBILINOGEN", "NITRITE", "LEUKOCYTESUR" in the last 72 hours.  Invalid input(s): "APPERANCEUR"     Imaging: No results found.   Medications:    sodium chloride Stopped (04/08/22 1515)    amLODipine  10 mg Oral Daily   apixaban  2.5 mg Oral BID   arformoterol  15 mcg Nebulization BID    aspirin EC  81 mg Oral Daily   atorvastatin  10 mg Oral Daily   budesonide (PULMICORT) nebulizer solution  0.5 mg Nebulization BID   carvedilol  25 mg Oral BID WC   insulin aspart  0-9 Units Subcutaneous TID WC   lamoTRIgine  100 mg Oral BID   mouth rinse  15 mL Mouth Rinse 4 times per day   pantoprazole  40 mg Oral Daily   revefenacin  175 mcg Nebulization Daily   tamsulosin  0.4 mg Oral Daily   ziprasidone  160 mg Oral QHS   acetaminophen, docusate sodium, ipratropium-albuterol, midazolam, mouth rinse, polyethylene glycol  Assessment/ Plan:  [name] is a 71 y.o.  male with past medical history including hypertension, diabetes, renal cell carcinoma, with partial nephrectomy, and chronic kidney disease.  Patient presents to the emergency department from his group home with reports of a facial droop.  Patient has been admitted for Cardiac arrest [facility]) [I46.9] Lactic acidosis [E87.20] Aspiration pneumonia of both lower lobes, unspecified aspiration pneumonia type ([place]) [J69.0]   Acute Kidney Injury on chronic kidney disease stage IV with baseline creatinine 3.4 and GFR of 18 on 01/17/22.  Acute kidney injury secondary to IV contrast nephropathy.  IV contrast exposure on 04/08/2022.  Kidney injury also likely due to lack of perfusion from cardiac arrest. Chronic kidney disease is secondary to renal cell carcinoma with partial left nephrectomy.    - Olmesartan and torsemide held.   - Avoid nephrotoxic agents and therapies.   - Creatinine peaked at 4.09 and is stable, 2.96 today.  Urine output of 1300 recorded from yesterday -We will continue to monitor   Lab Results  Component Value Date   CREATININE 2.96 (H) 04/15/2022   CREATININE 2.97 (H) 04/14/2022   CREATININE 3.32 (H) 04/13/2022    Intake/Output Summary (Last 24 hours) at 04/15/2022 1345 Last data filed at 04/15/2022 1035 Gross per 24 hour  Intake 320 ml  Output 550 ml  Net -230 ml    2.  Aspiration  pneumonia.  Placed on 2 L nasal cannula.  3. Diabetes mellitus type II with chronic kidney disease/renal manifestations: insulin dependent. Home regimen includes Humalog 75/25 and NovoLog 30/70. Most recent hemoglobin A1c is 5.7 on 04/09/22.   Primary team to manage sliding scale insulin.  4.  Acute metabolic acidosis.  Serum bicarbonate level stable     LOS: 7   8/20/20231:45 PM

## 2022-04-15 NOTE — Progress Notes (Signed)
Progress Note  Patient Name: Riley Stuart Date of Encounter: 04/15/2022  [facility] Cardiologist: Nelva Bush, MD   Subjective   Talkative this morning, on nasal cannula oxygen Continued mild confusion Concern for EKG changes on telemetry this morning Troponin 165 this morning, down from 716 one week ago Denies significant chest pain or shortness of breath  Inpatient Medications    Scheduled Meds:  amLODipine  10 mg Oral Daily   apixaban  2.5 mg Oral BID   arformoterol  15 mcg Nebulization BID   aspirin EC  81 mg Oral Daily   atorvastatin  10 mg Oral Daily   budesonide (PULMICORT) nebulizer solution  0.5 mg Nebulization BID   carvedilol  25 mg Oral BID WC   insulin aspart  0-9 Units Subcutaneous TID WC   lamoTRIgine  100 mg Oral BID   mouth rinse  15 mL Mouth Rinse 4 times per day   pantoprazole  40 mg Oral Daily   revefenacin  175 mcg Nebulization Daily   tamsulosin  0.4 mg Oral Daily   ziprasidone  160 mg Oral QHS   Continuous Infusions:  sodium chloride Stopped (04/08/22 1515)   PRN Meds: acetaminophen, docusate sodium, ipratropium-albuterol, midazolam, mouth rinse, polyethylene glycol   Vital Signs    Vitals:   04/14/22 2205 04/15/22 0412 04/15/22 0755 04/15/22 0820  BP:  (!) 159/79  (!) 161/81  Pulse:  72 80 81  Resp:  '20 18 18  '$ Temp:  98.1 F (36.7 C)  98.1 F (36.7 C)  TempSrc:  Oral  Oral  SpO2: 95% 95% 96%   Weight:  100.5 kg    Height:        Intake/Output Summary (Last 24 hours) at 04/15/2022 1339 Last data filed at 04/15/2022 1035 Gross per 24 hour  Intake 320 ml  Output 550 ml  Net -230 ml      04/15/2022    4:12 AM 04/14/2022    2:46 AM 04/13/2022    5:00 AM  Last 3 Weights  Weight (lbs) 221 lb 9 oz 220 lb 14.4 oz 220 lb 7.4 oz  Weight (kg) 100.5 kg 100.2 kg 100 kg      Telemetry    Normal sinus rhythm- Personally Reviewed  ECG     - Personally Reviewed  Physical Exam   Constitutional: Alert, no distress.   HENT:  Head: Grossly normal Eyes:  no discharge. No scleral icterus.  Neck: No JVD, no carotid bruits  Cardiovascular: Regular rate and rhythm, no murmurs appreciated Pulmonary/Chest: Clear to auscultation bilaterally, no wheezes or rails Abdominal: Soft.  no distension.  no tenderness.  Musculoskeletal: Normal range of motion Neurological:  normal muscle tone. Coordination normal. No atrophy Skin: Skin warm and dry Psychiatric: Pleasant, mild confusion  Labs    High Sensitivity Troponin:   Recent Labs  Lab 04/08/22 1325 04/08/22 1601 04/08/22 1801 04/09/22 0213 04/15/22 0828  TROPONINIHS 535* 913* 1,131* 716* 165*     Chemistry Recent Labs  Lab 04/09/22 8469 04/10/22 0506 04/11/22 0353 04/12/22 0532 04/13/22 0904 04/14/22 0505 04/15/22 0336  NA 143   < > 141 140 143 142 143  K 3.8   < > 4.1 4.1 4.4 4.2 3.8  CL 113*   < > 113* 112* 116* 118* 117*  CO2 19*   < > 21* 22 18* 20* 23  GLUCOSE 94   < > 118* 134* 143* 99 139*  BUN 37*   < > 51* 51*  53* 51* 50*  CREATININE 3.74*   < > 3.81* 3.48* 3.32* 2.97* 2.96*  CALCIUM 8.5*   < > 8.3* 8.3* 8.7* 8.2* 8.1*  MG 1.8   < > 2.2 2.4 2.7*  --   --   PROT 5.6*  --   --   --   --   --   --   ALBUMIN 2.8*  --   --   --   --   --   --   AST 137*  --   --   --   --   --   --   ALT 67*  --   --   --   --   --   --   ALKPHOS 36*  --   --   --   --   --   --   BILITOT 0.7  --   --   --   --   --   --   GFRNONAA 16*   < > 16* 18* 19* 22* 22*  ANIONGAP 11   < > '7 6 9 '$ 4* 3*   < > = values in this interval not displayed.    Lipids  Recent Labs  Lab 04/09/22 0213  CHOL 86  TRIG 114  110  HDL 26*  LDLCALC 37  CHOLHDL 3.3    Hematology Recent Labs  Lab 04/11/22 0353 04/12/22 0532 04/13/22 0904  WBC 11.3* 10.8* 11.3*  RBC 2.67* 2.64* 2.91*  HGB 8.8* 8.6* 9.6*  HCT 24.5* 24.1* 28.1*  MCV 91.8 91.3 96.6  MCH 33.0 32.6 33.0  MCHC 35.9 35.7 34.2  RDW 16.5* 16.8* 18.4*  PLT 122* 137* 166   Thyroid  Recent Labs  Lab  04/09/22 0213  TSH 0.794    BNP No results for input(s): "BNP", "PROBNP" in the last 168 hours.   DDimer No results for input(s): "DDIMER" in the last 168 hours.   Radiology    No results found.  Cardiac Studies   Echo   1. Left ventricular ejection fraction, by estimation, is 60 to 65%. The  left ventricle has normal function. Left ventricular endocardial border  not optimally defined to evaluate regional wall motion. There is severe  left ventricular hypertrophy. Left  ventricular diastolic parameters are indeterminate.   2. Right ventricular systolic function is normal. The right ventricular  size is normal.   3. Left atrial size was mildly dilated.   4. Right atrial size was mildly dilated.   5. The mitral valve is normal in structure. Trivial mitral valve  regurgitation.   6. The aortic valve has an indeterminant number of cusps. There is mild  calcification of the aortic valve. There is mild thickening of the aortic  valve. Aortic valve regurgitation is not visualized. Aortic valve  sclerosis is present, with no evidence of   aortic valve stenosis.   7. Aortic dilatation noted. There is mild dilatation of the ascending  aorta, measuring 38 mm.   8. The inferior vena cava is normal in size with <50% respiratory  variability, suggesting right atrial pressure of 8 mmHg.   Patient Profile  Riley Stuart is a 71 y.o. male with a hx of chronic kidney disease stage IV, diabetes, hypertension, renal cell carcinoma on oral chemotherapy, schizoaffective disorder, h/o of PE, ruptured cerebral aneurysm with subarachnoid hemorrhage who is being seen 04/09/2022 for the evaluation of out-of-hospital cardiac arrest  Assessment & Plan    Out of hospital cardiac  arrest with unknown etiology;  Elevated troponins in the setting of demand ischemia vs NSTEMI - ACLS time was approximately 50 minutes to obtain ROSC - Hs troponins trended 95, 535, 913, 1131, and 716 Was checked  again this morning down to 165 EKG this morning with left bundle branch block, unchanged from prior EKG Chronic left bundle branch block - Echocardiogram with normal LV function EF 60% poor candidate for cardiac catheterization in the setting of renal failure, mild confusion  Myoview had been held in setting of respiratory distress and mentation. Could consider attempting Myoview at some point in his recovery  Acute hypoxic/ hypercapnic respiratory failure/ Aspiration vs CAP  - extubated last week Developed respiratory distress on the floor Changed to high flow nasal cannula oxygen, back to ICU for further treatment On broad-spectrum antibiotics,  steroids Symptoms improving, now on nasal cannula oxygen, less distress   AKI on CKD IV - serum creatinine slowly improving, creatinine less than 3, likely ATN We will try to avoid contrast loading, like catheterization with possible   Diabetes - A1c 5.7 - continue insulin  5. Acute metabolic encephalopathy with history of schizoaffective disoprder Has had delirium and confusion this admission Possiblely exacerbated by anoxic injury from cardiac arrest   6. H/o pulmonary embolism Has been transitioned from heparin infusion back to eliquis 2.5 mg bid  - CT of the chest negative for PE   7.  Essential hypertension On amlodipine 10, carvedilol 25 twice daily ARB on hold in the setting of renal failure Could consider hydralazine or Imdur, will start Imdur 30 daily to minimize polypharmacy     Total encounter time more than 35 minutes  Greater than 50% was spent in counseling and coordination of care with the patient   For questions or updates, please contact [place] Please consult [email or web address] for contact info under        Signed, Ida Rogue, MD  04/15/2022, 1:39 PM

## 2022-04-16 DIAGNOSIS — I469 Cardiac arrest, cause unspecified: Secondary | ICD-10-CM | POA: Diagnosis not present

## 2022-04-16 LAB — BASIC METABOLIC PANEL
Anion gap: 3 — ABNORMAL LOW (ref 5–15)
BUN: 47 mg/dL — ABNORMAL HIGH (ref 8–23)
CO2: 24 mmol/L (ref 22–32)
Calcium: 8.5 mg/dL — ABNORMAL LOW (ref 8.9–10.3)
Chloride: 117 mmol/L — ABNORMAL HIGH (ref 98–111)
Creatinine, Ser: 3.21 mg/dL — ABNORMAL HIGH (ref 0.61–1.24)
GFR, Estimated: 20 mL/min — ABNORMAL LOW (ref >60)
Glucose, Bld: 129 mg/dL — ABNORMAL HIGH (ref 70–99)
Potassium: 4 mmol/L (ref 3.5–5.1)
Sodium: 144 mmol/L (ref 135–145)

## 2022-04-16 LAB — CBC
HCT: 22.4 % — ABNORMAL LOW (ref 39.0–52.0)
Hemoglobin: 7.6 g/dL — ABNORMAL LOW (ref 13.0–17.0)
MCH: 32.5 pg (ref 26.0–34.0)
MCHC: 33.9 g/dL (ref 30.0–36.0)
MCV: 95.7 fL (ref 80.0–100.0)
Platelets: 223 10*3/uL (ref 150–400)
RBC: 2.34 MIL/uL — ABNORMAL LOW (ref 4.22–5.81)
RDW: 17.6 % — ABNORMAL HIGH (ref 11.5–15.5)
WBC: 9.7 10*3/uL (ref 4.0–10.5)
nRBC: 0.4 % — ABNORMAL HIGH (ref 0.0–0.2)

## 2022-04-16 LAB — GLUCOSE, CAPILLARY
Glucose-Capillary: 139 mg/dL — ABNORMAL HIGH (ref 70–99)
Glucose-Capillary: 137 mg/dL — ABNORMAL HIGH (ref 70–99)
Glucose-Capillary: 136 mg/dL — ABNORMAL HIGH (ref 70–99)

## 2022-04-16 LAB — HEMOGLOBIN: Hemoglobin: 8.6 g/dL — ABNORMAL LOW (ref 13.0–17.0)

## 2022-04-16 LAB — PATHOLOGIST SMEAR REVIEW

## 2022-04-16 MED ORDER — SODIUM CHLORIDE 0.9 % IV SOLN: INTRAVENOUS | Status: DC

## 2022-04-16 MED ORDER — HYDRALAZINE HCL 25 MG PO TABS
25.0000 mg | ORAL_TABLET | Freq: Three times a day (TID) | ORAL | Status: DC
  Administered 2022-04-16 – 2022-04-17 (×4): 25 mg via ORAL
  Filled 2022-04-16 (×4): qty 1

## 2022-04-16 NOTE — Progress Notes (Signed)
Pt has pulled out 2 IV's on my shift.

## 2022-04-16 NOTE — Progress Notes (Signed)
[facility]  ROUNDING NOTE   Subjective:   Riley Stuart is a 71 y.o. male with past medical history including hypertension, diabetes, renal cell carcinoma, with partial nephrectomy, and chronic kidney disease.  Patient presents to the emergency department from his group home with reports of a facial droop.  Patient has been admitted for Cardiac arrest [name]) [I46.9] Lactic acidosis [E87.20] Aspiration pneumonia of both lower lobes, unspecified aspiration pneumonia type [facility]) [J69.0]  Patient known to our practice and is followed outpatient by Dr. Juleen China.  Patient was last seen in the office 01/24/2022 by Dr. Holley Raring for routine follow-up.    Patient resting in bed Maintain good oral intake No lower extremity edema.  Weaned to room air  UOP 1.5L Creatinine 3.21  Objective:  Vital signs in last 24 hours:  Temp:  [98 F (36.7 C)-98.9 F (37.2 C)] 98 F (36.7 C) (08/20 1957) Pulse Rate:  [77-88] 77 (08/21 0428) Resp:  [16-20] 16 (08/21 0428) BP: (144-154)/(77-84) 145/78 (08/21 0428) SpO2:  [93 %-97 %] 97 % (08/21 0428) Weight:  [101.2 kg] 101.2 kg (08/21 0500)  Weight change: 0.7 kg Filed Weights   04/14/22 0246 04/15/22 0412 04/16/22 0500  Weight: 100.2 kg 100.5 kg 101.2 kg    Intake/Output: I/O last 3 completed shifts: In: 23 [P.O.:794] Out: 1500 [Urine:1500]   Intake/Output this shift:  Total I/O In: -  Out: 950 [Urine:950]  Physical Exam: General: NAD  Head: Normocephalic, atraumatic. Moist oral mucosal membranes  Eyes: Anicteric  Lungs:  Clear to auscultation, nonlabored  Heart: Regular rate on monitor, 2/6 murmur  Abdomen:  Nondistedned  Extremities: No peripheral edema.  Neurologic: Able to answer simple questions  Skin: No lesions  Access: None    Basic Metabolic Panel: Recent Labs  Lab 04/10/22 0506 04/11/22 0353 04/12/22 0532 04/13/22 0904 04/14/22 0505 04/15/22 0336 04/16/22 0343  NA 138 141 140 143 142 143 144  K 3.8  4.1 4.1 4.4 4.2 3.8 4.0  CL 108 113* 112* 116* 118* 117* 117*  CO2 19* 21* 22 18* 20* 23 24  GLUCOSE 187* 118* 134* 143* 99 139* 129*  BUN 42* 51* 51* 53* 51* 50* 47*  CREATININE 4.09* 3.81* 3.48* 3.32* 2.97* 2.96* 3.21*  CALCIUM 8.6* 8.3* 8.3* 8.7* 8.2* 8.1* 8.5*  MG 2.1 2.2 2.4 2.7*  --   --   --   PHOS 4.1 4.0 3.7 3.5  --   --   --      Liver Function Tests: No results for input(s): "AST", "ALT", "ALKPHOS", "BILITOT", "PROT", "ALBUMIN" in the last 168 hours.  No results for input(s): "LIPASE", "AMYLASE" in the last 168 hours. No results for input(s): "AMMONIA" in the last 168 hours.  CBC: Recent Labs  Lab 04/10/22 0506 04/11/22 0353 04/12/22 0532 04/13/22 0904 04/16/22 0343 04/16/22 0919  WBC 12.4* 11.3* 10.8* 11.3* 9.7  --   HGB 10.4* 8.8* 8.6* 9.6* 7.6* 8.6*  HCT 29.7* 24.5* 24.1* 28.1* 22.4*  --   MCV 93.1 91.8 91.3 96.6 95.7  --   PLT 141* 122* 137* 166 223  --      Cardiac Enzymes: No results for input(s): "CKTOTAL", "CKMB", "CKMBINDEX", "TROPONINI" in the last 168 hours.  BNP: Invalid input(s): "POCBNP"  CBG: Recent Labs  Lab 04/15/22 0827 04/15/22 1156 04/15/22 1633 04/15/22 2059 04/16/22 1115  GLUCAP 111* 160* 123* 159* 139*     Microbiology: Results for orders placed or performed during the hospital encounter of 04/08/22  Urine  Culture     Status: None   Collection Time: 04/08/22  1:37 PM   Specimen: Urine, Random  Result Value Ref Range Status   Specimen Description   Final    URINE, RANDOM Performed at [facility], [address], [place], [place] [address]    Special Requests   Final    NONE Performed at [facility], [address], [place], [place] [address]    Culture   Final    NO GROWTH Performed at [facility], [place] [address] [place], [place] [address]    Report Status 04/09/2022 FINAL  Final  MRSA Next Gen by PCR, Nasal     Status: None   Collection Time: 04/08/22  3:00 PM   Specimen: Nasal  Mucosa; Nasal Swab  Result Value Ref Range Status   MRSA by PCR Next Gen NOT DETECTED NOT DETECTED Final    Comment: (NOTE) The GeneXpert MRSA Assay (FDA approved for NASAL specimens only), is one component of a comprehensive MRSA colonization surveillance program. It is not intended to diagnose MRSA infection nor to guide or monitor treatment for MRSA infections. Test performance is not FDA approved in patients less than 49 years old. Performed at [facility], [address] [place], [place] [address]   Culture, blood (Routine X 2) w Reflex to ID Panel     Status: None   Collection Time: 04/08/22  6:01 PM   Specimen: BLOOD  Result Value Ref Range Status   Specimen Description BLOOD BLOOD LEFT FOREARM  Final   Special Requests IN PEDIATRIC BOTTLE Blood Culture adequate volume  Final   Culture   Final    NO GROWTH 5 DAYS Performed at [facility], [address], [place], [place] [address]    Report Status 04/13/2022 FINAL  Final  Culture, blood (Routine X 2) w Reflex to ID Panel     Status: None   Collection Time: 04/08/22  6:01 PM   Specimen: BLOOD  Result Value Ref Range Status   Specimen Description BLOOD BLOOD LEFT HAND  Final   Special Requests   Final    BOTTLES DRAWN AEROBIC AND ANAEROBIC Blood Culture adequate volume   Culture   Final    NO GROWTH 5 DAYS Performed at [facility], [address], [place], [place] [address]    Report Status 04/13/2022 FINAL  Final    Coagulation Studies: No results for input(s): "LABPROT", "INR" in the last 72 hours.   Urinalysis: No results for input(s): "COLORURINE", "LABSPEC", "PHURINE", "GLUCOSEU", "HGBUR", "BILIRUBINUR", "KETONESUR", "PROTEINUR", "UROBILINOGEN", "NITRITE", "LEUKOCYTESUR" in the last 72 hours.  Invalid input(s): "APPERANCEUR"     Imaging: No results found.   Medications:    sodium chloride Stopped (04/08/22 1515)   sodium chloride 75 mL/hr at 04/16/22 1352     amLODipine  10 mg Oral Daily   apixaban  2.5 mg Oral BID   arformoterol  15 mcg Nebulization BID   aspirin EC  81 mg Oral Daily   atorvastatin  10 mg Oral Daily   budesonide (PULMICORT) nebulizer solution  0.5 mg Nebulization BID   carvedilol  25 mg Oral BID WC   hydrALAZINE  25 mg Oral Q8H   insulin aspart  0-9 Units Subcutaneous TID WC   isosorbide mononitrate  30 mg Oral Daily   lamoTRIgine  100 mg Oral BID   mouth rinse  15 mL Mouth Rinse 4 times per day   pantoprazole  40  mg Oral Daily   revefenacin  175 mcg Nebulization Daily   tamsulosin  0.4 mg Oral Daily   ziprasidone  160 mg Oral QHS   acetaminophen, docusate sodium, ipratropium-albuterol, midazolam, mouth rinse, polyethylene glycol  Assessment/ Plan:  Riley Stuart is a 71 y.o.  male with past medical history including hypertension, diabetes, renal cell carcinoma, with partial nephrectomy, and chronic kidney disease.  Patient presents to the emergency department from his group home with reports of a facial droop.  Patient has been admitted for Cardiac arrest [facility]) [I46.9] Lactic acidosis [E87.20] Aspiration pneumonia of both lower lobes, unspecified aspiration pneumonia type ([place]) [J69.0]   Acute Kidney Injury on chronic kidney disease stage IV with baseline creatinine 3.4 and GFR of 18 on 01/17/22.  Acute kidney injury secondary to IV contrast nephropathy.  IV contrast exposure on 04/08/2022.  Kidney injury also likely due to lack of perfusion from cardiac arrest. Chronic kidney disease is secondary to renal cell carcinoma with partial left nephrectomy.    - Olmesartan and torsemide held.   - Avoid nephrotoxic agents and therapies.   - Creatinine peaked at 4.09. Elevated today, 3.21. No reason established, No IV contrast, no nausea and vomiting, diuresis. Bladder scan shows 150m with patient voiding 1259mafterwards. Will continue to monitor patient in office 1-2 weeks after discharge.    Lab Results   Component Value Date   CREATININE 3.21 (H) 04/16/2022   CREATININE 2.96 (H) 04/15/2022   CREATININE 2.97 (H) 04/14/2022    Intake/Output Summary (Last 24 hours) at 04/16/2022 1439 Last data filed at 04/16/2022 0900 Gross per 24 hour  Intake 474 ml  Output 2450 ml  Net -1976 ml    2.  Aspiration pneumonia.  Weaned to room air  3. Diabetes mellitus type II with chronic kidney disease/renal manifestations: insulin dependent. Home regimen includes Humalog 75/25 and NovoLog 30/70. Most recent hemoglobin A1c is 5.7 on 04/09/22.     4.  Acute metabolic acidosis.  Resolved with sodium bicarb infusion.      LOS: 8   8/21/20232:39 PM

## 2022-04-16 NOTE — Care Management Important Message (Signed)
Important Message  Patient Details  Name: Riley Stuart MRN: [id number] Date of Birth: 04-25-1951   Medicare Important Message Given:  Yes     Dannette Barbara 04/16/2022, 9:12 AM

## 2022-04-16 NOTE — TOC Progression Note (Signed)
Transition of Care [facility]) - Progression Note    Patient Details  Name: Riley Stuart MRN: [id number] Date of Birth: 10-30-1950  Transition of Care [facility]) CM/SW Contact  Beverly Sessions, RN Phone Number: 04/16/2022, 10:27 AM  Clinical Narrative:      Met with patient at bedside to discuss bed offer Patient declines SNF.   Spoke with [name] at group home and she confirms patient can return if home health is arranged.  Patient in agreement, both him and sharon state they do not have a preference of home health agency  Referral made to [facility] with [name] Patient will require RW at discharge. Awaiting PT to see patient again today to determine if any other DME is needed   Expected Discharge Plan: Group Home Barriers to Discharge: Continued Medical Work up  Expected Discharge Plan and Services Expected Discharge Plan: Group Home     Post Acute Care Choice: Resumption of Svcs/PTA Provider Living arrangements for the past 2 months: Group Home                                       Social Determinants of Health (SDOH) Interventions    Readmission Risk Interventions    04/10/2022   12:00 PM  Readmission Risk Prevention Plan  PCP or Specialist appointment within 3-5 days of discharge Complete  SW Recovery Care/Counseling Consult Complete  [place] Not Applicable

## 2022-04-16 NOTE — Progress Notes (Signed)
Pt pulled PIV out first thing this morning. Still confused and occasionally trying to get out of bed. Won't keep telemetry on. MD aware.

## 2022-04-16 NOTE — Progress Notes (Signed)
Cardiology Progress Note   Patient Name: Riley Stuart Date of Encounter: 04/16/2022  Primary Cardiologist: Nelva Bush, MD  Subjective   Says he has had some pleuritic c/p and chest wall tenderness.  No dyspnea.  Inpatient Medications    Scheduled Meds:  amLODipine  10 mg Oral Daily   apixaban  2.5 mg Oral BID   arformoterol  15 mcg Nebulization BID   aspirin EC  81 mg Oral Daily   atorvastatin  10 mg Oral Daily   budesonide (PULMICORT) nebulizer solution  0.5 mg Nebulization BID   carvedilol  25 mg Oral BID WC   insulin aspart  0-9 Units Subcutaneous TID WC   isosorbide mononitrate  30 mg Oral Daily   lamoTRIgine  100 mg Oral BID   mouth rinse  15 mL Mouth Rinse 4 times per day   pantoprazole  40 mg Oral Daily   revefenacin  175 mcg Nebulization Daily   tamsulosin  0.4 mg Oral Daily   ziprasidone  160 mg Oral QHS   Continuous Infusions:  sodium chloride Stopped (04/08/22 1515)   sodium chloride 100 mL/hr at 04/16/22 1106   PRN Meds: acetaminophen, docusate sodium, ipratropium-albuterol, midazolam, mouth rinse, polyethylene glycol   Vital Signs    Vitals:   04/15/22 1634 04/15/22 1957 04/16/22 0428 04/16/22 0500  BP: (!) 154/84 (!) 144/77 (!) 145/78   Pulse: 88 77 77   Resp: '18 20 16   '$ Temp: 98.9 F (37.2 C) 98 F (36.7 C)    TempSrc: Oral     SpO2: 93% 94% 97%   Weight:    101.2 kg  Height:        Intake/Output Summary (Last 24 hours) at 04/16/2022 1134 Last data filed at 04/16/2022 0900 Gross per 24 hour  Intake 474 ml  Output 2450 ml  Net -1976 ml   Filed Weights   04/14/22 0246 04/15/22 0412 04/16/22 0500  Weight: 100.2 kg 100.5 kg 101.2 kg    Physical Exam   GEN: Well nourished, well developed, in no acute distress.  HEENT: Grossly normal.  Neck: Supple, no JVD, carotid bruits, or masses. Cardiac: RRR, distant, no murmurs, rubs, or gallops. No clubbing, cyanosis, edema.  Radials 2+, DP/PT 2+ and equal bilaterally.  Respiratory:   Respirations regular and unlabored, bibasilar crackles, otw diminished breath sounds throughout. GI: Soft, nontender, nondistended, BS + x 4. MS: no deformity or atrophy. Skin: warm and dry, no rash. Neuro:  Strength and sensation are intact. Psych: AAOx3.  Some confusion - asked me to speak to group home supervisor "across the hall" but then able to tell me that he's in the hospital.  Normal affect.  Labs    Chemistry Recent Labs  Lab 04/14/22 0505 04/15/22 0336 04/16/22 0343  NA 142 143 144  K 4.2 3.8 4.0  CL 118* 117* 117*  CO2 20* 23 24  GLUCOSE 99 139* 129*  BUN 51* 50* 47*  CREATININE 2.97* 2.96* 3.21*  CALCIUM 8.2* 8.1* 8.5*  GFRNONAA 22* 22* 20*  ANIONGAP 4* 3* 3*     Hematology Recent Labs  Lab 04/12/22 0532 04/13/22 0904 04/16/22 0343 04/16/22 0919  WBC 10.8* 11.3* 9.7  --   RBC 2.64* 2.91* 2.34*  --   HGB 8.6* 9.6* 7.6* 8.6*  HCT 24.1* 28.1* 22.4*  --   MCV 91.3 96.6 95.7  --   MCH 32.6 33.0 32.5  --   MCHC 35.7 34.2 33.9  --   RDW  16.8* 18.4* 17.6*  --   PLT 137* 166 223  --     Cardiac Enzymes  Recent Labs  Lab 04/08/22 1325 04/08/22 1601 04/08/22 1801 04/09/22 0213 04/15/22 0828  TROPONINIHS 535* 913* 1,131* 716* 165*      BNP    Component Value Date/Time   BNP 125.4 (H) 04/08/2022 1325   Lipids  Lab Results  Component Value Date   CHOL 86 04/09/2022   HDL 26 (L) 04/09/2022   LDLCALC 37 04/09/2022   TRIG 110 04/09/2022   TRIG 114 04/09/2022   CHOLHDL 3.3 04/09/2022    HbA1c  Lab Results  Component Value Date   HGBA1C 5.7 (H) 04/09/2022    Radiology    No results found.  Telemetry    RSR 8/20 - now off tele - Personally Reviewed  Cardiac Studies   2D Echocardiogram 8.14.2023   1. Left ventricular ejection fraction, by estimation, is 60 to 65%. The  left ventricle has normal function. Left ventricular endocardial border  not optimally defined to evaluate regional wall motion. There is severe  left ventricular  hypertrophy. Left  ventricular diastolic parameters are indeterminate.   2. Right ventricular systolic function is normal. The right ventricular  size is normal.   3. Left atrial size was mildly dilated.   4. Right atrial size was mildly dilated.   5. The mitral valve is normal in structure. Trivial mitral valve  regurgitation.   6. The aortic valve has an indeterminant number of cusps. There is mild  calcification of the aortic valve. There is mild thickening of the aortic  valve. Aortic valve regurgitation is not visualized. Aortic valve  sclerosis is present, with no evidence of   aortic valve stenosis.   7. Aortic dilatation noted. There is mild dilatation of the ascending  aorta, measuring 38 mm.   8. The inferior vena cava is normal in size with <50% respiratory  variability, suggesting right atrial pressure of 8 mmHg.   Patient Profile     71 y.o. male with a hx of chronic kidney disease stage IV, LBBB, diabetes, hypertension, DM, renal cell carcinoma on oral chemotherapy, schizoaffective disorder, h/o of PE, and ruptured cerebral aneurysm with subarachnoid hemorrhage, who presented 8/13 w/ out of hospital cardiac arrest, NSTEMI (95  535  913  1131  716  165), resp failure w/ aspiration vs CAP, and AKI.  Assessment & Plan    1.  Out of hospital cardiac arrest/NSTEMI:  ACLS x 50 mins prior to ROSC.  HsTrop 95  535  913  1131  716  165.  Echo w/ nl EF, sev LVH, and mild AoV sclerosis.  He reports some pleuritic and chest wall pain/tenderness.  Says that he used to have chest pain and dyspnea when doing his workouts at the [name] but can't really say how long ago this occurred.  Discussed pursuit of a lexiscan myoview in the AM. Pt thinks that he won't have any trouble fasting or lying flat for imaging, and wishes to proceed.  Cont asa, statin, ? blocker, nitrate.    2.  Acute hypocic/hypercapnic resp failure/Asp PNA vs CAP:  Initially extubated but developed recurrent resp failure on  floor req readmission to ICU and HFNC.  Abx/steroids per IM w/ improvement.  Currently on room air.  3.  AKI on CKD IV/Contrast induced nephrolpathy:  creat higher this AM @ 3.21.  BUN stable @ 47.  Nephrology following.  Avoiding nephrotoxic agents.  4.  Essential HTN:  Pressures trending 140's to 150's on amlodipine 10 daily and carvedilol 25 bid.  ARB on hold in setting of AKI.  Adding hydralazine 25 tid.  5.  HL:  LDL 37 on lipitor 10.  6.  DMII: A1c 5.7.  On SSI.  7.  Acute metabolic encephalopathy w/ h/o schizoaffective d/o:  Has had delirium and confusion, possibly exacerbated by anoxic brain injury. Overall appears to be stable and cooperative.  8.  H/o PE:  CT chest neg for PE this admission.  Prev completed 6 mos of eliquis 5 bid and has been maintained on 2.5 bid since.  9.  Normocytic anemia:  Hgb improved this AM @ 8.6, up from 7.6 yesterday.  Signed, Murray Hodgkins, NP  04/16/2022, 11:34 AM    For questions or updates, please contact   Please consult [email or web address] for contact info under Cardiology/STEMI.

## 2022-04-16 NOTE — Progress Notes (Signed)
Occupational Therapy Treatment Patient Details Name: Riley Stuart MRN: [id number] DOB: 10/02/1950 Today's Date: 04/16/2022   History of present illness Pt is a 71 y/o M admitted on 04/08/22 after presenting following out-of-hospital cardiac arrest. Pt received a total of 50 minutes of CPR. Pt was intubated on 8/13, extubated on 8/14 & transferred back to ICU on 8/15 & placed on HFNC 2/2 worsening respiratory distress. PMH: ruptured cerebral aneurysm with Ehrenberg 2015, HTN, DM, renal cell carcinoma on chemotherapy, CKD4   OT comments  Pt seen for OT tx this date. Pt endorses feeling better and motivated to return to group home. Pt completed UB bathing and partial LB bathing from a seated unsupported position in recliner. Pt endorses 7/10 chest pain that he attributes to soreness in his chest due to wheezing and talking. RN notified. Pt demonstrates improved tolerance and safety for ADL this session. Continue to recommend SNF at discharge.    Recommendations for follow up therapy are one component of a multi-disciplinary discharge planning process, led by the attending physician.  Recommendations may be updated based on patient status, additional functional criteria and insurance authorization.    Follow Up Recommendations  Skilled nursing-short term rehab (<3 hours/day)    Assistance Recommended at Discharge Frequent or constant Supervision/Assistance  Patient can return home with the following  A little help with walking and/or transfers;A little help with bathing/dressing/bathroom;Assistance with cooking/housework;Assist for transportation;Direct supervision/assist for medications management;Help with stairs or ramp for entrance   Equipment Recommendations  BSC/3in1;Other (comment) (2WW)    Recommendations for Other Services      Precautions / Restrictions Precautions Precautions: Fall Restrictions Weight Bearing Restrictions: No       Mobility Bed Mobility                General bed mobility comments: pt up in recliner at start/end of session    Transfers                   General transfer comment: pt declined     Balance Overall balance assessment: Needs assistance Sitting-balance support: No upper extremity supported, Feet unsupported Sitting balance-Leahy Scale: Good                                     ADL either performed or assessed with clinical judgement   ADL                                         General ADL Comments: Pt seated in recliner without back support to complete sponge bathing. Pt completed UB bathing with set up and supervision. Set up for UB dressing and MIN A for threading arms through sleeves of gown. Pt completed partial LB bathing but declined to complete further.    Extremity/Trunk Assessment              Vision       Perception     Praxis      Cognition Arousal/Alertness: Awake/alert Behavior During Therapy: WFL for tasks assessed/performed Overall Cognitive Status: No family/caregiver present to determine baseline cognitive functioning                                 General Comments: decreased STM, safety awareness and  awareness of deficits        Exercises      Shoulder Instructions       General Comments      Pertinent Vitals/ Pain       Pain Assessment Pain Assessment: 0-10 Pain Score: 7  Pain Location: chest/ribs that he attributes the pain to soreness in his chest due to wheezing and talking Pain Descriptors / Indicators: Aching Pain Intervention(s): Limited activity within patient's tolerance, Monitored during session, Repositioned, Patient requesting pain meds-RN notified  Home Living                                          Prior Functioning/Environment              Frequency  Min 2X/week        Progress Toward Goals  OT Goals(current goals can now be found in the care plan section)  Progress  towards OT goals: Progressing toward goals  Acute Rehab OT Goals Patient Stated Goal: get better and go home OT Goal Formulation: With patient Time For Goal Achievement: 04/27/22 Potential to Achieve Goals: Good  Plan Discharge plan remains appropriate;Frequency remains appropriate    Co-evaluation                 AM-PAC OT "6 Clicks" Daily Activity     Outcome Measure   Help from another person eating meals?: None Help from another person taking care of personal grooming?: None Help from another person toileting, which includes using toliet, bedpan, or urinal?: A Little Help from another person bathing (including washing, rinsing, drying)?: A Little Help from another person to put on and taking off regular upper body clothing?: A Little Help from another person to put on and taking off regular lower body clothing?: A Little 6 Click Score: 20    End of Session    OT Visit Diagnosis: Other abnormalities of gait and mobility (R26.89);Muscle weakness (generalized) (M62.81);Other symptoms and signs involving cognitive function   Activity Tolerance Patient tolerated treatment well   Patient Left in chair;with call bell/phone within reach;with chair alarm set;with nursing/sitter in room   Nurse Communication Patient requests pain meds        Time: 8185-6314 OT Time Calculation (min): 17 min  Charges: OT General Charges $OT Visit: 1 Visit OT Treatments $Self Care/Home Management : 8-22 mins  Ardeth Perfect., MPH, MS, OTR/L ascom [phone] 04/16/22, 4:56 PM

## 2022-04-16 NOTE — Progress Notes (Signed)
Progress Note   Patient: Riley Stuart DOB: Oct 21, 1950 DOA: 04/08/2022     8 DOS: the patient was seen and examined on 04/16/2022   Brief hospital course: Riley Stuart is a 71 year old male with a past medical history significant for ruptured cerebral aneurysm with subarachnoid hemorrhage in 2015, hypertension, diabetes mellitus, renal cell carcinoma on chemotherapy, and chronic kidney disease stage IV who presented to to [facility] ED on 04/08/2022 following out-of-hospital cardiac arrest.  Patient is currently intubated and sedated with no family available, therefore history is obtained from chart review.   Per ED and nursing notes, the patient resides at [facility] where he was found unresponsive in a chair with left sided facial drooping by staff member.  Patient did have pulses at that time.  His brother actually talked to on the phone earlier this morning, and seemed to be in his usually state of health.  Fire Department reported he had thready pulses, of which then he lost his pulse.  EMS arrived at 09:50, initial rhythm was asystole, he received 4 rounds of epinephrine with ROSC after about 12 minutes.  EMS attempted to intubate with [facility] airway, but were unable to due to gagging.  Patient again lost his pulse, initially asystole and then V-fib for which he received 300 mg of amiodarone and 1 defibrillation.  Upon arrival to the ED, remained pulseless (asystole).  He required another several rounds of CPR and epinephrine.  He was successfully intubated by ED provider, who noted copious secretions.  ROSC briefly obtained, but then again lost pulse  Total estimated time of ACLS is approximately 50 minutes, and while in ED received a total of 4 mg of epinephrine, 1g calcium, and 1 amp bicarb.     Following ROSC, he was noted to be gagging on ETT, therefore low dose propofol started. EDP provider did note continued left facial droop and not responding to pain.  08/15  -patient's care was transferred to [facility] and overnight noted to have worsening respiratory distress.  Seen by PCCM and was placed on [place]. Seen during rounds and noted to be tachypneic.  Patient denied any new complaints.  Transferred back to ICU due to respiratory distress and concerns for reintubation.  8/17: weaning down oxygen. Delirious  8/19: o2 weaned to 2 L, delirium resolved  8/21: weaned off oxygen, declines snf     Assessment and Plan: Cardiac arrest [facility]) Patient presented after cardiac arrest with initial rhythm of asystole and subsequent rhythms notable for ventricular tachycardia. Received a total of 50 minutes of CPR. Intubated in the emergency room on 04/08/22 and was extubated on 04/09/22. Transferred back to ICU on 815 due to worsening respiratory distress and is currently on HFNC - PT/OT consulted, advising snf, pt declines, group home agrees to take back  Aspiration pneumonia of both lower lobes ([place])  Cxr 8/15 with partial collapse rll. Cta 8/13 without signs PE Now s/p 7 days abx (zosyn>augmentin) SLP consulted, advising dysphagia 2 diet Now weaned off o2 as of 8/20  NSTEMI (non-ST elevated myocardial infarction) ([place]) Noted to have elevated troponins in the setting of demand ischemia versus non-ST elevation MI. Twelve-lead EKG with chronic left bundle branch block. Patient was on a heparin drip for 48 hours with goal to transition to Eliquis. Not a candidate for left heart cath due to stage IV chronic kidney disease Patient had a 2D echocardiogram with normal LVEF of 60% Seen by cardiology, plans for ischemic work-up on hold since  patient is a poor candidate for left heart cath due to chronic kidney disease Continue aspirin and metoprolol - will confer w/ cardiology as to whether further w/u planned this hospitalization  Schizophrenia Delirium Has underlying schizophrenia, here appears delirious 2/2 above processes. Group home says he is  relatively cogent at baseline. Ct head nothing acute. Group home says he has capacity at baseline to make his medical decisions. Delirium resolved, d/c of steroids likely helped - cont lamictal, geodon  Acute respiratory failure with hypoxia ([place]) Most likely secondary to pneumonia (community acquired versus aspiration) Initially intubated but has been extubated and is currently on high flow nasal cannula 50%/50L. Seen by PCCM for evaluation of respiratory distress and started on systemic steroids and bronchodilator therapy. 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 resume Cabozantinib at d/c  AKI on CKD (chronic kidney disease) stage 4, GFR 15-29 ml/min ([name]) Patient with a history of stage IV chronic kidney disease now worsened and most likely related to contrast patient received for CT angio done on admission to rule out pulmonary embolism Gfr stable in the upper teens, slowly improving with good uop, cr appears to have stabilized at around 3, will give some gentle fluids overnight Nephrology following, ok with discharge, patient follows w/ dr. Holley Raring  Type 2 diabetes mellitus with diabetic chronic kidney disease ([place]) SSI  Thrombocytopenia Anemia Plts now normalized, hgb stable around 9, no report of bleeding - monitor for now  Hx PE Cont eliquis  Hypertension BP remains mildly elevated - imdur added by cardiology  - cont amlodipine    Subjective: feeling well, tolerating diet, ambulated with PT  Physical Exam: Vitals:   04/15/22 1634 04/15/22 1957 04/16/22 0428 04/16/22 0500  BP: (!) 154/84 (!) 144/77 (!) 145/78   Pulse: 88 77 77   Resp: '18 20 16   '$ Temp: 98.9 F (37.2 C) 98 F (36.7 C)    TempSrc: Oral     SpO2: 93% 94% 97%   Weight:     101.2 kg  Height:       General: Elderly gentleman, chronically ill appearing, very talkative HEENT: [place]/AT, moist mucous membranes, sclera anicteric.   Neuro: Moving all extremities CV: rrr, s1s2, no murmurs PULM: Scattered rhonchi and wheezes in all lung fields GI: soft, non-tender, non-distended, BS+ Extremities: warm, no edema Skin: bruising right chest   Data Reviewed: Relevant notes from primary care and specialist visits, past discharge summaries as available in EHR, including Care Everywhere. Prior diagnostic testing as pertinent to current admission diagnoses Updated medications and problem lists for reconciliation ED course, including vitals, labs, imaging, treatment and response to treatment Triage notes, nursing and pharmacy notes and ED provider's notes Notable results as noted in HPI  There are no new results to review at this time.  Family Communication: brother brent updated telephonically 8/20  Disposition: Status is: Inpatient Remains inpatient appropriate because: unsafe d/c plan  Planned Discharge Destination: group home   Author: Desma Maxim, MD 04/16/2022 1:38 PM  For on call review [email or web address].

## 2022-04-16 NOTE — Plan of Care (Signed)
  Problem: Safety: Goal: Ability to remain free from injury will improve Outcome: Not Progressing   Problem: Skin Integrity: Goal: Risk for impaired skin integrity will decrease Outcome: Not Progressing   Problem: Clinical Measurements: Goal: Cardiovascular complication will be avoided Outcome: Not Progressing   Problem: Clinical Measurements: Goal: Cardiovascular complication will be avoided Outcome: Not Progressing   Problem: Clinical Measurements: Goal: Respiratory complications will improve Outcome: Not Progressing

## 2022-04-16 NOTE — Progress Notes (Signed)
Physical Therapy Treatment Patient Details Name: Riley Stuart MRN: [id number] DOB: April 17, 1951 Today's Date: 04/16/2022   History of Present Illness Pt is a 71 y/o M admitted on 04/08/22 after presenting following out-of-hospital cardiac arrest. Pt received a total of 50 minutes of CPR. Pt was intubated on 8/13, extubated on 8/14 & transferred back to ICU on 8/15 & placed on HFNC 2/2 worsening respiratory distress. PMH: ruptured cerebral aneurysm with [place] 2015, HTN, DM, renal cell carcinoma on chemotherapy, CKD4    PT Comments    Patient alert, agreeable to PT up in recliner at start/end of session. He reported chest pain throughout and he stated it was from working out too hard at [employer]. Pt educated on his CPR during his stay here, but did continue to ask for breathing treatments to address his chest pain. He was able to sit <> stand with RW and supervision, and ambulate ~80f with RW and CGA. Pt needed education on safety, RW use/management as well as step by step cueing for therapeutic exercises. Overall he is demonstrating improvement in function, but recommendation currently remains SNF to maximize safety, function, and education. If pt was to return back to group home, would need close supervision for mobility tasks, RW/BSC. RN and MD notified.      Recommendations for follow up therapy are one component of a multi-disciplinary discharge planning process, led by the attending physician.  Recommendations may be updated based on patient status, additional functional criteria and insurance authorization.  Follow Up Recommendations  Skilled nursing-short term rehab (<3 hours/day) Can patient physically be transported by private vehicle: Yes   Assistance Recommended at Discharge Frequent or constant Supervision/Assistance  Patient can return home with the following A lot of help with walking and/or transfers;A lot of help with bathing/dressing/bathroom;Assistance with feeding;Assist  for transportation;Assistance with cooking/housework;Help with stairs or ramp for entrance;Direct supervision/assist for medications management;Direct supervision/assist for financial management   Equipment Recommendations  Rolling walker (2 wheels);BSC/3in1    Recommendations for Other Services       Precautions / Restrictions Precautions Precautions: Fall Restrictions Weight Bearing Restrictions: No     Mobility  Bed Mobility               General bed mobility comments: pt up in recliner at start/end of session    Transfers Overall transfer level: Needs assistance Equipment used: Rolling walker (2 wheels) Transfers: Sit to/from Stand Sit to Stand: Supervision           General transfer comment: cued for hand placement, to decrease BOS (very very wide)    Ambulation/Gait Ambulation/Gait assistance: Min guard Gait Distance (Feet): 60 Feet Assistive device: Rolling walker (2 wheels) Gait Pattern/deviations: Decreased step length - right, Decreased step length - left, Decreased dorsiflexion - right, Decreased dorsiflexion - left, Decreased stride length       General Gait Details: RW outside BOS, unsafe behaviors (lifts RW up, hurried when he turns) reported dizziness/lightheadedness with mobility.   Stairs             Wheelchair Mobility    Modified Rankin (Stroke Patients Only)       Balance Overall balance assessment: Needs assistance Sitting-balance support: No upper extremity supported, Feet unsupported Sitting balance-Leahy Scale: Good     Standing balance support: No upper extremity supported, Single extremity supported, During functional activity Standing balance-Leahy Scale: Fair Standing balance comment: cued for activity pacing and safety, no LOB this session with use of RW  Cognition Arousal/Alertness: Awake/alert Behavior During Therapy: WFL for tasks assessed/performed Overall Cognitive  Status: No family/caregiver present to determine baseline cognitive functioning                                 General Comments: pt oriented to self, location, some situational awareness confusion noted. follows all simple commands        Exercises Other Exercises Other Exercises: LAQ, ankle pumps x20 constant cues to attend to task/maintain form    General Comments        Pertinent Vitals/Pain Pain Assessment Pain Assessment: Faces Faces Pain Scale: Hurts a little bit Pain Location: chest/ribs Pain Descriptors / Indicators: Aching, Guarding, Grimacing    Home Living                          Prior Function            PT Goals (current goals can now be found in the care plan section) Progress towards PT goals: Progressing toward goals    Frequency    Min 2X/week      PT Plan Current plan remains appropriate    Co-evaluation              AM-PAC PT "6 Clicks" Mobility   Outcome Measure  Help needed turning from your back to your side while in a flat bed without using bedrails?: None Help needed moving from lying on your back to sitting on the side of a flat bed without using bedrails?: None Help needed moving to and from a bed to a chair (including a wheelchair)?: A Little Help needed standing up from a chair using your arms (e.g., wheelchair or bedside chair)?: A Little Help needed to walk in hospital room?: A Little Help needed climbing 3-5 steps with a railing? : A Lot 6 Click Score: 19    End of Session Equipment Utilized During Treatment: Gait belt Activity Tolerance: Patient tolerated treatment well Patient left: in chair;with chair alarm set;with call bell/phone within reach (telesitter) Nurse Communication: Mobility status PT Visit Diagnosis: Unsteadiness on feet (R26.81);Muscle weakness (generalized) (M62.81);Difficulty in walking, not elsewhere classified (R26.2)     Time: 4656-8127 PT Time Calculation (min) (ACUTE  ONLY): 13 min  Charges:  $Therapeutic Activity: 8-22 mins                     Lieutenant Diego PT, DPT 1:39 PM,04/16/22

## 2022-04-17 ENCOUNTER — Other Ambulatory Visit: Payer: Self-pay | Admitting: Radiology

## 2022-04-17 ENCOUNTER — Inpatient Hospital Stay (HOSPITAL_COMMUNITY): Payer: Medicare Other

## 2022-04-17 DIAGNOSIS — I469 Cardiac arrest, cause unspecified: Secondary | ICD-10-CM | POA: Diagnosis not present

## 2022-04-17 DIAGNOSIS — R079 Chest pain, unspecified: Secondary | ICD-10-CM | POA: Diagnosis not present

## 2022-04-17 LAB — BASIC METABOLIC PANEL
Anion gap: 9 (ref 5–15)
BUN: 45 mg/dL — ABNORMAL HIGH (ref 8–23)
CO2: 18 mmol/L — ABNORMAL LOW (ref 22–32)
Calcium: 8.2 mg/dL — ABNORMAL LOW (ref 8.9–10.3)
Chloride: 113 mmol/L — ABNORMAL HIGH (ref 98–111)
Creatinine, Ser: 3.27 mg/dL — ABNORMAL HIGH (ref 0.61–1.24)
GFR, Estimated: 19 mL/min — ABNORMAL LOW (ref >60)
Glucose, Bld: 91 mg/dL (ref 70–99)
Potassium: 4.1 mmol/L (ref 3.5–5.1)
Sodium: 140 mmol/L (ref 135–145)

## 2022-04-17 LAB — NM MYOCAR MULTI W/SPECT W/WALL MOTION / EF
Estimated workload: 1
Exercise duration (min): 0 min
Exercise duration (sec): 0 s
LV dias vol: 171 mL (ref 62–150)
LV sys vol: 83 mL
MPHR: 148 {beats}/min
Nuc Stress EF: 51 %
Peak HR: 90 {beats}/min
Percent HR: 60 %
Rest HR: 72 {beats}/min
Rest Nuclear Isotope Dose: 9.6 mCi
SDS: 0
SRS: 16
SSS: 2
Stress Nuclear Isotope Dose: 29.6 mCi
TID: 0.79

## 2022-04-17 LAB — GLUCOSE, CAPILLARY: Glucose-Capillary: 102 mg/dL — ABNORMAL HIGH (ref 70–99)

## 2022-04-17 MED ORDER — ISOSORBIDE MONONITRATE ER 30 MG PO TB24
30.0000 mg | ORAL_TABLET | Freq: Every day | ORAL | 1 refills | Status: AC
Start: 2022-04-18 — End: ?

## 2022-04-17 MED ORDER — AMLODIPINE BESYLATE 10 MG PO TABS
10.0000 mg | ORAL_TABLET | Freq: Every day | ORAL | 1 refills | Status: AC
Start: 2022-04-18 — End: ?

## 2022-04-17 MED ORDER — TECHNETIUM TC 99M TETROFOSMIN IV KIT
9.6200 | PACK | Freq: Once | INTRAVENOUS | Status: AC | PRN
Start: 2022-04-17 — End: 2022-04-17
  Administered 2022-04-17: 9.62 via INTRAVENOUS

## 2022-04-17 MED ORDER — TECHNETIUM TC 99M TETROFOSMIN IV KIT
29.6200 | PACK | Freq: Once | INTRAVENOUS | Status: AC | PRN
  Administered 2022-04-17: 29.62 via INTRAVENOUS

## 2022-04-17 MED ORDER — HYDRALAZINE HCL 25 MG PO TABS
25.0000 mg | ORAL_TABLET | Freq: Three times a day (TID) | ORAL | 1 refills | Status: AC
Start: 2022-04-17 — End: ?

## 2022-04-17 MED ORDER — REGADENOSON 0.4 MG/5ML IV SOLN
0.4000 mg | Freq: Once | INTRAVENOUS | Status: AC
  Administered 2022-04-17: 0.4 mg via INTRAVENOUS

## 2022-04-17 MED ORDER — INSULIN LISPRO PROT & LISPRO (75-25 MIX) 100 UNIT/ML KWIKPEN: 20.0000 [IU] | PEN_INJECTOR | Freq: Every morning | SUBCUTANEOUS | 11 refills | Status: AC

## 2022-04-17 NOTE — Progress Notes (Signed)
Patient is not able to walk the distance required to go the bathroom, or he/she is unable to safely negotiate stairs required to access the bathroom.  A 3in1 BSC will alleviate this problem  

## 2022-04-17 NOTE — Progress Notes (Signed)
[facility]  ROUNDING NOTE   Subjective:   Riley Stuart is a 71 y.o. male with past medical history including hypertension, diabetes, renal cell carcinoma, with partial nephrectomy, and chronic kidney disease.  Patient presents to the emergency department from his group home with reports of a facial droop.  Patient has been admitted for Cardiac arrest [facility]) [I46.9] Lactic acidosis [E87.20] Aspiration pneumonia of both lower lobes, unspecified aspiration pneumonia type [facility]) [J69.0]  Patient known to our practice and is followed outpatient by Dr. Juleen China.  Patient was last seen in the office 01/24/2022 by Dr. Holley Raring for routine follow-up.    Patient resting in bed Alert and oriented Currently NPO for stress test    Objective:  Vital signs in last 24 hours:  Temp:  [97.8 F (36.6 C)-98.2 F (36.8 C)] 98.2 F (36.8 C) (08/22 0314) Pulse Rate:  [53-69] 69 (08/22 0619) Resp:  [16-20] 20 (08/22 0314) BP: (113-144)/(63-79) 144/75 (08/22 0619) SpO2:  [92 %-100 %] 96 % (08/22 0734) Weight:  [102 kg] 102 kg (08/22 0314)  Weight change: 0.8 kg Filed Weights   04/15/22 0412 04/16/22 0500 04/17/22 0314  Weight: 100.5 kg 101.2 kg 102 kg    Intake/Output: I/O last 3 completed shifts: In: 2831 [P.O.:834; I.V.:1000] Out: 3300 [Urine:3300]   Intake/Output this shift:  No intake/output data recorded.  Physical Exam: General: NAD  Head: Normocephalic, atraumatic. Moist oral mucosal membranes  Eyes: Anicteric  Lungs:  Clear to auscultation, nonlabored  Heart: Regular rate on monitor, 2/6 murmur  Abdomen:  Nondistedned  Extremities: No peripheral edema.  Neurologic: Able to answer simple questions  Skin: No lesions  Access: None    Basic Metabolic Panel: Recent Labs  Lab 04/11/22 0353 04/12/22 0532 04/13/22 0904 04/14/22 0505 04/15/22 0336 04/16/22 0343 04/17/22 0759  NA 141 140 143 142 143 144 140  K 4.1 4.1 4.4 4.2 3.8 4.0 4.1  CL 113* 112* 116*  118* 117* 117* 113*  CO2 21* 22 18* 20* 23 24 18*  GLUCOSE 118* 134* 143* 99 139* 129* 91  BUN 51* 51* 53* 51* 50* 47* 45*  CREATININE 3.81* 3.48* 3.32* 2.97* 2.96* 3.21* 3.27*  CALCIUM 8.3* 8.3* 8.7* 8.2* 8.1* 8.5* 8.2*  MG 2.2 2.4 2.7*  --   --   --   --   PHOS 4.0 3.7 3.5  --   --   --   --      Liver Function Tests: No results for input(s): "AST", "ALT", "ALKPHOS", "BILITOT", "PROT", "ALBUMIN" in the last 168 hours.  No results for input(s): "LIPASE", "AMYLASE" in the last 168 hours. No results for input(s): "AMMONIA" in the last 168 hours.  CBC: Recent Labs  Lab 04/11/22 0353 04/12/22 0532 04/13/22 0904 04/16/22 0343 04/16/22 0919  WBC 11.3* 10.8* 11.3* 9.7  --   HGB 8.8* 8.6* 9.6* 7.6* 8.6*  HCT 24.5* 24.1* 28.1* 22.4*  --   MCV 91.8 91.3 96.6 95.7  --   PLT 122* 137* 166 223  --      Cardiac Enzymes: No results for input(s): "CKTOTAL", "CKMB", "CKMBINDEX", "TROPONINI" in the last 168 hours.  BNP: Invalid input(s): "POCBNP"  CBG: Recent Labs  Lab 04/15/22 2059 04/16/22 1115 04/16/22 1558 04/16/22 2102 04/17/22 0749  GLUCAP 159* 139* 137* 136* 102*     Microbiology: Results for orders placed or performed during the hospital encounter of 04/08/22  Urine Culture     Status: None   Collection Time: 04/08/22  1:37  PM   Specimen: Urine, Random  Result Value Ref Range Status   Specimen Description   Final    URINE, RANDOM Performed at [facility], [address], [place], [place] [address]    Special Requests   Final    NONE Performed at [facility], [address] [place], [place] [address]    Culture   Final    NO GROWTH Performed at [facility], [place] [address], [place], [place] [address]    Report Status 04/09/2022 FINAL  Final  MRSA Next Gen by PCR, Nasal     Status: None   Collection Time: 04/08/22  3:00 PM   Specimen: Nasal Mucosa; Nasal Swab  Result Value Ref Range Status   MRSA by PCR Next Gen NOT DETECTED  NOT DETECTED Final    Comment: (NOTE) The GeneXpert MRSA Assay (FDA approved for NASAL specimens only), is one component of a comprehensive MRSA colonization surveillance program. It is not intended to diagnose MRSA infection nor to guide or monitor treatment for MRSA infections. Test performance is not FDA approved in patients less than 54 years old. Performed at [facility], [address] [place], [place] [address]   Culture, blood (Routine X 2) w Reflex to ID Panel     Status: None   Collection Time: 04/08/22  6:01 PM   Specimen: BLOOD  Result Value Ref Range Status   Specimen Description BLOOD BLOOD LEFT FOREARM  Final   Special Requests IN PEDIATRIC BOTTLE Blood Culture adequate volume  Final   Culture   Final    NO GROWTH 5 DAYS Performed at [name], [address] [place], [place] [address]    Report Status 04/13/2022 FINAL  Final  Culture, blood (Routine X 2) w Reflex to ID Panel     Status: None   Collection Time: 04/08/22  6:01 PM   Specimen: BLOOD  Result Value Ref Range Status   Specimen Description BLOOD BLOOD LEFT HAND  Final   Special Requests   Final    BOTTLES DRAWN AEROBIC AND ANAEROBIC Blood Culture adequate volume   Culture   Final    NO GROWTH 5 DAYS Performed at [facility], [address] [place], [place] [address]    Report Status 04/13/2022 FINAL  Final    Coagulation Studies: No results for input(s): "LABPROT", "INR" in the last 72 hours.   Urinalysis: No results for input(s): "COLORURINE", "LABSPEC", "PHURINE", "GLUCOSEU", "HGBUR", "BILIRUBINUR", "KETONESUR", "PROTEINUR", "UROBILINOGEN", "NITRITE", "LEUKOCYTESUR" in the last 72 hours.  Invalid input(s): "APPERANCEUR"     Imaging: No results found.   Medications:    sodium chloride Stopped (04/08/22 1515)   sodium chloride 75 mL/hr at 04/16/22 1352    amLODipine  10 mg Oral Daily   apixaban  2.5 mg Oral BID   arformoterol  15 mcg Nebulization  BID   aspirin EC  81 mg Oral Daily   atorvastatin  10 mg Oral Daily   budesonide (PULMICORT) nebulizer solution  0.5 mg Nebulization BID   carvedilol  25 mg Oral BID WC   hydrALAZINE  25 mg Oral Q8H   insulin aspart  0-9 Units Subcutaneous TID WC   isosorbide mononitrate  30 mg Oral Daily   lamoTRIgine  100 mg Oral BID   mouth rinse  15 mL Mouth Rinse 4 times per day   pantoprazole  40 mg Oral Daily   revefenacin  175 mcg Nebulization Daily   tamsulosin  0.4 mg Oral Daily   ziprasidone  160 mg Oral QHS   acetaminophen, docusate sodium, ipratropium-albuterol, midazolam, mouth rinse, polyethylene glycol  Assessment/ Plan:  [name] is a 71 y.o.  male with past medical history including hypertension, diabetes, renal cell carcinoma, with partial nephrectomy, and chronic kidney disease.  Patient presents to the emergency department from his group home with reports of a facial droop.  Patient has been admitted for Cardiac arrest [name]) [I46.9] Lactic acidosis [E87.20] Aspiration pneumonia of both lower lobes, unspecified aspiration pneumonia type ([place]) [J69.0]   Acute Kidney Injury on chronic kidney disease stage IV with baseline creatinine 3.4 and GFR of 18 on 01/17/22.  Acute kidney injury secondary to IV contrast nephropathy.  IV contrast exposure on 04/08/2022.  Kidney injury also likely due to lack of perfusion from cardiac arrest. Chronic kidney disease is secondary to renal cell carcinoma with partial left nephrectomy.    - Olmesartan and torsemide held.   - Avoid nephrotoxic agents and therapies.   - Creatinine peaked at 4.09.  Slightly elevated again today.  Will continue to monitor patient in office 1-2 weeks after discharge.    Lab Results  Component Value Date   CREATININE 3.27 (H) 04/17/2022   CREATININE 3.21 (H) 04/16/2022   CREATININE 2.96 (H) 04/15/2022    Intake/Output Summary (Last 24 hours) at 04/17/2022 1158 Last data filed at 04/17/2022 0625 Gross  per 24 hour  Intake 1360 ml  Output 1650 ml  Net -290 ml    2.  Aspiration pneumonia.  Antibiotic course completed.  Patient went to room air.  3. Diabetes mellitus type II with chronic kidney disease/renal manifestations: insulin dependent. Home regimen includes Humalog 75/25 and NovoLog 30/70. Most recent hemoglobin A1c is 5.7 on 04/09/22.   Glucose stable.  Sliding scale managed by primary team.  4.  Acute metabolic acidosis.  Decreased to 18 today.  Monitoring.     LOS: 9 [place] 8/22/202311:58 AM

## 2022-04-17 NOTE — Discharge Summary (Signed)
Riley Stuart TIR:[id number] DOB: Apr 14, 1951 DOA: 04/08/2022  PCP: Jodi Marble, MD  Admit date: 04/08/2022 Discharge date: 04/17/2022  Time spent: 40 minutes  Recommendations for Outpatient Follow-up:  Cardiology, nephrology, and pcp f/u     Discharge Diagnoses:  Principal Problem:   Cardiac arrest [facility]) Active Problems:   Cancer of kidney, left ([place])   Type 2 diabetes mellitus with diabetic chronic kidney disease ([place])   CKD (chronic kidney disease) stage 4, GFR 15-29 ml/min ([name])   Renal cell carcinoma ([place])   Acute respiratory failure with hypoxia ([place])   Community acquired pneumonia   NSTEMI (non-ST elevated myocardial infarction) ([place])   Aspiration pneumonia of both lower lobes ([place])   Schizophrenia ([place])   History of pulmonary embolism   Discharge Condition: improved  Diet recommendation: carb modified heart healthy  Filed Weights   04/15/22 0412 04/16/22 0500 04/17/22 0314  Weight: 100.5 kg 101.2 kg 102 kg    History of present illness:  From admission h and p to the ICU Riley Stuart is a 71 year old male with a past medical history significant for ruptured cerebral aneurysm with subarachnoid hemorrhage in 2015, hypertension, diabetes mellitus, renal cell carcinoma on chemotherapy, and chronic kidney disease stage IV who presented to to [name] ED on 04/08/2022 following out-of-hospital cardiac arrest.  Patient is currently intubated and sedated with no family available, therefore history is obtained from chart review.   Per ED and nursing notes, the patient resides at [facility] where he was found unresponsive in a chair with left sided facial drooping by staff member.  Patient did have pulses at that time.  His brother actually talked to on the phone earlier this morning, and seemed to be in his usually state of health.  Fire Department reported he had thready pulses, of which then he lost his pulse.  EMS arrived at 09:50, initial rhythm was  asystole, he received 4 rounds of epinephrine with ROSC after about 12 minutes.  EMS attempted to intubate with [facility] airway, but were unable to due to gagging.  Patient again lost his pulse, initially asystole and then V-fib for which he received 300 mg of amiodarone and 1 defibrillation.  Upon arrival to the ED, remained pulseless (asystole).  He required another several rounds of CPR and epinephrine.  He was successfully intubated by ED provider, who noted copious secretions.  ROSC briefly obtained, but then again lost pulse  Total estimated time of ACLS is approximately 50 minutes, and while in ED received a total of 4 mg of epinephrine, 1g calcium, and 1 amp bicarb.     Following ROSC, he was noted to be gagging on ETT, therefore low dose propofol started. EDP provider did note continued left facial droop and not responding to pain.   Hospital Course:   Cardiac arrest [facility]) Patient presented after cardiac arrest with initial rhythm of asystole and subsequent rhythms notable for ventricular tachycardia. Received a total of 50 minutes of CPR. Intubated in the emergency room on 04/08/22 and was extubated on 04/09/22.   Aspiration pneumonia of both lower lobes ([place])  Cxr 8/15 with partial collapse rll. Cta 8/13 without signs PE Now s/p 7 days abx (zosyn>augmentin) Now weaned off o2 as of 8/20, tolerating diet, remains at some aspiration risk   NSTEMI (non-ST elevated myocardial infarction) ([place]) Noted to have elevated troponins in the setting of demand ischemia versus non-ST elevation MI. Twelve-lead EKG with chronic left bundle branch block. Patient was on a heparin drip  for 48 hours Not a candidate for left heart cath due to stage IV chronic kidney disease Patient had a 2D echocardiogram with normal LVEF of 60% Followed by cardiology Abnormal nuclear stress 8/22 but overall felt to be low risk study F/u cardiology as outpatient   Schizophrenia Delirium Has underlying schizophrenia,  here appears delirious 2/2 above processes. Group home says he is relatively cogent at baseline. Ct head nothing acute. Group home says he has capacity at baseline to make his medical decisions. 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) resume Cabozantinib at d/c   AKI on CKD (chronic kidney disease) stage 4, GFR 15-29 ml/min ([name]) Patient with a history of stage IV chronic kidney disease now worsened and most likely related to contrast patient received for CT angio done on admission to rule out pulmonary embolism Gfr stable in the upper teens to low 72s,  Nephrology following, ok with discharge, patient follows w them   Type 2 diabetes mellitus with diabetic chronic kidney disease ([place])   Thrombocytopenia Anemia Plts now normalized, hgb stable around 9, no report of bleeding   Hx PE Cont eliquis   Hypertension BP remains mildly elevated - imdur added by cardiology  - cont amlodipine - ARB on hold, nephrology may resume at f/u  Procedures: intubation  Consultations: Cardiology, nephrology  Discharge Exam: Vitals:   04/17/22 0734 04/17/22 1300  BP:  136/63  Pulse:    Resp:  20  Temp:  97.6 F (36.4 C)  SpO2: 96% 97%    General: Elderly gentleman, chronically ill appearing, very talkative HEENT: [place]/AT, moist mucous membranes, sclera anicteric.   Neuro: Moving all extremities CV: rrr, s1s2, no murmurs PULM: Scattered rhonchi and wheezes in all lung fields GI: soft, non-tender, non-distended, BS+ Extremities: warm, no edema Skin: bruising right chest  Discharge  Instructions   Discharge Instructions     Diet - low sodium heart healthy   Complete by: As directed    Increase activity slowly   Complete by: As directed       Allergies as of 04/17/2022   No Known Allergies      Medication List     STOP taking these medications    amLODipine-valsartan 10-320 MG tablet Commonly known as: EXFORGE   minoxidil 2.5 MG tablet Commonly known as: LONITEN   NovoLOG Mix 70/30 FlexPen (70-30) 100 UNIT/ML FlexPen Generic drug: insulin aspart protamine - aspart   olmesartan 40 MG tablet Commonly known as: BENICAR   torsemide 20 MG tablet Commonly known as: DEMADEX       TAKE these medications    amLODipine 10 MG tablet Commonly known as: NORVASC Take 1 tablet (10 mg total) by mouth daily. Start taking on: April 18, 2022   atorvastatin 10 MG tablet Commonly known as: LIPITOR Take 10 mg by mouth daily.   Cabometyx 40 MG tablet Generic drug: cabozantinib Take 1 tablet (40 mg total) by mouth daily. Take on an empty stomach, 1 hour before or 2 hours after meals.   calcitRIOL 0.25 MCG capsule Commonly known as: ROCALTROL Take 0.25 mcg by mouth daily.   calcium carbonate 600 MG  Tabs tablet Commonly known as: OS-CAL Take 2 tablets (1,200 mg total) by mouth 2 (two) times daily with a meal.   carvedilol 25 MG tablet Commonly known as: COREG Take 25 mg by mouth 2 (two) times daily.   Durezol 0.05 % Emul Generic drug: Difluprednate Place 1 drop into the right eye 2 (two) times daily.   Eliquis 2.5 MG Tabs tablet Generic drug: apixaban TAKE 1 TABLET BY MOUTH TWICE A DAY   fenofibrate 145 MG tablet Commonly known as: TRICOR Take 145 mg by mouth daily.   hydrALAZINE 25 MG tablet Commonly known as: APRESOLINE Take 1 tablet (25 mg total) by mouth every 8 (eight) hours.   Ingrezza 40 MG capsule Generic drug: valbenazine Take 40 mg by mouth daily.   Insulin Lispro Prot & Lispro (75-25) 100 UNIT/ML Kwikpen Commonly known as:  HUMALOG 75/25 MIX Inject 20 Units into the skin every morning. What changed: how much to take   isosorbide mononitrate 30 MG 24 hr tablet Commonly known as: IMDUR Take 1 tablet (30 mg total) by mouth daily. Start taking on: April 18, 2022   lamoTRIgine 100 MG tablet Commonly known as: LAMICTAL Take 100 mg by mouth 2 (two) times daily.   loperamide 2 MG tablet Commonly known as: Imodium A-D Take 1 tablet (2 mg total) by mouth See admin instructions. Take '4mg'$  with the onset of diarrhea, then take '2mg'$  after every loose bowel movements. Maximum '16mg'$  per 24 hours.   omeprazole 40 MG capsule Commonly known as: PRILOSEC Take 40 mg by mouth daily.   ondansetron 8 MG tablet Commonly known as: ZOFRAN TAKE 1 TABLET BY MOUTH EVERY 8 HOURS AS NEEDED FOR NAUSEA OR VOMITING   tamsulosin 0.4 MG Caps capsule Commonly known as: FLOMAX Take 0.4 mg by mouth daily.   UltiCare Pen Needles 29G X 12.7MM Misc Generic drug: Insulin Pen Needle   ziprasidone 80 MG capsule Commonly known as: GEODON Take 160 mg by mouth at bedtime.               Durable Medical Equipment  (From admission, onward)           Start     Ordered   04/16/22 1331  For home use only DME Walker rolling  Once       Question Answer Comment  Walker: With [place] Wheels   Patient needs a walker to treat with the following condition Weakness generalized      04/16/22 1330   04/16/22 1331  For home use only DME 3 n 1  Once        04/16/22 1330   04/16/22 1028  For home use only DME Walker rolling  Once       Question Answer Comment  Walker: With [place]   Patient needs a walker to treat with the following condition Weakness      04/16/22 1027           No Known Allergies  Follow-up Information     Jodi Marble, MD Follow up.   Specialty: Internal Medicine Contact information: [place] [address] [phone]         Nelva Bush, MD .   Specialty:  Cardiology Contact information: [address] [place] [address] [phone]         Lavonia Dana, MD Follow up.   Specialty: Nephrology Contact information: [address] [place] [address] [phone]  The results of significant diagnostics from this hospitalization (including imaging, microbiology, ancillary and laboratory) are listed below for reference.    Significant Diagnostic Studies: NM Myocar Multi W/Spect W/Wall Motion / EF  Result Date: 04/17/2022   Abnormal, probably low risk pharmacologic myocardial perfusion stress test.   There is a large in size, mild in severity, fixed defect involving the anteroseptal and anterior walls most consistent with artifact (LBBB and attenuation) and less likely scar.   No significant ischemia is observed.   Left ventricular systolic function is low-normal (LVEF 50-55%).   Coronary artery calcification is noted on the attenuation correct CT.  Small bilateral pleural effusions are also present.   US RENAL  Result Date: 04/10/2022 CLINICAL DATA:  Acute renal failure. History of partial left nephrectomy for renal carcinoma. EXAM: RENAL / URINARY TRACT ULTRASOUND COMPLETE COMPARISON:  11/24/2018 FINDINGS: Right Kidney: Renal measurements: 12.6 x 5.4 x 6.2 cm = volume: 219 mL. Increased cortical echogenicity. No hydronephrosis. Simple cysts identified with the largest measuring just under 4 cm. Left Kidney: Renal measurements: 9.8 x 7.3 x 7.8 cm = volume: 291 mL. Increased cortical echogenicity. No hydronephrosis. Multiple simple cysts identified with the largest measuring approximately 3.4 cm. Bladder: Appears normal for degree of bladder distention. Other: None. IMPRESSION: Multiple renal cysts bilaterally. Both kidneys demonstrate increased cortical echogenicity without evidence of hydronephrosis. Electronically Signed   By: Aletta Edouard M.D.   On: 04/10/2022 17:53   DG  Chest Port 1 View  Result Date: 04/10/2022 CLINICAL DATA:  Worsening difficulty breathing EXAM: PORTABLE CHEST 1 VIEW COMPARISON:  04/09/2022 FINDINGS: Cardiomegaly with left ventricular prominence as seen previously. Aortic atherosclerosis. Pulmonary venous hypertension without frank edema. Partial collapse of the right lower lobe persist. Minimal patchy atelectasis at the left base. No worsened finding. IMPRESSION: Similar radiographic appearance. Partial collapse of the right lower lobe. Minimal patchy atelectasis at the left base. Electronically Signed   By: Nelson Chimes M.D.   On: 04/10/2022 07:43   DG Chest Port 1 View  Result Date: 04/09/2022 CLINICAL DATA:  Cardiac arrest. EXAM: PORTABLE CHEST 1 VIEW COMPARISON:  Chest x-ray 04/08/2022 FINDINGS: Endotracheal tube and enteric tube have been removed. The cardiac silhouette is within normal limits. Mediastinal silhouette appears stable. Right mid and lower lung airspace disease has decreased, but has not completely resolved. Costophrenic angles are clear. No pneumothorax. Left-sided rib fractures are again noted. IMPRESSION: 1. Endotracheal and enteric tubes have been removed. 2. Decreasing right lung airspace disease. Electronically Signed   By: Ronney Asters M.D.   On: 04/09/2022 17:07   ECHOCARDIOGRAM COMPLETE  Result Date: 04/09/2022    ECHOCARDIOGRAM REPORT   Patient Name:   Riley Stuart Date of Exam: 04/09/2022 Medical Rec #:  [id number]             Height:       72.0 in Accession #:    [id number]            Weight:       224.2 lb Date of Birth:  08/30/1950             BSA:          2.237 m Patient Age:    14 years              BP:           92/64 mmHg Patient Gender: M  HR:           85 bpm. Exam Location:  [facility] Procedure: 2D Echo, Cardiac Doppler and Color Doppler Indications:     I46.9 Cardiac arrest  History:         Patient has no prior history of Echocardiogram examinations.                  Renal cell carcinoma;  Risk Factors:Diabetes, Current Smoker and                  Dyslipidemia.  Sonographer:     Rosalia Hammers Referring Phys:  [id number] Bradly Bienenstock Diagnosing Phys: Nelva Bush MD  Sonographer Comments: Technically difficult study due to poor echo windows. Image acquisition challenging due to respiratory motion. IMPRESSIONS  1. Left ventricular ejection fraction, by estimation, is 60 to 65%. The left ventricle has normal function. Left ventricular endocardial border not optimally defined to evaluate regional wall motion. There is severe left ventricular hypertrophy. Left ventricular diastolic parameters are indeterminate.  2. Right ventricular systolic function is normal. The right ventricular size is normal.  3. Left atrial size was mildly dilated.  4. Right atrial size was mildly dilated.  5. The mitral valve is normal in structure. Trivial mitral valve regurgitation.  6. The aortic valve has an indeterminant number of cusps. There is mild calcification of the aortic valve. There is mild thickening of the aortic valve. Aortic valve regurgitation is not visualized. Aortic valve sclerosis is present, with no evidence of  aortic valve stenosis.  7. Aortic dilatation noted. There is mild dilatation of the ascending aorta, measuring 38 mm.  8. The inferior vena cava is normal in size with <50% respiratory variability, suggesting right atrial pressure of 8 mmHg. FINDINGS  Left Ventricle: Left ventricular ejection fraction, by estimation, is 60 to 65%. The left ventricle has normal function. Left ventricular endocardial border not optimally defined to evaluate regional wall motion. The left ventricular internal cavity size was normal in size. There is severe left ventricular hypertrophy. Abnormal (paradoxical) septal motion, consistent with left bundle branch block. Left ventricular diastolic parameters are indeterminate. Right Ventricle: The right ventricular size is normal. No increase in right ventricular wall  thickness. Right ventricular systolic function is normal. Left Atrium: Left atrial size was mildly dilated. Right Atrium: Right atrial size was mildly dilated. Pericardium: Trivial pericardial effusion is present. Mitral Valve: The mitral valve is normal in structure. Trivial mitral valve regurgitation. Tricuspid Valve: The tricuspid valve is normal in structure. Tricuspid valve regurgitation is trivial. Aortic Valve: The aortic valve has an indeterminant number of cusps. There is mild calcification of the aortic valve. There is mild thickening of the aortic valve. Aortic valve regurgitation is not visualized. Aortic valve sclerosis is present, with no evidence of aortic valve stenosis. Aortic valve mean gradient measures 7.0 mmHg. Aortic valve peak gradient measures 11.0 mmHg. Aortic valve area, by VTI measures 1.80 cm. Pulmonic Valve: The pulmonic valve was normal in structure. Pulmonic valve regurgitation is trivial. Aorta: Aortic dilatation noted. There is mild dilatation of the ascending aorta, measuring 38 mm. Venous: The inferior vena cava is normal in size with less than 50% respiratory variability, suggesting right atrial pressure of 8 mmHg. IAS/Shunts: The interatrial septum was not well visualized.  LEFT VENTRICLE PLAX 2D LVIDd:         3.48 cm LVIDs:         2.10 cm LV PW:  1.72 cm LV IVS:        1.72 cm LVOT diam:     1.70 cm LV SV:         49 LV SV Index:   22 LVOT Area:     2.27 cm  RIGHT VENTRICLE RV Basal diam:  4.50 cm RV S prime:     19.10 cm/s TAPSE (M-mode): 3.3 cm LEFT ATRIUM           Index        RIGHT ATRIUM           Index LA diam:      3.30 cm 1.48 cm/m   RA Area:     21.80 cm LA Vol (A2C): 40.7 ml 18.20 ml/m  RA Volume:   69.80 ml  31.21 ml/m LA Vol (A4C): 67.7 ml 30.27 ml/m  AORTIC VALVE AV Area (Vmax):    1.93 cm AV Area (Vmean):   1.77 cm AV Area (VTI):     1.80 cm AV Vmax:           166.00 cm/s AV Vmean:          120.000 cm/s AV VTI:            0.271 m AV Peak Grad:       11.0 mmHg AV Mean Grad:      7.0 mmHg LVOT Vmax:         141.00 cm/s LVOT Vmean:        93.600 cm/s LVOT VTI:          0.215 m LVOT/AV VTI ratio: 0.79  AORTA Ao Root diam: 3.20 cm MITRAL VALVE MV Area (PHT): 4.86 cm     SHUNTS MV Decel Time: 156 msec     Systemic VTI:  0.22 m MV E velocity: 140.00 cm/s  Systemic Diam: 1.70 cm Nelva Bush MD Electronically signed by Nelva Bush MD Signature Date/Time: 04/09/2022/1:47:18 PM    Final    CT Angio Chest PE W and/or Wo Contrast  Result Date: 04/08/2022 CLINICAL DATA:  Pulmonary embolism (PE) suspected, high prob. CPR. History of renal cell carcinoma EXAM: CT ANGIOGRAPHY CHEST WITH CONTRAST TECHNIQUE: Multidetector CT imaging of the chest was performed using the standard protocol during bolus administration of intravenous contrast. Multiplanar CT image reconstructions and MIPs were obtained to evaluate the vascular anatomy. RADIATION DOSE REDUCTION: This exam was performed according to the departmental dose-optimization program which includes automated exposure control, adjustment of the mA and/or kV according to patient size and/or use of iterative reconstruction technique. CONTRAST:  44m OMNIPAQUE IOHEXOL 350 MG/ML SOLN COMPARISON:  Same day CT chest 1143 hours, 02/19/2022 FINDINGS: Cardiovascular: Good contrast bolus timing with satisfactory opacification of the central pulmonary arteries. The segmental and subsegmental branch pulmonary arteries are suboptimally evaluated secondary to respiratory motion artifact. Within this limitation, there is no evidence of pulmonary embolism to the lobar branch level. Thoracic aorta is nonaneurysmal. Scattered atherosclerotic vascular calcifications of the aorta and coronary arteries. Heart size is within normal limits. Trace pericardial fluid. Mediastinum/Nodes: No axillary, mediastinal, or hilar lymphadenopathy. Endotracheal and enteric tubes remain appropriately positioned. Lungs/Pleura: Moderate emphysema. Slight  worsening of dependent airspace consolidation within the right upper and right lower lobes. Similar degree of dependent opacity in the left lower lobe. Areas of nodular and band-like scarring in the periphery of the left upper lobe and right middle lobes are unchanged. No pneumothorax. No significant pleural effusion. Upper Abdomen: Rounded low-density lesions within the liver, not appreciably  changed from 02/19/2022. No acute abnormality within the upper abdomen. Musculoskeletal: Redemonstrated bilateral rib fractures, the left fourth and fifth ribs being mildly displaced. Remaining fractures are otherwise nondisplaced. No subcutaneous emphysema. No chest wall hematoma. Nondisplaced sternal body fracture. Thoracic vertebral body heights are maintained. Review of the MIP images confirms the above findings. IMPRESSION: 1. No evidence of pulmonary embolism to the lobar branch level. 2. Slight worsening of dependent airspace consolidation within the right upper and right lower lobes. 3. Otherwise, no significant interval change in the appearance of the chest compared to recent previous exam. 4. Redemonstrated bilateral rib fractures.  No pneumothorax. Aortic Atherosclerosis (ICD10-I70.0) and Emphysema (ICD10-J43.9). Electronically Signed   By: Davina Poke D.O.   On: 04/08/2022 13:26   CT Chest Wo Contrast  Result Date: 04/08/2022 CLINICAL DATA:  71 year old male found unresponsive, CPR in progress. Left facial droop. History of left renal cell carcinoma and partial nephrectomy. EXAM: CT CHEST WITHOUT CONTRAST TECHNIQUE: Multidetector CT imaging of the chest was performed following the standard protocol without IV contrast. RADIATION DOSE REDUCTION: This exam was performed according to the departmental dose-optimization program which includes automated exposure control, adjustment of the mA and/or kV according to patient size and/or use of iterative reconstruction technique. COMPARISON:  Cervical spine CT  today. Restaging noncontrast Chest CT 02/19/2022. FINDINGS: Cardiovascular: Calcified coronary artery atherosclerosis. Calcified aortic atherosclerosis. Mild cardiomegaly appears stable. No pericardial effusion. Vascular patency is not evaluated in the absence of IV contrast. Mediastinum/Nodes: Gas distended proximal thoracic esophagus containing enteric tube which continues to the abdomen. The mid and distal thoracic esophagus are decompressed. No mediastinal mass or lymphadenopathy identified. Lungs/Pleura: Intubated. ETT tip in good position above the carina. Major airways remain patent. Dependent right upper and lower lung consolidation is extensive. Underlying emphysema. Chronic bilateral middle lobe opacity/scarring along major fissures on series 3, image 84 is unchanged. Trace if any layering pleural fluid. No pneumothorax. Upper Abdomen: Enteric tube continues into the body of the stomach. No free air or free fluid in the visible upper abdomen. Stable visible noncontrast liver, spleen, adrenal glands and right kidney. Musculoskeletal: Left side 2 through 7 anterolateral rib fractures, some mildly displaced (series 3, image 59). Right side 2 through 7 anterior rib fractures, several at the costochondral junctions. Nondisplaced mid sternal fracture (series 5, image 32). Trace overlying subcutaneous hematoma or contusion. Thoracic vertebrae appear stable. No suspicious osseous lesion. IMPRESSION: 1. Extensive dependent lung consolidation right > left. Consider aspiration, pneumonia. Trace if any layering pleural fluid. Underlying Emphysema (ICD10-J43.9). 2. CPR related bilateral 2 through 7 rib fractures, central sternal fracture. 3. ETT and visible enteric tube in good position. 4. Calcified coronary artery, Aortic Atherosclerosis (ICD10-I70.0). Stable mild cardiomegaly. Electronically Signed   By: Genevie Ann M.D.   On: 04/08/2022 12:07   CT Cervical Spine Wo Contrast  Result Date: 04/08/2022 CLINICAL DATA:   71 year old male found unresponsive, CPR in progress. Left facial droop. EXAM: CT CERVICAL SPINE WITHOUT CONTRAST TECHNIQUE: Multidetector CT imaging of the cervical spine was performed without intravenous contrast. Multiplanar CT image reconstructions were also generated. RADIATION DOSE REDUCTION: This exam was performed according to the departmental dose-optimization program which includes automated exposure control, adjustment of the mA and/or kV according to patient size and/or use of iterative reconstruction technique. COMPARISON:  Head CT today, 10/26/2013. FINDINGS: Alignment: Preserved cervical lordosis. Cervicothoracic junction alignment is within normal limits. Bilateral posterior element alignment is within normal limits. Skull base and vertebrae: Visualized skull base is intact. No atlanto-occipital  dissociation. C1 and C2 appear intact and aligned. An oval sclerotic focus in the right clivus has been present since 2015, benign. No acute osseous abnormality identified. Soft tissues and spinal canal: Visible right nasoenteric and endotracheal tubes course appropriately into the airway and esophagus. Disc levels: Mild for age cervical spine degeneration, mostly right side facet arthropathy. Upper chest: Partially visible dependent consolidation in the right lung. Mildly gas distended proximal thoracic esophagus. Other: Absent dentition. IMPRESSION: 1. No acute traumatic injury identified in the cervical spine. 2. Satisfactory course of visible right nasoenteric and endotracheal tubes. 3. Partially visible dependent consolidation in the right lung. Electronically Signed   By: Genevie Ann M.D.   On: 04/08/2022 12:01   CT HEAD WO CONTRAST (5MM)  Result Date: 04/08/2022 CLINICAL DATA:  71 year old male found unresponsive, CPR in progress. Left facial droop. EXAM: CT HEAD WITHOUT CONTRAST TECHNIQUE: Contiguous axial images were obtained from the base of the skull through the vertex without intravenous contrast.  RADIATION DOSE REDUCTION: This exam was performed according to the departmental dose-optimization program which includes automated exposure control, adjustment of the mA and/or kV according to patient size and/or use of iterative reconstruction technique. COMPARISON:  Head CT 10/26/2013. FINDINGS: Brain: Bilateral inferior frontal gyrus encephalomalacia, interval left frontotemporal craniotomy. No midline shift, ventriculomegaly, mass effect, evidence of mass lesion, intracranial hemorrhage or evidence of cortically based acute infarction. Outside of the anterior frontal lobes gray-white matter differentiation is within normal limits. Vascular: Anterior communicating artery region aneurysm clip is new. No suspicious intracranial vascular hyperdensity. Skull: Left frontotemporal craniotomy since 2015. No acute osseous abnormality identified. Sinuses/Orbits: Maxillary alveolar recess mucosal thickening. Other Visualized paranasal sinuses and mastoids are stable and well aerated. Other: No acute orbit or scalp soft tissue finding identified. Right nasoenteric tube in place. Endotracheal tube partially visible in the pharynx. IMPRESSION: 1. No acute intracranial abnormality. 2. 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 Associated inferior frontal gyrus encephalomalacia. Electronically Signed   By: Genevie Ann M.D.   On: 04/08/2022 11:59   DG Chest Portable 1 View  Result Date: 04/08/2022 CLINICAL DATA:  Shortness of breath. Status post CPR and intubation. EXAM: PORTABLE CHEST 1 VIEW COMPARISON:  05/12/2021 radiograph and 02/19/2022 CT FINDINGS: Endotracheal tube with tip 5 cm above the carina and enteric tube entering the stomach with tip off the field of view noted. Defibrillator/pacing pads overlying the LOWER chest noted. Diffuse airspace opacities throughout the RIGHT lung noted. Interstitial/mild patchy opacities within the LEFT lung noted. There is no evidence of pleural  effusion or pneumothorax. Several radiopaque structures overlying the chest are likely external to the patient but correlate clinically. There are fractures of the LEFT 4th through 7th ribs. IMPRESSION: 1. Fractures of the LEFT 4th through 7th ribs. No evidence of pneumothorax. 2. Diffuse RIGHT lung airspace opacities and mild LEFT lung patchy opacities - question asymmetric edema, infection or aspiration. 3. Endotracheal tube with tip 5 cm above the carina and enteric tube entering the stomach with tip off the field of view. Electronically Signed   By: Margarette Canada M.D.   On: 04/08/2022 11:30    Microbiology: Recent Results (from the past 240 hour(s))  Urine Culture     Status: None   Collection Time: 04/08/22  1:37 PM   Specimen: Urine, Random  Result Value Ref Range Status   Specimen Description   Final    URINE, RANDOM Performed at [facility], [address] [place], [place]  [address]    Special Requests   Final    NONE Performed at [facility], [address] [place], [place] [address]    Culture   Final    NO GROWTH Performed at [facility], [place] [address], [place], [place] [address]    Report Status 04/09/2022 FINAL  Final  MRSA Next Gen by PCR, Nasal     Status: None   Collection Time: 04/08/22  3:00 PM   Specimen: Nasal Mucosa; Nasal Swab  Result Value Ref Range Status   MRSA by PCR Next Gen NOT DETECTED NOT DETECTED Final    Comment: (NOTE) The GeneXpert MRSA Assay (FDA approved for NASAL specimens only), is one component of a comprehensive MRSA colonization surveillance program. It is not intended to diagnose MRSA infection nor to guide or monitor treatment for MRSA infections. Test performance is not FDA approved in patients less than 34 years old. Performed at [facility], [address] [place], [place] [address]   Culture, blood (Routine X 2) w Reflex to ID Panel     Status: None   Collection Time: 04/08/22  6:01 PM    Specimen: BLOOD  Result Value Ref Range Status   Specimen Description BLOOD BLOOD LEFT FOREARM  Final   Special Requests IN PEDIATRIC BOTTLE Blood Culture adequate volume  Final   Culture   Final    NO GROWTH 5 DAYS Performed at [facility], [address] [place], [place] [address]    Report Status 04/13/2022 FINAL  Final  Culture, blood (Routine X 2) w Reflex to ID Panel     Status: None   Collection Time: 04/08/22  6:01 PM   Specimen: BLOOD  Result Value Ref Range Status   Specimen Description BLOOD BLOOD LEFT HAND  Final   Special Requests   Final    BOTTLES DRAWN AEROBIC AND ANAEROBIC Blood Culture adequate volume   Culture   Final    NO GROWTH 5 DAYS Performed at [name], [address] [place], [place] [address]    Report Status 04/13/2022 FINAL  Final     Labs: Basic Metabolic Panel: Recent Labs  Lab 04/11/22 0353 04/12/22 0532 04/13/22 0904 04/14/22 0505 04/15/22 0336 04/16/22 0343 04/17/22 0759  NA 141 140 143 142 143 144 140  K 4.1 4.1 4.4 4.2 3.8 4.0 4.1  CL 113* 112* 116* 118* 117* 117* 113*  CO2 21* 22 18* 20* 23 24 18*  GLUCOSE 118* 134* 143* 99 139* 129* 91  BUN 51* 51* 53* 51* 50* 47* 45*  CREATININE 3.81* 3.48* 3.32* 2.97* 2.96* 3.21* 3.27*  CALCIUM 8.3* 8.3* 8.7* 8.2* 8.1* 8.5* 8.2*  MG 2.2 2.4 2.7*  --   --   --   --   PHOS 4.0 3.7 3.5  --   --   --   --    Liver Function Tests: No results for input(s): "AST", "ALT", "ALKPHOS", "BILITOT", "PROT", "ALBUMIN" in the last 168 hours. No results for input(s): "LIPASE", "AMYLASE" in the last 168 hours. No results for input(s): "AMMONIA" in the last 168 hours. CBC: Recent Labs  Lab 04/11/22 0353 04/12/22 0532 04/13/22 0904 04/16/22 0343 04/16/22 0919  WBC 11.3* 10.8* 11.3* 9.7  --   HGB 8.8* 8.6* 9.6* 7.6* 8.6*  HCT 24.5* 24.1* 28.1* 22.4*  --   MCV 91.8 91.3 96.6 95.7  --   PLT 122* 137* 166 223  --    Cardiac Enzymes: No  results for input(s): "CKTOTAL", "CKMB",  "CKMBINDEX", "TROPONINI" in the last 168 hours. BNP: BNP (last 3 results) Recent Labs    04/08/22 1325  BNP 125.4*    ProBNP (last 3 results) No results for input(s): "PROBNP" in the last 8760 hours.  CBG: Recent Labs  Lab 04/15/22 2059 04/16/22 1115 04/16/22 1558 04/16/22 2102 04/17/22 0749  GLUCAP 159* 139* 137* 136* 102*       Signed:  Desma Maxim MD.  [facility] 04/17/2022, 3:20 PM

## 2022-04-17 NOTE — Progress Notes (Signed)
Cardiology Progress Note   Patient Name: Riley Stuart Date of Encounter: 04/17/2022  Primary Cardiologist: Nelva Bush, MD  Subjective   No chest pain or dyspnea.  For lexiscan myoview.  Inpatient Medications    Scheduled Meds:  amLODipine  10 mg Oral Daily   apixaban  2.5 mg Oral BID   arformoterol  15 mcg Nebulization BID   aspirin EC  81 mg Oral Daily   atorvastatin  10 mg Oral Daily   budesonide (PULMICORT) nebulizer solution  0.5 mg Nebulization BID   carvedilol  25 mg Oral BID WC   hydrALAZINE  25 mg Oral Q8H   insulin aspart  0-9 Units Subcutaneous TID WC   isosorbide mononitrate  30 mg Oral Daily   lamoTRIgine  100 mg Oral BID   mouth rinse  15 mL Mouth Rinse 4 times per day   pantoprazole  40 mg Oral Daily   revefenacin  175 mcg Nebulization Daily   tamsulosin  0.4 mg Oral Daily   ziprasidone  160 mg Oral QHS   Continuous Infusions:  sodium chloride Stopped (04/08/22 1515)   sodium chloride 75 mL/hr at 04/16/22 1352   PRN Meds: acetaminophen, docusate sodium, ipratropium-albuterol, midazolam, mouth rinse, polyethylene glycol   Vital Signs    Vitals:   04/16/22 2232 04/17/22 0314 04/17/22 0619 04/17/22 0734  BP: 131/75 (!) 144/79 (!) 144/75   Pulse: 64 67 69   Resp:  20    Temp:  98.2 F (36.8 C)    TempSrc:  Oral    SpO2:  94%  96%  Weight:  102 kg    Height:        Intake/Output Summary (Last 24 hours) at 04/17/2022 1248 Last data filed at 04/17/2022 3818 Gross per 24 hour  Intake 1360 ml  Output 1650 ml  Net -290 ml   Filed Weights   04/15/22 0412 04/16/22 0500 04/17/22 0314  Weight: 100.5 kg 101.2 kg 102 kg    Physical Exam   GEN: Well nourished, well developed, in no acute distress.  HEENT: Grossly normal.  Neck: Supple, no JVD, carotid bruits, or masses. Cardiac: RRR, distantno murmurs, rubs, or gallops. No clubbing, cyanosis, edema.  Radials 2+, DP/PT 2+ and equal bilaterally.  Respiratory:  Respirations regular and  unlabored, diminished GI: Soft, nontender, nondistended, BS + x 4. MS: no deformity or atrophy. Skin: warm and dry, no rash. Neuro:  Strength and sensation are intact. Psych: AAOx3.  Normal affect.  Labs    Chemistry Recent Labs  Lab 04/15/22 0336 04/16/22 0343 04/17/22 0759  NA 143 144 140  K 3.8 4.0 4.1  CL 117* 117* 113*  CO2 23 24 18*  GLUCOSE 139* 129* 91  BUN 50* 47* 45*  CREATININE 2.96* 3.21* 3.27*  CALCIUM 8.1* 8.5* 8.2*  GFRNONAA 22* 20* 19*  ANIONGAP 3* 3* 9     Hematology Recent Labs  Lab 04/12/22 0532 04/13/22 0904 04/16/22 0343 04/16/22 0919  WBC 10.8* 11.3* 9.7  --   RBC 2.64* 2.91* 2.34*  --   HGB 8.6* 9.6* 7.6* 8.6*  HCT 24.1* 28.1* 22.4*  --   MCV 91.3 96.6 95.7  --   MCH 32.6 33.0 32.5  --   MCHC 35.7 34.2 33.9  --   RDW 16.8* 18.4* 17.6*  --   PLT 137* 166 223  --     Cardiac Enzymes  Recent Labs  Lab 04/08/22 1325 04/08/22 1601 04/08/22 1801 04/09/22 2993 04/15/22 7169  TROPONINIHS 535* 913* 1,131* 716* 165*      BNP    Component Value Date/Time   BNP 125.4 (H) 04/08/2022 1325    Lipids  Lab Results  Component Value Date   CHOL 86 04/09/2022   HDL 26 (L) 04/09/2022   LDLCALC 37 04/09/2022   TRIG 110 04/09/2022   TRIG 114 04/09/2022   CHOLHDL 3.3 04/09/2022    HbA1c  Lab Results  Component Value Date   HGBA1C 5.7 (H) 04/09/2022    Radiology    No results found.  Telemetry    Non-tele  Cardiac Studies   2D Echocardiogram 8.14.2023    1. Left ventricular ejection fraction, by estimation, is 60 to 65%. The  left ventricle has normal function. Left ventricular endocardial border  not optimally defined to evaluate regional wall motion. There is severe  left ventricular hypertrophy. Left  ventricular diastolic parameters are indeterminate.   2. Right ventricular systolic function is normal. The right ventricular  size is normal.   3. Left atrial size was mildly dilated.   4. Right atrial size was mildly  dilated.   5. The mitral valve is normal in structure. Trivial mitral valve  regurgitation.   6. The aortic valve has an indeterminant number of cusps. There is mild  calcification of the aortic valve. There is mild thickening of the aortic  valve. Aortic valve regurgitation is not visualized. Aortic valve  sclerosis is present, with no evidence of   aortic valve stenosis.   7. Aortic dilatation noted. There is mild dilatation of the ascending  aorta, measuring 38 mm.   8. The inferior vena cava is normal in size with <50% respiratory  variability, suggesting right atrial pressure of 8 mmHg.   Patient Profile     71 y.o. male with a hx of chronic kidney disease stage IV, LBBB, diabetes, hypertension, DM, renal cell carcinoma on oral chemotherapy, schizoaffective disorder, h/o of PE, and ruptured cerebral aneurysm with subarachnoid hemorrhage, who presented 8/13 w/ out of hospital cardiac arrest, NSTEMI (95  535  913  1131  716  165), resp failure w/ aspiration vs CAP, and AKI.  Assessment & Plan    1.  Out of hospital cardiac arrest/NSTEMI:  ACLS x 50 mins prior to ROSC.  HsTrop 95  535  913  1131  716  165.  Echo w/ nl EF, sev LVH, and mild AoV sclerosis.  Was having some pleuritic c/p but denies this AM.  For lexiscan myoview today.  Cont asa, statin, ? blockern, and nitrate.  2.  Acute hypoxic/hypercapnic resp failure/Aspiration vs community acquired PNA:  Prev req intubation and then HFNC.  On room air and doing well.  Nebs per IM.  3.  AKI on CKD IV/CIN:  BUN/Creat leveling off - 45/3.27.  Nephrology following.  4.  Essential HTN:  Pressures overall improved after adding hydralazine yesterday.  Now trending 130's to 140's.  Nsg held coreg this AM b/c he's NPO for stress.  Follow BPs on current meds.  5.  HL:  LDL 37 on lipitor 10.  6.  DMII:  A1c 5.7.  SSI.  7.  Acute metabolic encephalopathy w/ h/o schizoaffective d/o:  Currently stable.  8.  H/o PE:  Chest CTA neg for PE this  admission.  Prev completed 6 mos of eliquis 5 bid and is now on 2.5 bid - managed by hematology.  9.  Normocytic anemia:  Hgb better @ 8.6 yesterday.  Follow.  Signed, Harrell Gave  Sharolyn Douglas, NP  04/17/2022, 12:48 PM    For questions or updates, please contact   Please consult [email or web address] for contact info under Cardiology/STEMI.

## 2022-04-17 NOTE — TOC Progression Note (Signed)
Transition of Care [facility]) - Progression Note    Patient Details  Name: Riley Stuart MRN: [id number] Date of Birth: Oct 14, 1950  Transition of [facility]) CM/SW Contact  Beverly Sessions, RN Phone Number: 04/17/2022, 9:20 AM  Clinical Narrative:     Confirmed with Stacha at [facility] that if patient is discharged today they can provide transportation  Referral for RW and [name] made to [facility] with adapt for DME to be delivered to room  Expected Discharge Plan: Group Home Barriers to Discharge: Continued Medical Work up  Expected Discharge Plan and Services Expected Discharge Plan: Group Home     Post Acute Care Choice: Resumption of Svcs/PTA Provider Living arrangements for the past 2 months: Group Home                                       Social Determinants of Health (SDOH) Interventions    Readmission Risk Interventions    04/10/2022   12:00 PM  Readmission Risk Prevention Plan  PCP or Specialist appointment within 3-5 days of discharge Complete  SW Recovery Care/Counseling Consult Complete  [place] Not Applicable

## 2022-04-17 NOTE — TOC Transition Note (Signed)
Transition of Care [facility]) - CM/SW Discharge Note   Patient Details  Name: Riley Stuart MRN: [id number] Date of Birth: [birth year]  Transition of Care [facility]) CM/SW Contact:  Beverly Sessions, RN Phone Number: 04/17/2022, 3:46 PM   Clinical Narrative:    Patient to dc today BSC and RW delivered to room Stasha at [name] to transport [place] with [name] notified of discharge and they are able to accept the referral,      Barriers to Discharge: Continued Medical Work up   Patient Goals and CMS Choice        Discharge Placement                       Discharge Plan and Services     Post Acute Care Choice: Resumption of Svcs/PTA Provider                               Social Determinants of Health (SDOH) Interventions     Readmission Risk Interventions    04/10/2022   12:00 PM  Readmission Risk Prevention Plan  PCP or Specialist appointment within 3-5 days of discharge Complete  SW Recovery Care/Counseling Consult Complete  [place] Not Applicable

## 2022-04-17 NOTE — NC FL2 (Signed)
[place] LEVEL OF CARE SCREENING TOOL     IDENTIFICATION  Patient Name: Riley Stuart Birthdate: 10/25/1950 Sex: male Admission Date (Current Location): 04/08/2022  [place] and [place] Number:  Engineering geologist and Address:  [facility], [address], [place], [place] [address]      Provider Number: [id number]  Attending Physician Name and Address:  Gwynne Edinger, MD  Relative Name and Phone Number:  Shearon Stalls [phone]   [phone] Group Home Coordinatior    Current Level of Care: Hospital Recommended Level of Care: Other (Comment) (group home) Prior Approval Number:    Date Approved/Denied:   PASRR Number: pending  Discharge Plan: Other (Comment) (group home)    Current Diagnoses: Patient Active Problem List   Diagnosis Date Noted   Schizophrenia ([place]) 04/11/2022   History of pulmonary embolism 04/11/2022   Cardiac arrest ([place]) 04/08/2022   Acute respiratory failure with hypoxia ([place]) 04/08/2022   Community acquired pneumonia 04/08/2022   NSTEMI (non-ST elevated myocardial infarction) ([place]) 04/08/2022   Aspiration pneumonia of both lower lobes ([place])    Callus 08/14/2021   Plantar flexed metatarsal bone of left foot 08/14/2021   Renal cell carcinoma ([place]) 05/18/2021   Pulmonary infarct ([place]) 05/12/2021   Anemia due to stage 4 chronic kidney disease ([place]) 05/12/2021   Anemia in chronic kidney disease 02/08/2021   Encounter for antineoplastic chemotherapy 01/18/2021   CKD (chronic kidney disease) stage 4, GFR 15-29 ml/min ([name]) 01/18/2021   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[place]) 01/11/2021   Pain due to onychomycosis of toenails of both feet 11/21/2020   Goals of care, counseling/discussion    Polyp of colon    Cancer of kidney, left ([place]) 08/10/2019   Benign hypertensive kidney disease with chronic kidney disease 06/08/2019   Proteinuria 06/08/2019   Renal mass 06/08/2019    Stage 3 chronic kidney disease ([place]) 06/08/2019   Type 2 diabetes mellitus with diabetic chronic kidney disease ([place]) 06/08/2019   Low back pain 01/05/2014   Leg length discrepancy left 01/05/2014   Cerebral aneurysm rupture ([place]) 11/06/2013   SAH (subarachnoid hemorrhage) ([place]) 10/26/2013    Orientation RESPIRATION BLADDER Height & Weight     Self, Situation, Place  Normal Incontinent Weight: 102 kg Height:  6' (182.9 cm)  BEHAVIORAL SYMPTOMS/MOOD NEUROLOGICAL BOWEL NUTRITION STATUS      Continent Diet (Carb modifed)  AMBULATORY STATUS COMMUNICATION OF NEEDS Skin   Limited Assist Verbally Skin abrasions, Bruising                       Personal Care Assistance Level of Assistance  Bathing, Feeding, Dressing Bathing Assistance: Limited assistance Feeding assistance: Limited assistance Dressing Assistance: Limited assistance     Functional Limitations Info             SPECIAL CARE FACTORS FREQUENCY  PT (By licensed PT), OT (By licensed OT)     PT Frequency: [facility]oration OT Frequency: [facility]oration            Contractures Contractures Info: Not present    Additional Factors Info  Code Status, Allergies Code Status Info: Full Allergies Info: NKDA           Medication List       STOP taking these medications     amLODipine-valsartan 10-320 MG tablet Commonly known as: EXFORGE    minoxidil 2.5 MG tablet Commonly known as: LONITEN    NovoLOG Mix 70/30 FlexPen (  70-30) 100 UNIT/ML FlexPen Generic drug: insulin aspart protamine - aspart    olmesartan 40 MG tablet Commonly known as: BENICAR    torsemide 20 MG tablet Commonly known as: DEMADEX           TAKE these medications     amLODipine 10 MG tablet Commonly known as: NORVASC Take 1 tablet (10 mg total) by mouth daily. Start taking on: April 18, 2022    atorvastatin 10 MG tablet Commonly known as: LIPITOR Take 10 mg by mouth daily.    Cabometyx 40 MG tablet Generic drug:  cabozantinib Take 1 tablet (40 mg total) by mouth daily. Take on an empty stomach, 1 hour before or 2 hours after meals.    calcitRIOL 0.25 MCG capsule Commonly known as: ROCALTROL Take 0.25 mcg by mouth daily.    calcium carbonate 600 MG Tabs tablet Commonly known as: OS-CAL Take 2 tablets (1,200 mg total) by mouth 2 (two) times daily with a meal.    carvedilol 25 MG tablet Commonly known as: COREG Take 25 mg by mouth 2 (two) times daily.    Durezol 0.05 % Emul Generic drug: Difluprednate Place 1 drop into the right eye 2 (two) times daily.    Eliquis 2.5 MG Tabs tablet Generic drug: apixaban TAKE 1 TABLET BY MOUTH TWICE A DAY    fenofibrate 145 MG tablet Commonly known as: TRICOR Take 145 mg by mouth daily.    hydrALAZINE 25 MG tablet Commonly known as: APRESOLINE Take 1 tablet (25 mg total) by mouth every 8 (eight) hours.    Ingrezza 40 MG capsule Generic drug: valbenazine Take 40 mg by mouth daily.    Insulin Lispro Prot & Lispro (75-25) 100 UNIT/ML Kwikpen Commonly known as: HUMALOG 75/25 MIX Inject 20 Units into the skin every morning. What changed: how much to take    isosorbide mononitrate 30 MG 24 hr tablet Commonly known as: IMDUR Take 1 tablet (30 mg total) by mouth daily. Start taking on: April 18, 2022    lamoTRIgine 100 MG tablet Commonly known as: LAMICTAL Take 100 mg by mouth 2 (two) times daily.    loperamide 2 MG tablet Commonly known as: Imodium A-D Take 1 tablet (2 mg total) by mouth See admin instructions. Take '4mg'$  with the onset of diarrhea, then take '2mg'$  after every loose bowel movements. Maximum '16mg'$  per 24 hours.    omeprazole 40 MG capsule Commonly known as: PRILOSEC Take 40 mg by mouth daily.    ondansetron 8 MG tablet Commonly known as: ZOFRAN TAKE 1 TABLET BY MOUTH EVERY 8 HOURS AS NEEDED FOR NAUSEA OR VOMITING    tamsulosin 0.4 MG Caps capsule Commonly known as: FLOMAX Take 0.4 mg by mouth daily.    UltiCare Pen Needles  29G X 12.7MM Misc Generic drug: Insulin Pen Needle    ziprasidone 80 MG capsule Commonly known as: GEODON Take 160 mg by mouth at bedtime.     Relevant Imaging Results:  Relevant Lab Results:   Additional Information SS #: [place]  Beverly Sessions, RN

## 2022-04-17 NOTE — Progress Notes (Signed)
Riley Stuart to be Riley [facility] Home per MD order.  Discussed prescriptions and follow up appointments with the patient. Prescriptions given to patient, medication list explained in detail. Pt verbalized understanding.  Allergies as of 04/17/2022   No Known Allergies      Medication List     STOP taking these medications    amLODipine-valsartan 10-320 MG tablet Commonly known as: EXFORGE   minoxidil 2.5 MG tablet Commonly known as: LONITEN   NovoLOG Mix 70/30 FlexPen (70-30) 100 UNIT/ML FlexPen Generic drug: insulin aspart protamine - aspart   olmesartan 40 MG tablet Commonly known as: BENICAR   torsemide 20 MG tablet Commonly known as: DEMADEX       TAKE these medications    amLODipine 10 MG tablet Commonly known as: NORVASC Take 1 tablet (10 mg total) by mouth daily. Start taking on: April 18, 2022   atorvastatin 10 MG tablet Commonly known as: LIPITOR Take 10 mg by mouth daily.   Cabometyx 40 MG tablet Generic drug: cabozantinib Take 1 tablet (40 mg total) by mouth daily. Take on an empty stomach, 1 hour before or 2 hours after meals.   calcitRIOL 0.25 MCG capsule Commonly known as: ROCALTROL Take 0.25 mcg by mouth daily.   calcium carbonate 600 MG Tabs tablet Commonly known as: OS-CAL Take 2 tablets (1,200 mg total) by mouth 2 (two) times daily with a meal.   carvedilol 25 MG tablet Commonly known as: COREG Take 25 mg by mouth 2 (two) times daily.   Durezol 0.05 % Emul Generic drug: Difluprednate Place 1 drop into the right eye 2 (two) times daily.   Eliquis 2.5 MG Tabs tablet Generic drug: apixaban TAKE 1 TABLET BY MOUTH TWICE A DAY   fenofibrate 145 MG tablet Commonly known as: TRICOR Take 145 mg by mouth daily.   hydrALAZINE 25 MG tablet Commonly known as: APRESOLINE Take 1 tablet (25 mg total) by mouth every 8 (eight) hours.   Ingrezza 40 MG capsule Generic drug: valbenazine Take 40 mg by mouth daily.   Insulin Lispro Prot &  Lispro (75-25) 100 UNIT/ML Kwikpen Commonly known as: HUMALOG 75/25 MIX Inject 20 Units into the skin every morning. What changed: how much to take   isosorbide mononitrate 30 MG 24 hr tablet Commonly known as: IMDUR Take 1 tablet (30 mg total) by mouth daily. Start taking on: April 18, 2022   lamoTRIgine 100 MG tablet Commonly known as: LAMICTAL Take 100 mg by mouth 2 (two) times daily.   loperamide 2 MG tablet Commonly known as: Imodium A-D Take 1 tablet (2 mg total) by mouth See admin instructions. Take '4mg'$  with the onset of diarrhea, then take '2mg'$  after every loose bowel movements. Maximum '16mg'$  per 24 hours.   omeprazole 40 MG capsule Commonly known as: PRILOSEC Take 40 mg by mouth daily.   ondansetron 8 MG tablet Commonly known as: ZOFRAN TAKE 1 TABLET BY MOUTH EVERY 8 HOURS AS NEEDED FOR NAUSEA OR VOMITING   tamsulosin 0.4 MG Caps capsule Commonly known as: FLOMAX Take 0.4 mg by mouth daily.   UltiCare Pen Needles 29G X 12.7MM Misc Generic drug: Insulin Pen Needle   ziprasidone 80 MG capsule Commonly known as: GEODON Take 160 mg by mouth at bedtime.               Durable Medical Equipment  (From admission, onward)           Start     Ordered   04/16/22 1331  For home use only DME Walker rolling  Once       Question Answer Comment  Walker: With Kings   Patient needs a walker to treat with the following condition Weakness generalized      04/16/22 1330   04/16/22 1331  For home use only DME 3 n 1  Once        04/16/22 1330   04/16/22 1028  For home use only DME Walker rolling  Once       Question Answer Comment  Walker: With 5 Inch Wheels   Patient needs a walker to treat with the following condition Weakness      04/16/22 1027            Vitals:   04/17/22 0734 04/17/22 1300  BP:  136/63  Pulse:    Resp:  20  Temp:  97.6 F (36.4 Stuart)  SpO2: 96% 97%    Skin clean, dry and intact without evidence of skin break down, no  evidence of skin tears noted. IV catheter discontinued intact. Site without signs and symptoms of complications. Dressing and pressure applied. Pt denies pain at this time. No complaints noted.  An After Visit Summary was printed and given to the patient. Patient escorted via [place], and Riley Stuart home via private auto.  Riley Stuart. Riley Stuart

## 2022-04-23 DIAGNOSIS — R404 Transient alteration of awareness: Secondary | ICD-10-CM | POA: Diagnosis not present

## 2022-04-23 DIAGNOSIS — I469 Cardiac arrest, cause unspecified: Secondary | ICD-10-CM | POA: Diagnosis not present

## 2022-04-23 DIAGNOSIS — Z743 Need for continuous supervision: Secondary | ICD-10-CM | POA: Diagnosis not present

## 2022-04-23 DIAGNOSIS — I499 Cardiac arrhythmia, unspecified: Secondary | ICD-10-CM | POA: Diagnosis not present

## 2022-04-23 DIAGNOSIS — W19XXXA Unspecified fall, initial encounter: Secondary | ICD-10-CM | POA: Diagnosis not present

## 2022-04-24 ENCOUNTER — Other Ambulatory Visit (HOSPITAL_COMMUNITY): Payer: Self-pay

## 2022-04-27 DEATH — deceased

## 2022-05-07 ENCOUNTER — Ambulatory Visit: Payer: Medicare Other | Admitting: Podiatry

## 2022-05-09 LAB — BLOOD GAS, ARTERIAL
Acid-base deficit: 12.6 mmol/L — ABNORMAL HIGH (ref 0.0–2.0)
Bicarbonate: 17.4 mmol/L — ABNORMAL LOW (ref 20.0–28.0)
FIO2: 50 %
MECHVT: 550 mL
Mechanical Rate: 16
O2 Saturation: 89.6 %
PEEP: 10 cmH2O
Patient temperature: 37
RATE: 16 resp/min
pCO2 arterial: 56 mmHg — ABNORMAL HIGH (ref 32–48)
pH, Arterial: 7.1 — CL (ref 7.35–7.45)
pO2, Arterial: 69 mmHg — ABNORMAL LOW (ref 83–108)

## 2022-05-11 ENCOUNTER — Ambulatory Visit: Payer: Medicare Other | Admitting: Oncology

## 2022-05-11 ENCOUNTER — Other Ambulatory Visit: Payer: Medicare Other

## 2022-05-29 ENCOUNTER — Ambulatory Visit: Payer: Medicare Other | Admitting: Physician Assistant

## 2023-07-26 ENCOUNTER — Other Ambulatory Visit: Payer: Self-pay

## 2023-08-10 IMAGING — CT CT CHEST-ABD-PELV W/O CM
2 of 4 series · 13 of 36 positions shown, 15 images · non-contrast
Comparison: CT chest abdomen pelvis, 12/29/2020, MR abdomen,
10/10/2020

CLINICAL DATA: Renal cell carcinoma restaging, status post partial
left nephrectomy with suspected local recurrence and metastatic
disease

EXAM:
CT CHEST, ABDOMEN AND PELVIS WITHOUT CONTRAST
TECHNIQUE: Multidetector CT imaging of the chest, abdomen and pelvis was
performed following the standard protocol without IV contrast.

[Series 2: axial st · axial · 0.95mm/px · z∈[-953,-403]mm · 10 of 132 slices shown, 12 images]
[im 11/132  mediastinal]
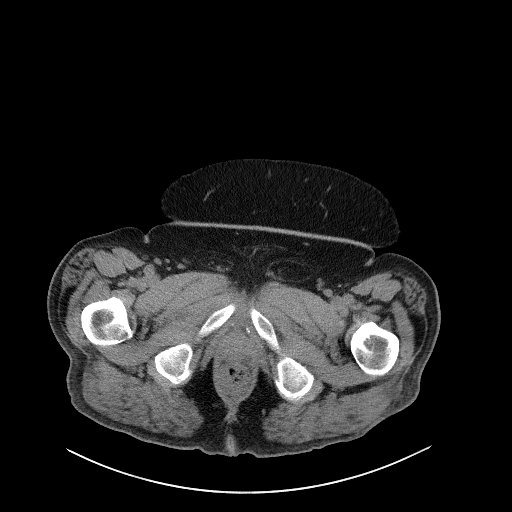
[im 11/132  bone]
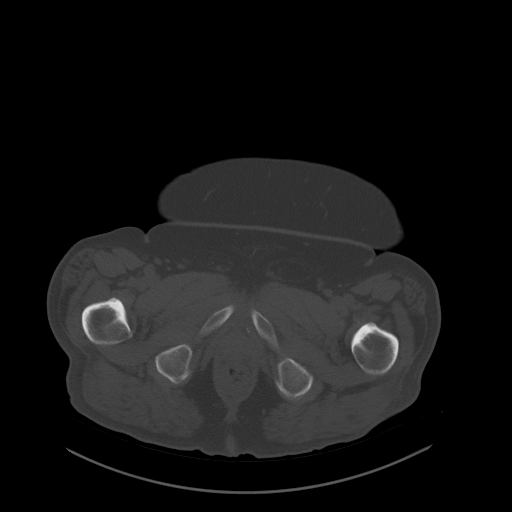
[im 22/132  mediastinal]
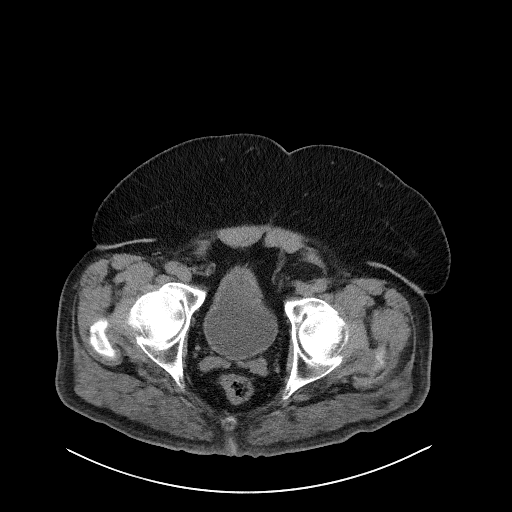
[im 33/132  mediastinal]
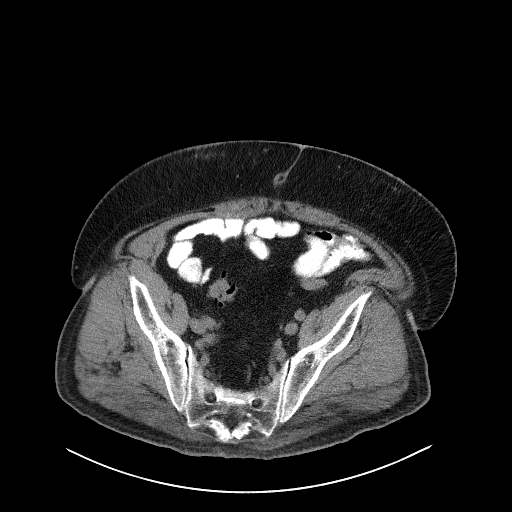
[im 44/132  mediastinal]
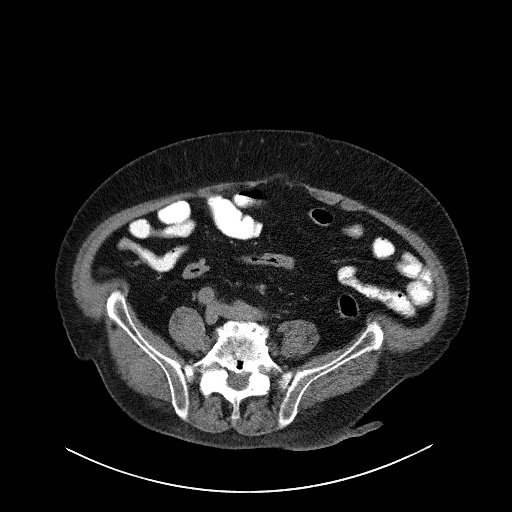
[im 55/132  mediastinal]
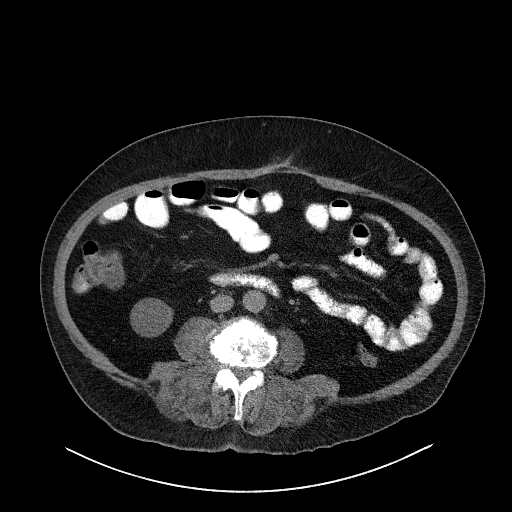
[im 77/132  mediastinal]
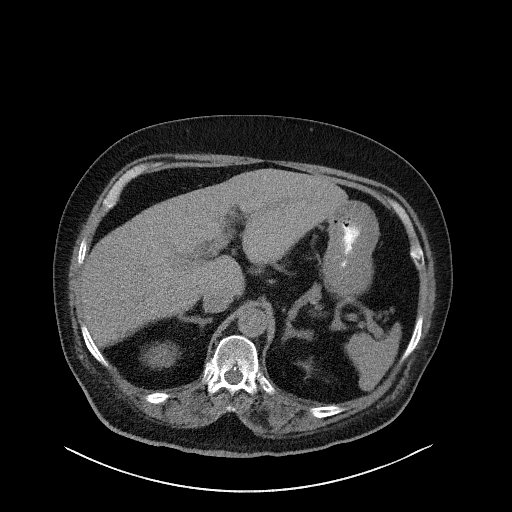
[im 88/132  mediastinal]
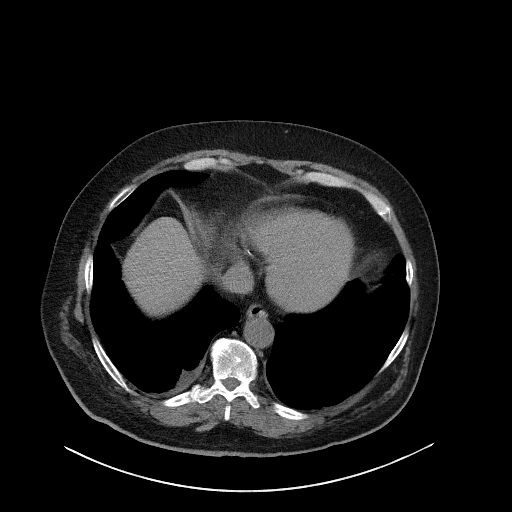
[im 99/132  mediastinal]
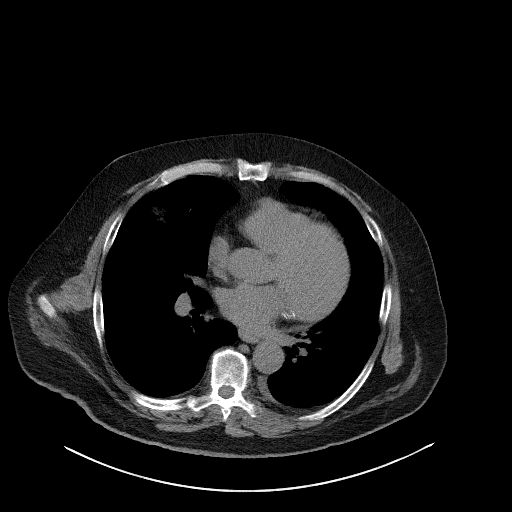
[im 110/132  mediastinal]
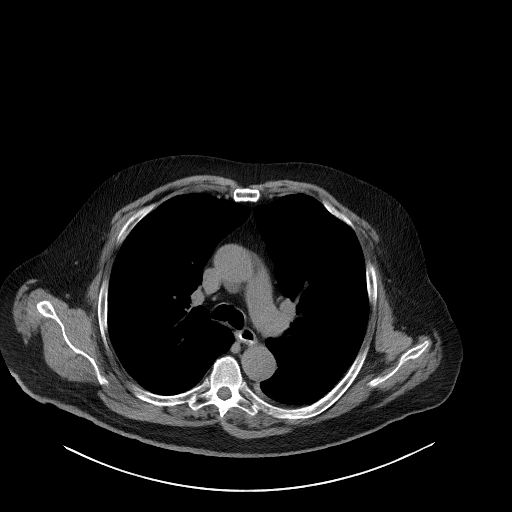
[im 110/132  bone]
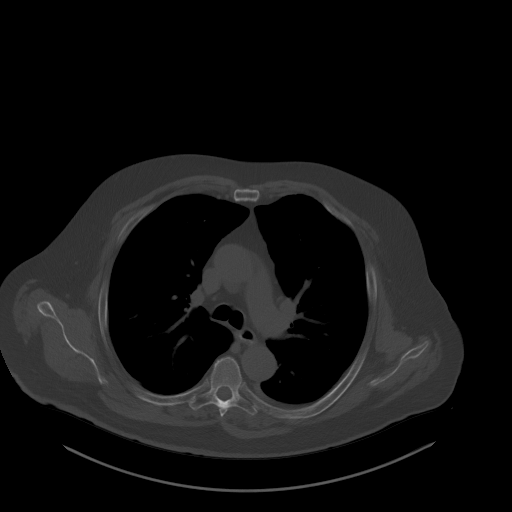
[im 121/132  mediastinal]
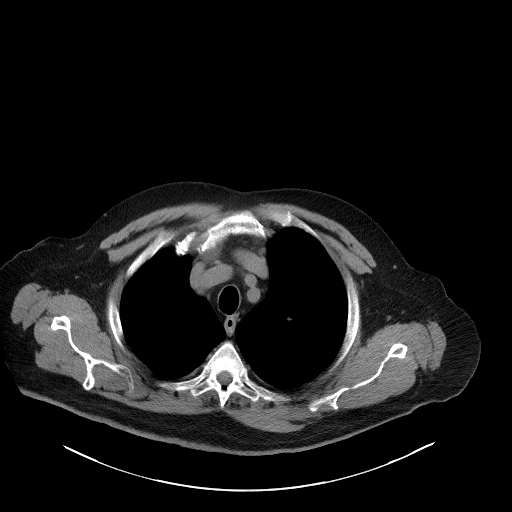

[Series 5: coronal · coronal · 0.82mm/px · 3 of 158 slices shown]
[im 32/158  mediastinal]
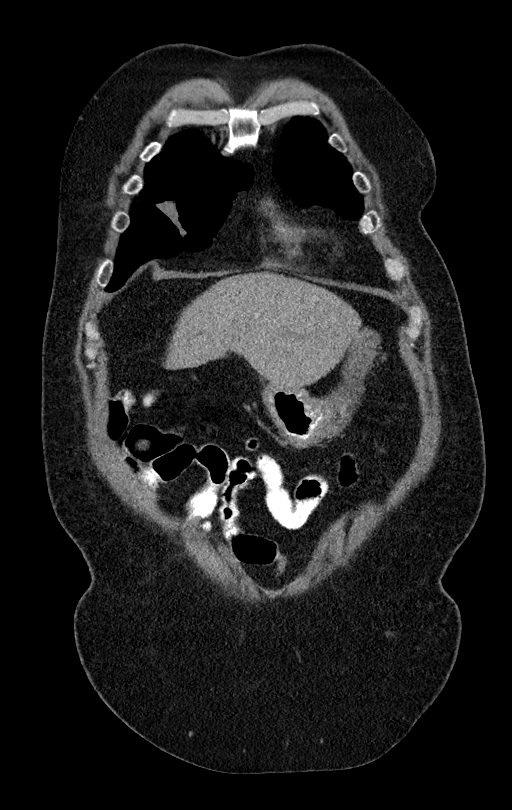
[im 63/158  mediastinal]
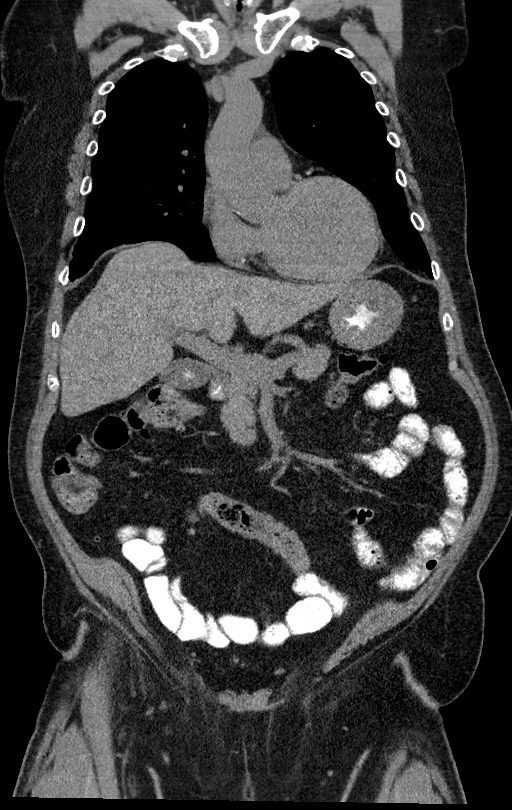
[im 95/158  mediastinal]
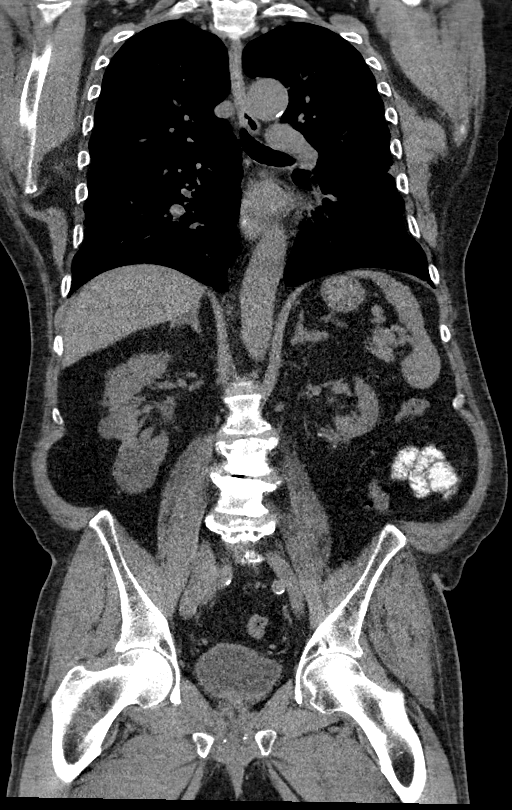

[13 of 36 positions shown; findings below may reference images not displayed]

FINDINGS: CT CHEST FINDINGS

Cardiovascular: Scattered aortic atherosclerosis. Normal heart size.
Three-vessel coronary artery calcifications. No pericardial
effusion.

Mediastinum/Nodes: No enlarged mediastinal, hilar, or axillary lymph
nodes. Thyroid gland, trachea, and esophagus demonstrate no
significant findings.

Lungs/Pleura: Mild centrilobular and paraseptal emphysema. There is
a new nodule or consolidation of the posterior peripheral lingula
abutting the fissure, measuring 2.9 x 2.8 cm (series 4, image 72).
There is a new, flattened subpleural nodular consolidation of the
anterior medial segment right middle lobe, with some evidence of
central clearing, measuring 4.8 x 3.4 cm (series 4, image 86). No
pleural effusion or pneumothorax.

Musculoskeletal: No chest wall mass or suspicious bone lesions
identified.

CT ABDOMEN PELVIS FINDINGS

Hepatobiliary: No solid liver abnormality is seen. Rim calcified
gallstone in the dependent gallbladder. No gallbladder wall
thickening or biliary dilatation.

Pancreas: Unremarkable. No pancreatic ductal dilatation or
surrounding inflammatory changes.

Spleen: Normal in size without significant abnormality.

Adrenals/Urinary Tract: Adrenal glands are unremarkable. Interval
decrease in size of a slightly hyperattenuating nodule of the
inferior pole of the left kidney, measuring approximately 1.9 x
cm, previously 2.7 x 2.1 cm (series 2, image 71). Bladder is
unremarkable.

Stomach/Bowel: Stomach is within normal limits. Appendix appears
normal. No evidence of bowel wall thickening, distention, or
inflammatory changes. Pancolonic diverticulosis.

Vascular/Lymphatic: Aortic atherosclerosis. Significant interval
decrease in size in enlarged left retroperitoneal lymph nodes,
largest node measuring 1.5 x 1.2 cm, previously 3.0 x 2.7 cm (series
2, image 71).

Reproductive: Mild prostatomegaly.

Other: Fat containing bilateral inguinal hernias. No abdominopelvic
ascites. Soft tissue nodules about the left retroperitoneum and left
paracolic gutter are stable or diminished compared to prior
examination, for example a nodule adjacent to the inferior pole of
the left kidney and adjacent descending colon, which is difficult to
discretely appreciate on current examination although new measuring
no greater than 0.5 cm, previously 1.0 cm (series 2, image 71).
Additional nodules of the left retroperitoneum and paracolic gutter
are stable (series 2, image 95).

Musculoskeletal: No acute or significant osseous findings.
IMPRESSION: 1. Interval decrease in size of a slightly hyperattenuating nodule
of the inferior pole of the left kidney, consistent with treatment
response of locally recurrent disease, better characterized by prior
contrast enhanced MR.
2. Significant interval decrease in size in enlarged left
retroperitoneal lymph nodes, consistent with treatment response of
nodal metastatic disease.
3. Soft tissue nodules about the left retroperitoneum and left
paracolic gutter are stable or diminished compared to prior
examination, consistent with stable or improved soft tissue
metastases.
4. There is a new nodule or consolidation of the posterior
peripheral lingula abutting the fissure, measuring 2.9 x 2.8 cm, as
well as a new, flattened subpleural nodular consolidation of the
anterior medial segment right middle lobe, with some evidence of
central clearing, measuring 4.8 x 3.4 cm. Although lung metastases
are a significant differential consideration, morphologically, these
findings do not favor metastasis and are suggestive of pulmonary
infarctions. Consider CT angiogram to exclude pulmonary embolism.
5. Cholelithiasis.
6. Emphysema.
7. Coronary artery disease.

These results will be called to the ordering clinician or
representative by the Radiologist Assistant, and communication
documented in the PACS or [REDACTED].

Aortic Atherosclerosis (692SP-F4M.M) and Emphysema ([id number].G).

## 2023-08-18 IMAGING — US US EXTREM LOW VENOUS
1 series · 13 of 24 positions shown · non-contrast
Comparison: None.

CLINICAL DATA: Evaluate for DVT



[Series 1: us venous img lower bilat (dvt) · portal-venous · 13 of 64 slices shown]
[im 1/64]
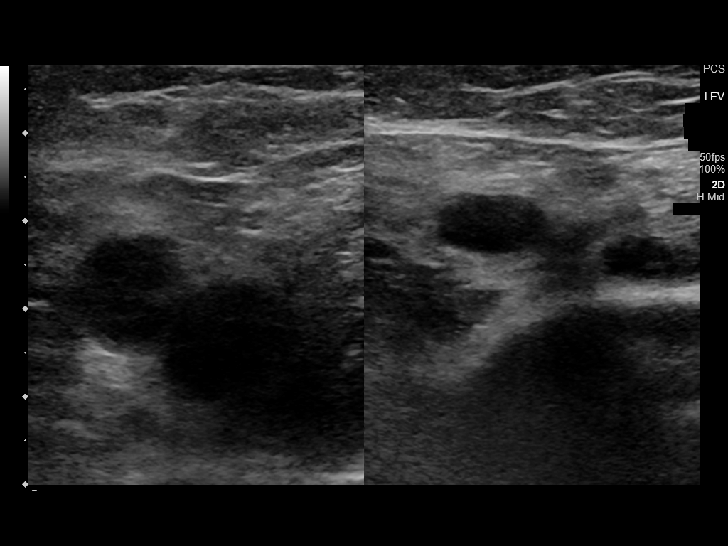
[im 6/64]
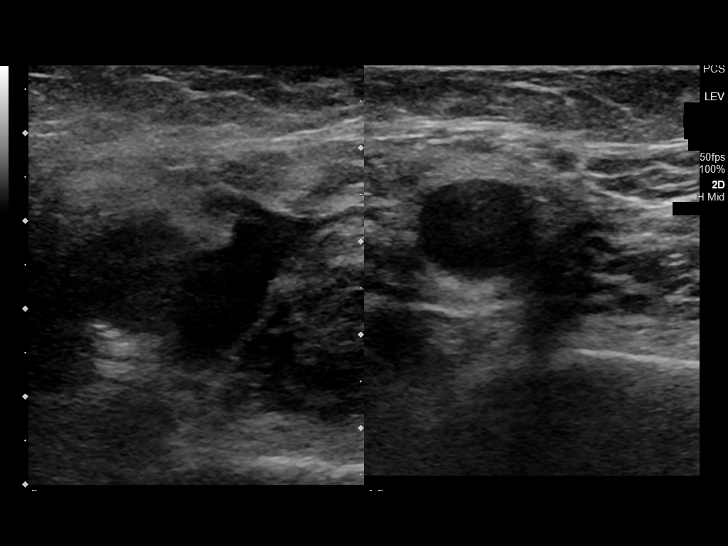
[im 11/64]
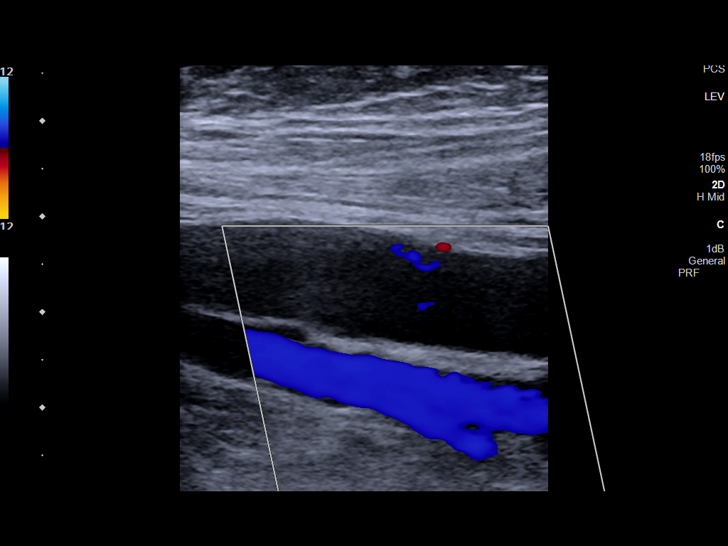
[im 17/64]
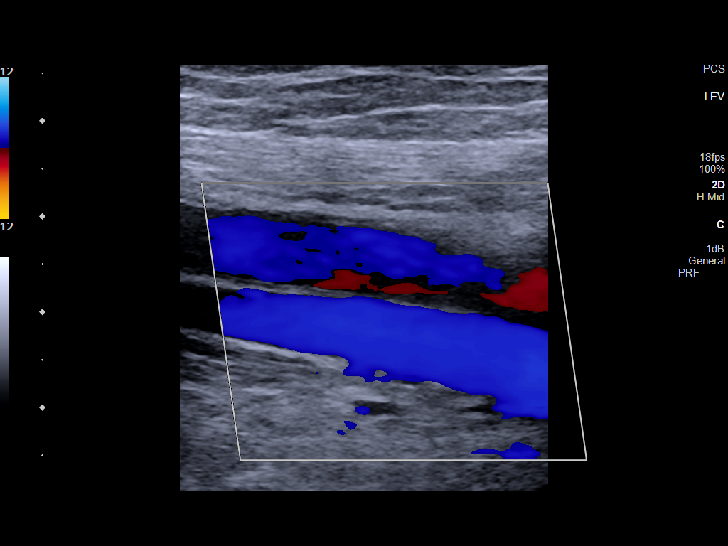
[im 22/64]
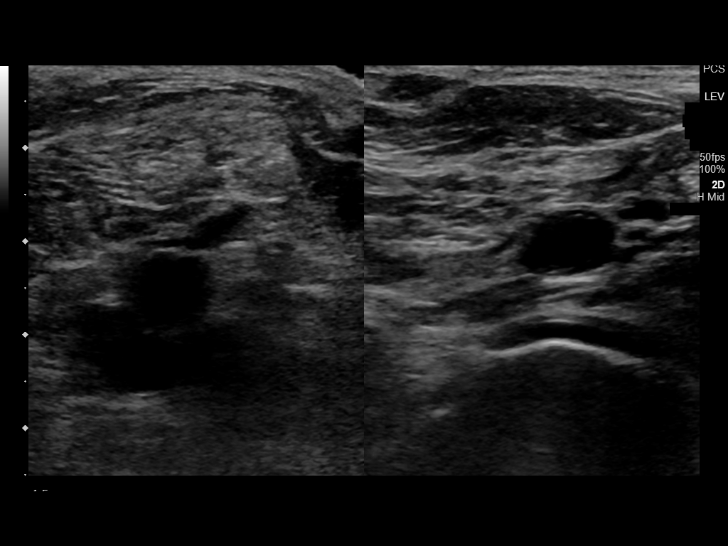
[im 28/64]
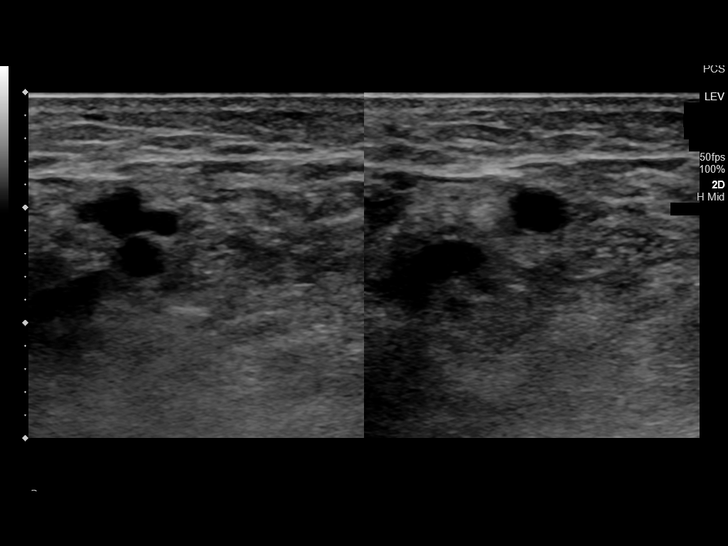
[im 33/64]
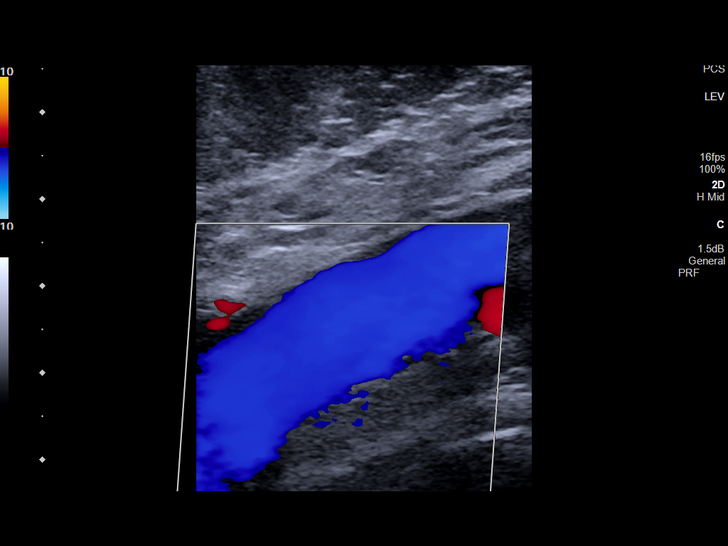
[im 36/64]
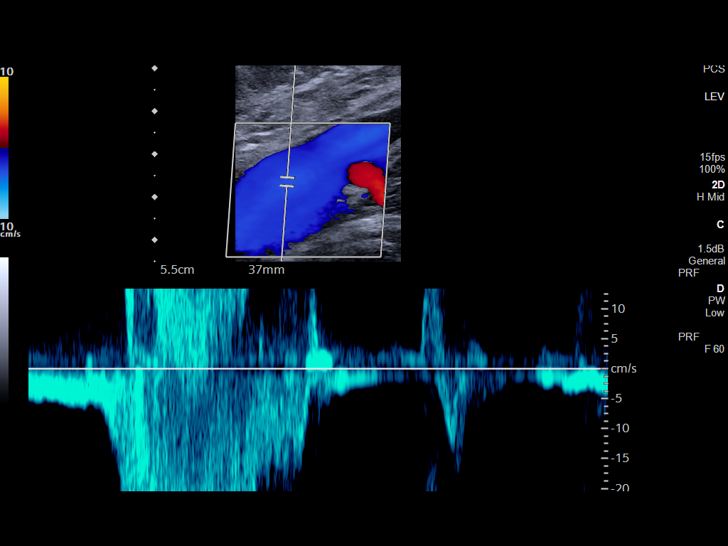
[im 42/64]
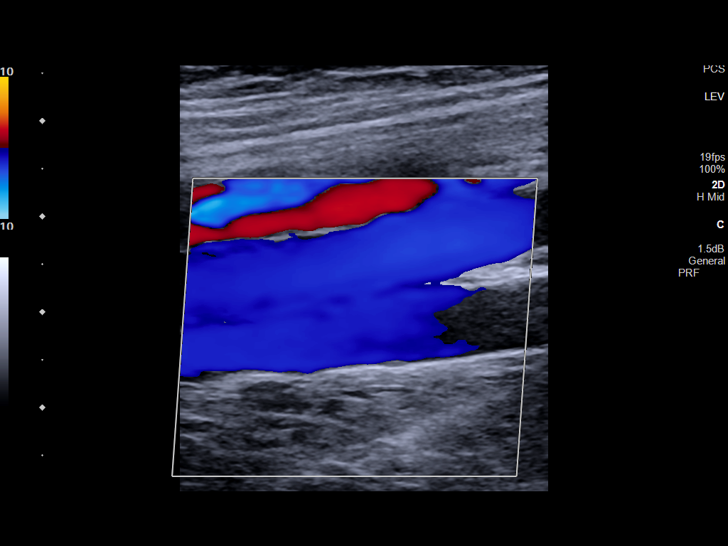
[im 47/64]
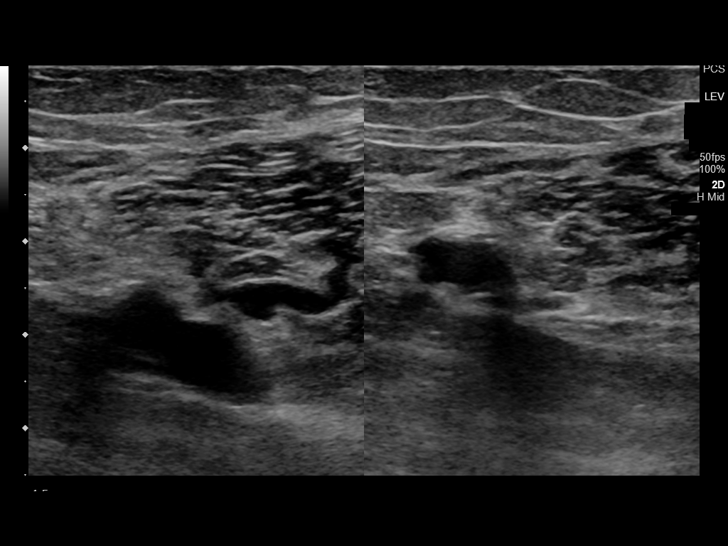
[im 53/64]
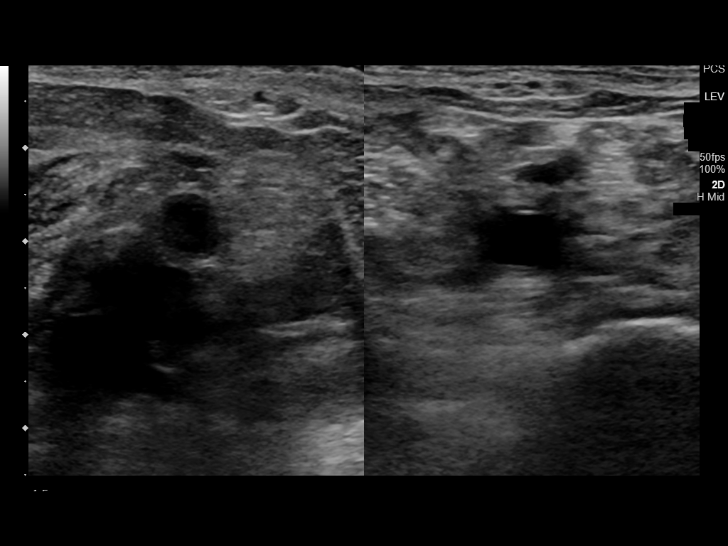
[im 58/64]
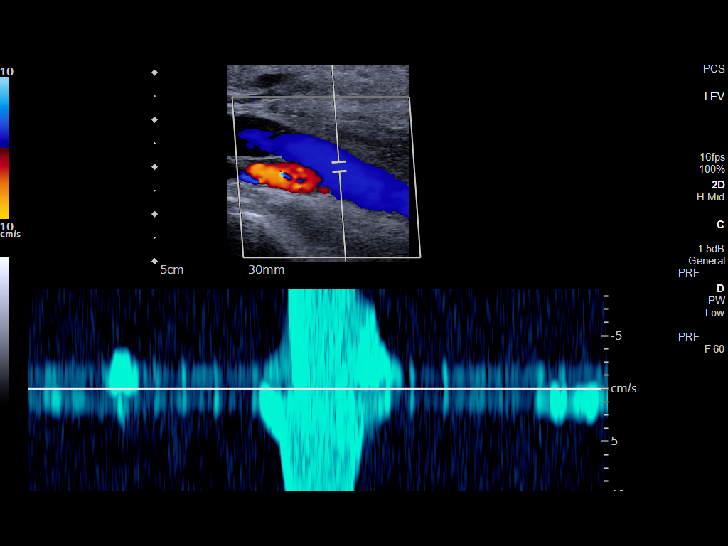
[im 64/64]
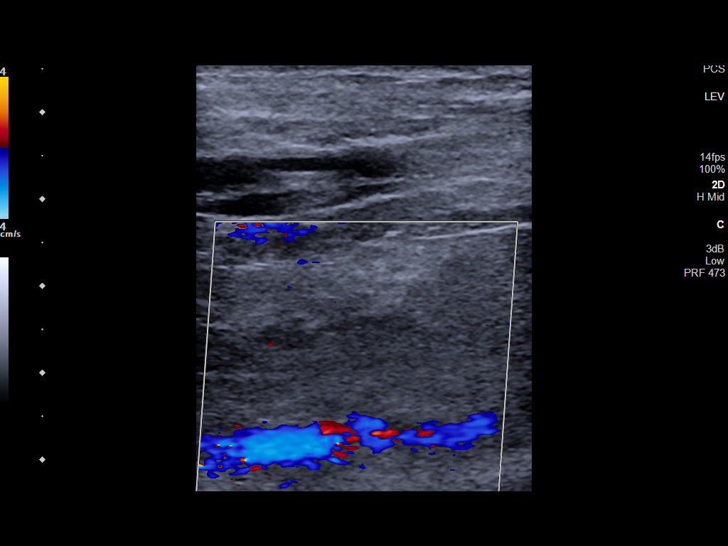

[13 of 24 positions shown; findings below may reference images not displayed]

FINDINGS: RIGHT LOWER EXTREMITY

Common Femoral Vein: No evidence of thrombus. Normal
compressibility, respiratory phasicity and response to augmentation.

Saphenofemoral Junction: No evidence of thrombus. Normal
compressibility and flow on color Doppler imaging.

Profunda Femoral Vein: No evidence of thrombus. Normal
compressibility and flow on color Doppler imaging.

Femoral Vein: No evidence of thrombus. Normal compressibility,
respiratory phasicity and response to augmentation.

Popliteal Vein: No evidence of thrombus. Normal compressibility,
respiratory phasicity and response to augmentation.

Calf Veins: No evidence of thrombus. Normal compressibility and flow
on color Doppler imaging.

Superficial Great Saphenous Vein: No evidence of thrombus. Normal
compressibility.

Venous Reflux:  None.

Other Findings:  None.

LEFT LOWER EXTREMITY

Common Femoral Vein: No evidence of thrombus. Normal
compressibility, respiratory phasicity and response to augmentation.

Saphenofemoral Junction: No evidence of thrombus. Normal
compressibility and flow on color Doppler imaging.

Profunda Femoral Vein: No evidence of thrombus. Normal
compressibility and flow on color Doppler imaging.

Femoral Vein: No evidence of thrombus. Normal compressibility,
respiratory phasicity and response to augmentation.

Popliteal Vein: No evidence of thrombus. Normal compressibility,
respiratory phasicity and response to augmentation.

Calf Veins: No evidence of thrombus. Normal compressibility and flow
on color Doppler imaging. Somewhat limited visualization of the left
peroneal veins due to surgical pin above left ankle.

Superficial Great Saphenous Vein: No evidence of thrombus. Normal
compressibility.

Venous Reflux:  None.

Other Findings:  None.
IMPRESSION: No evidence of deep venous thrombosis in either lower extremity.

Somewhat limited visualization of the left peroneal veins due to
surgical pin above left ankle.

## 2023-08-18 IMAGING — NM NM PULMONARY PERF PARTICULATE
1 series · 8 of 8 positions shown · non-contrast
Comparison: Chest CT 05/04/2021.

CLINICAL DATA: Shortness of breath. History of renal cell
carcinoma. Recent noncontrast chest CT demonstrated lingular and
right middle lobe opacity suspicious for pulmonary infarcts.
Question pulmonary embolism.

EXAM:
NUCLEAR MEDICINE PERFUSION LUNG SCAN
TECHNIQUE: Perfusion images were obtained in multiple projections after
intravenous injection of radiopharmaceutical.
Ventilation scans intentionally deferred if perfusion scan and chest
x-ray adequate for interpretation during COVID 19 epidemic.
RADIOPHARMACEUTICALS:  4.08 mCi 1c-YYm MAA IV

[Series 1000: lung perfusion · 1.95mm/px · 4 acquisitions, 8 frames shown]
[im 1/4]
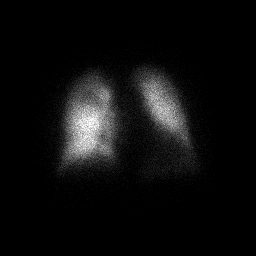
[im 1/4]
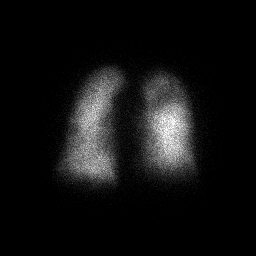
[im 2/4]
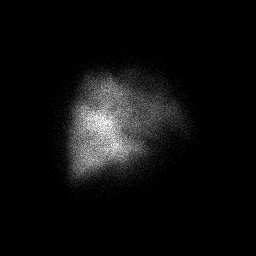
[im 2/4]
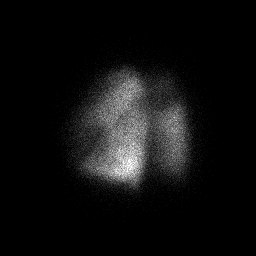
[im 3/4]
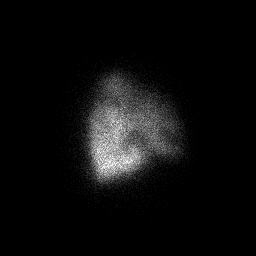
[im 3/4]
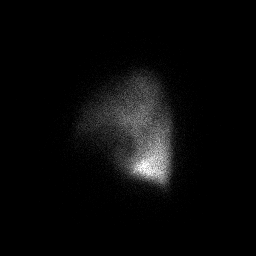
[im 4/4]
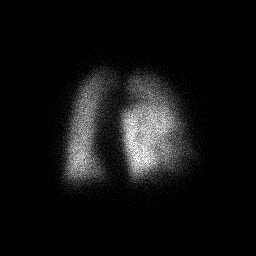
[im 4/4]
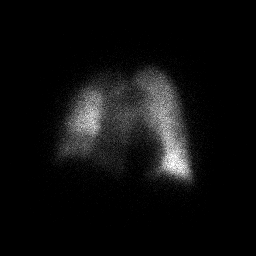

[8 of 8 positions shown; findings below may reference images not displayed]

FINDINGS: The pulmonary perfusion is diffusely heterogeneous with multiple
small to moderate perfusion defects, most notably posteriorly in the
right upper lobe and within the lingula. Without a ventilation scan,
the etiology for these perfusion defects is indeterminate.
IMPRESSION: There are multiple perfusion defects in both lungs which are
indeterminate without a ventilatory examination. Findings could
definitely reflect the sequela of recent pulmonary embolism based on
recent noncontrast CT. If the patient is unable to undergo chest CTA
with contrast, consider further evaluation with lower extremity
Doppler ultrasound to assess for DVT.

These results will be called to the ordering clinician or
representative by the Radiologist Assistant, and communication
documented in the PACS or [REDACTED].

## 2023-08-18 IMAGING — CR DG CHEST 2V
1 series · 2 of 2 positions shown · non-contrast
Comparison: CT 05/04/2021

CLINICAL DATA: Post NM pulmonary profusion

EXAM:
CHEST - 2 VIEW

[Series 1: dg chest 2 view · 0.14mm/px · 2 of 2 slices shown]
[im 1/2]
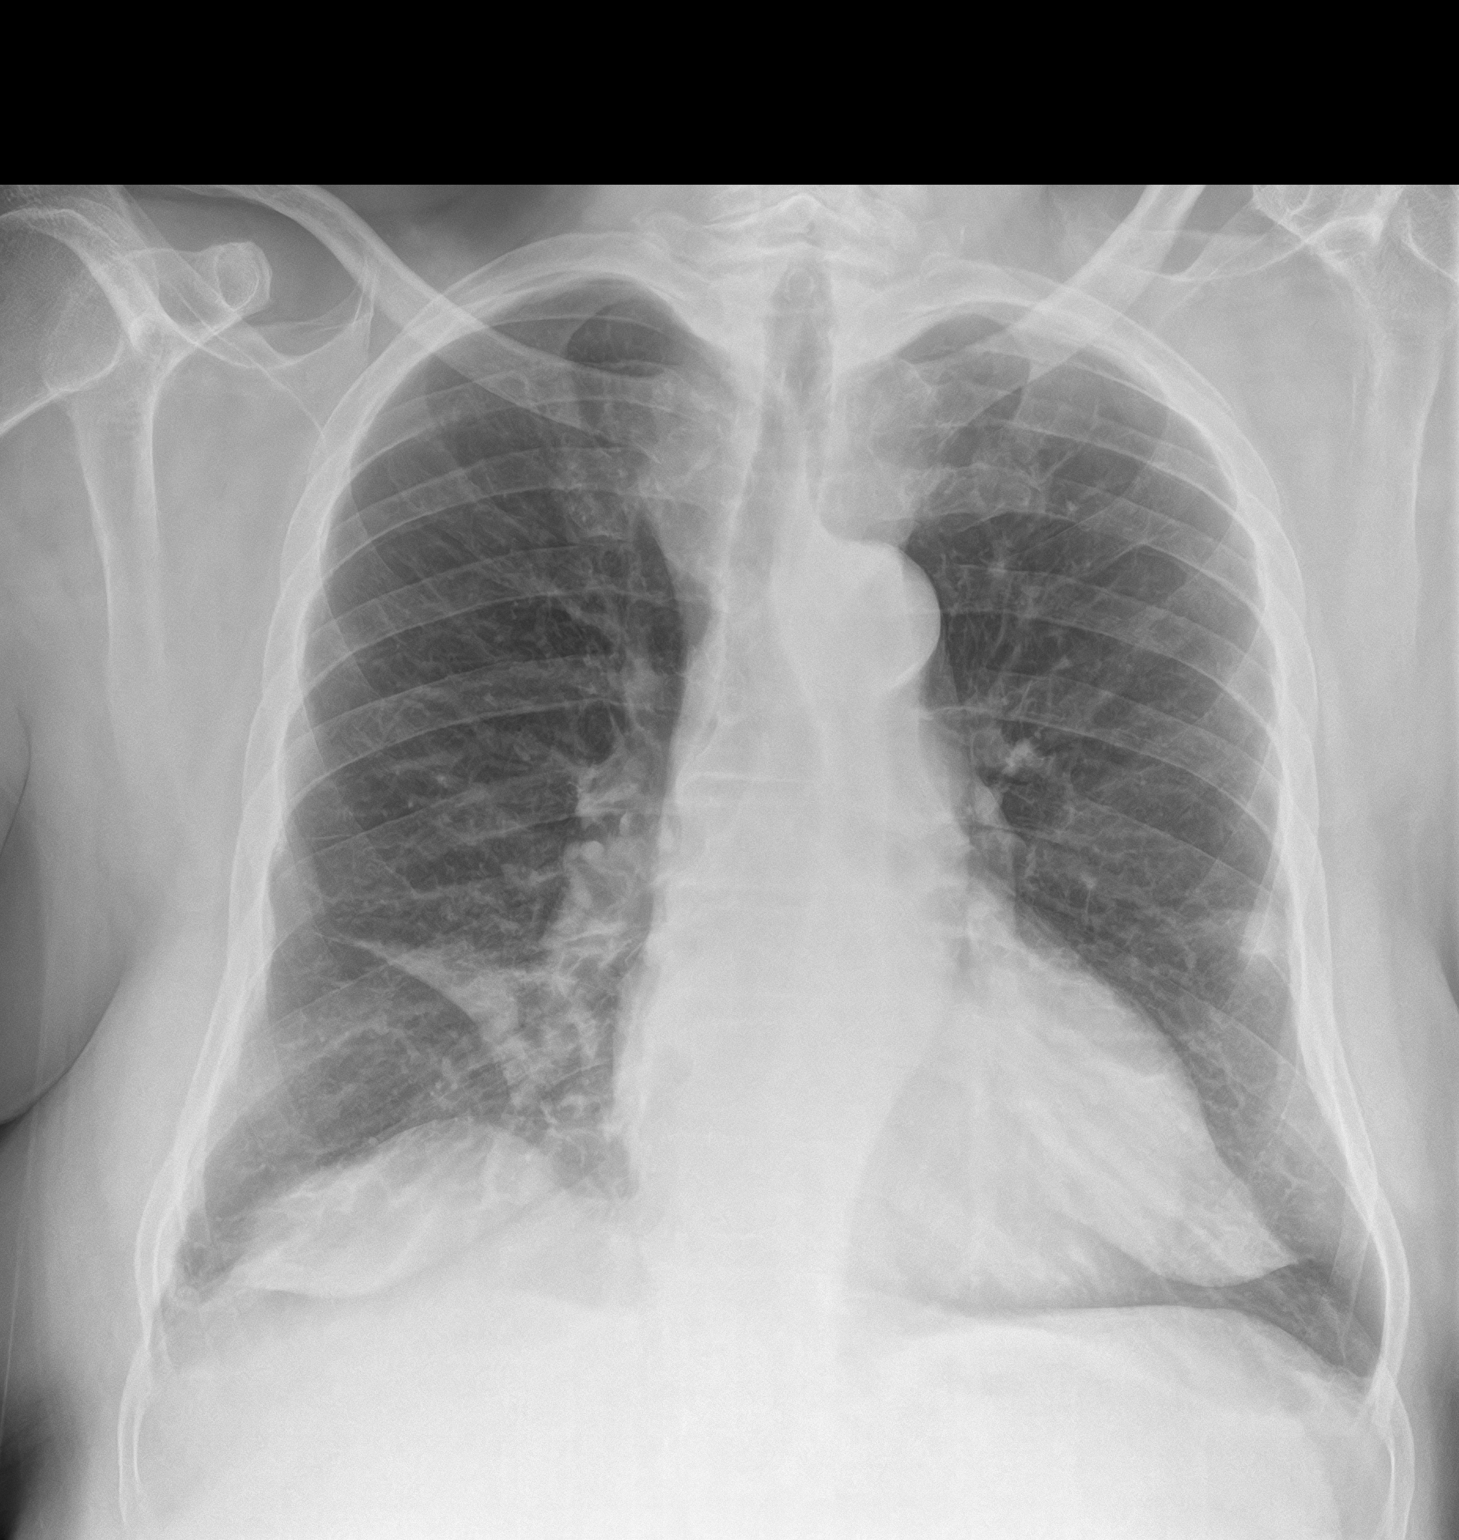
[im 2/2]
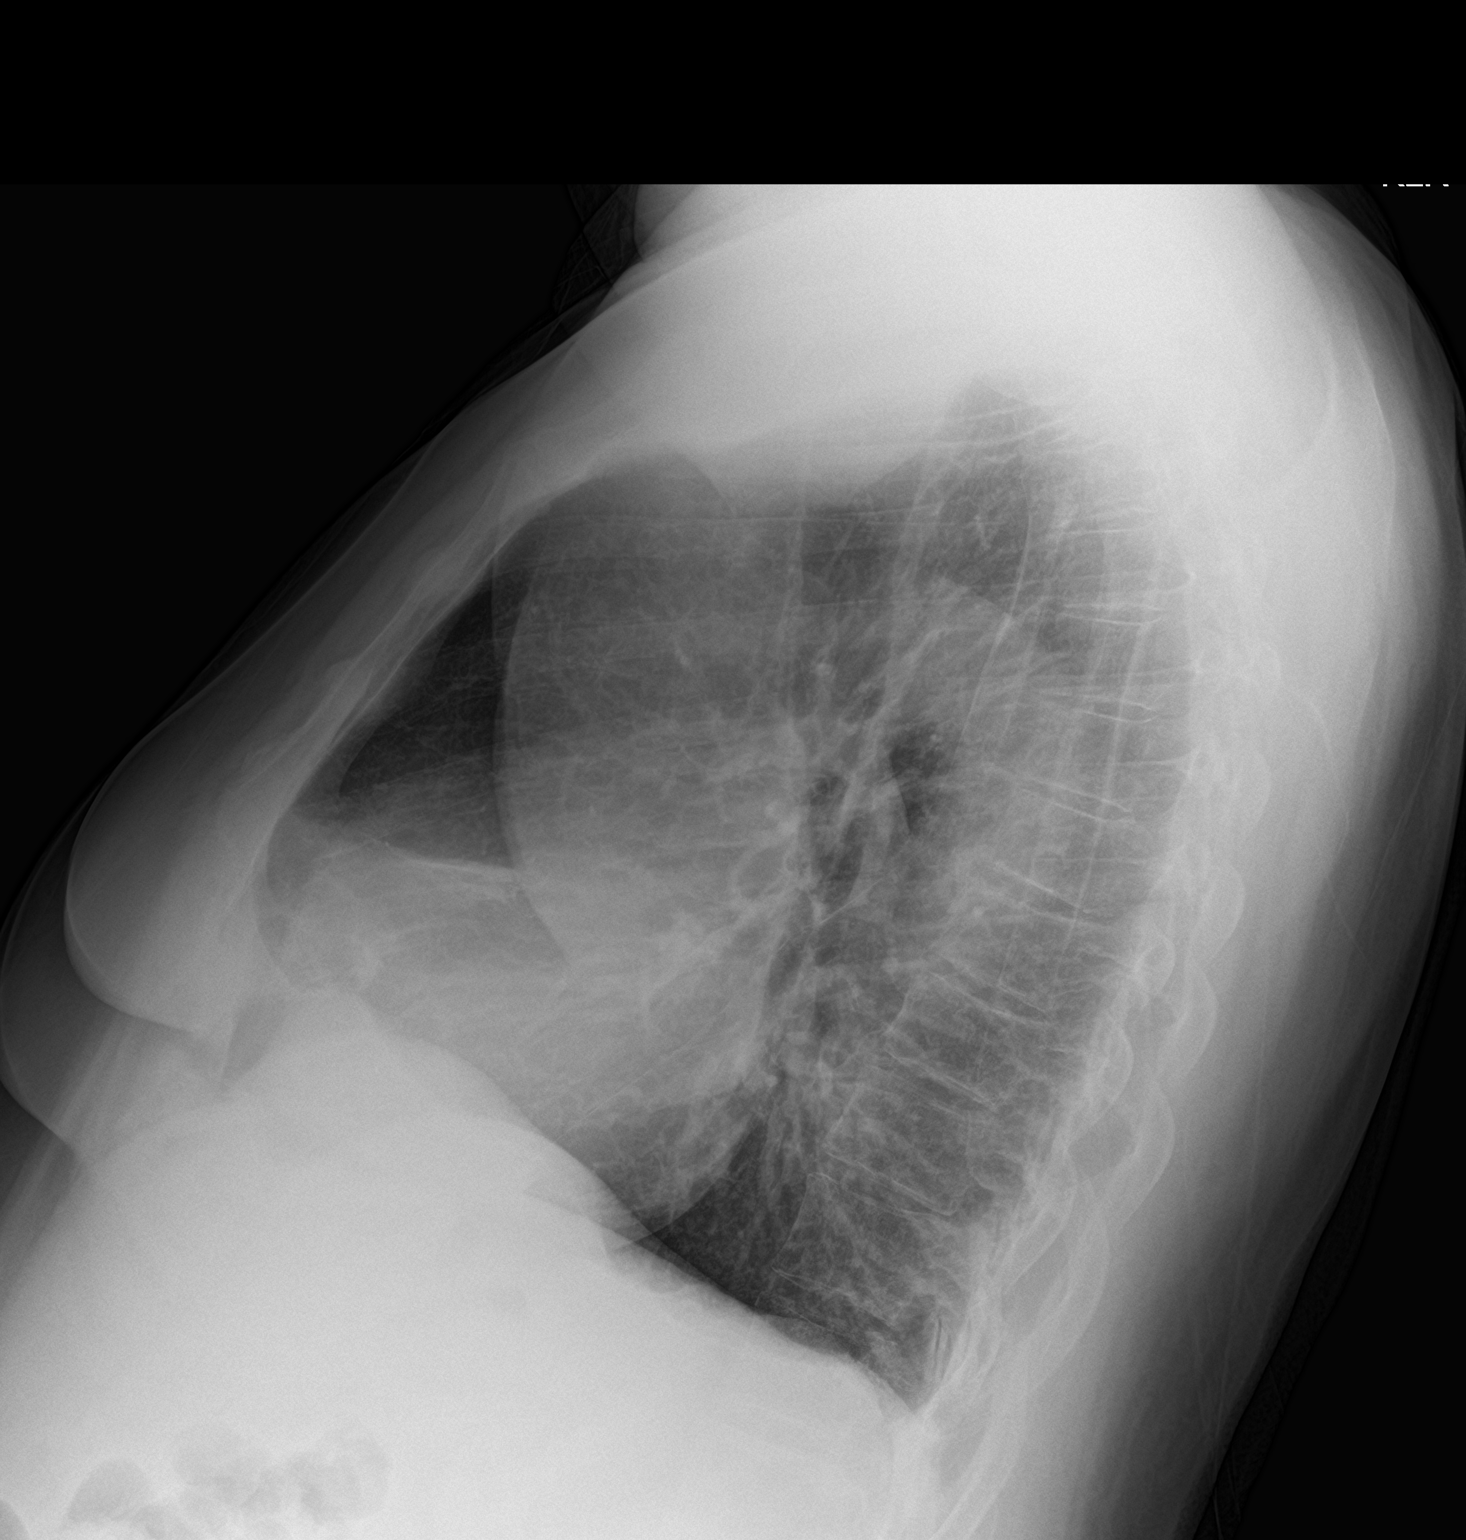

[2 of 2 positions shown; findings below may reference images not displayed]

FINDINGS: Subsegmental atelectasis/consolidation in the right middle lobe.
Nodular opacity laterally in the lingula as seen on previous CT. No
new airspace opacity. No overt edema.

Heart size and mediastinal contours are within normal limits.

No effusion.  No pneumothorax.

Visualized bones unremarkable.
IMPRESSION: 1. Lingular nodular opacity and right middle lobe
atelectasis/consolidation, without convincing change compared to
prior CT.
